# Patient Record
Sex: Female | Born: 1945 | Race: White | Hispanic: No | Marital: Married | Smoking: Light tobacco smoker
Health system: Southern US, Community
[De-identification: ages and names within clinical notes are randomized; demographics above are authoritative.]

## PROBLEM LIST (undated history)

## (undated) DIAGNOSIS — F32A Depression, unspecified: Secondary | ICD-10-CM

## (undated) DIAGNOSIS — F419 Anxiety disorder, unspecified: Secondary | ICD-10-CM

## (undated) DIAGNOSIS — F329 Major depressive disorder, single episode, unspecified: Secondary | ICD-10-CM

## (undated) DIAGNOSIS — I739 Peripheral vascular disease, unspecified: Secondary | ICD-10-CM

## (undated) DIAGNOSIS — I10 Essential (primary) hypertension: Secondary | ICD-10-CM

## (undated) DIAGNOSIS — R011 Cardiac murmur, unspecified: Secondary | ICD-10-CM

## (undated) DIAGNOSIS — I219 Acute myocardial infarction, unspecified: Secondary | ICD-10-CM

## (undated) DIAGNOSIS — E785 Hyperlipidemia, unspecified: Secondary | ICD-10-CM

## (undated) DIAGNOSIS — G5600 Carpal tunnel syndrome, unspecified upper limb: Secondary | ICD-10-CM

## (undated) DIAGNOSIS — M1990 Unspecified osteoarthritis, unspecified site: Secondary | ICD-10-CM

## (undated) DIAGNOSIS — I639 Cerebral infarction, unspecified: Secondary | ICD-10-CM

## (undated) DIAGNOSIS — K219 Gastro-esophageal reflux disease without esophagitis: Secondary | ICD-10-CM

## (undated) DIAGNOSIS — K2270 Barrett's esophagus without dysplasia: Secondary | ICD-10-CM

## (undated) HISTORY — DX: Hyperlipidemia, unspecified: E78.5

## (undated) HISTORY — PX: MULTIPLE TOOTH EXTRACTIONS: SHX2053

## (undated) HISTORY — PX: COLON SURGERY: SHX602

## (undated) HISTORY — DX: Cerebral infarction, unspecified: I63.9

## (undated) HISTORY — PX: TONSILLECTOMY: SUR1361

## (undated) HISTORY — DX: Barrett's esophagus without dysplasia: K22.70

## (undated) HISTORY — PX: CARDIAC CATHETERIZATION: SHX172

## (undated) HISTORY — DX: Cardiac murmur, unspecified: R01.1

## (undated) HISTORY — PX: CARDIAC VALVE REPLACEMENT: SHX585

## (undated) HISTORY — PX: TOTAL ABDOMINAL HYSTERECTOMY: SHX209

## (undated) HISTORY — PX: EYE SURGERY: SHX253

## (undated) HISTORY — PX: EXCISIONAL HEMORRHOIDECTOMY: SHX1541

## (undated) HISTORY — DX: Anxiety disorder, unspecified: F41.9

## (undated) HISTORY — PX: ABDOMINAL HYSTERECTOMY: SHX81

## (undated) HISTORY — DX: Depression, unspecified: F32.A

## (undated) HISTORY — PX: HERNIA REPAIR: SHX51

## (undated) HISTORY — PX: DENTAL SURGERY: SHX609

## (undated) HISTORY — DX: Essential (primary) hypertension: I10

## (undated) HISTORY — PX: CHOLECYSTECTOMY: SHX55

---
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
## 2005-07-22 HISTORY — PX: BREAST BIOPSY: SHX20

## 2006-07-22 HISTORY — PX: CORONARY ANGIOPLASTY WITH STENT PLACEMENT: SHX49

## 2016-05-17 ENCOUNTER — Encounter: Payer: Self-pay | Admitting: Cardiovascular Disease

## 2016-05-27 ENCOUNTER — Encounter: Payer: Self-pay | Admitting: Cardiovascular Disease

## 2019-02-05 ENCOUNTER — Ambulatory Visit: Payer: Federal, State, Local not specified - PPO | Admitting: Family Medicine

## 2019-02-05 ENCOUNTER — Encounter: Payer: Self-pay | Admitting: Family Medicine

## 2019-02-05 ENCOUNTER — Other Ambulatory Visit: Payer: Self-pay

## 2019-02-05 VITALS — BP 146/83 | HR 83 | Temp 98.1°F

## 2019-02-05 DIAGNOSIS — I10 Essential (primary) hypertension: Secondary | ICD-10-CM | POA: Diagnosis not present

## 2019-02-05 DIAGNOSIS — E782 Mixed hyperlipidemia: Secondary | ICD-10-CM | POA: Diagnosis not present

## 2019-02-05 DIAGNOSIS — F419 Anxiety disorder, unspecified: Secondary | ICD-10-CM | POA: Insufficient documentation

## 2019-02-05 DIAGNOSIS — M21619 Bunion of unspecified foot: Secondary | ICD-10-CM

## 2019-02-05 DIAGNOSIS — Z1159 Encounter for screening for other viral diseases: Secondary | ICD-10-CM

## 2019-02-05 DIAGNOSIS — E538 Deficiency of other specified B group vitamins: Secondary | ICD-10-CM

## 2019-02-05 DIAGNOSIS — Z952 Presence of prosthetic heart valve: Secondary | ICD-10-CM

## 2019-02-05 DIAGNOSIS — K589 Irritable bowel syndrome without diarrhea: Secondary | ICD-10-CM

## 2019-02-05 DIAGNOSIS — G5602 Carpal tunnel syndrome, left upper limb: Secondary | ICD-10-CM

## 2019-02-05 DIAGNOSIS — G5600 Carpal tunnel syndrome, unspecified upper limb: Secondary | ICD-10-CM | POA: Insufficient documentation

## 2019-02-05 DIAGNOSIS — E785 Hyperlipidemia, unspecified: Secondary | ICD-10-CM | POA: Insufficient documentation

## 2019-02-05 DIAGNOSIS — M170 Bilateral primary osteoarthritis of knee: Secondary | ICD-10-CM

## 2019-02-05 MED ORDER — OMEPRAZOLE 40 MG PO CPDR: DELAYED_RELEASE_CAPSULE | ORAL | 1 refills | Status: DC

## 2019-02-05 MED ORDER — SERTRALINE HCL 50 MG PO TABS: ORAL_TABLET | ORAL | 3 refills | Status: DC

## 2019-02-05 MED ORDER — ONDANSETRON HCL 4 MG PO TABS: ORAL_TABLET | ORAL | 2 refills | Status: DC

## 2019-02-05 MED ORDER — FENOFIBRATE MICRONIZED 200 MG PO CAPS: 200.0000 mg | ORAL_CAPSULE | Freq: Every day | ORAL | 1 refills | Status: DC

## 2019-02-05 MED ORDER — LISINOPRIL 40 MG PO TABS: 40.0000 mg | ORAL_TABLET | Freq: Every day | ORAL | 3 refills | Status: DC

## 2019-02-05 MED ORDER — ISOSORBIDE MONONITRATE ER 30 MG PO TB24: 30.0000 mg | ORAL_TABLET | Freq: Every day | ORAL | 3 refills | Status: DC

## 2019-02-05 MED ORDER — ROSUVASTATIN CALCIUM 10 MG PO TABS: 10.0000 mg | ORAL_TABLET | Freq: Every day | ORAL | 3 refills | Status: DC

## 2019-02-05 NOTE — Assessment & Plan Note (Addendum)
PMP reviewed. Has not had a Rx for xanax since 11/23/17 for 90 pills. Discussed that we do not prescribe controlled substances on the first visit and that I do not prescribe TID benzos without the clearance of a psychiatrist. She discussed that she didn't want to take xanax any more and had done well with zoloft and citalopram in the past. Will start zoloft and recheck 2 weeks for tolerance. Call with any concerns.

## 2019-02-05 NOTE — Progress Notes (Signed)
BP (!) 146/83   Pulse 83   Temp 98.1 F (36.7 C) (Oral)   SpO2 99%    Subjective:    Patient ID: Tara [name], female    DOB: 10/21/1945, 73 y.o.   MRN: [id number]  HPI: Tara [name] is a 73 y.o. female who presents today to establish care  Chief Complaint  Patient presents with  . Establish Care  . Referral    requesting multiple referrals   ANXIETY/DEPRESSION- has been under a lot of stress recently, her son passed away about 5 years ago. Moved here in 2018. Her husband has fallen 2x and has several fractures. Duration:exacerbated Anxious mood: yes  Excessive worrying: yes Irritability: yes  Sweating: no Nausea: yes Palpitations:no Hyperventilation: no Panic attacks: no Agoraphobia: no  Obscessions/compulsions: yes Depressed mood: yes Depression screen PHQ 2/9 02/05/2019  Decreased Interest 2  Down, Depressed, Hopeless 2  PHQ - 2 Score 4  Altered sleeping 3  Tired, decreased energy 2  Change in appetite 3  Feeling bad or failure about yourself  2  Trouble concentrating 1  Moving slowly or fidgety/restless 2  Suicidal thoughts 0  PHQ-9 Score 17  Difficult doing work/chores Very difficult   GAD 7 : Generalized Anxiety Score 02/05/2019  Nervous, Anxious, on Edge 3  Control/stop worrying 3  Worry too much - different things 3  Trouble relaxing 3  Restless 2  Easily annoyed or irritable 3  Afraid - awful might happen 1  Total GAD 7 Score 18  Anxiety Difficulty Very difficult   Anhedonia: no Weight changes: no Insomnia: yes hard to fall asleep  Hypersomnia: no Fatigue/loss of energy: yes Feelings of worthlessness: yes Feelings of guilt: yes Impaired concentration/indecisiveness: yes Suicidal ideations: no  Crying spells: yes Recent Stressors/Life Changes: yes   Relationship problems: yes   Family stress: yes     Financial stress: no    Job stress: no    Recent death/loss: yes  HYPERTENSION / HYPERLIPIDEMIA- has been off all her medicines for 18  months Satisfied with current treatment? yes Duration of hypertension: chronic 20+ years BP monitoring frequency: not checking BP medication side effects: no Past BP meds: amlodipine, imdur, lisinopril Duration of hyperlipidemia: chronic Cholesterol medication side effects: no Cholesterol supplements: none Past cholesterol medications: atorvastatin, fenofibrate Medication compliance: poor compliance Aspirin: yes Recent stressors: yes Recurrent headaches: yes Visual changes: yes Palpitations: no Dyspnea: no Chest pain: no Lower extremity edema: no Dizzy/lightheaded: no   Active Ambulatory Problems    Diagnosis Date Noted  . Anxiety 02/05/2019  . HTN (hypertension) 02/05/2019  . Hyperlipidemia 02/05/2019  . IBS (irritable bowel syndrome) 02/05/2019  . Arthritis of both knees 02/05/2019  . CTS (carpal tunnel syndrome) 02/05/2019  . History of aortic valve replacement 02/07/2019  . B12 deficiency 02/07/2019   Resolved Ambulatory Problems    Diagnosis Date Noted  . No Resolved Ambulatory Problems   Past Medical History:  Diagnosis Date  . Barrett's syndrome   . Depression   . Heart murmur   . Hypertension    Past Surgical History:  Procedure Laterality Date  . ABDOMINAL HYSTERECTOMY    . CARDIAC VALVE REPLACEMENT    . COLON SURGERY    . EYE SURGERY    . HERNIA REPAIR     Outpatient Encounter Medications as of 02/05/2019  Medication Sig  . ALPRAZolam (XANAX) 0.5 MG tablet Take 0.5 mg by mouth 3 (three) times daily.  Marland Kitchen. amLODipine (NORVASC) 10 MG tablet Take 1 tablet by  mouth daily.  Marland Kitchen aspirin EC 325 MG tablet Take 1 tablet by mouth daily.  Marland Kitchen atorvastatin (LIPITOR) 10 MG tablet Take 1 mg by mouth daily.  . fenofibrate micronized (LOFIBRA) 200 MG capsule Take 1 capsule (200 mg total) by mouth daily.  . isosorbide mononitrate (IMDUR) 30 MG 24 hr tablet Take 1 tablet (30 mg total) by mouth daily.  Marland Kitchen lisinopril (ZESTRIL) 40 MG tablet Take 1 tablet (40 mg total) by mouth  daily.  Marland Kitchen loperamide (IMODIUM) 2 MG capsule Take 2 mg by mouth as needed for diarrhea or loose stools.  Marland Kitchen omeprazole (PRILOSEC) 40 MG capsule TK ONE C PO BID  . ondansetron (ZOFRAN) 4 MG tablet TK 1 T PO  Q 6 H PRN  . [DISCONTINUED] amoxicillin-clavulanate (AUGMENTIN) 875-125 MG tablet TK 1 T PO Q 12 H  . [DISCONTINUED] fenofibrate micronized (LOFIBRA) 200 MG capsule Take 200 mg by mouth daily.  . [DISCONTINUED] isosorbide mononitrate (IMDUR) 30 MG 24 hr tablet TK 1 T PO BID IN THE MORNING.  . [DISCONTINUED] lisinopril (ZESTRIL) 40 MG tablet Take 1 tablet by mouth 2 (two) times daily.  . [DISCONTINUED] omeprazole (PRILOSEC) 40 MG capsule TK ONE C PO BID  . [DISCONTINUED] ondansetron (ZOFRAN) 4 MG tablet TK 1 T PO  Q 6 H PRN  . rosuvastatin (CRESTOR) 10 MG tablet Take 1 tablet (10 mg total) by mouth daily.  . sertraline (ZOLOFT) 50 MG tablet 1 tab daily for 1 week, then increase to 2 tabs daily   No facility-administered encounter medications on file as of 02/05/2019.    No Known Allergies  Social History   Socioeconomic History  . Marital status: Not on file    Spouse name: Not on file  . Number of children: Not on file  . Years of education: Not on file  . Highest education level: Not on file  Occupational History  . Not on file  Social Needs  . Financial resource strain: Not on file  . Food insecurity    Worry: Not on file    Inability: Not on file  . Transportation needs    Medical: Not on file    Non-medical: Not on file  Tobacco Use  . Smoking status: Light Tobacco Smoker  . Smokeless tobacco: Never Used  Substance and Sexual Activity  . Alcohol use: Never    Frequency: Never  . Drug use: Never  . Sexual activity: Not on file  Lifestyle  . Physical activity    Days per week: Not on file    Minutes per session: Not on file  . Stress: Not on file  Relationships  . Social Musician on phone: Not on file    Gets together: Not on file    Attends religious  service: Not on file    Active member of club or organization: Not on file    Attends meetings of clubs or organizations: Not on file    Relationship status: Not on file  Other Topics Concern  . Not on file  Social History Narrative  . Not on file   Family History  Adopted: Yes  Family history unknown: Yes   Review of Systems  Constitutional: Negative.   HENT: Negative.   Respiratory: Negative.   Cardiovascular: Negative.   Gastrointestinal: Positive for abdominal pain and diarrhea. Negative for abdominal distention, anal bleeding, blood in stool, constipation, nausea, rectal pain and vomiting.  Genitourinary: Negative.   Musculoskeletal: Positive for arthralgias and myalgias.  Negative for back pain, gait problem, joint swelling, neck pain and neck stiffness.  Skin: Negative.   Neurological: Positive for numbness. Negative for dizziness, tremors, seizures, syncope, facial asymmetry, speech difficulty, weakness, light-headedness and headaches.  Hematological: Negative.   Psychiatric/Behavioral: Negative for agitation, behavioral problems, confusion, decreased concentration, dysphoric mood, hallucinations, self-injury, sleep disturbance and suicidal ideas. The patient is nervous/anxious. The patient is not hyperactive.     Per HPI unless specifically indicated above     Objective:    BP (!) 146/83   Pulse 83   Temp 98.1 F (36.7 C) (Oral)   SpO2 99%   Wt Readings from Last 3 Encounters:  No data found for Wt    Physical Exam Vitals signs and nursing note reviewed.  Constitutional:      General: She is not in acute distress.    Appearance: Normal appearance. She is not ill-appearing, toxic-appearing or diaphoretic.  HENT:     Head: Normocephalic and atraumatic.     Right Ear: External ear normal.     Left Ear: External ear normal.     Nose: Nose normal.     Mouth/Throat:     Mouth: Mucous membranes are moist.     Pharynx: Oropharynx is clear.  Eyes:     General: No  scleral icterus.       Right eye: No discharge.        Left eye: No discharge.     Extraocular Movements: Extraocular movements intact.     Conjunctiva/sclera: Conjunctivae normal.     Pupils: Pupils are equal, round, and reactive to light.  Neck:     Musculoskeletal: Normal range of motion and neck supple.  Cardiovascular:     Rate and Rhythm: Normal rate and regular rhythm.     Pulses: Normal pulses.     Heart sounds: Normal heart sounds. No murmur. No friction rub. No gallop.   Pulmonary:     Effort: Pulmonary effort is normal. No respiratory distress.     Breath sounds: Normal breath sounds. No stridor. No wheezing, rhonchi or rales.  Chest:     Chest wall: No tenderness.  Musculoskeletal: Normal range of motion.        General: Swelling (R knee with effusion) present.  Skin:    General: Skin is warm and dry.     Capillary Refill: Capillary refill takes less than 2 seconds.     Coloration: Skin is not jaundiced or pale.     Findings: No bruising, erythema, lesion or rash.  Neurological:     General: No focal deficit present.     Mental Status: She is alert and oriented to person, place, and time. Mental status is at baseline.  Psychiatric:        Mood and Affect: Mood normal.        Behavior: Behavior normal.        Thought Content: Thought content normal.        Judgment: Judgment normal.     Results for orders placed or performed in visit on 02/05/19  Microscopic Examination   BLD  Result Value Ref Range   WBC, UA 0-5 0 - 5 /hpf   RBC None seen 0 - 2 /hpf   Epithelial Cells (non renal) >10 (H) 0 - 10 /hpf   Renal Epithel, UA 0-10 (A) None seen /hpf   Casts Present (A) None seen /lpf   Cast Type Hyaline casts N/A   Crystals Present (A) N/A   Crystal  Type Calcium Oxalate N/A   Mucus, UA Present Not Estab.   Bacteria, UA Many (A) None seen/Few  Bayer DCA Hb A1c Waived  Result Value Ref Range   HB A1C (BAYER DCA - WAIVED) 6.3 <7.0 %  CBC with  Differential/Platelet  Result Value Ref Range   WBC 6.2 3.4 - 10.8 x10E3/uL   RBC 5.85 (H) 3.77 - 5.28 x10E6/uL   Hemoglobin 16.4 (H) 11.1 - 15.9 g/dL   Hematocrit 84.648.2 (H) 96.234.0 - 46.6 %   MCV 82 79 - 97 fL   MCH 28.0 26.6 - 33.0 pg   MCHC 34.0 31.5 - 35.7 g/dL   RDW 95.214.2 84.111.7 - 32.415.4 %   Platelets 170 150 - 450 x10E3/uL   Neutrophils 61 Not Estab. %   Lymphs 29 Not Estab. %   Monocytes 9 Not Estab. %   Eos 1 Not Estab. %   Basos 0 Not Estab. %   Neutrophils Absolute 3.8 1.4 - 7.0 x10E3/uL   Lymphocytes Absolute 1.8 0.7 - 3.1 x10E3/uL   Monocytes Absolute 0.5 0.1 - 0.9 x10E3/uL   EOS (ABSOLUTE) 0.1 0.0 - 0.4 x10E3/uL   Basophils Absolute 0.0 0.0 - 0.2 x10E3/uL   Immature Granulocytes 0 Not Estab. %   Immature Grans (Abs) 0.0 0.0 - 0.1 x10E3/uL  Comprehensive metabolic panel  Result Value Ref Range   Glucose 115 (H) 65 - 99 mg/dL   BUN 27 8 - 27 mg/dL   Creatinine, Ser 4.010.89 0.57 - 1.00 mg/dL   GFR calc non Af Amer 66 >59 mL/min/1.73   GFR calc Af Amer 76 >59 mL/min/1.73   BUN/Creatinine Ratio 30 (H) 12 - 28   Sodium 143 134 - 144 mmol/L   Potassium 3.9 3.5 - 5.2 mmol/L   Chloride 106 96 - 106 mmol/L   CO2 23 20 - 29 mmol/L   Calcium 10.3 8.7 - 10.3 mg/dL   Total Protein 6.5 6.0 - 8.5 g/dL   Albumin 4.4 3.8 - 4.8 g/dL   Globulin, Total 2.1 1.5 - 4.5 g/dL   Albumin/Globulin Ratio 2.1 1.2 - 2.2   Bilirubin Total 0.6 0.0 - 1.2 mg/dL   Alkaline Phosphatase 119 (H) 39 - 117 IU/L   AST 20 0 - 40 IU/L   ALT 19 0 - 32 IU/L  Lipid Panel w/o Chol/HDL Ratio  Result Value Ref Range   Cholesterol, Total 217 (H) 100 - 199 mg/dL   Triglycerides 027199 (H) 0 - 149 mg/dL   HDL 40 >25>39 mg/dL   VLDL Cholesterol Cal 40 5 - 40 mg/dL   LDL Calculated 366137 (H) 0 - 99 mg/dL  Microalbumin, Urine Waived  Result Value Ref Range   Microalb, Ur Waived 150 (H) 0 - 19 mg/L   Creatinine, Urine Waived 300 10 - 300 mg/dL   Microalb/Creat Ratio 30-300 (H) <30 mg/g  TSH  Result Value Ref Range   TSH  2.380 0.450 - 4.500 uIU/mL  UA/M w/rflx Culture, Routine   Specimen: Blood   BLD  Result Value Ref Range   Specific Gravity, UA >1.030 (H) 1.005 - 1.030   pH, UA 5.5 5.0 - 7.5   Color, UA Yellow Yellow   Appearance Ur Cloudy (A) Clear   Leukocytes,UA Negative Negative   Protein,UA 3+ (A) Negative/Trace   Glucose, UA Negative Negative   Ketones, UA 1+ (A) Negative   RBC, UA Negative Negative   Bilirubin, UA Negative Negative   Urobilinogen, Ur 1.0 0.2 - 1.0 mg/dL  Nitrite, UA Negative Negative   Microscopic Examination See below:   Hepatitis C Antibody  Result Value Ref Range   Hep C Virus Ab <0.1 0.0 - 0.9 s/co ratio  B12  Result Value Ref Range   Vitamin B-12 167 (L) 232 - 1,245 pg/mL      Assessment & Plan:   Problem List Items Addressed This Visit      Cardiovascular and Mediastinum   HTN (hypertension)    Has been only on her lisinopril (80mg  daily). Will restart her imdur and cut her lisinopril in 1/2 given her borderline blood pressure today (30mg  imdur and 40mg  lisinopril daily) Will recheck BP in 2 weeks and adjust medicine as needed. Checking labs today.      Relevant Medications   atorvastatin (LIPITOR) 10 MG tablet   aspirin EC 325 MG tablet   amLODipine (NORVASC) 10 MG tablet   lisinopril (ZESTRIL) 40 MG tablet   isosorbide mononitrate (IMDUR) 30 MG 24 hr tablet   fenofibrate micronized (LOFIBRA) 200 MG capsule   rosuvastatin (CRESTOR) 10 MG tablet   Other Relevant Orders   Bayer DCA Hb A1c Waived (Completed)   CBC with Differential/Platelet (Completed)   Comprehensive metabolic panel (Completed)   Microalbumin, Urine Waived (Completed)   TSH (Completed)   UA/M w/rflx Culture, Routine (Completed)   Ambulatory referral to Cardiology     Digestive   IBS (irritable bowel syndrome)    Stable on current regimen. Would like to establish with GI down here. Referral generated today.      Relevant Medications   loperamide (IMODIUM) 2 MG capsule    omeprazole (PRILOSEC) 40 MG capsule   ondansetron (ZOFRAN) 4 MG tablet   Other Relevant Orders   Ambulatory referral to Gastroenterology     Nervous and Auditory   CTS (carpal tunnel syndrome)    Chronic. Was supposed to have surgery in [place] before she moved. Will get her into see ortho. Await their input. Referral generated today.      Relevant Medications   ALPRAZolam (XANAX) 0.5 MG tablet   sertraline (ZOLOFT) 50 MG tablet     Musculoskeletal and Integument   Arthritis of both knees    R knee has been swollen and painful. She would like to get it drained. Appointment for orthopedics arranged. Referral generated. Call with any concerns.       Relevant Medications   aspirin EC 325 MG tablet   Other Relevant Orders   Ambulatory referral to Orthopedic Surgery   Ambulatory referral to Rheumatology     Other   Anxiety - Primary    PMP reviewed. Has not had a Rx for xanax since 11/23/17 for 90 pills. Discussed that we do not prescribe controlled substances on the first visit and that I do not prescribe TID benzos without the clearance of a psychiatrist. She discussed that she didn't want to take xanax any more and had done well with zoloft and citalopram in the past. Will start zoloft and recheck 2 weeks for tolerance. Call with any concerns.       Relevant Medications   ALPRAZolam (XANAX) 0.5 MG tablet   sertraline (ZOLOFT) 50 MG tablet   Other Relevant Orders   Comprehensive metabolic panel (Completed)   TSH (Completed)   Hyperlipidemia    Rechecking labs and restarting crestor as atorvastatin was making her achey. Call with any concerns. Continue to monitor. Call with any concerns.       Relevant Medications   atorvastatin (LIPITOR) 10  MG tablet   aspirin EC 325 MG tablet   amLODipine (NORVASC) 10 MG tablet   lisinopril (ZESTRIL) 40 MG tablet   isosorbide mononitrate (IMDUR) 30 MG 24 hr tablet   fenofibrate micronized (LOFIBRA) 200 MG capsule   rosuvastatin (CRESTOR) 10  MG tablet   Other Relevant Orders   Bayer DCA Hb A1c Waived (Completed)   Comprehensive metabolic panel (Completed)   Lipid Panel w/o Chol/HDL Ratio (Completed)   Ambulatory referral to Cardiology   History of aortic valve replacement    Bioprosthetic valve. Would like to get back into see cardiology. Referral generated today.       Relevant Orders   Ambulatory referral to Cardiology   B12 deficiency    Will check labs. Await results. Treat as needed.       Relevant Orders   B12 (Completed)    Other Visit Diagnoses    Need for hepatitis C screening test       Relevant Orders   Hepatitis C Antibody (Completed)   Bunion       Would like to get back in with podiatry. Referral generated today.   Relevant Orders   Ambulatory referral to Podiatry       Follow up plan: Return in about 2 weeks (around 02/19/2019).

## 2019-02-06 LAB — COMPREHENSIVE METABOLIC PANEL
ALT: 19 IU/L (ref 0–32)
AST: 20 IU/L (ref 0–40)
Albumin/Globulin Ratio: 2.1 (ref 1.2–2.2)
Albumin: 4.4 g/dL (ref 3.8–4.8)
Alkaline Phosphatase: 119 IU/L — ABNORMAL HIGH (ref 39–117)
BUN/Creatinine Ratio: 30 — ABNORMAL HIGH (ref 12–28)
BUN: 27 mg/dL (ref 8–27)
Bilirubin Total: 0.6 mg/dL (ref 0.0–1.2)
CO2: 23 mmol/L (ref 20–29)
Calcium: 10.3 mg/dL (ref 8.7–10.3)
Chloride: 106 mmol/L (ref 96–106)
Creatinine, Ser: 0.89 mg/dL (ref 0.57–1.00)
GFR calc Af Amer: 76 mL/min/{1.73_m2} (ref >59)
GFR calc non Af Amer: 66 mL/min/{1.73_m2} (ref >59)
Globulin, Total: 2.1 g/dL (ref 1.5–4.5)
Glucose: 115 mg/dL — ABNORMAL HIGH (ref 65–99)
Potassium: 3.9 mmol/L (ref 3.5–5.2)
Sodium: 143 mmol/L (ref 134–144)
Total Protein: 6.5 g/dL (ref 6.0–8.5)

## 2019-02-06 LAB — UA/M W/RFLX CULTURE, ROUTINE
Bilirubin, UA: NEGATIVE
Glucose, UA: NEGATIVE
Leukocytes,UA: NEGATIVE
Nitrite, UA: NEGATIVE
RBC, UA: NEGATIVE
Specific Gravity, UA: >1.03 — ABNORMAL HIGH (ref 1.005–1.030)
Urobilinogen, Ur: 1 mg/dL (ref 0.2–1.0)
pH, UA: 5.5 (ref 5.0–7.5)

## 2019-02-06 LAB — MICROSCOPIC EXAMINATION
Epithelial Cells (non renal): >10 /hpf — ABNORMAL HIGH (ref 0–10)
RBC, Urine: NONE SEEN /hpf (ref 0–2)

## 2019-02-06 LAB — CBC WITH DIFFERENTIAL/PLATELET
Basophils Absolute: 0 10*3/uL (ref 0.0–0.2)
Basos: 0 %
EOS (ABSOLUTE): 0.1 10*3/uL (ref 0.0–0.4)
Eos: 1 %
Hematocrit: 48.2 % — ABNORMAL HIGH (ref 34.0–46.6)
Hemoglobin: 16.4 g/dL — ABNORMAL HIGH (ref 11.1–15.9)
Immature Grans (Abs): 0 10*3/uL (ref 0.0–0.1)
Immature Granulocytes: 0 %
Lymphocytes Absolute: 1.8 10*3/uL (ref 0.7–3.1)
Lymphs: 29 %
MCH: 28 pg (ref 26.6–33.0)
MCHC: 34 g/dL (ref 31.5–35.7)
MCV: 82 fL (ref 79–97)
Monocytes Absolute: 0.5 10*3/uL (ref 0.1–0.9)
Monocytes: 9 %
Neutrophils Absolute: 3.8 10*3/uL (ref 1.4–7.0)
Neutrophils: 61 %
Platelets: 170 10*3/uL (ref 150–450)
RBC: 5.85 x10E6/uL — ABNORMAL HIGH (ref 3.77–5.28)
RDW: 14.2 % (ref 11.7–15.4)
WBC: 6.2 10*3/uL (ref 3.4–10.8)

## 2019-02-06 LAB — LIPID PANEL W/O CHOL/HDL RATIO
Cholesterol, Total: 217 mg/dL — ABNORMAL HIGH (ref 100–199)
HDL: 40 mg/dL (ref >39)
LDL Calculated: 137 mg/dL — ABNORMAL HIGH (ref 0–99)
Triglycerides: 199 mg/dL — ABNORMAL HIGH (ref 0–149)
VLDL Cholesterol Cal: 40 mg/dL (ref 5–40)

## 2019-02-06 LAB — HEPATITIS C ANTIBODY: Hep C Virus Ab: <0.1 s/co ratio (ref 0.0–0.9)

## 2019-02-06 LAB — TSH: TSH: 2.38 u[IU]/mL (ref 0.450–4.500)

## 2019-02-06 LAB — BAYER DCA HB A1C WAIVED: HB A1C (BAYER DCA - WAIVED): 6.3 % (ref <7.0)

## 2019-02-06 LAB — VITAMIN B12: Vitamin B-12: 167 pg/mL — ABNORMAL LOW (ref 232–1245)

## 2019-02-06 LAB — MICROALBUMIN, URINE WAIVED
Creatinine, Urine Waived: 300 mg/dL (ref 10–300)
Microalb, Ur Waived: 150 mg/L — ABNORMAL HIGH (ref 0–19)

## 2019-02-07 ENCOUNTER — Encounter: Payer: Self-pay | Admitting: Family Medicine

## 2019-02-07 DIAGNOSIS — E538 Deficiency of other specified B group vitamins: Secondary | ICD-10-CM | POA: Insufficient documentation

## 2019-02-07 DIAGNOSIS — Z952 Presence of prosthetic heart valve: Secondary | ICD-10-CM | POA: Insufficient documentation

## 2019-02-07 NOTE — Assessment & Plan Note (Signed)
R knee has been swollen and painful. She would like to get it drained. Appointment for orthopedics arranged. Referral generated. Call with any concerns.

## 2019-02-07 NOTE — Assessment & Plan Note (Signed)
Will check labs. Await results. Treat as needed.  

## 2019-02-07 NOTE — Assessment & Plan Note (Signed)
Has been only on her lisinopril (80mg  daily). Will restart her imdur and cut her lisinopril in 1/2 given her borderline blood pressure today (30mg  imdur and 40mg  lisinopril daily) Will recheck BP in 2 weeks and adjust medicine as needed. Checking labs today.

## 2019-02-07 NOTE — Assessment & Plan Note (Signed)
Rechecking labs and restarting crestor as atorvastatin was making her achey. Call with any concerns. Continue to monitor. Call with any concerns.

## 2019-02-07 NOTE — Assessment & Plan Note (Signed)
Stable on current regimen. Would like to establish with GI down here. Referral generated today.

## 2019-02-07 NOTE — Assessment & Plan Note (Signed)
Bioprosthetic valve. Would like to get back into see cardiology. Referral generated today.

## 2019-02-07 NOTE — Assessment & Plan Note (Signed)
Chronic. Was supposed to have surgery in [place] before she moved. Will get her into see ortho. Await their input. Referral generated today.

## 2019-02-08 ENCOUNTER — Encounter: Payer: Self-pay | Admitting: Family Medicine

## 2019-02-08 ENCOUNTER — Other Ambulatory Visit: Payer: Self-pay | Admitting: Family Medicine

## 2019-02-08 MED ORDER — "INSULIN SYRINGE 27G X 1/2"" 1 ML MISC": 1.0000 | 3 refills | Status: DC

## 2019-02-08 MED ORDER — CYANOCOBALAMIN 1000 MCG/ML IJ SOLN: 1000.0000 ug | INTRAMUSCULAR | 12 refills | Status: DC

## 2019-02-09 ENCOUNTER — Telehealth: Payer: Self-pay | Admitting: Cardiovascular Disease

## 2019-02-09 ENCOUNTER — Telehealth: Payer: Self-pay | Admitting: Family Medicine

## 2019-02-09 DIAGNOSIS — Z01419 Encounter for gynecological examination (general) (routine) without abnormal findings: Secondary | ICD-10-CM

## 2019-02-09 DIAGNOSIS — Z1239 Encounter for other screening for malignant neoplasm of breast: Secondary | ICD-10-CM

## 2019-02-09 NOTE — Telephone Encounter (Signed)
Called to relay message to patient.  Patient states she has been taking medication BID and wants to keep doing so. Wants RX changed.   Pt verbalized that she did not have any issues for the GYN, however she wants a PAP done. Pt states birth mother had Ovarian cancer and wants this checked out before she develops a tumor and it's too late.   Did not relay message about mammogram as pt had become hostile and upset and had stopped listening to message.   Advised I would send message back to provider.

## 2019-02-09 NOTE — Telephone Encounter (Signed)
Will change Rx at follow up in 10 days if necessary.   Referral to GYN placed.

## 2019-02-09 NOTE — Telephone Encounter (Signed)
Virtual Visit Pre-Appointment Phone Call  "(Name), I am calling you today to discuss your upcoming appointment. We are currently trying to limit exposure to the virus that causes COVID-19 by seeing patients at home rather than in the office."  1. "What is the BEST phone number to call the day of the visit?" - include this in appointment notes  2. Do you have or have access to (through a family member/friend) a smartphone with video capability that we can use for your visit?" a. If yes - list this number in appt notes as cell (if different from BEST phone #) and list the appointment type as a VIDEO visit in appointment notes b. If no - list the appointment type as a PHONE visit in appointment notes  3. Confirm consent - "In the setting of the current Covid19 crisis, you are scheduled for a (phone or video) visit with your provider on (date) at (time).  Just as we do with many in-office visits, in order for you to participate in this visit, we must obtain consent.  If you'd like, I can send this to your mychart (if signed up) or email for you to review.  Otherwise, I can obtain your verbal consent now.  All virtual visits are billed to your insurance company just like a normal visit would be.  By agreeing to a virtual visit, we'd like you to understand that the technology does not allow for your provider to perform an examination, and thus may limit your provider's ability to fully assess your condition. If your provider identifies any concerns that need to be evaluated in person, we will make arrangements to do so.  Finally, though the technology is pretty good, we cannot assure that it will always work on either your or our end, and in the setting of a video visit, we may have to convert it to a phone-only visit.  In either situation, we cannot ensure that we have a secure connection.  Are you willing to proceed?" STAFF: Did the patient verbally acknowledge consent to telehealth visit? Document  YES/NO here: YES  4. Advise patient to be prepared - "Two hours prior to your appointment, go ahead and check your blood pressure, pulse, oxygen saturation, and your weight (if you have the equipment to check those) and write them all down. When your visit starts, your provider will ask you for this information. If you have an Apple Watch or Kardia device, please plan to have heart rate information ready on the day of your appointment. Please have a pen and paper handy nearby the day of the visit as well."  5. Give patient instructions for MyChart download to smartphone OR Doximity/[email or web address] as below if video visit (depending on what platform provider is using)  6. Inform patient they will receive a phone call 15 minutes prior to their appointment time (may be from unknown caller ID) so they should be prepared to answer    TELEPHONE CALL NOTE  Tara [name] has been deemed a candidate for a follow-up tele-health visit to limit community exposure during the Covid-19 pandemic. I spoke with the patient via phone to ensure availability of phone/video source, confirm preferred email & phone number, and discuss instructions and expectations.  I reminded Tara [name] to be prepared with any vital sign and/or heart rhythm information that could potentially be obtained via home monitoring, at the time of her visit. I reminded Tara [name] to expect a phone call prior to her visit.  Clarisse Gouge 02/09/2019 1:44 PM   INSTRUCTIONS FOR DOWNLOADING THE MYCHART APP TO SMARTPHONE  - The patient must first make sure to have activated MyChart and know their login information - If Apple, go to [employer] and type in MyChart in the search bar and download the app. If Android, ask patient to go to [employer] and type in [place] in the search bar and download the app. The app is free but as with any other app downloads, their phone may require them to verify saved payment information or Apple/Android  password.  - The patient will need to then log into the app with their MyChart username and password, and select [facility] as their healthcare provider to link the account. When it is time for your visit, go to the MyChart app, find appointments, and click Begin Video Visit. Be sure to Select Allow for your device to access the Microphone and Camera for your visit. You will then be connected, and your provider will be with you shortly.  **If they have any issues connecting, or need assistance please contact MyChart service desk [phone])**  **If using a computer, in order to ensure the best quality for their visit they will need to use either of the following Internet Browsers: [employer], or Google Chrome**  IF USING DOXIMITY or DOXY.ME - The patient will receive a link just prior to their visit by text.     FULL LENGTH CONSENT FOR TELE-HEALTH VISIT   I hereby voluntarily request, consent and authorize [facility] and its employed or contracted physicians, physician assistants, nurse practitioners or other licensed health care professionals (the Practitioner), to provide me with telemedicine health care services (the Services") as deemed necessary by the treating Practitioner. I acknowledge and consent to receive the Services by the Practitioner via telemedicine. I understand that the telemedicine visit will involve communicating with the Practitioner through live audiovisual communication technology and the disclosure of certain medical information by electronic transmission. I acknowledge that I have been given the opportunity to request an in-person assessment or other available alternative prior to the telemedicine visit and am voluntarily participating in the telemedicine visit.  I understand that I have the right to withhold or withdraw my consent to the use of telemedicine in the course of my care at any time, without affecting my right to future care or treatment,  and that the Practitioner or I may terminate the telemedicine visit at any time. I understand that I have the right to inspect all information obtained and/or recorded in the course of the telemedicine visit and may receive copies of available information for a reasonable fee.  I understand that some of the potential risks of receiving the Services via telemedicine include:   Delay or interruption in medical evaluation due to technological equipment failure or disruption;  Information transmitted may not be sufficient (e.g. poor resolution of images) to allow for appropriate medical decision making by the Practitioner; and/or   In rare instances, security protocols could fail, causing a breach of personal health information.  Furthermore, I acknowledge that it is my responsibility to provide information about my medical history, conditions and care that is complete and accurate to the best of my ability. I acknowledge that Practitioner's advice, recommendations, and/or decision may be based on factors not within their control, such as incomplete or inaccurate data provided by me or distortions of diagnostic images or specimens that may result from electronic transmissions. I understand that the  practice of medicine is not an Chief Strategy Officer and that Practitioner makes no warranties or guarantees regarding treatment outcomes. I acknowledge that I will receive a copy of this consent concurrently upon execution via email to the email address I last provided but may also request a printed copy by calling the office of [facility].    I understand that my insurance will be billed for this visit.   I have read or had this consent read to me.  I understand the contents of this consent, which adequately explains the benefits and risks of the Services being provided via telemedicine.   I have been provided ample opportunity to ask questions regarding this consent and the Services and have had my questions  answered to my satisfaction.  I give my informed consent for the services to be provided through the use of telemedicine in my medical care  By participating in this telemedicine visit I agree to the above.

## 2019-02-09 NOTE — Telephone Encounter (Signed)
Her blood pressure was not that high when we saw her. I want her to take 1x a day until I see her again. Then we can adjust it based on what her blood pressure is.   She does not need pelvic exams any more based on her age and her hysterectomy. If she is having any issues and would like to see a GYN, I'm happy to refer her, but she does not need to see one for routine care.   Her order for her mammogram is in. She can call [facility] at [facility]  Address: [address] [place], [place], [place] [address]  Phone: [phone]  to get it scheduled.   This is not a high priority message. Changed to routine priority

## 2019-02-09 NOTE — Telephone Encounter (Signed)
Patient says the isosorbide mononitrate (IMDUR) 30 MG 24 hr tablet is supposed to be written to take it 2 times daily and not one time daily.  She also needs a referral for GYN because she still has her ovaries after her hysterectomy.   She also would like an order for mammo.  Lastly she would like Dr. Wynetta Emery to be aware that she's going to pain management starting tomorrow 02/10/19.

## 2019-02-19 ENCOUNTER — Encounter: Payer: Self-pay | Admitting: Family Medicine

## 2019-02-19 ENCOUNTER — Ambulatory Visit: Payer: Federal, State, Local not specified - PPO | Admitting: Family Medicine

## 2019-02-19 ENCOUNTER — Other Ambulatory Visit: Payer: Self-pay

## 2019-02-19 DIAGNOSIS — I10 Essential (primary) hypertension: Secondary | ICD-10-CM

## 2019-02-19 DIAGNOSIS — F419 Anxiety disorder, unspecified: Secondary | ICD-10-CM

## 2019-02-19 MED ORDER — ISOSORBIDE MONONITRATE ER 30 MG PO TB24: 30.0000 mg | ORAL_TABLET | Freq: Two times a day (BID) | ORAL | 3 refills | Status: DC

## 2019-02-19 MED ORDER — ALPRAZOLAM 0.5 MG PO TABS: 0.5000 mg | ORAL_TABLET | Freq: Every day | ORAL | 0 refills | Status: DC | PRN

## 2019-02-19 MED ORDER — LISINOPRIL 40 MG PO TABS: 40.0000 mg | ORAL_TABLET | Freq: Two times a day (BID) | ORAL | 3 refills | Status: DC

## 2019-02-19 NOTE — Assessment & Plan Note (Signed)
Zoloft hasn't started working yet. Under a lot of stress- will give small amount of xanax to take until her zoloft becomes effective. Recheck 2 weeks. Call with any concerns.

## 2019-02-19 NOTE — Progress Notes (Signed)
BP (!) 175/80 (BP Location: Left Arm, Cuff Size: Normal)   Pulse 89   Temp 98.3 F (36.8 C)   Ht 5' 2.8" (1.595 m)   Wt 182 lb (82.6 kg)   SpO2 96%   BMI 32.45 kg/m    Subjective:    Patient ID: Tara [name], female    DOB: 01/27/1946, 73 y.o.   MRN: [id number]  HPI: Tara [name] is a 73 y.o. female  No chief complaint on file.  HYPERTENSION- Has been taking 2 of her lisinopril (80mg ) and 1 of her imdur (30mg ) had previously been on 80mg  lisinopril, 60mg  imdur and 10mg  amlodipine, had been off all her medicine for a couple of months until she established care 2 weeks ago. Hypertension status: exacerbated  Satisfied with current treatment? no Duration of hypertension: chronic BP monitoring frequency:  a few times a week BP range: 130s/80s BP medication side effects:  no Medication compliance: good compliance Previous BP meds: 80mg  lisinopril, 60mg  imdur and 10mg  amlodipine Aspirin: yes Recurrent headaches: no Visual changes: no Palpitations: no Dyspnea: no Chest pain: no Lower extremity edema: no Dizzy/lightheaded: no  ANXIETY/STRESS- husband needs another surgery. Has been very anxious Duration:stable Anxious mood: yes  Excessive worrying: yes Irritability: yes  Sweating: no Nausea: no Palpitations:no Hyperventilation: no Panic attacks: no Agoraphobia: no  Obscessions/compulsions: no Depressed mood: yes Depression screen PHQ 2/9 02/05/2019  Decreased Interest 2  Down, Depressed, Hopeless 2  PHQ - 2 Score 4  Altered sleeping 3  Tired, decreased energy 2  Change in appetite 3  Feeling bad or failure about yourself  2  Trouble concentrating 1  Moving slowly or fidgety/restless 2  Suicidal thoughts 0  PHQ-9 Score 17  Difficult doing work/chores Very difficult   Anhedonia: no Weight changes: no Insomnia: yes hard to fall asleep  Hypersomnia: no Fatigue/loss of energy: yes Feelings of worthlessness: yes Feelings of guilt: yes Impaired  concentration/indecisiveness: yes Suicidal ideations: no  Crying spells: yes Recent Stressors/Life Changes: yes   Relationship problems: yes   Family stress: yes     Financial stress: yes    Job stress: no    Recent death/loss: no  Relevant past medical, surgical, family and social history reviewed and updated as indicated. Interim medical history since our last visit reviewed. Allergies and medications reviewed and updated.  Review of Systems  Constitutional: Negative.   Respiratory: Negative.   Cardiovascular: Negative.   Gastrointestinal: Negative.   Musculoskeletal: Negative.   Skin: Negative.   Neurological: Negative.   Psychiatric/Behavioral: Positive for dysphoric mood. Negative for agitation, behavioral problems, confusion, decreased concentration, hallucinations, self-injury, sleep disturbance and suicidal ideas. The patient is nervous/anxious. The patient is not hyperactive.     Per HPI unless specifically indicated above     Objective:    BP (!) 175/80 (BP Location: Left Arm, Cuff Size: Normal)   Pulse 89   Temp 98.3 F (36.8 C)   Ht 5' 2.8" (1.595 m)   Wt 182 lb (82.6 kg)   SpO2 96%   BMI 32.45 kg/m   Wt Readings from Last 3 Encounters:  02/19/19 182 lb (82.6 kg)    Physical Exam Vitals signs and nursing note reviewed.  Constitutional:      General: She is not in acute distress.    Appearance: Normal appearance. She is not ill-appearing, toxic-appearing or diaphoretic.  HENT:     Head: Normocephalic and atraumatic.     Right Ear: External ear normal.     Left  Ear: External ear normal.     Nose: Nose normal.     Mouth/Throat:     Mouth: Mucous membranes are moist.     Pharynx: Oropharynx is clear.  Eyes:     General: No scleral icterus.       Right eye: No discharge.        Left eye: No discharge.     Extraocular Movements: Extraocular movements intact.     Conjunctiva/sclera: Conjunctivae normal.     Pupils: Pupils are equal, round, and reactive  to light.  Neck:     Musculoskeletal: Normal range of motion and neck supple.  Cardiovascular:     Rate and Rhythm: Normal rate and regular rhythm.     Pulses: Normal pulses.     Heart sounds: Normal heart sounds. No murmur. No friction rub. No gallop.   Pulmonary:     Effort: Pulmonary effort is normal. No respiratory distress.     Breath sounds: Normal breath sounds. No stridor. No wheezing, rhonchi or rales.  Chest:     Chest wall: No tenderness.  Musculoskeletal: Normal range of motion.  Skin:    General: Skin is warm and dry.     Capillary Refill: Capillary refill takes less than 2 seconds.     Coloration: Skin is not jaundiced or pale.     Findings: No bruising, erythema, lesion or rash.  Neurological:     General: No focal deficit present.     Mental Status: She is alert and oriented to person, place, and time. Mental status is at baseline.  Psychiatric:        Mood and Affect: Mood normal.        Behavior: Behavior normal.        Thought Content: Thought content normal.        Judgment: Judgment normal.     Results for orders placed or performed in visit on 02/05/19  Microscopic Examination   BLD  Result Value Ref Range   WBC, UA 0-5 0 - 5 /hpf   RBC None seen 0 - 2 /hpf   Epithelial Cells (non renal) >10 (H) 0 - 10 /hpf   Renal Epithel, UA 0-10 (A) None seen /hpf   Casts Present (A) None seen /lpf   Cast Type Hyaline casts N/A   Crystals Present (A) N/A   Crystal Type Calcium Oxalate N/A   Mucus, UA Present Not Estab.   Bacteria, UA Many (A) None seen/Few  Bayer DCA Hb A1c Waived  Result Value Ref Range   HB A1C (BAYER DCA - WAIVED) 6.3 <7.0 %  CBC with Differential/Platelet  Result Value Ref Range   WBC 6.2 3.4 - 10.8 x10E3/uL   RBC 5.85 (H) 3.77 - 5.28 x10E6/uL   Hemoglobin 16.4 (H) 11.1 - 15.9 g/dL   Hematocrit 16.148.2 (H) 09.634.0 - 46.6 %   MCV 82 79 - 97 fL   MCH 28.0 26.6 - 33.0 pg   MCHC 34.0 31.5 - 35.7 g/dL   RDW 04.514.2 40.911.7 - 81.115.4 %   Platelets 170  150 - 450 x10E3/uL   Neutrophils 61 Not Estab. %   Lymphs 29 Not Estab. %   Monocytes 9 Not Estab. %   Eos 1 Not Estab. %   Basos 0 Not Estab. %   Neutrophils Absolute 3.8 1.4 - 7.0 x10E3/uL   Lymphocytes Absolute 1.8 0.7 - 3.1 x10E3/uL   Monocytes Absolute 0.5 0.1 - 0.9 x10E3/uL   EOS (ABSOLUTE) 0.1 0.0 -  0.4 x10E3/uL   Basophils Absolute 0.0 0.0 - 0.2 x10E3/uL   Immature Granulocytes 0 Not Estab. %   Immature Grans (Abs) 0.0 0.0 - 0.1 x10E3/uL  Comprehensive metabolic panel  Result Value Ref Range   Glucose 115 (H) 65 - 99 mg/dL   BUN 27 8 - 27 mg/dL   Creatinine, Ser 0.89 0.57 - 1.00 mg/dL   GFR calc non Af Amer 66 >59 mL/min/1.73   GFR calc Af Amer 76 >59 mL/min/1.73   BUN/Creatinine Ratio 30 (H) 12 - 28   Sodium 143 134 - 144 mmol/L   Potassium 3.9 3.5 - 5.2 mmol/L   Chloride 106 96 - 106 mmol/L   CO2 23 20 - 29 mmol/L   Calcium 10.3 8.7 - 10.3 mg/dL   Total Protein 6.5 6.0 - 8.5 g/dL   Albumin 4.4 3.8 - 4.8 g/dL   Globulin, Total 2.1 1.5 - 4.5 g/dL   Albumin/Globulin Ratio 2.1 1.2 - 2.2   Bilirubin Total 0.6 0.0 - 1.2 mg/dL   Alkaline Phosphatase 119 (H) 39 - 117 IU/L   AST 20 0 - 40 IU/L   ALT 19 0 - 32 IU/L  Lipid Panel w/o Chol/HDL Ratio  Result Value Ref Range   Cholesterol, Total 217 (H) 100 - 199 mg/dL   Triglycerides 199 (H) 0 - 149 mg/dL   HDL 40 >39 mg/dL   VLDL Cholesterol Cal 40 5 - 40 mg/dL   LDL Calculated 137 (H) 0 - 99 mg/dL  Microalbumin, Urine Waived  Result Value Ref Range   Microalb, Ur Waived 150 (H) 0 - 19 mg/L   Creatinine, Urine Waived 300 10 - 300 mg/dL   Microalb/Creat Ratio 30-300 (H) <30 mg/g  TSH  Result Value Ref Range   TSH 2.380 0.450 - 4.500 uIU/mL  UA/M w/rflx Culture, Routine   Specimen: Blood   BLD  Result Value Ref Range   Specific Gravity, UA >1.030 (H) 1.005 - 1.030   pH, UA 5.5 5.0 - 7.5   Color, UA Yellow Yellow   Appearance Ur Cloudy (A) Clear   Leukocytes,UA Negative Negative   Protein,UA 3+ (A) Negative/Trace    Glucose, UA Negative Negative   Ketones, UA 1+ (A) Negative   RBC, UA Negative Negative   Bilirubin, UA Negative Negative   Urobilinogen, Ur 1.0 0.2 - 1.0 mg/dL   Nitrite, UA Negative Negative   Microscopic Examination See below:   Hepatitis C Antibody  Result Value Ref Range   Hep C Virus Ab <0.1 0.0 - 0.9 s/co ratio  B12  Result Value Ref Range   Vitamin B-12 167 (L) 232 - 1,245 pg/mL      Assessment & Plan:   Problem List Items Addressed This Visit      Cardiovascular and Mediastinum   HTN (hypertension)    Very elevated today. Will continue 80mg  lisinopril daily and restart imdur 60mg  daily. Recheck 2-4 weeks. Call with any concerns. May need to add her amlodipine back in.       Relevant Medications   lisinopril (ZESTRIL) 40 MG tablet   isosorbide mononitrate (IMDUR) 30 MG 24 hr tablet     Other   Anxiety    Zoloft hasn't started working yet. Under a lot of stress- will give small amount of xanax to take until her zoloft becomes effective. Recheck 2 weeks. Call with any concerns.       Relevant Medications   ALPRAZolam (XANAX) 0.5 MG tablet  Follow up plan: Return in about 2 weeks (around 03/05/2019) for Recheck mood and BP.

## 2019-02-19 NOTE — Assessment & Plan Note (Addendum)
Very elevated today. Will continue 80mg  lisinopril daily and restart imdur 60mg  daily. Recheck 2-4 weeks. Call with any concerns. May need to add her amlodipine back in.

## 2019-02-22 ENCOUNTER — Encounter: Payer: Self-pay | Admitting: Gastroenterology

## 2019-02-22 ENCOUNTER — Other Ambulatory Visit: Payer: Self-pay

## 2019-02-22 ENCOUNTER — Ambulatory Visit (INDEPENDENT_AMBULATORY_CARE_PROVIDER_SITE_OTHER): Payer: Federal, State, Local not specified - PPO | Admitting: Gastroenterology

## 2019-02-22 VITALS — BP 161/84 | HR 80 | Temp 98.2°F | Ht 62.8 in | Wt 182.2 lb

## 2019-02-22 DIAGNOSIS — K449 Diaphragmatic hernia without obstruction or gangrene: Secondary | ICD-10-CM

## 2019-02-22 DIAGNOSIS — K2270 Barrett's esophagus without dysplasia: Secondary | ICD-10-CM

## 2019-02-22 DIAGNOSIS — Z9889 Other specified postprocedural states: Secondary | ICD-10-CM

## 2019-02-22 NOTE — Progress Notes (Addendum)
Tara [name] [address]  [place], [place] [address]  Main: [phone]  Fax: [phone]   Gastroenterology Consultation  Referring Provider:     Valerie Roys, DO Primary Care Physician:  Valerie Roys, DO Reason for Consultation:     Referred for IBS        HPI:    Chief Complaint  Patient presents with  . New Patient (Initial Visit)    Referred for IBS    Tara [name] is a 73 y.o. y/o female referred for consultation & management  by Dr. Wynetta Emery, Megan P, DO.  Patient is referral states that she is referred for IBS, but she states she requested an appointment due to need for upper endoscopy due to history of Barrett's.  Patient has had to take care of her husband due to multiple falls and fractures recently and states due to this her own care has been delayed.  Spends a lot of time talking about her husband.  She states she had an upper endoscopy in [place] in 2017 and was told she has Barrett's.  Has been on omeprazole twice daily since then.  She states she is due for another upper endoscopy.  Also reports a hiatal hernia.  Feels after eating or drinking food or water seems to sit in the epigastric region.  Denies any dysphagia.  Also feels that she has some nausea after eating at times.  We do not have any of these previous records.  Also describes history of colon surgery in 2016 due to diverticulitis with abscess that required drainage and surgery.  Reports that since the surgery she has intermittently noted an umbilical discharge that is foul-smelling but this has not been evaluated.  She states this occurs only when she inserts her finger into the umbilicus.  No fever or chills.  Describes having a colonoscopy before her surgery but none since then.  No family history of colon cancer.  Addendum: Records obtained, see scanned records Upper endoscopy April 2018 for history of Barrett's and worsening recent reflux Short segment Barrett's  reported.  5 cm hiatal hernia.  Significant amount of retained food and fluid within the stomach, which made visualization of approximately 75% of the gastric mucosa very difficult.  Random biopsies done for H. pylori.  Duodenum within normal limits.  No H. pylori and gastric biopsies.  Esophagus biopsy showing squamous mucosa and chronically inflamed gastric type mucosa.  No intestinal metaplasia, negative for Barrett's.  Upper endoscopy December 2016 Grade 2 reflux esophagitis with erosions, erythema and edema.  Circumferential biopsies taken for Barrett's.  Significant erosions in the prepyloric area.  Duodenal bulb, mild erythema.  6 cm hiatal hernia.  Omeprazole 40 mg daily was prescribed.  Duodenal biopsies were normal.  Gastric biopsies showed focal intestinal metaplasia, negative for dysplasia. Collected pylori.  Esophagus biopsies reported specialized columnar mucosa displaying intestinal metaplasia consistent with Barrett's.  Small focus of intestinal metaplasia identified.  Seen in clinic in December 2016 by Dr. Sandi Mealy for weight loss and abdominal pain.  Notes reported history of recent partial colon and small bowel resection for perforated diverticulitis and abscess in December 2016.  Postop wound infection weight loss since surgery and sharp pains in the supraumbilical area associated with a bulge.  Notes reported that patient had a CT scan 4 weeks prior to the clinic visit and it was normal.  EGD was scheduled as above.  The pain was thought to be due to adhesions or  a ventral hernia, "though I cannot feel a discrete hernia presently."  As per their note she had a colonoscopy in 2016.  Procedure report not available  Past Medical History:  Diagnosis Date  . Anxiety   . Barrett's syndrome   . Depression   . Heart murmur   . Hyperlipidemia   . Hypertension     Past Surgical History:  Procedure Laterality Date  . ABDOMINAL HYSTERECTOMY    . CARDIAC VALVE REPLACEMENT    . COLON  SURGERY    . EYE SURGERY    . HERNIA REPAIR      Prior to Admission medications   Medication Sig Start Date End Date Taking? Authorizing Provider  ALPRAZolam Duanne Moron) 0.5 MG tablet Take 1 tablet (0.5 mg total) by mouth daily as needed for anxiety. 02/19/19  Yes Johnson, Megan P, DO  amLODipine (NORVASC) 10 MG tablet Take 1 tablet by mouth daily. 11/23/17  Yes [provider]  aspirin EC 325 MG tablet Take 1 tablet by mouth daily.   Yes [provider]  atorvastatin (LIPITOR) 10 MG tablet Take 1 mg by mouth daily.   Yes [provider]  cyanocobalamin (,VITAMIN B-12,) 1000 MCG/ML injection Inject 1 mL (1,000 mcg total) into the muscle every 30 (thirty) days. 02/08/19  Yes Johnson, Megan P, DO  fenofibrate micronized (LOFIBRA) 200 MG capsule Take 1 capsule (200 mg total) by mouth daily. 02/05/19  Yes Johnson, Megan P, DO  Insulin Syringe 27G X 1/2" 1 ML MISC 1 each by Does not apply route every 30 (thirty) days. 02/08/19  Yes Johnson, Megan P, DO  isosorbide mononitrate (IMDUR) 30 MG 24 hr tablet Take 1 tablet (30 mg total) by mouth 2 (two) times a day. 02/19/19  Yes Johnson, Megan P, DO  lisinopril (ZESTRIL) 40 MG tablet Take 1 tablet (40 mg total) by mouth 2 (two) times a day. 02/19/19  Yes Johnson, Megan P, DO  loperamide (IMODIUM) 2 MG capsule Take 2 mg by mouth as needed for diarrhea or loose stools.   Yes [provider]  omeprazole (PRILOSEC) 40 MG capsule TK ONE C PO BID 02/05/19  Yes Johnson, Megan P, DO  ondansetron (ZOFRAN) 4 MG tablet TK 1 T PO  Q 6 H PRN 02/05/19  Yes Johnson, Megan P, DO  oxyCODONE-acetaminophen (PERCOCET/ROXICET) 5-325 MG tablet TK 1 T PO Q 6 H PRF PAIN. MAY FILL 02/10/19 02/10/19  Yes [provider]  rosuvastatin (CRESTOR) 10 MG tablet Take 1 tablet (10 mg total) by mouth daily. 02/05/19  Yes Johnson, Megan P, DO  sertraline (ZOLOFT) 50 MG tablet 1 tab daily for 1 week, then increase to 2 tabs daily 02/05/19  Yes Johnson, Megan P, DO   HYDROcodone-acetaminophen (NORCO/VICODIN) 5-325 MG tablet TK 1 T PO Q 6 TO 8 H 02/08/19   [provider]    Family History  Adopted: Yes  Family history unknown: Yes     Social History   Tobacco Use  . Smoking status: Light Tobacco Smoker  . Smokeless tobacco: Never Used  Substance Use Topics  . Alcohol use: Never    Frequency: Never  . Drug use: Never    Allergies as of 02/22/2019  . (No Known Allergies)    Review of Systems:    All systems reviewed and negative except where noted in HPI.   Physical Exam:  BP (!) 161/84   Pulse 80   Temp 98.2 F (36.8 C) (Oral)   Ht 5' 2.8" (  1.595 m)   Wt 182 lb 3.2 oz (82.6 kg)   BMI 32.48 kg/m  No LMP recorded. Psych:  Alert and cooperative. Normal mood and affect. General:   Alert,  Well-developed, well-nourished, pleasant and cooperative in NAD Head:  Normocephalic and atraumatic. Eyes:  Sclera clear, no icterus.   Conjunctiva pink. Ears:  Normal auditory acuity. Nose:  No deformity, discharge, or lesions. Mouth:  No deformity or lesions,oropharynx pink & moist. Neck:  Supple; no masses or thyromegaly. Abdomen:  Normal bowel sounds.  No bruits.  Soft, non-tender and non-distended without masses, hepatosplenomegaly or hernias noted.  No guarding or rebound tenderness.    Msk:  Symmetrical without gross deformities. Good, equal movement & strength bilaterally. Pulses:  Normal pulses noted. Extremities:  No clubbing or edema.  No cyanosis. Neurologic:  Alert and oriented x3;  grossly normal neurologically. Skin:  Intact without significant lesions or rashes. No jaundice. Lymph Nodes:  No significant cervical adenopathy. Psych:  Alert and cooperative. Normal mood and affect.   Labs: CBC    Component Value Date/Time   WBC 6.2 02/05/2019 1619   RBC 5.85 (H) 02/05/2019 1619   HGB 16.4 (H) 02/05/2019 1619   HCT 48.2 (H) 02/05/2019 1619   PLT 170 02/05/2019 1619   MCV 82 02/05/2019 1619   MCH 28.0 02/05/2019 1619    MCHC 34.0 02/05/2019 1619   RDW 14.2 02/05/2019 1619   LYMPHSABS 1.8 02/05/2019 1619   EOSABS 0.1 02/05/2019 1619   BASOSABS 0.0 02/05/2019 1619   CMP     Component Value Date/Time   NA 143 02/05/2019 1619   K 3.9 02/05/2019 1619   CL 106 02/05/2019 1619   CO2 23 02/05/2019 1619   GLUCOSE 115 (H) 02/05/2019 1619   BUN 27 02/05/2019 1619   CREATININE 0.89 02/05/2019 1619   CALCIUM 10.3 02/05/2019 1619   PROT 6.5 02/05/2019 1619   ALBUMIN 4.4 02/05/2019 1619   AST 20 02/05/2019 1619   ALT 19 02/05/2019 1619   ALKPHOS 119 (H) 02/05/2019 1619   BILITOT 0.6 02/05/2019 1619   GFRNONAA 66 02/05/2019 1619   GFRAA 76 02/05/2019 1619    Imaging Studies: No results found.  Assessment and Plan:   Baley Shands is a 73 y.o. y/o female has been referred for history of IBS, with history of colon surgery in 2016 for diverticulitis with abscess, history of Barrett's esophagus  We do not have any of her previous records  Patient has signed record release which we will be sending to [place] to get her previous records  EGD appropriate for Barrett's surveillance Colonoscopy appropriate for CRC screening  We also discussed referral to surgery due to umbilical drainage intermittently.  Patient is agreeable.  She would also like to discuss hiatal hernia repair with the surgeon, and she can do so at the surgery referral as well.  She would like a referral to a surgeon outside of [facility], therefore will refer to [facility].  Alk phos was mildly elevated on last labs in July 2020.  Likely nonspecific.  We will repeat with next clinic visit    Dr Tara [name]  Speech recognition software was used to dictate the above note.

## 2019-02-23 ENCOUNTER — Other Ambulatory Visit: Payer: Self-pay

## 2019-02-23 ENCOUNTER — Ambulatory Visit: Payer: Federal, State, Local not specified - PPO | Admitting: Podiatry

## 2019-02-23 ENCOUNTER — Telehealth: Payer: Self-pay

## 2019-02-23 NOTE — Telephone Encounter (Signed)
LMTCO. (pt needs to schedule her colonoscopy/EGD and wanted this scheduled in [place] per Dr. Bonna Gains, MD opening in [facility] on 03/16/2019). Pt very anxious on visit yesterday and did not want to wait and schedule this). The EGD will be for Barrett's and colonoscopy for screening.  Also referral sent to [facility] for hiatla hernia and umbilical drainage-hx of colon surgery.

## 2019-03-08 ENCOUNTER — Encounter: Payer: Federal, State, Local not specified - PPO | Admitting: Obstetrics and Gynecology

## 2019-03-10 ENCOUNTER — Encounter: Payer: Federal, State, Local not specified - PPO | Admitting: Obstetrics and Gynecology

## 2019-03-12 ENCOUNTER — Ambulatory Visit: Payer: Federal, State, Local not specified - PPO | Admitting: Gastroenterology

## 2019-03-16 ENCOUNTER — Telehealth: Payer: Federal, State, Local not specified - PPO | Admitting: Cardiovascular Disease

## 2019-03-19 ENCOUNTER — Encounter: Payer: Self-pay | Admitting: Family Medicine

## 2019-03-19 ENCOUNTER — Ambulatory Visit (INDEPENDENT_AMBULATORY_CARE_PROVIDER_SITE_OTHER): Payer: Federal, State, Local not specified - PPO | Admitting: Family Medicine

## 2019-03-19 ENCOUNTER — Other Ambulatory Visit: Payer: Self-pay | Admitting: Family Medicine

## 2019-03-19 ENCOUNTER — Other Ambulatory Visit: Payer: Self-pay

## 2019-03-19 VITALS — BP 133/78 | HR 80 | Temp 99.4°F | Ht 62.8 in | Wt 181.0 lb

## 2019-03-19 DIAGNOSIS — M25552 Pain in left hip: Secondary | ICD-10-CM

## 2019-03-19 DIAGNOSIS — I10 Essential (primary) hypertension: Secondary | ICD-10-CM | POA: Diagnosis not present

## 2019-03-19 DIAGNOSIS — M25551 Pain in right hip: Secondary | ICD-10-CM | POA: Diagnosis not present

## 2019-03-19 DIAGNOSIS — F419 Anxiety disorder, unspecified: Secondary | ICD-10-CM

## 2019-03-19 MED ORDER — HYDROCHLOROTHIAZIDE 25 MG PO TABS: 25.0000 mg | ORAL_TABLET | Freq: Every day | ORAL | 1 refills | Status: DC

## 2019-03-19 MED ORDER — LOSARTAN POTASSIUM 100 MG PO TABS: 100.0000 mg | ORAL_TABLET | Freq: Every day | ORAL | 1 refills | Status: DC

## 2019-03-19 MED ORDER — LIDOCAINE-PRILOCAINE 2.5-2.5 % EX CREA: 1.0000 "application " | TOPICAL_CREAM | CUTANEOUS | 6 refills | Status: DC | PRN

## 2019-03-19 MED ORDER — SERTRALINE HCL 100 MG PO TABS: 100.0000 mg | ORAL_TABLET | Freq: Two times a day (BID) | ORAL | 3 refills | Status: DC

## 2019-03-19 NOTE — Progress Notes (Signed)
BP 133/78   Pulse 80   Temp 99.4 F (37.4 C) (Oral)   Ht 5' 2.8" (1.595 m)   Wt 181 lb (82.1 kg)   SpO2 96%   BMI 32.27 kg/m    Subjective:    Patient ID: Tara [name], female    DOB: 11/22/1945, 73 y.o.   MRN: [id number]  HPI: Tara [name] is a 73 y.o. female  Chief Complaint  Patient presents with  . Hypertension    f/u  . Anxiety    pt states that sertraline is not working well   ANXIETY/STRESS Duration:uncontrolled Anxious mood: yes  Excessive worrying: yes Irritability: yes  Sweating: no Nausea: yes Palpitations:no Hyperventilation: no Panic attacks: no Agoraphobia: no  Obscessions/compulsions: no Depressed mood: yes Depression screen PHQ 2/9 02/05/2019  Decreased Interest 2  Down, Depressed, Hopeless 2  PHQ - 2 Score 4  Altered sleeping 3  Tired, decreased energy 2  Change in appetite 3  Feeling bad or failure about yourself  2  Trouble concentrating 1  Moving slowly or fidgety/restless 2  Suicidal thoughts 0  PHQ-9 Score 17  Difficult doing work/chores Very difficult   Anhedonia: no Weight changes: no Insomnia: yes hard to fall asleep  Hypersomnia: no Fatigue/loss of energy: yes Feelings of worthlessness: no Feelings of guilt: no Impaired concentration/indecisiveness: no Suicidal ideations: no  Crying spells: no Recent Stressors/Life Changes: yes   Relationship problems: yes   Family stress: yes     Financial stress: no    Job stress: no    Recent death/loss: no  HYPERTENSION- has been taking her amlodipine for about 2 weeks about a month ago, but hasn't been taking it recently, she is concerned that she might be having insomnia from her lisinopril- she would like to come off of it and try something else. Hypertension status: controlled  Satisfied with current treatment? yes Duration of hypertension: chronic BP monitoring frequency:  not checking BP medication side effects:  no Medication compliance: excellent compliance Previous BP  meds:imdur, lisinopril, amlodipine Aspirin: yes Recurrent headaches: no Visual changes: no Palpitations: no Dyspnea: no Chest pain: no Lower extremity edema: no Dizzy/lightheaded: no  Relevant past medical, surgical, family and social history reviewed and updated as indicated. Interim medical history since our last visit reviewed. Allergies and medications reviewed and updated.  Review of Systems  Constitutional: Negative.   Respiratory: Negative.   Cardiovascular: Negative.   Musculoskeletal: Positive for arthralgias, back pain and myalgias. Negative for gait problem, joint swelling, neck pain and neck stiffness.  Skin: Negative.   Neurological: Negative.   Psychiatric/Behavioral: Positive for dysphoric mood and sleep disturbance. Negative for agitation, behavioral problems, confusion, decreased concentration, hallucinations, self-injury and suicidal ideas. The patient is nervous/anxious. The patient is not hyperactive.     Per HPI unless specifically indicated above     Objective:    BP 133/78   Pulse 80   Temp 99.4 F (37.4 C) (Oral)   Ht 5' 2.8" (1.595 m)   Wt 181 lb (82.1 kg)   SpO2 96%   BMI 32.27 kg/m   Wt Readings from Last 3 Encounters:  03/19/19 181 lb (82.1 kg)  02/22/19 182 lb 3.2 oz (82.6 kg)  02/19/19 182 lb (82.6 kg)    Physical Exam Vitals signs and nursing note reviewed.  Constitutional:      General: She is not in acute distress.    Appearance: Normal appearance. She is not ill-appearing, toxic-appearing or diaphoretic.  HENT:     Head:  Normocephalic and atraumatic.     Right Ear: External ear normal.     Left Ear: External ear normal.     Nose: Nose normal.     Mouth/Throat:     Mouth: Mucous membranes are moist.     Pharynx: Oropharynx is clear.  Eyes:     General: No scleral icterus.       Right eye: No discharge.        Left eye: No discharge.     Extraocular Movements: Extraocular movements intact.     Conjunctiva/sclera: Conjunctivae  normal.     Pupils: Pupils are equal, round, and reactive to light.  Neck:     Musculoskeletal: Normal range of motion and neck supple.  Cardiovascular:     Rate and Rhythm: Normal rate and regular rhythm.     Pulses: Normal pulses.     Heart sounds: Normal heart sounds. No murmur. No friction rub. No gallop.   Pulmonary:     Effort: Pulmonary effort is normal. No respiratory distress.     Breath sounds: Normal breath sounds. No stridor. No wheezing, rhonchi or rales.  Chest:     Chest wall: No tenderness.  Musculoskeletal: Normal range of motion.  Skin:    General: Skin is warm and dry.     Capillary Refill: Capillary refill takes less than 2 seconds.     Coloration: Skin is not jaundiced or pale.     Findings: No bruising, erythema, lesion or rash.  Neurological:     General: No focal deficit present.     Mental Status: She is alert and oriented to person, place, and time. Mental status is at baseline.  Psychiatric:        Mood and Affect: Mood normal.        Behavior: Behavior normal.        Thought Content: Thought content normal.        Judgment: Judgment normal.     Results for orders placed or performed in visit on 53/61/44  Basic metabolic panel  Result Value Ref Range   Glucose 157 (H) 65 - 99 mg/dL   BUN 27 8 - 27 mg/dL   Creatinine, Ser 0.87 0.57 - 1.00 mg/dL   GFR calc non Af Amer 67 >59 mL/min/1.73   GFR calc Af Amer 78 >59 mL/min/1.73   BUN/Creatinine Ratio 31 (H) 12 - 28   Sodium 141 134 - 144 mmol/L   Potassium 4.3 3.5 - 5.2 mmol/L   Chloride 103 96 - 106 mmol/L   CO2 27 20 - 29 mmol/L   Calcium 9.4 8.7 - 10.3 mg/dL      Assessment & Plan:   Problem List Items Addressed This Visit      Cardiovascular and Mediastinum   HTN (hypertension) - Primary    Under good control. She would like to come off her lisinopril. Currently on 80mg  lisinopril- will change to 100mg  losartan and add in HCTZ to help with some swelling. Recheck 2-4 weeks. Call with any  concerns. May need to add amlodipine back in to try to keep BP under good control. BMP drawn today.      Relevant Orders   Basic metabolic panel (Completed)     Other   Anxiety    Not under good control. Has been very irritable. Will increase her zoloft to 200mg  daily and recheck 1 month. Call with any concerns.       Relevant Medications   sertraline (ZOLOFT) 100 MG tablet  Other Visit Diagnoses    Pain of both hip joints       Interested in OMM evaluation. Will get her back in for dedicated OMM evaluation as able.        Follow up plan: Return in about 4 weeks (around 04/16/2019).

## 2019-03-20 ENCOUNTER — Encounter: Payer: Self-pay | Admitting: Family Medicine

## 2019-03-20 LAB — BASIC METABOLIC PANEL
BUN/Creatinine Ratio: 31 — ABNORMAL HIGH (ref 12–28)
BUN: 27 mg/dL (ref 8–27)
CO2: 27 mmol/L (ref 20–29)
Calcium: 9.4 mg/dL (ref 8.7–10.3)
Chloride: 103 mmol/L (ref 96–106)
Creatinine, Ser: 0.87 mg/dL (ref 0.57–1.00)
GFR calc Af Amer: 78 mL/min/{1.73_m2} (ref >59)
GFR calc non Af Amer: 67 mL/min/{1.73_m2} (ref >59)
Glucose: 157 mg/dL — ABNORMAL HIGH (ref 65–99)
Potassium: 4.3 mmol/L (ref 3.5–5.2)
Sodium: 141 mmol/L (ref 134–144)

## 2019-03-20 NOTE — Assessment & Plan Note (Signed)
Not under good control. Has been very irritable. Will increase her zoloft to 200mg  daily and recheck 1 month. Call with any concerns.

## 2019-03-20 NOTE — Assessment & Plan Note (Signed)
Under good control. She would like to come off her lisinopril. Currently on 80mg  lisinopril- will change to 100mg  losartan and add in HCTZ to help with some swelling. Recheck 2-4 weeks. Call with any concerns. May need to add amlodipine back in to try to keep BP under good control. BMP drawn today.

## 2019-03-22 ENCOUNTER — Encounter: Payer: Self-pay | Admitting: Family Medicine

## 2019-03-24 ENCOUNTER — Telehealth: Payer: Self-pay

## 2019-03-24 ENCOUNTER — Other Ambulatory Visit: Payer: Self-pay

## 2019-03-24 DIAGNOSIS — K2270 Barrett's esophagus without dysplasia: Secondary | ICD-10-CM

## 2019-03-24 DIAGNOSIS — Z1211 Encounter for screening for malignant neoplasm of colon: Secondary | ICD-10-CM

## 2019-03-24 MED ORDER — NA SULFATE-K SULFATE-MG SULF 17.5-3.13-1.6 GM/177ML PO SOLN: 354.0000 mL | Freq: Once | ORAL | 0 refills | Status: AC

## 2019-03-24 NOTE — Telephone Encounter (Signed)
Patient is ready to scheduled both procedures. Patient scheduled for 04/09/2019. Emailed the Instructions to patient

## 2019-03-24 NOTE — Telephone Encounter (Signed)
-----   Message from Virgel Manifold, MD sent at 03/24/2019 12:33 PM EDT ----- Can you call this patient to see if she is ready to schedule her EGD and colonoscopy like we  discussed in her clinic notes

## 2019-04-06 ENCOUNTER — Other Ambulatory Visit: Payer: Self-pay

## 2019-04-06 ENCOUNTER — Other Ambulatory Visit
Admission: RE | Admit: 2019-04-06 | Discharge: 2019-04-06 | Disposition: A | Discharge status: Home / Self Care | Payer: Federal, State, Local not specified - PPO | Source: Ambulatory Visit | Attending: Gastroenterology | Admitting: Gastroenterology

## 2019-04-06 ENCOUNTER — Telehealth: Payer: Self-pay | Admitting: Gastroenterology

## 2019-04-06 DIAGNOSIS — Z01812 Encounter for preprocedural laboratory examination: Secondary | ICD-10-CM | POA: Insufficient documentation

## 2019-04-06 DIAGNOSIS — Z20828 Contact with and (suspected) exposure to other viral communicable diseases: Secondary | ICD-10-CM | POA: Diagnosis not present

## 2019-04-06 NOTE — Telephone Encounter (Signed)
Pt left vm to r/s her procedure until next month

## 2019-04-06 NOTE — Telephone Encounter (Signed)
PT  Left another vm she does want to keep the apt now she found a ride

## 2019-04-06 NOTE — Telephone Encounter (Signed)
Okay I will leave the appointment alone

## 2019-04-07 LAB — SARS CORONAVIRUS 2 (TAT 6-24 HRS): SARS Coronavirus 2: NEGATIVE

## 2019-04-08 ENCOUNTER — Encounter: Payer: Self-pay | Admitting: *Deleted

## 2019-04-09 ENCOUNTER — Ambulatory Visit: Payer: Federal, State, Local not specified - PPO | Admitting: Registered Nurse

## 2019-04-09 ENCOUNTER — Encounter
Admission: RE | Disposition: A | Discharge status: Home / Self Care | Payer: Self-pay | Source: Home / Self Care | Attending: Gastroenterology

## 2019-04-09 ENCOUNTER — Ambulatory Visit
Admission: RE | Admit: 2019-04-09 | Discharge: 2019-04-09 | Disposition: A | Discharge status: Home / Self Care | Payer: Federal, State, Local not specified - PPO | Attending: Gastroenterology | Admitting: Gastroenterology

## 2019-04-09 ENCOUNTER — Encounter: Payer: Self-pay | Admitting: *Deleted

## 2019-04-09 DIAGNOSIS — R1310 Dysphagia, unspecified: Secondary | ICD-10-CM | POA: Diagnosis present

## 2019-04-09 DIAGNOSIS — Z1211 Encounter for screening for malignant neoplasm of colon: Secondary | ICD-10-CM | POA: Insufficient documentation

## 2019-04-09 DIAGNOSIS — K5730 Diverticulosis of large intestine without perforation or abscess without bleeding: Secondary | ICD-10-CM | POA: Diagnosis not present

## 2019-04-09 DIAGNOSIS — E785 Hyperlipidemia, unspecified: Secondary | ICD-10-CM | POA: Diagnosis not present

## 2019-04-09 DIAGNOSIS — K3189 Other diseases of stomach and duodenum: Secondary | ICD-10-CM | POA: Insufficient documentation

## 2019-04-09 DIAGNOSIS — K5520 Angiodysplasia of colon without hemorrhage: Secondary | ICD-10-CM

## 2019-04-09 DIAGNOSIS — Z794 Long term (current) use of insulin: Secondary | ICD-10-CM | POA: Diagnosis not present

## 2019-04-09 DIAGNOSIS — I252 Old myocardial infarction: Secondary | ICD-10-CM | POA: Insufficient documentation

## 2019-04-09 DIAGNOSIS — Z980 Intestinal bypass and anastomosis status: Secondary | ICD-10-CM | POA: Insufficient documentation

## 2019-04-09 DIAGNOSIS — Z7982 Long term (current) use of aspirin: Secondary | ICD-10-CM | POA: Diagnosis not present

## 2019-04-09 DIAGNOSIS — K259 Gastric ulcer, unspecified as acute or chronic, without hemorrhage or perforation: Secondary | ICD-10-CM | POA: Diagnosis not present

## 2019-04-09 DIAGNOSIS — K2270 Barrett's esophagus without dysplasia: Secondary | ICD-10-CM

## 2019-04-09 DIAGNOSIS — K210 Gastro-esophageal reflux disease with esophagitis: Secondary | ICD-10-CM | POA: Diagnosis not present

## 2019-04-09 DIAGNOSIS — Z8719 Personal history of other diseases of the digestive system: Secondary | ICD-10-CM | POA: Insufficient documentation

## 2019-04-09 DIAGNOSIS — I10 Essential (primary) hypertension: Secondary | ICD-10-CM | POA: Diagnosis not present

## 2019-04-09 DIAGNOSIS — R109 Unspecified abdominal pain: Secondary | ICD-10-CM | POA: Diagnosis not present

## 2019-04-09 DIAGNOSIS — Z79899 Other long term (current) drug therapy: Secondary | ICD-10-CM | POA: Insufficient documentation

## 2019-04-09 DIAGNOSIS — F419 Anxiety disorder, unspecified: Secondary | ICD-10-CM | POA: Diagnosis not present

## 2019-04-09 DIAGNOSIS — F329 Major depressive disorder, single episode, unspecified: Secondary | ICD-10-CM | POA: Diagnosis not present

## 2019-04-09 HISTORY — DX: Acute myocardial infarction, unspecified: I21.9

## 2019-04-09 HISTORY — PX: ESOPHAGOGASTRODUODENOSCOPY (EGD) WITH PROPOFOL: SHX5813

## 2019-04-09 HISTORY — PX: COLONOSCOPY WITH PROPOFOL: SHX5780

## 2019-04-09 SURGERY — COLONOSCOPY WITH PROPOFOL
Anesthesia: General

## 2019-04-09 MED ORDER — SODIUM CHLORIDE 0.9 % IV SOLN
INTRAVENOUS | Status: DC
  Administered 2019-04-09: 1000 mL via INTRAVENOUS

## 2019-04-09 MED ORDER — PROPOFOL 500 MG/50ML IV EMUL
INTRAVENOUS | Status: DC | PRN
  Administered 2019-04-09: 100 ug/kg/min via INTRAVENOUS

## 2019-04-09 MED ORDER — LIDOCAINE HCL (PF) 2 % IJ SOLN
INTRAMUSCULAR | Status: DC | PRN
  Administered 2019-04-09: 80 mg via INTRADERMAL

## 2019-04-09 MED ORDER — PROPOFOL 10 MG/ML IV BOLUS
INTRAVENOUS | Status: DC | PRN
  Administered 2019-04-09: 90 mg via INTRAVENOUS
  Administered 2019-04-09: 60 mg via INTRAVENOUS

## 2019-04-09 MED ORDER — PROPOFOL 500 MG/50ML IV EMUL
INTRAVENOUS | Status: AC
  Filled 2019-04-09: qty 50

## 2019-04-09 NOTE — Transfer of Care (Signed)
Immediate Anesthesia Transfer of Care Note  Patient: Tara [name]  Procedure(s) Performed: COLONOSCOPY WITH PROPOFOL (N/A ) ESOPHAGOGASTRODUODENOSCOPY (EGD) WITH PROPOFOL (N/A )  Patient Location: PACU  Anesthesia Type:General  Level of Consciousness: awake, alert  and oriented  Airway & Oxygen Therapy: Patient Spontanous Breathing and Patient connected to face mask  Post-op Assessment: Report given to RN and Post -op Vital signs reviewed and stable  Post vital signs: Reviewed and stable  Last Vitals:  Vitals Value Taken Time  BP 147/68 04/09/19 1117  Temp    Pulse 73 04/09/19 1117  Resp 24 04/09/19 1117  SpO2 98 % 04/09/19 1117  Vitals shown include unvalidated device data.  Last Pain:  Vitals:   04/09/19 1110  TempSrc: Tympanic  PainSc:          Complications: No apparent anesthesia complications

## 2019-04-09 NOTE — Op Note (Signed)
[facility] Gastroenterology Patient Name: Tara [name] Procedure Date: 04/09/2019 10:12 AM MRN: [id number] Account #: [id number] Date of Birth: 07/25/1945 Admit Type: Outpatient Age: 73 Room: [facility] ENDO ROOM 2 Gender: Female Note Status: Finalized Procedure:            Colonoscopy Indications:          Screening for colorectal malignant neoplasm Providers:            Varnita B. Bonna Gains MD, MD Medicines:            Monitored Anesthesia Care Complications:        No immediate complications. Procedure:            Pre-Anesthesia Assessment:                       - Prior to the procedure, a History and Physical was                        performed, and patient medications, allergies and                        sensitivities were reviewed. The patient's tolerance of                        previous anesthesia was reviewed.                       - The risks and benefits of the procedure and the                        sedation options and risks were discussed with the                        patient. All questions were answered and informed                        consent was obtained.                       - Patient identification and proposed procedure were                        verified prior to the procedure by the physician, the                        nurse, the anesthetist and the technician. The                        procedure was verified in the pre-procedure area in the                        procedure room in the endoscopy suite.                       - ASA Grade Assessment: II - A patient with mild                        systemic disease.                       - After reviewing the risks and benefits, the patient  was deemed in satisfactory condition to undergo the                        procedure.                       After obtaining informed consent, the colonoscope was                        passed under direct vision. Throughout the  procedure,                        the patient's blood pressure, pulse, and oxygen                        saturations were monitored continuously. The                        Colonoscope was introduced through the anus and                        advanced to the the cecum, identified by appendiceal                        orifice and ileocecal valve. The colonoscopy was                        performed with ease. The patient tolerated the                        procedure well. The quality of the bowel preparation                        was good. Findings:      The perianal and digital rectal examinations were normal.      A single small angioectasia without bleeding was found in the cecum.      There was evidence of a prior surgical anastomosis in the sigmoid colon.       This was patent and was characterized by healthy appearing mucosa. The       anastomosis was traversed.      Multiple diverticula were found in the sigmoid colon.      The exam was otherwise without abnormality.      The rectum, sigmoid colon, descending colon, transverse colon, ascending       colon and cecum appeared normal. Biopsies for histology were taken with       a cold forceps from the entire colon for evaluation of microscopic       colitis.      The retroflexed view of the distal rectum and anal verge was normal and       showed no anal or rectal abnormalities. Impression:           - A single non-bleeding colonic angioectasia.                       - Patent surgical anastomosis, characterized by healthy                        appearing mucosa.                       -  Diverticulosis in the sigmoid colon.                       - The examination was otherwise normal.                       - The rectum, sigmoid colon, descending colon,                        transverse colon, ascending colon and cecum are normal.                        Biopsied.                       - The distal rectum and anal verge are normal  on                        retroflexion view. Recommendation:       - Discharge patient to home.                       - Resume previous diet.                       - Continue present medications.                       - Return to primary care physician as previously                        scheduled.                       - The findings and recommendations were discussed with                        the patient.                       - The findings and recommendations were discussed with                        the patient's family.                       - High fiber diet.                       - In 10 years patient will be above 73 years of age and                        screening after that age, with no previous history of                        colon age, is not recommended.                       - In the future, if patient develops new symptoms such                        as blood per rectum, abdominal pain, weight loss,  altered bowel habits or any other reason for concern,                        patient should discuss this with thier PCP as they may                        need a GI referral at that time or evaluation for need                        for colonoscopy earlier than the recommended screening                        colonoscopy.                       In addition, if patient's family history of colon                        cancer changes (no family history at this time) in the                        future, earlier screening may be indicated and patient                        should discuss this with PCP as well. Procedure Code(s):    --- Professional ---                       [phone], Colonoscopy, flexible; with biopsy, single or                        multiple Diagnosis Code(s):    --- Professional ---                       Z12.11, Encounter for screening for malignant neoplasm                        of colon                       K55.20,  Angiodysplasia of colon without hemorrhage                       Z98.0, Intestinal bypass and anastomosis status                       K57.30, Diverticulosis of large intestine without                        perforation or abscess without bleeding CPT copyright 2019 American Medical Association. All rights reserved. The codes documented in this report are preliminary and upon coder review may  be revised to meet current compliance requirements.  Melodie Bouillon, MD Michel Bickers B. Maximino Greenland MD, MD 04/09/2019 11:21:17 AM This report has been signed electronically. Number of Addenda: 0 Note Initiated On: 04/09/2019 10:12 AM Scope Withdrawal Time: 0 hours 18 minutes 25 seconds  Total Procedure Duration: 0 hours 22 minutes 55 seconds  Estimated Blood Loss: Estimated blood loss: none.      [facility]

## 2019-04-09 NOTE — H&P (Signed)
Tara BouillonVarnita Adriena Manfre, MD [address], [address], [place], [place], [id number] [address], [address], [place], [place], [address] Phone: [phone]  Fax: [phone]  Primary Care Physician:  Dorcas CarrowJohnson, Megan P, DO   Pre-Procedure History & Physical: HPI:  Tara [name] is a 73 y.o. female is here for a colonoscopy and EGD.   Past Medical History:  Diagnosis Date  . Anxiety   . Barrett's syndrome   . Depression   . Heart murmur   . Hyperlipidemia   . Hypertension   . Myocardial infarction [facility](HCC)    08    Past Surgical History:  Procedure Laterality Date  . ABDOMINAL HYSTERECTOMY    . CARDIAC VALVE REPLACEMENT    . COLON SURGERY    . EXCISIONAL HEMORRHOIDECTOMY    . EYE SURGERY    . HERNIA REPAIR      Prior to Admission medications   Medication Sig Start Date End Date Taking? Authorizing Provider  atorvastatin (LIPITOR) 40 MG tablet Take 40 mg by mouth daily.   Yes [provider]  hydrochlorothiazide (HYDRODIURIL) 25 MG tablet TAKE 1 TABLET(25 MG) BY MOUTH DAILY 03/19/19  Yes Johnson, Megan P, DO  lisinopril (ZESTRIL) 40 MG tablet Take 40 mg by mouth daily.   Yes [provider]  ALPRAZolam Prudy Feeler(XANAX) 0.5 MG tablet Take 1 tablet (0.5 mg total) by mouth daily as needed for anxiety. 02/19/19   Johnson, Megan P, DO  aspirin EC 325 MG tablet Take 1 tablet by mouth daily.    [provider]  cyanocobalamin (,VITAMIN B-12,) 1000 MCG/ML injection Inject 1 mL (1,000 mcg total) into the muscle every 30 (thirty) days. 02/08/19   Johnson, Megan P, DO  fenofibrate micronized (LOFIBRA) 200 MG capsule Take 1 capsule (200 mg total) by mouth daily. 02/05/19   Johnson, Megan P, DO  HYDROcodone-acetaminophen (NORCO/VICODIN) 5-325 MG tablet TK 1 T PO Q 6 TO 8 H 02/08/19   [provider]  Insulin Syringe 27G X 1/2" 1 ML MISC 1 each by Does not apply route every 30 (thirty) days. 02/08/19   Johnson, Megan P, DO  isosorbide mononitrate (IMDUR) 30 MG 24 hr  tablet Take 1 tablet (30 mg total) by mouth 2 (two) times a day. 02/19/19   Johnson, Megan P, DO  lidocaine-prilocaine (EMLA) cream Apply 1 application topically as needed. 03/19/19   Olevia PerchesJohnson, Megan P, DO  loperamide (IMODIUM) 2 MG capsule Take 2 mg by mouth as needed for diarrhea or loose stools.    [provider]  losartan (COZAAR) 100 MG tablet TAKE 1 TABLET(100 MG) BY MOUTH DAILY 03/19/19   Laural BenesJohnson, Megan P, DO  omeprazole (PRILOSEC) 40 MG capsule TK ONE C PO BID 02/05/19   Johnson, Megan P, DO  ondansetron (ZOFRAN) 4 MG tablet TK 1 T PO  Q 6 H PRN 02/05/19   Johnson, Megan P, DO  oxyCODONE-acetaminophen (PERCOCET/ROXICET) 5-325 MG tablet TK 1 T PO Q 6 H PRF PAIN. MAY FILL 02/10/19 02/10/19   [provider]  sertraline (ZOLOFT) 100 MG tablet Take 1 tablet (100 mg total) by mouth 2 (two) times daily. 1 tab daily for 1 week, then increase to 2 tabs daily 03/19/19   Olevia PerchesJohnson, Megan P, DO    Allergies as of 03/24/2019  . (No Known Allergies)    Family History  Adopted: Yes  Family history unknown: Yes    Social History   Socioeconomic History  . Marital status: Married    Spouse name: Not  on file  . Number of children: Not on file  . Years of education: Not on file  . Highest education level: Not on file  Occupational History  . Not on file  Social Needs  . Financial resource strain: Not on file  . Food insecurity    Worry: Not on file    Inability: Not on file  . Transportation needs    Medical: Not on file    Non-medical: Not on file  Tobacco Use  . Smoking status: Light Tobacco Smoker  . Smokeless tobacco: Never Used  Substance and Sexual Activity  . Alcohol use: Never    Frequency: Never  . Drug use: Never  . Sexual activity: Not on file  Lifestyle  . Physical activity    Days per week: Not on file    Minutes per session: Not on file  . Stress: Not on file  Relationships  . Social Herbalist on phone: Not on file    Gets together: Not on  file    Attends religious service: Not on file    Active member of club or organization: Not on file    Attends meetings of clubs or organizations: Not on file    Relationship status: Not on file  . Intimate partner violence    Fear of current or ex partner: Not on file    Emotionally abused: Not on file    Physically abused: Not on file    Forced sexual activity: Not on file  Other Topics Concern  . Not on file  Social History Narrative  . Not on file    Review of Systems: See HPI, otherwise negative ROS  Physical Exam: BP (!) 150/89   Pulse 86   Temp (!) 97.5 F (36.4 C) (Tympanic)   Resp 20   Ht 5\' 3"  (1.6 m)   Wt 81.6 kg   SpO2 97%   BMI 31.89 kg/m  General:   Alert,  pleasant and cooperative in NAD Head:  Normocephalic and atraumatic. Neck:  Supple; no masses or thyromegaly. Lungs:  Clear throughout to auscultation, normal respiratory effort.    Heart:  +S1, +S2, Regular rate and rhythm, No edema. Abdomen:  Soft, nontender and nondistended. Normal bowel sounds, without guarding, and without rebound.   Neurologic:  Alert and  oriented x4;  grossly normal neurologically.  Impression/Plan: Tara [name] is here for a colonoscopy to be performed for average risk screening and EGD for history of barretts esophagus and reflux.  Risks, benefits, limitations, and alternatives regarding the procedures have been reviewed with the patient.  Questions have been answered.  All parties agreeable.   Virgel Manifold, MD  04/09/2019, 10:20 AM

## 2019-04-09 NOTE — Anesthesia Post-op Follow-up Note (Signed)
Anesthesia QCDR form completed.        

## 2019-04-09 NOTE — Anesthesia Postprocedure Evaluation (Signed)
Anesthesia Post Note  Patient: Tara [name]  Procedure(s) Performed: COLONOSCOPY WITH PROPOFOL (N/A ) ESOPHAGOGASTRODUODENOSCOPY (EGD) WITH PROPOFOL (N/A )  Patient location during evaluation: Endoscopy Anesthesia Type: General Level of consciousness: awake and alert Pain management: pain level controlled Vital Signs Assessment: post-procedure vital signs reviewed and stable Respiratory status: spontaneous breathing, nonlabored ventilation, respiratory function stable and patient connected to nasal cannula oxygen Cardiovascular status: blood pressure returned to baseline and stable Postop Assessment: no apparent nausea or vomiting Anesthetic complications: no     Last Vitals:  Vitals:   04/09/19 1137 04/09/19 1141  BP: (!) 168/87   Pulse: 73 73  Resp: 15 12  Temp:    SpO2: 95% 96%    Last Pain:  Vitals:   04/09/19 1110  TempSrc: Tympanic  PainSc:                  Brayden Betters S

## 2019-04-09 NOTE — Op Note (Addendum)
[facility] Gastroenterology Patient Name: Malkia Nippert Procedure Date: 04/09/2019 10:13 AM MRN: [id number] Account #: [id number] Date of Birth: 06-23-1946 Admit Type: Outpatient Age: 73 Room: [facility] ENDO ROOM 2 Gender: Female Note Status: Finalized Procedure:            Upper GI endoscopy Indications:          Abdominal pain, Dysphagia, Follow-up of Barrett's                        esophagus Providers:            Dorell Gatlin B. Bonna Gains MD, MD Referring MD:         Valerie Roys (Referring MD) Medicines:            Monitored Anesthesia Care Complications:        No immediate complications. Procedure:            Pre-Anesthesia Assessment:                       - Prior to the procedure, a History and Physical was                        performed, and patient medications, allergies and                        sensitivities were reviewed. The patient's tolerance of                        previous anesthesia was reviewed.                       - The risks and benefits of the procedure and the                        sedation options and risks were discussed with the                        patient. All questions were answered and informed                        consent was obtained.                       - Patient identification and proposed procedure were                        verified prior to the procedure by the physician, the                        nurse, the anesthesiologist, the anesthetist and the                        technician. The procedure was verified in the procedure                        room.                       - ASA Grade Assessment: II - A patient with mild  systemic disease.                       After obtaining informed consent, the endoscope was                        passed under direct vision. Throughout the procedure,                        the patient's blood pressure, pulse, and oxygen   saturations were monitored continuously. The Endoscope                        was introduced through the mouth, and advanced to the                        second part of duodenum. The upper GI endoscopy was                        accomplished with ease. The patient tolerated the                        procedure well. Findings:      LA Grade A (one or more mucosal breaks less than 5 mm, not extending       between tops of 2 mucosal folds) esophagitis with no bleeding was found       at the gastroesophageal junction.      Localized mild mucosal changes characterized by thickened folds were       found at the gastroesophageal junction. Biopsies were taken with a cold       forceps for histology.      There is no endoscopic evidence of stenosis or stricture in the entire       esophagus. Biopsies were obtained from the proximal and distal esophagus       with cold forceps for histology of suspected eosinophilic esophagitis.      Patchy mildly erythematous mucosa without bleeding was found in the       gastric antrum. Biopsies were taken with a cold forceps for histology.       Biopsies were obtained in the gastric body, at the incisura and in the       gastric antrum with cold forceps for histology.      A few, 2 to 4 mm non-bleeding erosions were found in the gastric antrum.       There were no stigmata of recent bleeding. Biopsies were taken with a       cold forceps for histology.      Patchy mildly erythematous mucosa without active bleeding and with no       stigmata of bleeding was found in the duodenal bulb.      The second portion of the duodenum was normal. Impression:           - LA Grade A reflux esophagitis.                       - Thickened folds mucosa in the esophagus. Biopsied.                       - Erythematous mucosa in the antrum. Biopsied.                       -  Non-bleeding erosive gastropathy. Biopsied.                       - Erythematous duodenopathy.                        - Normal second portion of the duodenum.                       - Biopsies were obtained in the gastric body, at the                        incisura and in the gastric antrum. Recommendation:       - Await pathology results.                       - Discharge patient to home (with escort).                       - Advance diet as tolerated.                       - Continue present medications.                       - Patient has a contact number available for                        emergencies. The signs and symptoms of potential                        delayed complications were discussed with the patient.                        Return to normal activities tomorrow. Written discharge                        instructions were provided to the patient.                       - Discharge patient to home (with escort).                       - The findings and recommendations were discussed with                        the patient.                       - The findings and recommendations were discussed with                        the patient's family.                       - Take prescribed proton pump inhibitor or H2 blocker                        (antacid) medications 30 - 60 minutes before meals. (Pt                        was not taking the omeprazole 30 mins before food.  Correct usage discussed in detail) Procedure Code(s):    --- Professional ---                       [phone], Esophagogastroduodenoscopy, flexible, transoral;                        with biopsy, single or multiple Diagnosis Code(s):    --- Professional ---                       K21.0, Gastro-esophageal reflux disease with esophagitis                       K22.8, Other specified diseases of esophagus                       K31.89, Other diseases of stomach and duodenum                       K22.70, Barrett's esophagus without dysplasia                       R10.9, Unspecified abdominal pain                        R13.10, Dysphagia, unspecified CPT copyright 2019 American Medical Association. All rights reserved. The codes documented in this report are preliminary and upon coder review may  be revised to meet current compliance requirements.  Melodie Bouillon, MD Michel Bickers B. Maximino Greenland MD, MD 04/09/2019 10:47:14 AM This report has been signed electronically. Number of Addenda: 0 Note Initiated On: 04/09/2019 10:13 AM Estimated Blood Loss: Estimated blood loss: none.      [facility]

## 2019-04-09 NOTE — Anesthesia Preprocedure Evaluation (Addendum)
Anesthesia Evaluation  Patient identified by MRN, date of birth, ID band Patient awake    Reviewed: Allergy & Precautions, NPO status , Patient's Chart, lab work & pertinent test results, reviewed documented beta blocker date and time   Airway Mallampati: III  TM Distance: >3 FB     Dental  (+) Chipped   Pulmonary Current Smoker,           Cardiovascular hypertension, Pt. on medications + Past MI and + Cardiac Stents  + Valvular Problems/Murmurs      Neuro/Psych PSYCHIATRIC DISORDERS Anxiety Depression  Neuromuscular disease    GI/Hepatic   Endo/Other    Renal/GU      Musculoskeletal  (+) Arthritis ,   Abdominal   Peds  Hematology   Anesthesia Other Findings Aortic valve replaced.   Reproductive/Obstetrics                            Anesthesia Physical Anesthesia Plan  ASA: III  Anesthesia Plan: General   Post-op Pain Management:    Induction: Intravenous  PONV Risk Score and Plan:   Airway Management Planned:   Additional Equipment:   Intra-op Plan:   Post-operative Plan:   Informed Consent: I have reviewed the patients History and Physical, chart, labs and discussed the procedure including the risks, benefits and alternatives for the proposed anesthesia with the patient or authorized representative who has indicated his/her understanding and acceptance.       Plan Discussed with: CRNA  Anesthesia Plan Comments:        Anesthesia Quick Evaluation

## 2019-04-12 ENCOUNTER — Encounter: Payer: Self-pay | Admitting: Gastroenterology

## 2019-04-12 LAB — SURGICAL PATHOLOGY

## 2019-04-13 ENCOUNTER — Encounter: Payer: Self-pay | Admitting: Gastroenterology

## 2019-04-13 ENCOUNTER — Telehealth: Payer: Self-pay

## 2019-04-13 MED ORDER — FAMOTIDINE 20 MG PO TABS: 20.0000 mg | ORAL_TABLET | Freq: Every day | ORAL | 0 refills | Status: DC

## 2019-04-13 NOTE — Telephone Encounter (Signed)
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  These changes were present in her previous EGD and biopsies as well.  There are no signs of infection in her stomach.  Intestinal metaplasia is a finding that is recommended to be monitored with further biopsies of the stomach in the next 6 months.  I would recommend a repeat EGD in 6 months.  Please set clinic and EGD recall for 6 months

## 2019-04-13 NOTE — Telephone Encounter (Signed)
Per Dr. Bonna Gains order to send Pepcid 20mg  30 days 0 refills. Informed patient this was called in for her

## 2019-04-13 NOTE — Telephone Encounter (Signed)
Patient verbalized understanding. Put patient on recall list for EGD and office visit in 6 months. Patient states at her procedure she mention that if she drinks water at bedtime she has to sit up for 45 minutes before laying down or will come right back up. She states you had mention about taking a medication at bedtime but nothing was called in to the pharmacy.

## 2019-04-20 ENCOUNTER — Ambulatory Visit: Payer: Federal, State, Local not specified - PPO | Admitting: Cardiovascular Disease

## 2019-04-20 ENCOUNTER — Telehealth: Payer: Self-pay

## 2019-04-20 NOTE — Telephone Encounter (Addendum)
Tried calling patient to see if she wanted to receive her pneumonia vaccine. Patient also due for a flu shot and follow-up.  No answer, LVM for patient to return phone call.

## 2019-04-22 ENCOUNTER — Encounter: Payer: Self-pay | Admitting: Family Medicine

## 2019-04-22 ENCOUNTER — Ambulatory Visit: Payer: Federal, State, Local not specified - PPO | Admitting: Family Medicine

## 2019-04-22 ENCOUNTER — Other Ambulatory Visit: Payer: Self-pay

## 2019-04-22 VITALS — BP 159/100 | HR 82 | Temp 98.6°F

## 2019-04-22 DIAGNOSIS — M546 Pain in thoracic spine: Secondary | ICD-10-CM

## 2019-04-22 DIAGNOSIS — Z23 Encounter for immunization: Secondary | ICD-10-CM | POA: Diagnosis not present

## 2019-04-22 DIAGNOSIS — I10 Essential (primary) hypertension: Secondary | ICD-10-CM | POA: Diagnosis not present

## 2019-04-22 DIAGNOSIS — R252 Cramp and spasm: Secondary | ICD-10-CM

## 2019-04-22 DIAGNOSIS — F419 Anxiety disorder, unspecified: Secondary | ICD-10-CM | POA: Diagnosis not present

## 2019-04-22 MED ORDER — LISINOPRIL 40 MG PO TABS: 40.0000 mg | ORAL_TABLET | Freq: Two times a day (BID) | ORAL | 1 refills | Status: DC

## 2019-04-22 MED ORDER — ALPRAZOLAM 0.5 MG PO TABS: 0.5000 mg | ORAL_TABLET | Freq: Every day | ORAL | 0 refills | Status: DC | PRN

## 2019-04-22 MED ORDER — VALACYCLOVIR HCL 1 G PO TABS: 1000.0000 mg | ORAL_TABLET | Freq: Two times a day (BID) | ORAL | 0 refills | Status: DC

## 2019-04-22 MED ORDER — CITALOPRAM HYDROBROMIDE 10 MG PO TABS: ORAL_TABLET | ORAL | 3 refills | Status: DC

## 2019-04-22 MED ORDER — DICLOFENAC SODIUM 1 % TD GEL: 4.0000 g | Freq: Four times a day (QID) | TRANSDERMAL | 3 refills | Status: DC

## 2019-04-22 NOTE — Assessment & Plan Note (Signed)
Did not tolerate her sertraline. Will refill her xanax- to last through the end of the year. Will start celexa and recheck 1 month. Call with any concerns.

## 2019-04-22 NOTE — Progress Notes (Signed)
BP (!) 159/100   Pulse 82   Temp 98.6 F (37 C)   SpO2 96%    Subjective:    Patient ID: Tara [name], female    DOB: November 01, 1945, 73 y.o.   MRN: [id number]  HPI: Tara [name] is a 73 y.o. female  Chief Complaint  Patient presents with  . Back Pain    burning and itching sensation on right side of spine  . mood    Patient states that vision is better since stopping the sertraline   . Hypertension   HYPERTENSION-  Hypertension status: uncontrolled  Satisfied with current treatment? no Duration of hypertension: chronic BP monitoring frequency:  not checking BP medication side effects:  no Medication compliance: good compliance Previous BP meds: imdur, lisinopril, hctz, amlodipine Aspirin: yes Recurrent headaches: no Visual changes: yes Palpitations: no Dyspnea: no Chest pain: no Lower extremity edema: no Dizzy/lightheaded: no  Back has been hurting her for about a week and a half, was severely constipated for 6 days, feels like she has been hit with bat. She is very sensitive, no rash, itchy and burning.   DEPRESSION Mood status: exacerbated Satisfied with current treatment?: no Symptom severity: severe  Duration of current treatment : chronic Side effects: yes Medication compliance: fair compliance Psychotherapy/counseling: no  Previous psychiatric medications: sertraline, celexa years ago, xanax Depressed mood: yes Anxious mood: yes Anhedonia: no Significant weight loss or gain: no Insomnia: yes hard to fall asleep Fatigue: yes Feelings of worthlessness or guilt: yes Impaired concentration/indecisiveness: yes Suicidal ideations: no Hopelessness: no Crying spells: yes Depression screen [facility] 2/9 04/22/2019 02/05/2019  Decreased Interest 2 2  Down, Depressed, Hopeless 2 2  PHQ - 2 Score 4 4  Altered sleeping 3 3  Tired, decreased energy 3 2  Change in appetite 2 3  Feeling bad or failure about yourself  2 2  Trouble concentrating 2 1   Moving slowly or fidgety/restless 0 2  Suicidal thoughts 0 0  PHQ-9 Score 16 17  Difficult doing work/chores Extremely dIfficult Very difficult   GAD 7 : Generalized Anxiety Score 04/22/2019 02/05/2019  Nervous, Anxious, on Edge 3 3  Control/stop worrying 3 3  Worry too much - different things 3 3  Trouble relaxing 3 3  Restless 2 2  Easily annoyed or irritable 3 3  Afraid - awful might happen 3 1  Total GAD 7 Score 20 18  Anxiety Difficulty Very difficult Very difficult    Relevant past medical, surgical, family and social history reviewed and updated as indicated. Interim medical history since our last visit reviewed. Allergies and medications reviewed and updated.  Review of Systems  Constitutional: Positive for fatigue. Negative for activity change, appetite change, chills, diaphoresis, fever and unexpected weight change.  Respiratory: Negative.   Cardiovascular: Negative.   Gastrointestinal: Negative.   Musculoskeletal: Positive for back pain and myalgias. Negative for arthralgias, joint swelling, neck pain and neck stiffness.  Skin: Negative.   Neurological: Negative.   Psychiatric/Behavioral: Positive for dysphoric mood and sleep disturbance. Negative for agitation, behavioral problems, confusion, decreased concentration, hallucinations, self-injury and suicidal ideas. The patient is nervous/anxious. The patient is not hyperactive.     Per HPI unless specifically indicated above     Objective:    BP (!) 159/100   Pulse 82   Temp 98.6 F (37 C)   SpO2 96%   Wt Readings from Last 3 Encounters:  04/09/19 180 lb (81.6 kg)  03/19/19 181 lb (82.1 kg)  02/22/19 182 lb 3.2 oz (82.6 kg)    Physical Exam Vitals signs and nursing note reviewed.  Constitutional:      General: She is not in acute distress.    Appearance: Normal appearance. She is not ill-appearing, toxic-appearing or diaphoretic.  HENT:     Head: Normocephalic and atraumatic.     Right Ear: External ear  normal.     Left Ear: External ear normal.     Nose: Nose normal.     Mouth/Throat:     Mouth: Mucous membranes are moist.     Pharynx: Oropharynx is clear.  Eyes:     General: No scleral icterus.       Right eye: No discharge.        Left eye: No discharge.     Extraocular Movements: Extraocular movements intact.     Conjunctiva/sclera: Conjunctivae normal.     Pupils: Pupils are equal, round, and reactive to light.  Neck:     Musculoskeletal: Normal range of motion and neck supple.  Cardiovascular:     Rate and Rhythm: Normal rate and regular rhythm.     Pulses: Normal pulses.     Heart sounds: Normal heart sounds. No murmur. No friction rub. No gallop.   Pulmonary:     Effort: Pulmonary effort is normal. No respiratory distress.     Breath sounds: Normal breath sounds. No stridor. No wheezing, rhonchi or rales.  Chest:     Chest wall: No tenderness.  Musculoskeletal: Normal range of motion.        General: Tenderness (tenderness on R upper back about T8) present.  Skin:    General: Skin is warm and dry.     Capillary Refill: Capillary refill takes less than 2 seconds.     Coloration: Skin is not jaundiced or pale.     Findings: No bruising, erythema, lesion or rash.  Neurological:     General: No focal deficit present.     Mental Status: She is alert and oriented to person, place, and time. Mental status is at baseline.  Psychiatric:        Mood and Affect: Mood normal.        Behavior: Behavior normal.        Thought Content: Thought content normal.        Judgment: Judgment normal.     Results for orders placed or performed during the hospital encounter of 04/09/19  Surgical pathology  Result Value Ref Range   SURGICAL PATHOLOGY      SURGICAL PATHOLOGY CASE: [id number] PATIENT: [facility] Surgical Pathology Report     Specimen Submitted: A. Stomach erythema, erosions, r/o h pylori; cbx B. GEJ, thickening; cbx C. Esophagus, r/o EOE; cbx D. Colon,  random, r/o microscopic colitis; cbx  Clinical History: Colonoscopy DX Z12.11 and EGD K22.70.  Gastric erythema and erosions    DIAGNOSIS: A. STOMACH; COLD BIOPSY: - GASTRIC ANTRAL MUCOSA WITH INTESTINAL METAPLASIA AND FEATURES OF REACTIVE GASTROPATHY. - GASTRIC OXYNTIC MUCOSA WITH NO SIGNIFICANT HISTOPATHOLOGIC CHANGE. - NEGATIVE FOR H. PYLORI, DYSPLASIA, AND MALIGNANCY.  B. GASTROESOPHAGEAL JUNCTION; COLD BIOPSY: - SQUAMOCOLUMNAR MUCOSA WITH FEATURES OF MILD REFLUX GASTROESOPHAGITIS. - NO INCREASE IN INTRAEPITHELIAL EOSINOPHILS (LESS THAN 2 PER HPF). - NEGATIVE FOR INTESTINAL METAPLASIA, DYSPLASIA, AND MALIGNANCY.  C. ESOPHAGUS; COLD BIOPSY: - BENIGN SQUAMOUS MUCOSA WITH FEATURES OF REFLUX ESOPHAGITIS. - NO INCREASE IN INTRAEPITHELIA L EOSINOPHILS (<2 PER HPF). - NEGATIVE FOR DYSPLASIA AND MALIGNANCY.  D. COLON, RANDOM; COLD BIOPSY: - BENIGN COLONIC  MUCOSA WITH NO SIGNIFICANT HISTOPATHOLOGIC CHANGE. - NEGATIVE FOR FEATURES OF MICROSCOPIC COLITIS. - NEGATIVE FOR DYSPLASIA AND MALIGNANCY.  GROSS DESCRIPTION: A. Labeled: C BX gastric erythema and erosion, rule out H. pylori Received: In formalin Tissue fragment(s): 5 Size: From 0.1-0.2 cm Description: Tan soft tissue fragments Entirely submitted in 1 cassette.  B. Labeled: C BX thickening at GE J Received: In formalin Tissue fragment(s): 1 Size: 0.2 cm Description: Tan soft tissue fragment Entirely submitted in 1 cassette.  C. Labeled: C BX esophagus, rule out EOE Received: In formalin Tissue fragment(s): Multiple Size: Aggregate, 0.8 x 0.6 x 0.1 cm Description: Tan soft tissue fragments Entirely submitted in 1 cassette.  D. Labeled: Random colon C BX, rule out microscopic colitis Received: In formalin Tissue fragment(s): Multiple Size : Aggregate, 0.7 x 0.6 x 0.1 cm Description: Tan soft tissue fragments Entirely submitted in 1 cassette.     Final Diagnosis performed by Katherine MantleHeath Jones, MD.   Electronically  signed 04/12/2019 2:47:22PM The electronic signature indicates that the named Attending Pathologist has evaluated the specimen Technical component performed at [facility], [address], [place], [place] [address] Lab: [phone] Dir: Jolene SchimkeSanjai Nagendra, MD, MMM  Professional component performed at [facility], [facility], [address], [place], [place] [address] Lab: [phone] Dir: Georgiann Cockerara C. Oneita Krasubinas, MD       Assessment & Plan:   Problem List Items Addressed This Visit      Cardiovascular and Mediastinum   HTN (hypertension) - Primary    Not under good control. Did not do any better with insomnia off the lisinopril. Would like to go back on the lisinopril. Stop losartan. Restart 80mg  lisinopril daily. Recheck 1 month.       Relevant Medications   lisinopril (ZESTRIL) 40 MG tablet   Other Relevant Orders   Basic metabolic panel     Other   Anxiety    Did not tolerate her sertraline. Will refill her xanax- to last through the end of the year. Will start celexa and recheck 1 month. Call with any concerns.       Relevant Medications   citalopram (CELEXA) 10 MG tablet   ALPRAZolam (XANAX) 0.5 MG tablet    Other Visit Diagnoses    Acute left-sided thoracic back pain       No rash, but concern for shingles. Will treat with valtrex and voltaren. Call with any concerns or if not getting better.    Immunization due       Flu shot and prevnar given today.   Relevant Orders   Flu Vaccine QUAD High Dose(Fluad) (Completed)   Leg cramps       Will check labs. Await results. Call wiht any concerns.        Follow up plan: Return in about 4 weeks (around 05/20/2019).

## 2019-04-22 NOTE — Assessment & Plan Note (Signed)
Not under good control. Did not do any better with insomnia off the lisinopril. Would like to go back on the lisinopril. Stop losartan. Restart 80mg  lisinopril daily. Recheck 1 month.

## 2019-04-29 ENCOUNTER — Encounter: Payer: Self-pay | Admitting: Cardiovascular Disease

## 2019-04-29 ENCOUNTER — Other Ambulatory Visit: Payer: Self-pay

## 2019-04-29 ENCOUNTER — Ambulatory Visit: Payer: Federal, State, Local not specified - PPO | Admitting: Cardiovascular Disease

## 2019-04-29 VITALS — BP 139/71 | HR 84 | Temp 97.4°F | Ht 63.0 in | Wt 177.5 lb

## 2019-04-29 DIAGNOSIS — Z720 Tobacco use: Secondary | ICD-10-CM

## 2019-04-29 DIAGNOSIS — I739 Peripheral vascular disease, unspecified: Secondary | ICD-10-CM | POA: Diagnosis not present

## 2019-04-29 DIAGNOSIS — E782 Mixed hyperlipidemia: Secondary | ICD-10-CM

## 2019-04-29 DIAGNOSIS — I10 Essential (primary) hypertension: Secondary | ICD-10-CM

## 2019-04-29 DIAGNOSIS — I359 Nonrheumatic aortic valve disorder, unspecified: Secondary | ICD-10-CM

## 2019-04-29 NOTE — Progress Notes (Signed)
Cardiology Office Note   Date:  04/29/2019   ID:  Tara [name], DOB 26-Jun-1946, MRN [id number]  PCP:  Valerie Roys, DO  Cardiologist:   Kathlyn Sacramento, MD   Chief Complaint  Patient presents with   other    Ref by Park Liter, DO to establish care for aortic valve replacement. Meds reviewed by the pt. verbally.       History of Present Illness: Tara [name] is a 73 y.o. female who presents to establish cardiovascular care.  She reports having history of coronary artery disease with previous stent placement in 2008 in [place].  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  Other medical problems include tobacco use, essential hypertension and hyperlipidemia.  She currently smokes 3 cigarettes a day and has not been able to quit since her son committed suicide 3 years ago. She had cardiac work-up at [facility] in [place] in March.  This included an echocardiogram which showed normal LV systolic function with normal functioning bioprosthetic aortic valve with a mean gradient of 15 mmHg.  Lexiscan Myoview showed no evidence of ischemia with an EF of 74%. She had lower extremity arterial Doppler also around the same time which showed significant left SFA disease. The patient has been doing reasonably well overall with no chest pain or shortness of breath.  She is mostly bothered by right calf discomfort after she walks a short distance.  This also wakes her up from sleep with significant pain.  Past Medical History:  Diagnosis Date   Anxiety    Barrett's syndrome    Depression    Heart murmur    Hyperlipidemia    Hypertension    Myocardial infarction [facility])    08    Past Surgical History:  Procedure Laterality Date   ABDOMINAL HYSTERECTOMY     CARDIAC CATHETERIZATION     CARDIAC VALVE REPLACEMENT     COLON SURGERY     COLONOSCOPY WITH PROPOFOL N/A 04/09/2019   Procedure: COLONOSCOPY WITH  PROPOFOL;  Surgeon: Virgel Manifold, MD;  Location: [facility] ENDOSCOPY;  Service: Endoscopy;  Laterality: N/A;   CORONARY ANGIOPLASTY WITH STENT PLACEMENT  2008   [place]; [facility] Stent placement x 1    ESOPHAGOGASTRODUODENOSCOPY (EGD) WITH PROPOFOL N/A 04/09/2019   Procedure: ESOPHAGOGASTRODUODENOSCOPY (EGD) WITH PROPOFOL;  Surgeon: Virgel Manifold, MD;  Location: [facility] ENDOSCOPY;  Service: Endoscopy;  Laterality: N/A;   EXCISIONAL HEMORRHOIDECTOMY     EYE SURGERY     HERNIA REPAIR       Current Outpatient Medications  Medication Sig Dispense Refill   ALPRAZolam (XANAX) 0.5 MG tablet Take 1 tablet (0.5 mg total) by mouth daily as needed for anxiety. 30 tablet 0   aspirin EC 325 MG tablet Take 1 tablet by mouth daily.     atorvastatin (LIPITOR) 40 MG tablet Take 40 mg by mouth daily.     citalopram (CELEXA) 10 MG tablet Take 1/2 tab for 1 week, then increase to 1 tab daily 30 tablet 3   cyanocobalamin (,VITAMIN B-12,) 1000 MCG/ML injection Inject 1 mL (1,000 mcg total) into the muscle every 30 (thirty) days. 1 mL 12   diclofenac sodium (VOLTAREN) 1 % GEL Apply 4 g topically 4 (four) times daily. 100 g 3   famotidine (PEPCID) 20 MG tablet Take 1 tablet (20 mg total) by mouth at bedtime. 30 tablet 0   fenofibrate micronized (LOFIBRA) 200  MG capsule Take 1 capsule (200 mg total) by mouth daily. 90 capsule 1   hydrochlorothiazide (HYDRODIURIL) 25 MG tablet TAKE 1 TABLET(25 MG) BY MOUTH DAILY 90 tablet 0   HYDROcodone-acetaminophen (NORCO/VICODIN) 5-325 MG tablet TK 1 T PO Q 6 TO 8 H     Insulin Syringe 27G X 1/2" 1 ML MISC 1 each by Does not apply route every 30 (thirty) days. 10 each 3   isosorbide mononitrate (IMDUR) 30 MG 24 hr tablet Take 1 tablet (30 mg total) by mouth 2 (two) times a day. 60 tablet 3   lidocaine-prilocaine (EMLA) cream Apply 1 application topically as needed. 30 g 6   lisinopril (ZESTRIL) 40 MG tablet Take 1 tablet (40 mg total)  by mouth 2 (two) times daily. 180 tablet 1   loperamide (IMODIUM) 2 MG capsule Take 2 mg by mouth as needed for diarrhea or loose stools.     nitroGLYCERIN (NITROSTAT) 0.4 MG SL tablet Place 0.4 mg under the tongue every 5 (five) minutes as needed.      omeprazole (PRILOSEC) 40 MG capsule TK ONE C PO BID 180 capsule 1   ondansetron (ZOFRAN) 4 MG tablet TK 1 T PO  Q 6 H PRN 60 tablet 2   oxyCODONE-acetaminophen (PERCOCET/ROXICET) 5-325 MG tablet TK 1 T PO Q 6 H PRF PAIN. MAY FILL 02/10/19     valACYclovir (VALTREX) 1000 MG tablet Take 1 tablet (1,000 mg total) by mouth 2 (two) times daily. 20 tablet 0   No current facility-administered medications for this visit.     Allergies:   Patient has no known allergies.    Social History:  The patient  reports that she has been smoking cigarettes. She has a 0.75 pack-year smoking history. She has never used smokeless tobacco. She reports that she does not drink alcohol or use drugs.   Family History:  The patient's She was adopted. Family history is unknown by patient.    ROS:  Please see the history of present illness.   Otherwise, review of systems are positive for none.   All other systems are reviewed and negative.    PHYSICAL EXAM: VS:  BP 139/71 (BP Location: Right Arm, Patient Position: Sitting, Cuff Size: Normal)    Pulse 84    Temp (!) 97.4 F (36.3 C)    Ht 5\' 3"  (1.6 m)    Wt 177 lb 8 oz (80.5 kg)    BMI 31.44 kg/m  , BMI Body mass index is 31.44 kg/m. GEN: Well nourished, well developed, in no acute distress  HEENT: normal  Neck: no JVD, carotid bruits, or masses Cardiac: RRR; no  rubs, or gallops, there is a 2 out of 6 systolic murmur in the aortic area.  There is mild bilateral leg edema. Respiratory:  clear to auscultation bilaterally, normal work of breathing GI: soft, nontender, nondistended, + BS MS: no deformity or atrophy  Skin: warm and dry, no rash Neuro:  Strength and sensation are intact Psych: euthymic mood,  full affect Vascular: Femoral pulses slightly diminished bilaterally.  Dorsalis pedis is +1 bilaterally.  Posterior tibial is not palpable.   EKG:  EKG is ordered today. The ekg ordered today demonstrates normal sinus rhythm with LVH with repolarization abnormalities and left atrial enlargement   Recent Labs: 02/05/2019: ALT 19; Hemoglobin 16.4; Platelets 170; TSH 2.380 03/19/2019: BUN 27; Creatinine, Ser 0.87; Potassium 4.3; Sodium 141    Lipid Panel    Component Value Date/Time   CHOL 217 (  H) 02/05/2019 1619   TRIG 199 (H) 02/05/2019 1619   HDL 40 02/05/2019 1619   LDLCALC 137 (H) 02/05/2019 1619      Wt Readings from Last 3 Encounters:  04/29/19 177 lb 8 oz (80.5 kg)  04/09/19 180 lb (81.6 kg)  03/19/19 181 lb (82.1 kg)       PAD Screen 04/29/2019  Previous PAD dx? No  Previous surgical procedure? No  Pain with walking? Yes  Subsides with rest? Yes  Feet/toe relief with dangling? No  Painful, non-healing ulcers? No  Extremities discolored? No      ASSESSMENT AND PLAN:  1.  Right calf claudication: Abnormal vascular exam.  I requested lower extremity arterial Doppler.  2.  Status post bioprosthetic aortic valve placement: He had an echocardiogram done in March which showed normal functioning valve.  3.  Coronary artery disease involving native coronary arteries without angina: Stress test this year showed no evidence of ischemia.  4.  Hyperlipidemia: Continue treatment with atorvastatin with a target LDL of less than 70.  Lipid profile in July showed an LDL of 137 but not entirely clear if she was taking atorvastatin.  5.  Essential hypertension: Blood pressure is reasonably controlled on current medication.  6.  Tobacco use: She reports inability to quit due to stress.    Disposition:   FU with me in 6 months  Signed,  Lorine Bears, MD  04/29/2019 2:25 PM    [facility]

## 2019-04-29 NOTE — Patient Instructions (Signed)
Medication Instructions:  Your physician recommends that you continue on your current medications as directed. Please refer to the Current Medication list given to you today.  If you need a refill on your cardiac medications before your next appointment, please call your pharmacy.   Lab work: None ordered  If you have labs (blood work) drawn today and your tests are completely normal, you will receive your results only by: Marland Kitchen MyChart Message (if you have MyChart) OR . A paper copy in the mail If you have any lab test that is abnormal or we need to change your treatment, we will call you to review the results.  Testing/Procedures: Your physician has requested that you have a lower extremity arterial exercise duplex. During this test, exercise and ultrasound are used to evaluate arterial blood flow in the legs. Allow one hour for this exam. There are no restrictions or special instructions.   Follow-Up: At [facility], you and your health needs are our priority.  As part of our continuing mission to provide you with exceptional heart care, we have created designated Provider Care Teams.  These Care Teams include your primary Cardiologist (physician) and Advanced Practice Providers (APPs -  Physician Assistants and Nurse Practitioners) who all work together to provide you with the care you need, when you need it. You will need a follow up appointment in 6 months.  Please call our office 2 months in advance to schedule this appointment.  You may see  Dr. Fletcher Anon or one of the following Advanced Practice Providers on your designated Care Team:   Murray Hodgkins, NP Christell Faith, PA-C . Marrianne Mood, PA-C  Any Other Special Instructions Will Be Listed Below (If Applicable). N/A

## 2019-05-06 ENCOUNTER — Telehealth: Payer: Self-pay | Admitting: Family Medicine

## 2019-05-06 NOTE — Telephone Encounter (Signed)
Pt saw dr Wynetta Emery  On 04-22-2019. Pt is still having burning pain and pain has spread to her right breast and under arm pit. There is no redness. Pt has finished valtrex. Please advice . Pt would like to know if zpak  or refill on valtrex may help [employer] [facility] in [place]

## 2019-05-06 NOTE — Telephone Encounter (Signed)
Called pt to schedule, left vm

## 2019-05-06 NOTE — Telephone Encounter (Signed)
Can we get her into see Malachy Mood today?

## 2019-05-07 NOTE — Telephone Encounter (Signed)
Appt scheduled 05/10/2019.

## 2019-05-10 ENCOUNTER — Other Ambulatory Visit: Payer: Self-pay

## 2019-05-10 ENCOUNTER — Ambulatory Visit (INDEPENDENT_AMBULATORY_CARE_PROVIDER_SITE_OTHER): Payer: Federal, State, Local not specified - PPO | Admitting: Family Medicine

## 2019-05-10 ENCOUNTER — Encounter: Payer: Self-pay | Admitting: Family Medicine

## 2019-05-10 VITALS — BP 178/99 | HR 73 | Temp 98.5°F | Ht 63.0 in | Wt 174.0 lb

## 2019-05-10 DIAGNOSIS — N644 Mastodynia: Secondary | ICD-10-CM

## 2019-05-10 DIAGNOSIS — F419 Anxiety disorder, unspecified: Secondary | ICD-10-CM | POA: Diagnosis not present

## 2019-05-10 DIAGNOSIS — I10 Essential (primary) hypertension: Secondary | ICD-10-CM

## 2019-05-10 MED ORDER — NORTRIPTYLINE HCL 25 MG PO CAPS: 25.0000 mg | ORAL_CAPSULE | Freq: Every day | ORAL | 3 refills | Status: DC

## 2019-05-10 MED ORDER — CITALOPRAM HYDROBROMIDE 10 MG PO TABS: 10.0000 mg | ORAL_TABLET | Freq: Two times a day (BID) | ORAL | 3 refills | Status: DC

## 2019-05-10 NOTE — Progress Notes (Signed)
BP (!) 178/99   Pulse 73   Temp 98.5 F (36.9 C) (Oral)   Ht 5\' 3"  (1.6 m)   Wt 174 lb (78.9 kg)   SpO2 97%   BMI 30.82 kg/m    Subjective:    Patient ID: Tara [name], female    DOB: 11-Sep-1945, 73 y.o.   MRN: [id number]  HPI: Tara [name] is a 73 y.o. female  Chief Complaint  Patient presents with  . Breast Pain    right side due to the rash she has had on her back tha tis spreading out towards her under arm and breast. x 2 weeks   BREAST PAIN Duration: 2 weeks Location: right whole breast Onset: sudden Severity: severe Quality: aching and burning Frequency: constant Redness: no Swelling: no Trauma: no trauma Breastfeeding: no Associated with menstral cycle: no Nipple discharge: no Breast lump: no Status: worse Treatments attempted: voltaren Previous mammogram: yes- 3 years ago in [place]- feels like it is helping, but wearing off as the day goes on  Mood status: uncontrolled Satisfied with current treatment?: yes Symptom severity: moderate  Duration of current treatment : chronic Side effects: no Medication compliance: good compliance Psychotherapy/counseling: no  Previous psychiatric medications: celexa Depressed mood: yes Anxious mood: yes Anhedonia: no Significant weight loss or gain: no Insomnia: no  Fatigue: yes Feelings of worthlessness or guilt: yes Impaired concentration/indecisiveness: no Suicidal ideations: no Hopelessness: no Crying spells: yes Depression screen [facility] 2/9 04/22/2019 02/05/2019  Decreased Interest 2 2  Down, Depressed, Hopeless 2 2  PHQ - 2 Score 4 4  Altered sleeping 3 3  Tired, decreased energy 3 2  Change in appetite 2 3  Feeling bad or failure about yourself  2 2  Trouble concentrating 2 1  Moving slowly or fidgety/restless 0 2  Suicidal thoughts 0 0  PHQ-9 Score 16 17  Difficult doing work/chores Extremely dIfficult Very difficult   Relevant past medical, surgical, family and  social history reviewed and updated as indicated. Interim medical history since our last visit reviewed. Allergies and medications reviewed and updated.  Review of Systems  Constitutional: Negative.   Respiratory: Negative.   Cardiovascular: Negative.   Musculoskeletal: Negative.   Skin: Negative.   Neurological: Negative.   Psychiatric/Behavioral: Positive for dysphoric mood. Negative for agitation, behavioral problems, confusion, decreased concentration, hallucinations, self-injury, sleep disturbance and suicidal ideas. The patient is nervous/anxious. The patient is not hyperactive.     Per HPI unless specifically indicated above     Objective:    BP (!) 178/99   Pulse 73   Temp 98.5 F (36.9 C) (Oral)   Ht 5\' 3"  (1.6 m)   Wt 174 lb (78.9 kg)   SpO2 97%   BMI 30.82 kg/m   Wt Readings from Last 3 Encounters:  05/10/19 174 lb (78.9 kg)  04/29/19 177 lb 8 oz (80.5 kg)  04/09/19 180 lb (81.6 kg)    Physical Exam Vitals signs and nursing note reviewed.  Constitutional:      General: She is not in acute distress.    Appearance: Normal appearance. She is not ill-appearing, toxic-appearing or diaphoretic.  HENT:     Head: Normocephalic and atraumatic.     Right Ear: External ear normal.     Left Ear: External ear normal.     Nose: Nose normal.     Mouth/Throat:     Mouth: Mucous membranes are moist.     Pharynx: Oropharynx is clear.  Eyes:     General: No scleral icterus.       Right eye: No discharge.        Left eye: No discharge.     Extraocular Movements: Extraocular movements intact.     Conjunctiva/sclera: Conjunctivae normal.     Pupils: Pupils are equal, round, and reactive to light.  Neck:     Musculoskeletal: Normal range of motion and neck supple.  Cardiovascular:     Rate and Rhythm: Normal rate and regular rhythm.     Pulses: Normal pulses.     Heart sounds: Normal heart sounds. No murmur. No friction rub. No gallop.   Pulmonary:     Effort: Pulmonary  effort is normal. No respiratory distress.     Breath sounds: Normal breath sounds. No stridor. No wheezing, rhonchi or rales.  Chest:     Chest wall: No tenderness.  Musculoskeletal: Normal range of motion.  Skin:    General: Skin is warm and dry.     Capillary Refill: Capillary refill takes less than 2 seconds.     Coloration: Skin is not jaundiced or pale.     Findings: No bruising, erythema, lesion or rash.  Neurological:     General: No focal deficit present.     Mental Status: She is alert and oriented to person, place, and time. Mental status is at baseline.  Psychiatric:        Mood and Affect: Mood normal.        Behavior: Behavior normal.        Thought Content: Thought content normal.        Judgment: Judgment normal.     Results for orders placed or performed during the hospital encounter of 04/09/19  Surgical pathology  Result Value Ref Range   SURGICAL PATHOLOGY      SURGICAL PATHOLOGY CASE: [id number] PATIENT: Tara [name] Surgical Pathology Report     Specimen Submitted: A. Stomach erythema, erosions, r/o h pylori; cbx B. GEJ, thickening; cbx C. Esophagus, r/o EOE; cbx D. Colon, random, r/o microscopic colitis; cbx  Clinical History: Colonoscopy DX Z12.11 and EGD K22.70.  Gastric erythema and erosions    DIAGNOSIS: A. STOMACH; COLD BIOPSY: - GASTRIC ANTRAL MUCOSA WITH INTESTINAL METAPLASIA AND FEATURES OF REACTIVE GASTROPATHY. - GASTRIC OXYNTIC MUCOSA WITH NO SIGNIFICANT HISTOPATHOLOGIC CHANGE. - NEGATIVE FOR H. PYLORI, DYSPLASIA, AND MALIGNANCY.  B. GASTROESOPHAGEAL JUNCTION; COLD BIOPSY: - SQUAMOCOLUMNAR MUCOSA WITH FEATURES OF MILD REFLUX GASTROESOPHAGITIS. - NO INCREASE IN INTRAEPITHELIAL EOSINOPHILS (LESS THAN 2 PER HPF). - NEGATIVE FOR INTESTINAL METAPLASIA, DYSPLASIA, AND MALIGNANCY.  C. ESOPHAGUS; COLD BIOPSY: - BENIGN SQUAMOUS MUCOSA WITH FEATURES OF REFLUX ESOPHAGITIS. - NO INCREASE IN INTRAEPITHELIA L EOSINOPHILS (<2 PER HPF).  - NEGATIVE FOR DYSPLASIA AND MALIGNANCY.  D. COLON, RANDOM; COLD BIOPSY: - BENIGN COLONIC MUCOSA WITH NO SIGNIFICANT HISTOPATHOLOGIC CHANGE. - NEGATIVE FOR FEATURES OF MICROSCOPIC COLITIS. - NEGATIVE FOR DYSPLASIA AND MALIGNANCY.  GROSS DESCRIPTION: A. Labeled: C BX gastric erythema and erosion, rule out H. pylori Received: In formalin Tissue fragment(s): 5 Size: From 0.1-0.2 cm Description: Tan soft tissue fragments Entirely submitted in 1 cassette.  B. Labeled: C BX thickening at GE J Received: In formalin Tissue fragment(s): 1 Size: 0.2 cm Description: Tan soft tissue fragment Entirely submitted in 1 cassette.  C. Labeled: C BX esophagus, rule out EOE Received: In formalin Tissue fragment(s): Multiple Size: Aggregate, 0.8 x 0.6 x 0.1 cm Description: Tan soft tissue fragments Entirely submitted in 1 cassette.  D. Labeled: Random colon  C BX, rule out microscopic colitis Received: In formalin Tissue fragment(s): Multiple Size : Aggregate, 0.7 x 0.6 x 0.1 cm Description: Tan soft tissue fragments Entirely submitted in 1 cassette.     Final Diagnosis performed by Katherine Mantle, MD.   Electronically signed 04/12/2019 2:47:22PM The electronic signature indicates that the named Attending Pathologist has evaluated the specimen Technical component performed at [facility], [address] [place], [place] [address] Lab: [phone] Dir: Jolene Schimke, MD, MMM  Professional component performed at [facility], [facility], [address] [place], [place], [place] [address] Lab: [phone] Dir: Georgiann Cocker. Oneita Kras, MD       Assessment & Plan:   Problem List Items Addressed This Visit      Cardiovascular and Mediastinum   HTN (hypertension)    Not under good control. Will try to get her pain and anxiety under good control and will recheck in a couple of weeks.         Other   Anxiety    Will increase her celexa to BID and see how it goes.       Relevant  Medications   citalopram (CELEXA) 10 MG tablet   nortriptyline (PAMELOR) 25 MG capsule    Other Visit Diagnoses    Breast pain, right    -  Primary   Will arrange mammogram and will get her x-ray of her back. Start amitriptyline. Continue to monitor.    Relevant Orders   DG Thoracic Spine W/Swimmers   MM Digital Diagnostic Bilat   US BREAST LTD UNI RIGHT INC AXILLA       Follow up plan: Return in about 4 weeks (around 06/07/2019).

## 2019-05-13 ENCOUNTER — Telehealth: Payer: Self-pay | Admitting: Family Medicine

## 2019-05-13 DIAGNOSIS — N644 Mastodynia: Secondary | ICD-10-CM

## 2019-05-13 NOTE — Telephone Encounter (Signed)
Whole breast pain

## 2019-05-13 NOTE — Telephone Encounter (Signed)
Order for screening mammogram already in computer- has been there since July. Please find out exactly what is needed as orders are already in the computer and I'm very confused as to what they are asking for.

## 2019-05-13 NOTE — Telephone Encounter (Signed)
For all over breast pain [place] states mammogram should be changed to screening.Only diagnostic if you can give clock position

## 2019-05-13 NOTE — Telephone Encounter (Signed)
Called over to [place]. Patient is scheduled for mammogram so they have everything that they need.

## 2019-05-13 NOTE — Telephone Encounter (Signed)
Copied from [place] [id number]. Topic: General - Other >> May 13, 2019  9:00 AM Parke Poisson wrote: Reason for CRM: [place] [facility] will need to know clock position of breast pain.If all over order would need to be changed to screening If not will need order changed to diagnostic TOMO

## 2019-05-15 ENCOUNTER — Encounter: Payer: Self-pay | Admitting: Family Medicine

## 2019-05-15 NOTE — Assessment & Plan Note (Signed)
Will increase her celexa to BID and see how it goes.

## 2019-05-15 NOTE — Assessment & Plan Note (Signed)
Not under good control. Will try to get her pain and anxiety under good control and will recheck in a couple of weeks.

## 2019-05-21 ENCOUNTER — Other Ambulatory Visit: Payer: Federal, State, Local not specified - PPO

## 2019-05-27 ENCOUNTER — Other Ambulatory Visit: Payer: Self-pay

## 2019-05-27 ENCOUNTER — Ambulatory Visit
Admission: RE | Admit: 2019-05-27 | Discharge: 2019-05-27 | Disposition: A | Discharge status: Home / Self Care | Payer: Federal, State, Local not specified - PPO | Source: Ambulatory Visit | Attending: Family Medicine | Admitting: Family Medicine

## 2019-05-27 ENCOUNTER — Ambulatory Visit: Payer: Federal, State, Local not specified - PPO | Admitting: Family Medicine

## 2019-05-27 ENCOUNTER — Encounter: Payer: Self-pay | Admitting: Family Medicine

## 2019-05-27 DIAGNOSIS — N644 Mastodynia: Secondary | ICD-10-CM | POA: Insufficient documentation

## 2019-05-27 IMAGING — MG DIGITAL DIAGNOSTIC BILAT W/ TOMO W/ CAD
8 of 16 series · 8 of 40 positions shown · non-contrast
Comparison: None.

CLINICAL DATA: 73-year-old female presenting for evaluation of
nonfocal right breast pain. She describes that she was previously
having pain centrally in her back, which radiated around into her
breast.

EXAM:
DIGITAL DIAGNOSTIC BILATERAL MAMMOGRAM WITH CAD AND TOMO

[R MLO synth-2D (1 of 3)]
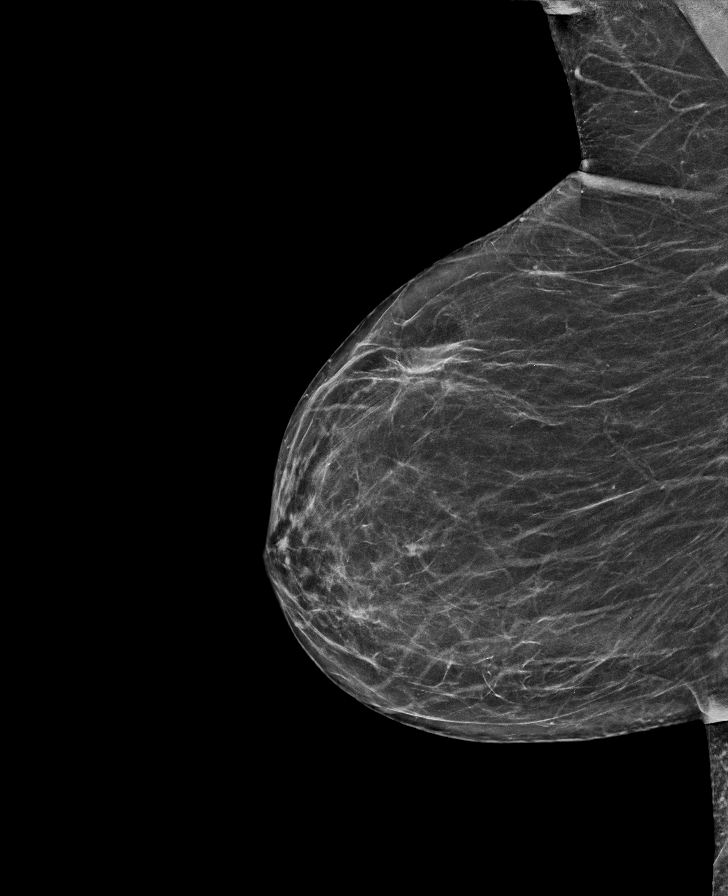

[R MLO synth-2D (2 of 3)]
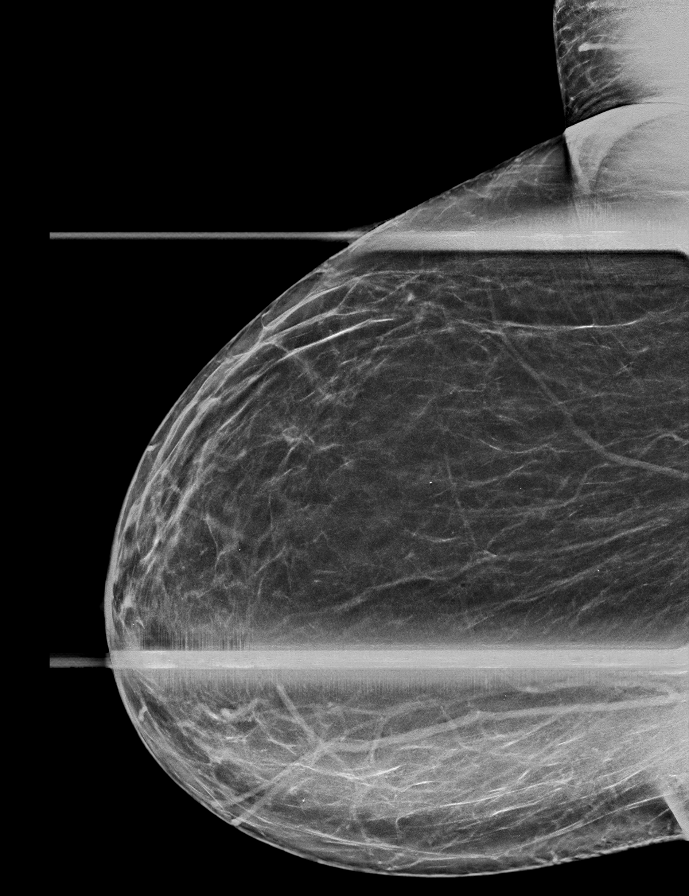

[R ML synth-2D]
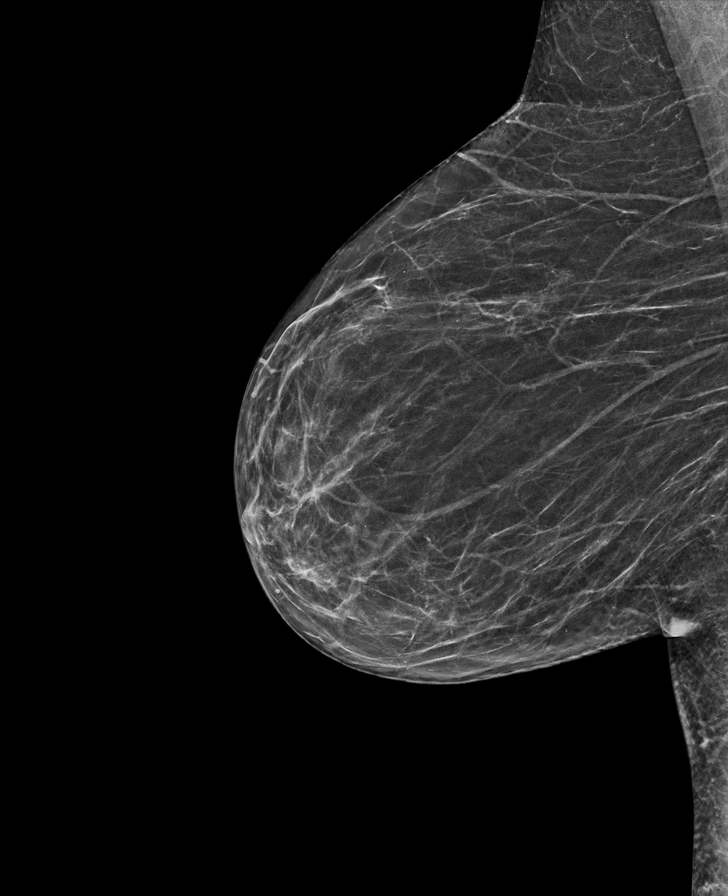

[R CC synth-2D]
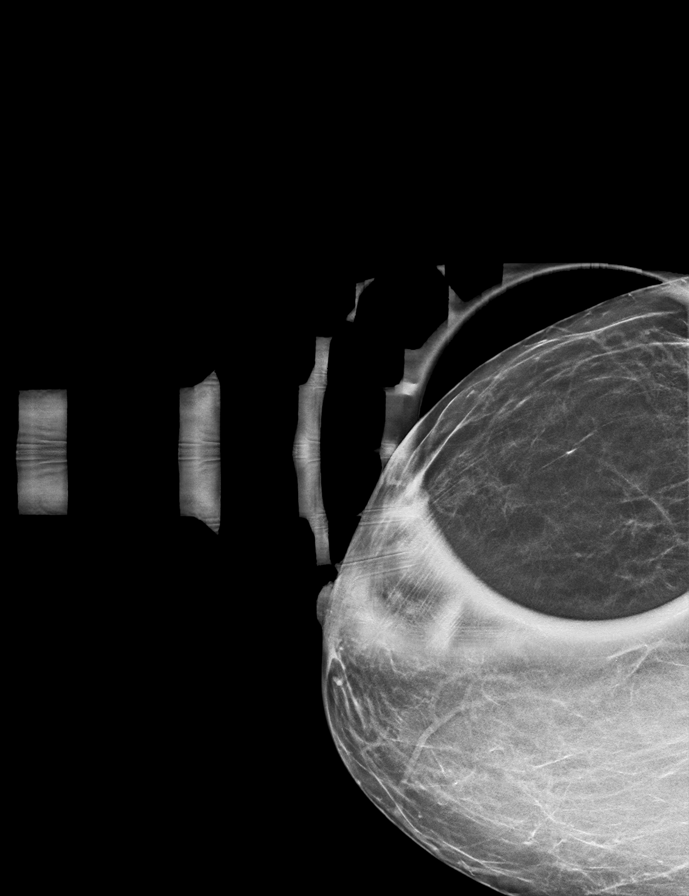

[L CC synth-2D]
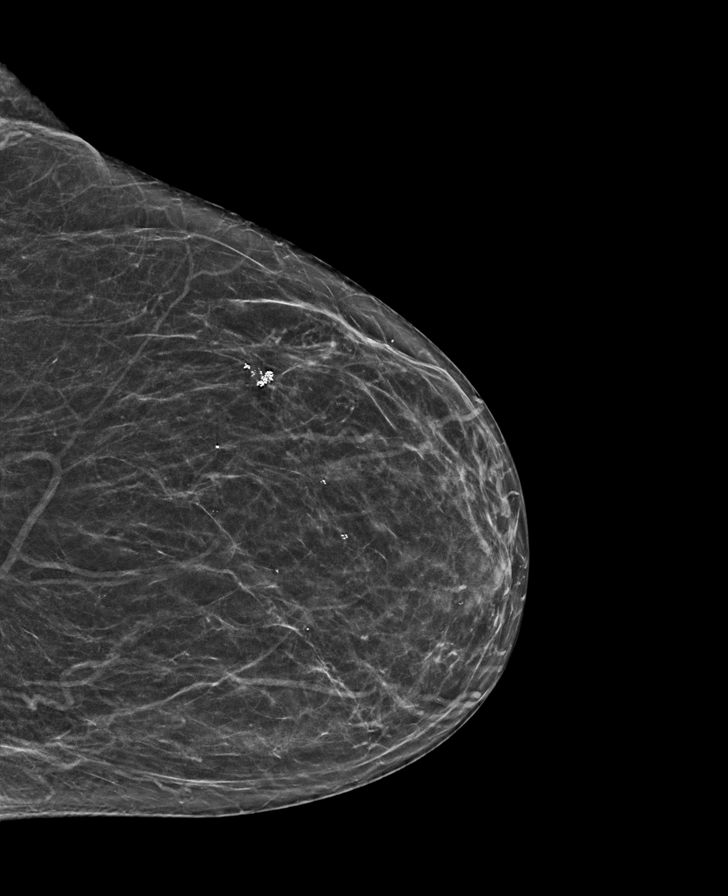

[R MLO synth-2D (3 of 3)]
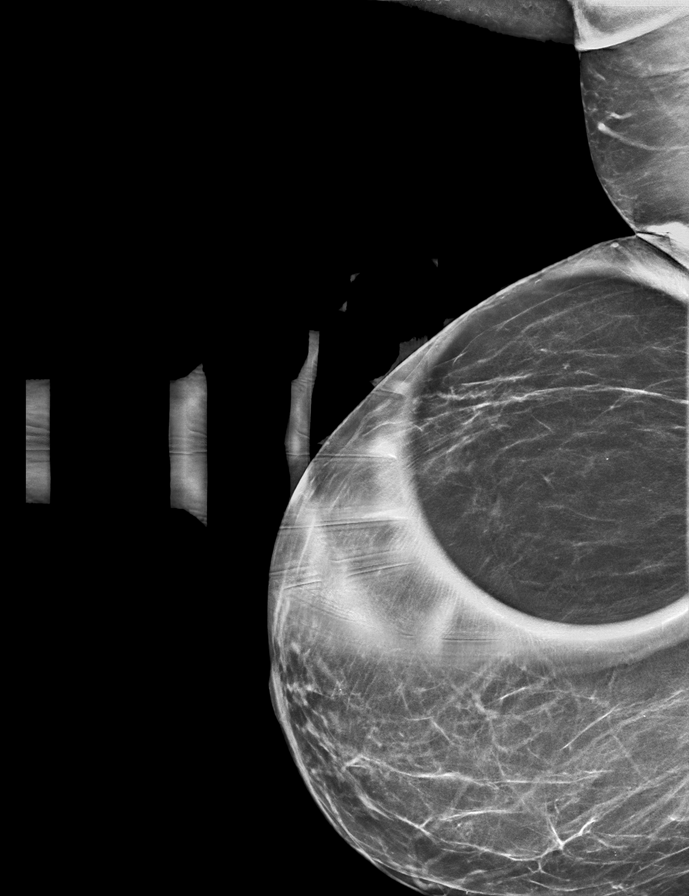

[L MLO synth-2D]
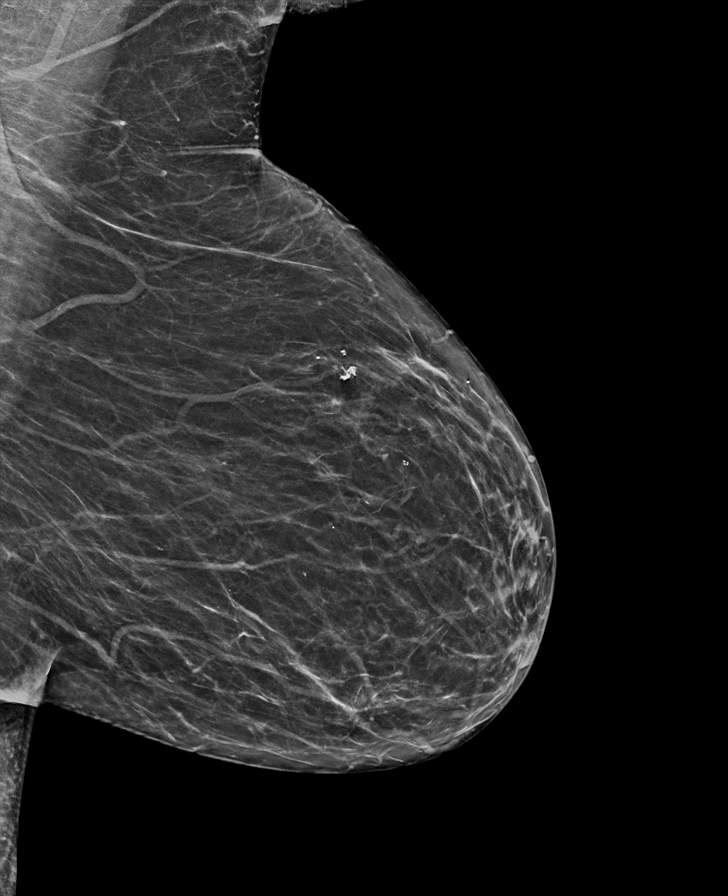

[R ML tomo · tomo slice 31/62.0]
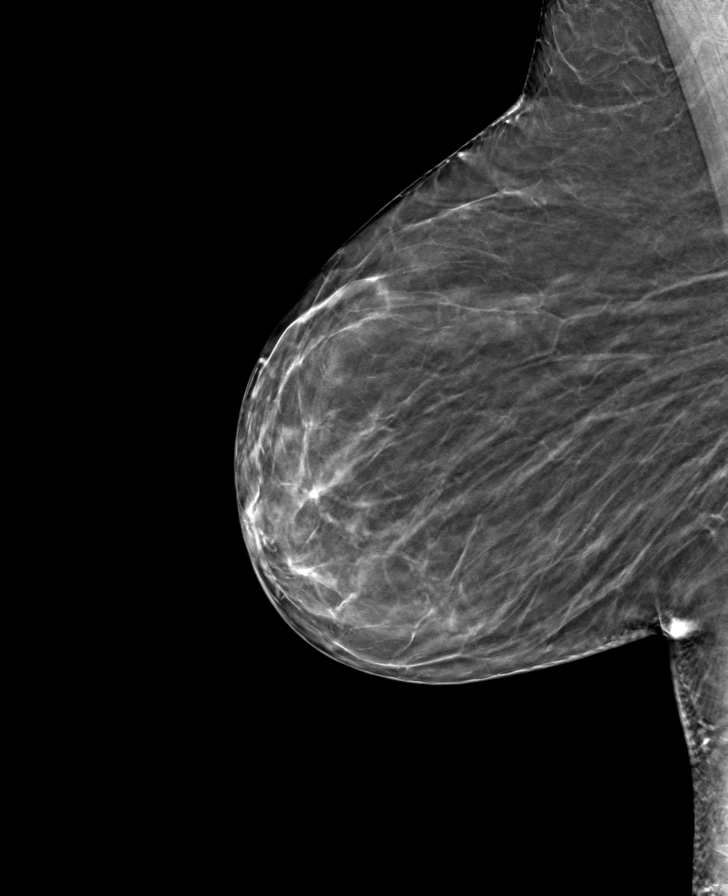

[8 of 40 positions shown; findings below may reference images not displayed]

ACR Breast Density Category b: There are scattered areas of
fibroglandular density.
FINDINGS: No suspicious calcifications, masses or areas of distortion are seen
in the bilateral breasts. An area in the upper-outer quadrant of the
right breast initially appeared to potentially demonstrates
distortion, however spot compression tomosynthesis images resolves
this appearance, which is consistent with overlapping fibroglandular
tissue.

Mammographic images were processed with CAD.
IMPRESSION: 1. No findings in the right breast to explain the patient's nonfocal
right breast pain.

2.  No mammographic evidence of malignancy in the bilateral breasts.

RECOMMENDATION:
Screening mammogram in one year.(Code:[RP])

I have discussed the findings and recommendations with the patient.
If applicable, a reminder letter will be sent to the patient
regarding the next appointment.

BI-RADS CATEGORY  1: Negative.

## 2019-05-27 IMAGING — CR DG THORACIC SPINE 3V
1 series · 3 of 3 positions shown · non-contrast
Comparison: None.

CLINICAL DATA: Thoracic pain.  Right rib pain

EXAM:
THORACIC SPINE - 3 VIEWS

[Series 1: dg thoracic spine w/swimmers · 0.14mm/px · 3 of 3 slices shown]
[im 1/3]
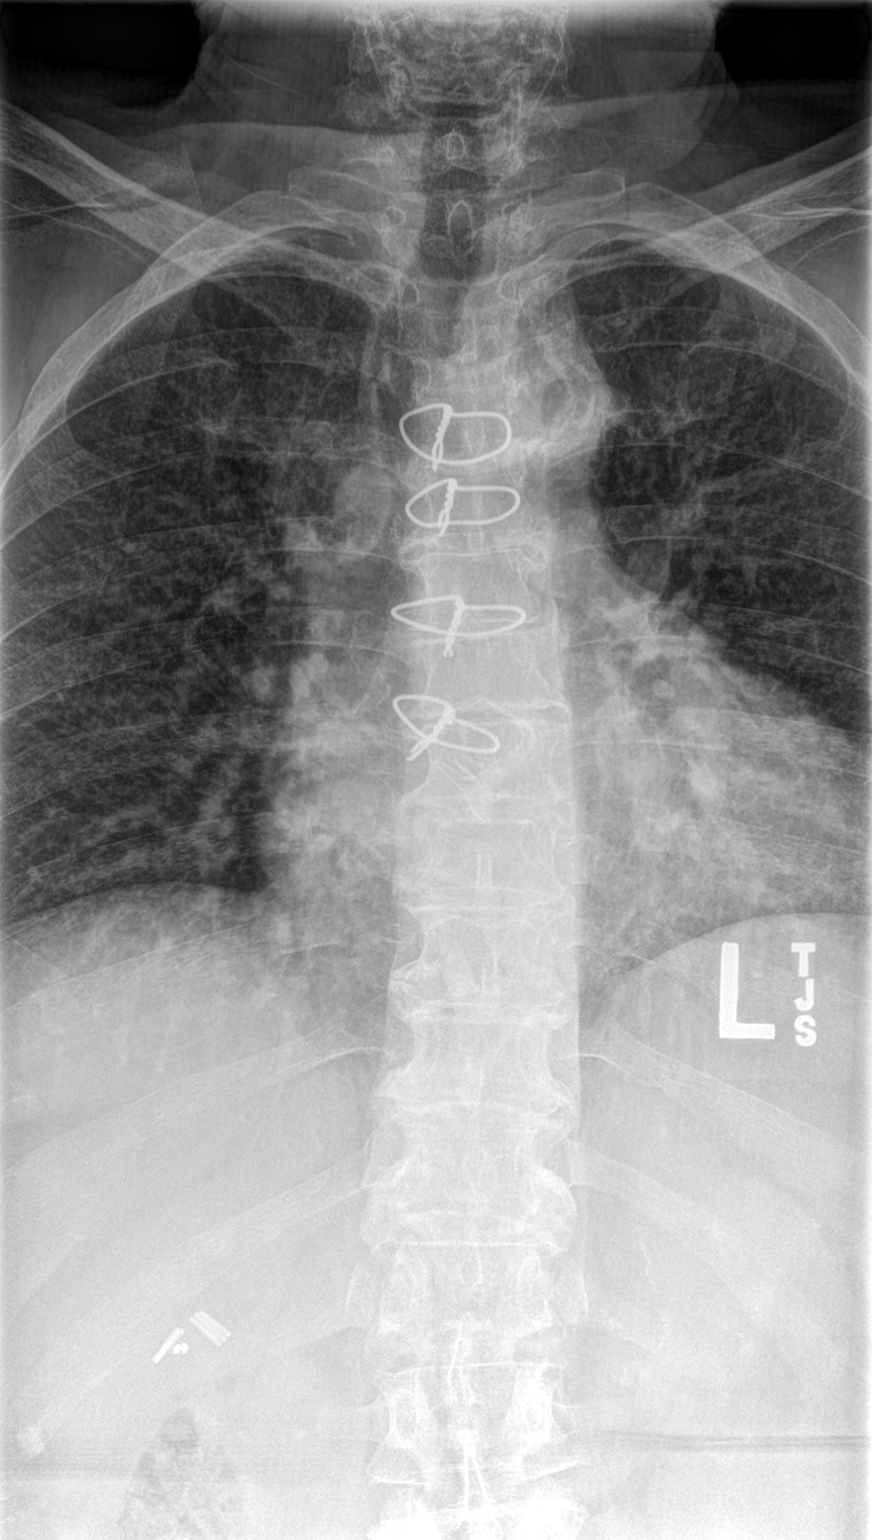
[im 2/3]
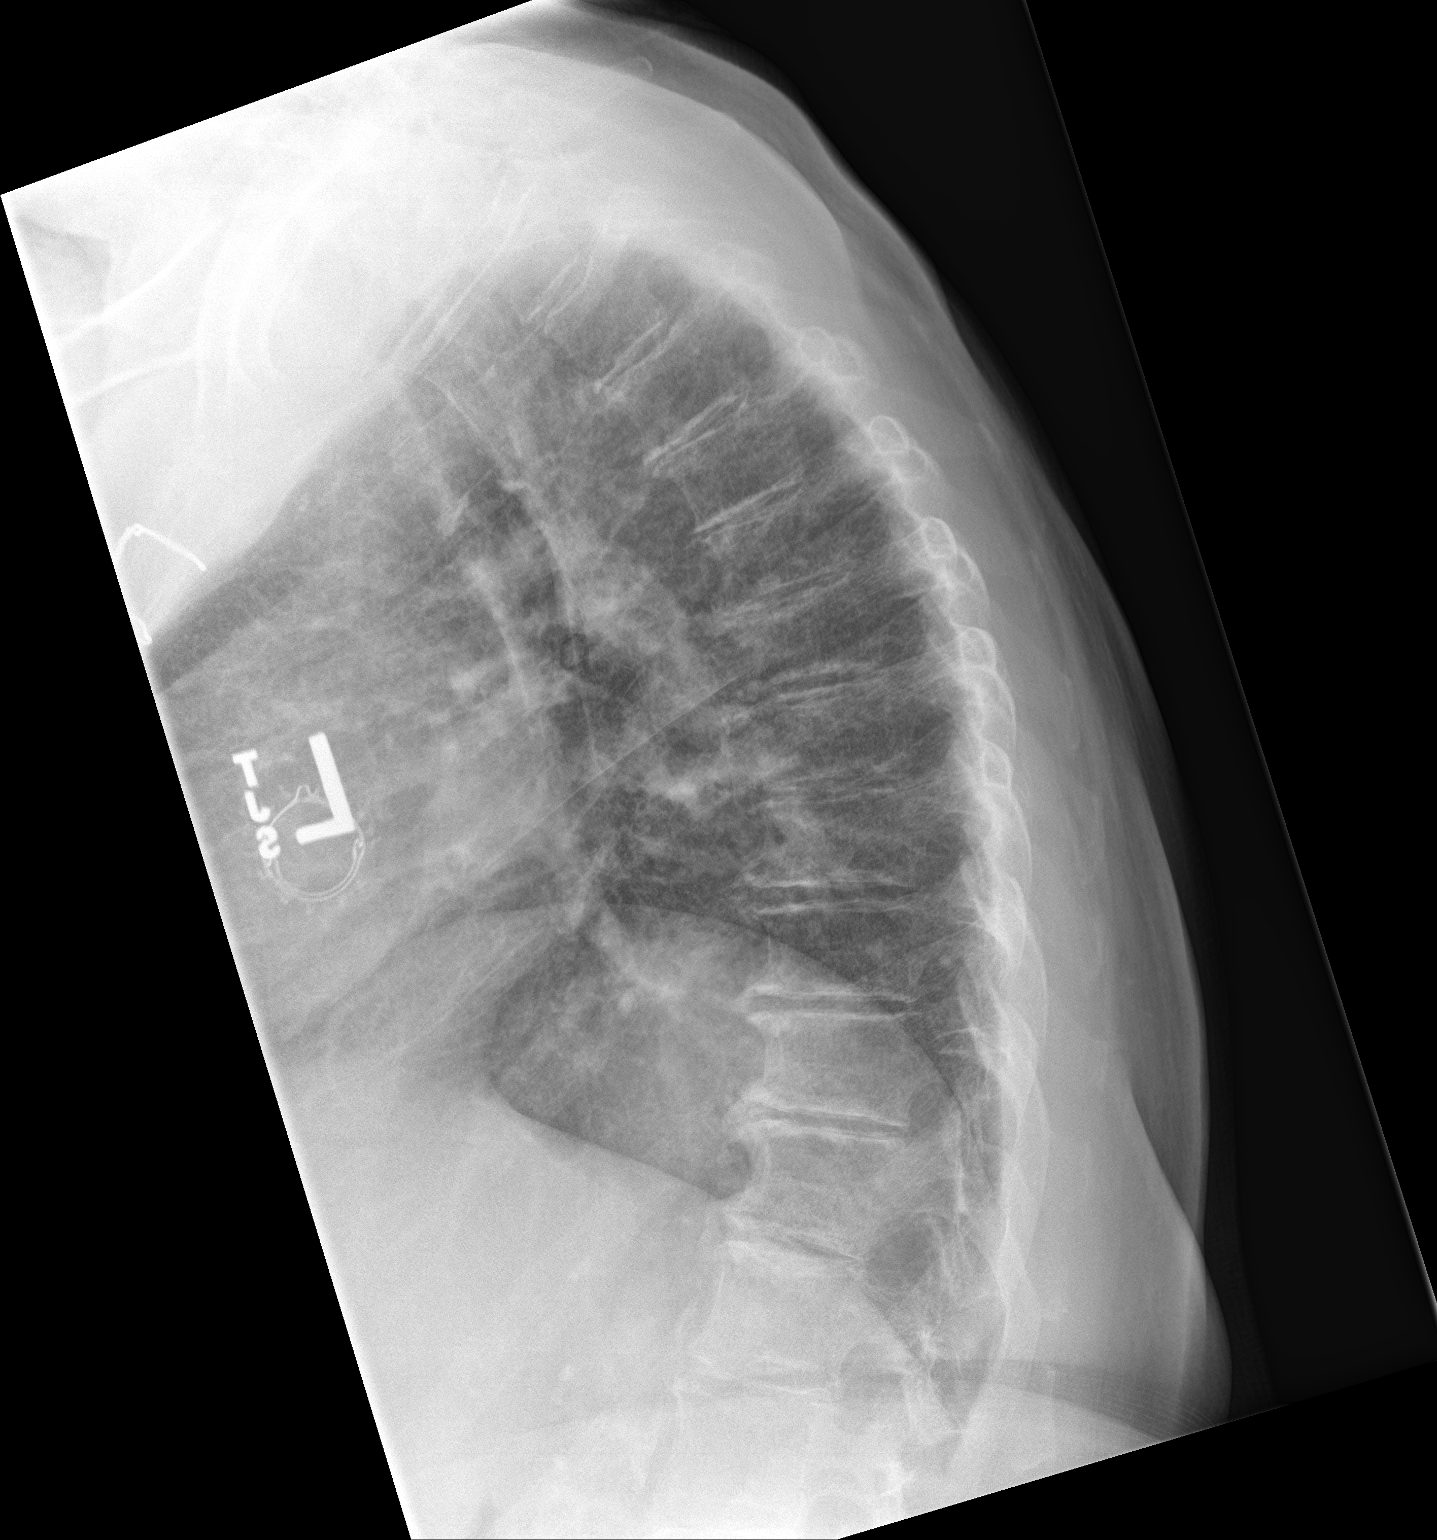
[im 3/3]
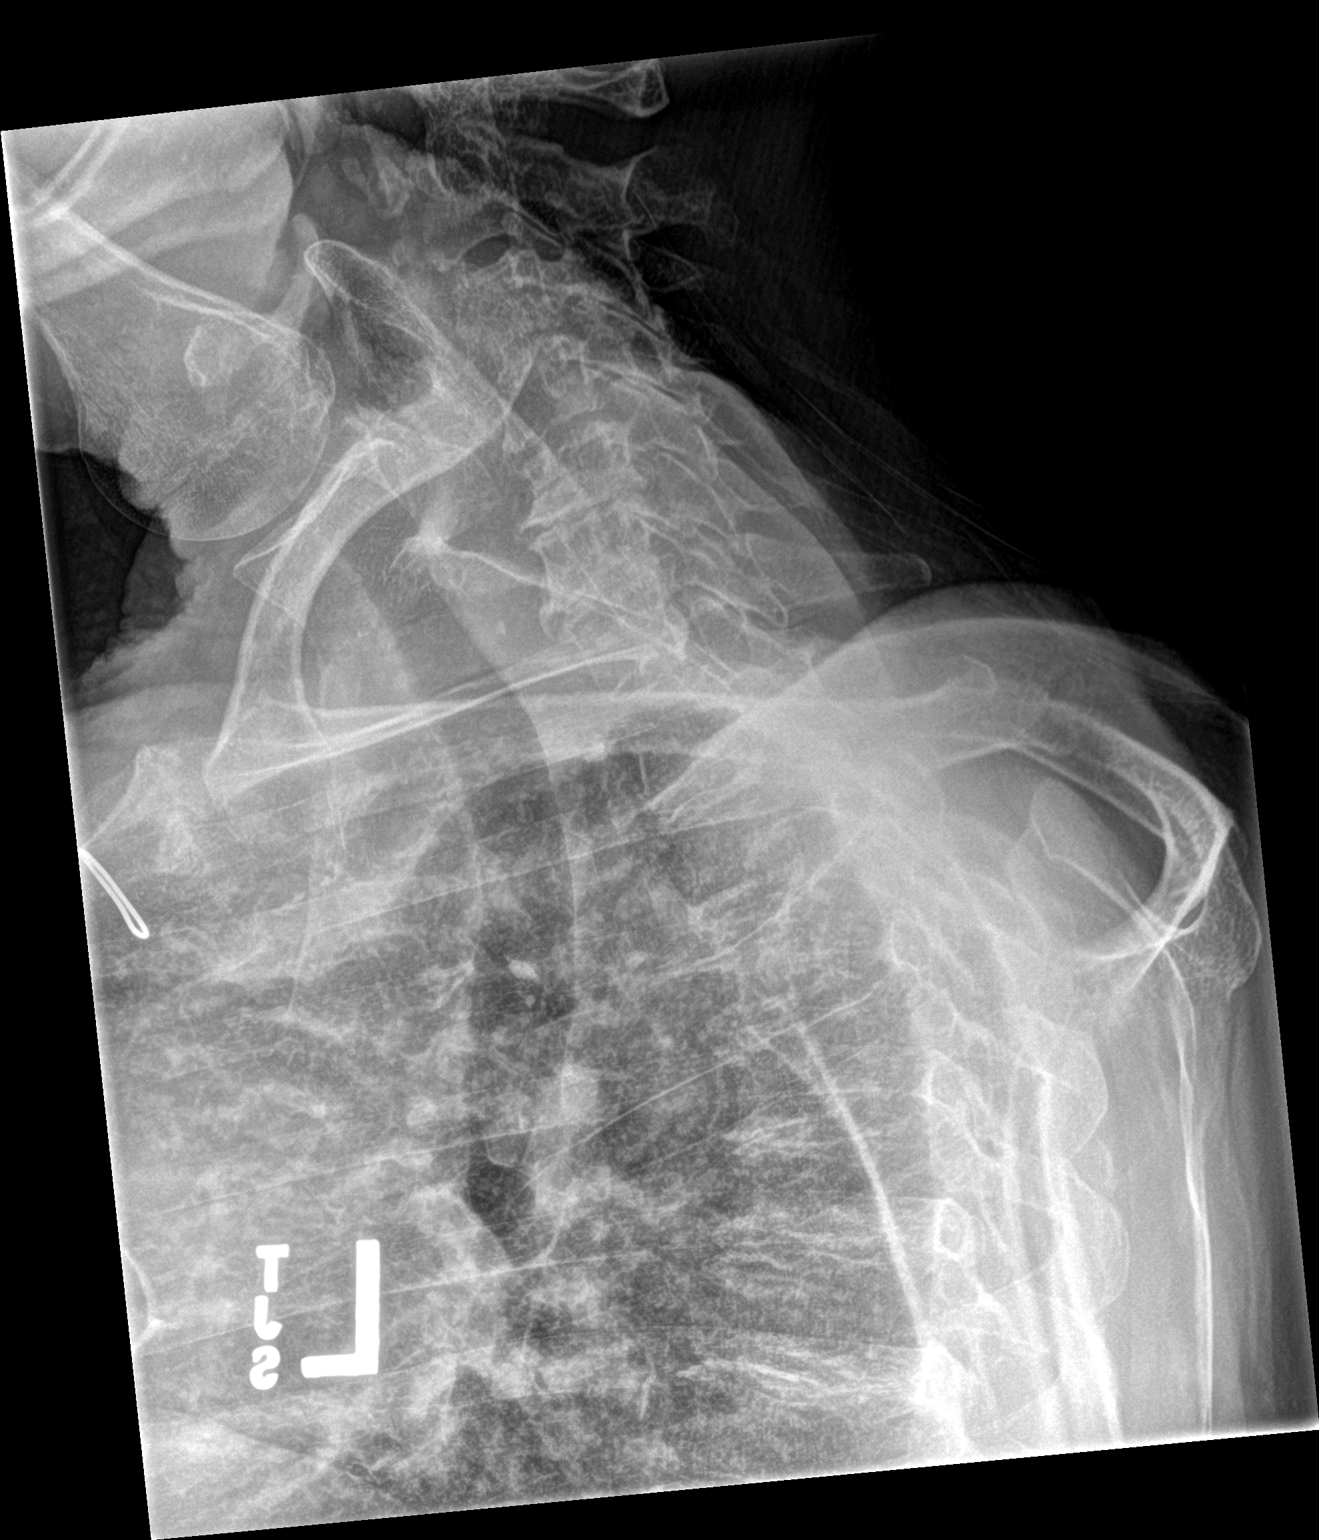

[3 of 3 positions shown; findings below may reference images not displayed]

FINDINGS: There is no evidence of thoracic spine fracture. Alignment is
normal. No other significant bone abnormalities are identified. Mild
degenerative disease with disc height loss of the thoracic spine.

Bilateral diffuse interstitial thickening.
IMPRESSION: No acute osseous injury of the thoracic spine.

## 2019-06-01 ENCOUNTER — Telehealth: Payer: Self-pay | Admitting: Family Medicine

## 2019-06-01 DIAGNOSIS — M5134 Other intervertebral disc degeneration, thoracic region: Secondary | ICD-10-CM

## 2019-06-01 DIAGNOSIS — G8929 Other chronic pain: Secondary | ICD-10-CM

## 2019-06-01 NOTE — Telephone Encounter (Signed)
Patient notified, she would like to have the MRI.

## 2019-06-01 NOTE — Telephone Encounter (Signed)
Please let her know that her x-ray shows some arthritis. I can get her set up for an MRI or we can do some PT. Her choice. Thanks!

## 2019-06-01 NOTE — Telephone Encounter (Signed)
MRI ordered

## 2019-06-03 ENCOUNTER — Ambulatory Visit: Payer: Federal, State, Local not specified - PPO | Admitting: Family Medicine

## 2019-06-03 ENCOUNTER — Telehealth: Payer: Federal, State, Local not specified - PPO | Admitting: Family Medicine

## 2019-06-04 ENCOUNTER — Other Ambulatory Visit: Payer: Self-pay | Admitting: Cardiovascular Disease

## 2019-06-04 DIAGNOSIS — I739 Peripheral vascular disease, unspecified: Secondary | ICD-10-CM

## 2019-06-14 ENCOUNTER — Ambulatory Visit: Payer: Federal, State, Local not specified - PPO | Admitting: Family Medicine

## 2019-06-16 ENCOUNTER — Ambulatory Visit (INDEPENDENT_AMBULATORY_CARE_PROVIDER_SITE_OTHER): Payer: Federal, State, Local not specified - PPO | Admitting: Family Medicine

## 2019-06-16 ENCOUNTER — Other Ambulatory Visit: Payer: Self-pay

## 2019-06-16 ENCOUNTER — Encounter: Payer: Self-pay | Admitting: Family Medicine

## 2019-06-16 VITALS — BP 159/86

## 2019-06-16 DIAGNOSIS — M5134 Other intervertebral disc degeneration, thoracic region: Secondary | ICD-10-CM

## 2019-06-16 DIAGNOSIS — K219 Gastro-esophageal reflux disease without esophagitis: Secondary | ICD-10-CM | POA: Diagnosis not present

## 2019-06-16 MED ORDER — FAMOTIDINE 20 MG PO TABS: 20.0000 mg | ORAL_TABLET | Freq: Every day | ORAL | 3 refills | Status: DC

## 2019-06-16 MED ORDER — LIDOCAINE 5 % EX PTCH: 2.0000 | MEDICATED_PATCH | CUTANEOUS | 12 refills | Status: DC

## 2019-06-16 MED ORDER — NORTRIPTYLINE HCL 50 MG PO CAPS: 50.0000 mg | ORAL_CAPSULE | Freq: Every day | ORAL | 3 refills | Status: DC

## 2019-06-16 NOTE — Progress Notes (Signed)
BP (!) 159/86    Subjective:    Patient ID: Tara [name], female    DOB: [birth year], 73 y.o.   MRN: [id number]  HPI: Tara [name] is a 73 y.o. female  Chief Complaint  Patient presents with  . [name] Pain    Would like a prescription for 5% lidocaine patches 2 a day  . Gastroesophageal Reflux    Refill on pepcid  . Follow-up    pain in right underarm and breast   Tara [name] presents today for follow up on the pain in her [name] and into her R underarm and breast. She notes that it is a little better with the nortriptyline, but not significantly. She notes that she has some numbness on the skin near her breast as well. She has not been having any SOB. No CP, no weakness. Some numbness and tinging. Nothing makes it better. Nothing makes it worse. Pain has been going on for a few months. It's aching and sore and a little numb. It radiates around her breast and into her armpit.  GERD GERD control status: controlled  Satisfied with current treatment? yes Heartburn frequency: rarely if taking medicine Medication side effects: no  Medication compliance: excellent Dysphagia: no Odynophagia:  no Hematemesis: no Blood in stool: no EGD: no   She is otherwise doing well with no other concerns or complaints at this time.   Relevant past medical, surgical, family and social history reviewed and updated as indicated. Interim medical history since our last visit reviewed. Allergies and medications reviewed and updated.  Review of Systems  Constitutional: Negative.   HENT: Negative.   Respiratory: Negative.   Cardiovascular: Negative.   Gastrointestinal: Negative.   Musculoskeletal: Positive for [name] pain and myalgias. Negative for arthralgias, gait problem, joint swelling, neck pain and neck stiffness.  Skin: Negative.   Neurological: Positive for numbness. Negative for dizziness, tremors, seizures, syncope, facial asymmetry, speech difficulty, weakness, light-headedness and  headaches.  Psychiatric/Behavioral: Negative.     Per HPI unless specifically indicated above     Objective:    BP (!) 159/86   Wt Readings from Last 3 Encounters:  05/10/19 174 lb (78.9 kg)  04/29/19 177 lb 8 oz (80.5 kg)  04/09/19 180 lb (81.6 kg)    Physical Exam Vitals signs and nursing note reviewed.  Pulmonary:     Effort: Pulmonary effort is normal. No respiratory distress.     Comments: Speaking in full sentences Neurological:     Mental Status: She is alert.  Psychiatric:        Mood and Affect: Mood normal.        Behavior: Behavior normal.        Thought Content: Thought content normal.        Judgment: Judgment normal.     Results for orders placed or performed during the hospital encounter of 04/09/19  Surgical pathology  Result Value Ref Range   SURGICAL PATHOLOGY      SURGICAL PATHOLOGY CASE: [id number] PATIENT: [facility] Surgical Pathology Report     Specimen Submitted: A. Stomach erythema, erosions, r/o h pylori; cbx B. GEJ, thickening; cbx C. Esophagus, r/o EOE; cbx D. Colon, random, r/o microscopic colitis; cbx  Clinical History: Colonoscopy DX Z12.11 and EGD K22.70.  Gastric erythema and erosions    DIAGNOSIS: A. STOMACH; COLD BIOPSY: - GASTRIC ANTRAL MUCOSA WITH INTESTINAL METAPLASIA AND FEATURES OF REACTIVE GASTROPATHY. - GASTRIC OXYNTIC MUCOSA WITH NO SIGNIFICANT HISTOPATHOLOGIC CHANGE. - NEGATIVE FOR H. PYLORI, DYSPLASIA,  AND MALIGNANCY.  B. GASTROESOPHAGEAL JUNCTION; COLD BIOPSY: - SQUAMOCOLUMNAR MUCOSA WITH FEATURES OF MILD REFLUX GASTROESOPHAGITIS. - NO INCREASE IN INTRAEPITHELIAL EOSINOPHILS (LESS THAN 2 PER HPF). - NEGATIVE FOR INTESTINAL METAPLASIA, DYSPLASIA, AND MALIGNANCY.  C. ESOPHAGUS; COLD BIOPSY: - BENIGN SQUAMOUS MUCOSA WITH FEATURES OF REFLUX ESOPHAGITIS. - NO INCREASE IN INTRAEPITHELIA L EOSINOPHILS (<2 PER HPF). - NEGATIVE FOR DYSPLASIA AND MALIGNANCY.  D. COLON, RANDOM; COLD BIOPSY: - BENIGN  COLONIC MUCOSA WITH NO SIGNIFICANT HISTOPATHOLOGIC CHANGE. - NEGATIVE FOR FEATURES OF MICROSCOPIC COLITIS. - NEGATIVE FOR DYSPLASIA AND MALIGNANCY.  GROSS DESCRIPTION: A. Labeled: C BX gastric erythema and erosion, rule out H. pylori Received: In formalin Tissue fragment(s): 5 Size: From 0.1-0.2 cm Description: Tan soft tissue fragments Entirely submitted in 1 cassette.  B. Labeled: C BX thickening at GE J Received: In formalin Tissue fragment(s): 1 Size: 0.2 cm Description: Tan soft tissue fragment Entirely submitted in 1 cassette.  C. Labeled: C BX esophagus, rule out EOE Received: In formalin Tissue fragment(s): Multiple Size: Aggregate, 0.8 x 0.6 x 0.1 cm Description: Tan soft tissue fragments Entirely submitted in 1 cassette.  D. Labeled: Random colon C BX, rule out microscopic colitis Received: In formalin Tissue fragment(s): Multiple Size : Aggregate, 0.7 x 0.6 x 0.1 cm Description: Tan soft tissue fragments Entirely submitted in 1 cassette.     Final Diagnosis performed by Allena Napoleon, MD.   Electronically signed 04/12/2019 2:47:22PM The electronic signature indicates that the named Attending Pathologist has evaluated the specimen Technical component performed at [facility], [address], [place], [place] [address] Lab: [phone] Dir: Rush Farmer, MD, MMM  Professional component performed at [facility], [facility], [place], [place], [place] [address] Lab: [phone] Dir: Dellia Nims. Reuel Derby, MD       Assessment & Plan:   Problem List Items Addressed This Visit      Digestive   GERD (gastroesophageal reflux disease)    Doing well on current medication. Refills given today. Call with any concerns.       Relevant Medications   famotidine (PEPCID) 20 MG tablet     Musculoskeletal and Integument   Other intervertebral disc degeneration, thoracic region - Primary    Not significantly better with the nortriptyline- but doing a bit  better. Will increase her nortriptyline to 50mg . Rx for lidocaine patches sent to her pharmacy today. Will get her into see physiatry for evaluation. Referral generated today. Call with any concerns.       Relevant Orders   Ambulatory referral to Physical Medicine Rehab       Follow up plan: Return in about 6 weeks (around 07/28/2019).    . This visit was completed via telephone due to the restrictions of the COVID-19 pandemic. All issues as above were discussed and addressed but no physical exam was performed. If it was felt that the patient should be evaluated in the office, they were directed there. The patient verbally consented to this visit. Patient was unable to complete an audio/visual visit due to Lack of equipment. Due to the catastrophic nature of the COVID-19 pandemic, this visit was done through audio contact only. . Location of the patient: home . Location of the provider: home . Those involved with this call:  . Provider: Park Liter, DO . CMA: Tiffany Reel, CMA . Front Desk/Registration: Jill Side  . Time spent on call: 25 minutes on the phone discussing health concerns. 40 minutes total spent in review of patient's record and preparation of their chart.

## 2019-06-21 ENCOUNTER — Encounter: Payer: Self-pay | Admitting: Family Medicine

## 2019-06-21 DIAGNOSIS — K219 Gastro-esophageal reflux disease without esophagitis: Secondary | ICD-10-CM | POA: Insufficient documentation

## 2019-06-21 DIAGNOSIS — M5134 Other intervertebral disc degeneration, thoracic region: Secondary | ICD-10-CM | POA: Insufficient documentation

## 2019-06-21 NOTE — Assessment & Plan Note (Signed)
Doing well on current medication. Refills given today. Call with any concerns.

## 2019-06-21 NOTE — Assessment & Plan Note (Signed)
Not significantly better with the nortriptyline- but doing a bit better. Will increase her nortriptyline to 50mg . Rx for lidocaine patches sent to her pharmacy today. Will get her into see physiatry for evaluation. Referral generated today. Call with any concerns.

## 2019-07-02 ENCOUNTER — Ambulatory Visit (INDEPENDENT_AMBULATORY_CARE_PROVIDER_SITE_OTHER): Payer: Federal, State, Local not specified - PPO | Admitting: Family Medicine

## 2019-07-02 ENCOUNTER — Other Ambulatory Visit: Payer: Self-pay

## 2019-07-02 ENCOUNTER — Encounter: Payer: Self-pay | Admitting: Family Medicine

## 2019-07-02 VITALS — BP 128/65 | Temp 98.8°F

## 2019-07-02 DIAGNOSIS — J0111 Acute recurrent frontal sinusitis: Secondary | ICD-10-CM | POA: Diagnosis not present

## 2019-07-02 DIAGNOSIS — M25512 Pain in left shoulder: Secondary | ICD-10-CM

## 2019-07-02 DIAGNOSIS — R519 Headache, unspecified: Secondary | ICD-10-CM | POA: Diagnosis not present

## 2019-07-02 MED ORDER — PREDNISONE 20 MG PO TABS: 20.0000 mg | ORAL_TABLET | Freq: Every day | ORAL | 0 refills | Status: DC

## 2019-07-02 MED ORDER — AMOXICILLIN-POT CLAVULANATE 875-125 MG PO TABS: 1.0000 | ORAL_TABLET | Freq: Two times a day (BID) | ORAL | 0 refills | Status: DC

## 2019-07-02 NOTE — Progress Notes (Signed)
BP 128/65   Temp 98.8 F (37.1 C)    Subjective:    Patient ID: Tara [name], female    DOB: 07/02/1946, 73 y.o.   MRN: [id number]  HPI: Tara [name] is a 73 y.o. female  Chief Complaint  Patient presents with  . Facial Pain    right side of face, runnu nose and now ear pain  . Numbness    Patient state she had some nubness and tingling in her face and some pain through her shoulder blade, states that she took a nitroglycerin and the pain went away.    UPPER RESPIRATORY TRACT INFECTION Duration: 3-4 days Worst symptom: congestion, headache Fever: no Cough: no Shortness of breath: no Wheezing: no Chest pain: no Chest tightness: no Chest congestion: no Nasal congestion: yes Runny nose: yes Post nasal drip: yes Sneezing: yes Sore throat: no Swollen glands: no Sinus pressure: yes Headache: yes Face pain: yes Toothache: no Ear pain: yes "right Ear pressure: no  Eyes red/itching:no Eye drainage/crusting: no  Vomiting: no Rash: no Fatigue: no Sick contacts: no Strep contacts: no  Context: worse Recurrent sinusitis: no Relief with OTC cold/cough medications: no  Treatments attempted: cold/sinus   Has been having a sharp shooting pain on the R side of face that has been causing her to have numbness in the L side of her face. Has been having some visual changes- blurring, unable to fully describe. She notes that she had some pain in her L shoulder blade that felt like when she has her heart attack. She took a nitro and it went away and did not go to the ER as it did not come back. Pain is better now. No other concerns or complaints at this time.   Relevant past medical, surgical, family and social history reviewed and updated as indicated. Interim medical history since our last visit reviewed. Allergies and medications reviewed and updated.  Review of Systems  Constitutional: Negative.   HENT: Positive for congestion, ear pain, postnasal drip, sinus  pressure and sinus pain. Negative for dental problem, drooling, ear discharge, facial swelling, hearing loss, mouth sores, nosebleeds, rhinorrhea, sneezing, sore throat, tinnitus, trouble swallowing and voice change.   Eyes: Positive for visual disturbance. Negative for photophobia, pain, discharge, redness and itching.  Respiratory: Negative.   Cardiovascular: Negative.   Musculoskeletal: Negative.   Neurological: Positive for light-headedness, numbness and headaches. Negative for dizziness, tremors, seizures, syncope, facial asymmetry, speech difficulty and weakness.  Psychiatric/Behavioral: Negative.     Per HPI unless specifically indicated above     Objective:    BP 128/65   Temp 98.8 F (37.1 C)   Wt Readings from Last 3 Encounters:  05/10/19 174 lb (78.9 kg)  04/29/19 177 lb 8 oz (80.5 kg)  04/09/19 180 lb (81.6 kg)    Physical Exam  Results for orders placed or performed during the hospital encounter of 04/09/19  Surgical pathology  Result Value Ref Range   SURGICAL PATHOLOGY      SURGICAL PATHOLOGY CASE: [id number] PATIENT: [facility]     Specimen Submitted: A. Stomach erythema, erosions, r/o h pylori; cbx B. GEJ, thickening; cbx C. Esophagus, r/o EOE; cbx D. Colon, random, r/o microscopic colitis; cbx  Clinical History: Colonoscopy DX Z12.11 and EGD K22.70.  Gastric erythema and erosions    DIAGNOSIS: A. STOMACH; COLD BIOPSY: - GASTRIC ANTRAL MUCOSA WITH INTESTINAL METAPLASIA AND FEATURES OF REACTIVE GASTROPATHY. - GASTRIC OXYNTIC MUCOSA WITH NO SIGNIFICANT HISTOPATHOLOGIC CHANGE. -  NEGATIVE FOR H. PYLORI, DYSPLASIA, AND MALIGNANCY.  B. GASTROESOPHAGEAL JUNCTION; COLD BIOPSY: - SQUAMOCOLUMNAR MUCOSA WITH FEATURES OF MILD REFLUX GASTROESOPHAGITIS. - NO INCREASE IN INTRAEPITHELIAL EOSINOPHILS (LESS THAN 2 PER HPF). - NEGATIVE FOR INTESTINAL METAPLASIA, DYSPLASIA, AND MALIGNANCY.  C. ESOPHAGUS; COLD BIOPSY: - BENIGN  SQUAMOUS MUCOSA WITH FEATURES OF REFLUX ESOPHAGITIS. - NO INCREASE IN INTRAEPITHELIA L EOSINOPHILS (<2 PER HPF). - NEGATIVE FOR DYSPLASIA AND MALIGNANCY.  D. COLON, RANDOM; COLD BIOPSY: - BENIGN COLONIC MUCOSA WITH NO SIGNIFICANT HISTOPATHOLOGIC CHANGE. - NEGATIVE FOR FEATURES OF MICROSCOPIC COLITIS. - NEGATIVE FOR DYSPLASIA AND MALIGNANCY.  GROSS DESCRIPTION: A. Labeled: C BX gastric erythema and erosion, rule out H. pylori Received: In formalin Tissue fragment(s): 5 Size: From 0.1-0.2 cm Description: Tan soft tissue fragments Entirely submitted in 1 cassette.  B. Labeled: C BX thickening at GE J Received: In formalin Tissue fragment(s): 1 Size: 0.2 cm Description: Tan soft tissue fragment Entirely submitted in 1 cassette.  C. Labeled: C BX esophagus, rule out EOE Received: In formalin Tissue fragment(s): Multiple Size: Aggregate, 0.8 x 0.6 x 0.1 cm Description: Tan soft tissue fragments Entirely submitted in 1 cassette.  D. Labeled: Random colon C BX, rule out microscopic colitis Received: In formalin Tissue fragment(s): Multiple Size : Aggregate, 0.7 x 0.6 x 0.1 cm Description: Tan soft tissue fragments Entirely submitted in 1 cassette.     Final Diagnosis performed by Allena Napoleon, MD.   Electronically signed 04/12/2019 2:47:22PM The electronic signature indicates that the named Attending Pathologist has evaluated the specimen Technical component performed at [facility], [address] [place], [place] [address] Lab: [phone] Dir: Rush Farmer, MD, MMM  Professional component performed at [facility], [facility], [place], [place], [place] [address] Lab: [phone] Dir: Dellia Nims. Reuel Derby, MD       Assessment & Plan:   Problem List Items Addressed This Visit    None    Visit Diagnoses    Temporal pain    -  Primary   Shooting pain, numbness and visual changes. 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 Will start prednisone, check  labs on Monday and refer to surgery for bx.   Relevant Orders   Sed Rate (ESR)   Comp Met (CMET)   CBC with Differential OUT   Ambulatory referral to General Surgery   Acute recurrent frontal sinusitis       Will treat with augmentin. Call with any concerns.    Relevant Medications   predniSONE (DELTASONE) 20 MG tablet   amoxicillin-clavulanate (AUGMENTIN) 875-125 MG tablet   Acute pain of left shoulder       If comes back GO TO ER for cardiac eval.       Follow up plan: Return in about 1 week (around 07/09/2019).    . This visit was completed via FaceTime due to the restrictions of the COVID-19 pandemic. All issues as above were discussed and addressed. Physical exam was done as above through visual confirmation on FaceTime. If it was felt that the patient should be evaluated in the office, they were directed there. The patient verbally consented to this visit. . Location of the patient: home . Location of the provider: work . Those involved with this call:  . Provider: Park Liter, DO . CMA: Tiffany Reel, CMA . Front Desk/Registration: Don Perking  . Time spent on call: 25 minutes with patient face to face via video conference. More than 50% of this time was spent in counseling and coordination of care. 40 minutes total  spent in review of patient's record and preparation of their chart.

## 2019-07-04 ENCOUNTER — Encounter: Payer: Self-pay | Admitting: Family Medicine

## 2019-07-05 ENCOUNTER — Other Ambulatory Visit: Payer: Self-pay

## 2019-07-05 ENCOUNTER — Other Ambulatory Visit: Payer: Federal, State, Local not specified - PPO

## 2019-07-05 ENCOUNTER — Other Ambulatory Visit: Payer: Self-pay | Admitting: Family Medicine

## 2019-07-05 DIAGNOSIS — R519 Headache, unspecified: Secondary | ICD-10-CM

## 2019-07-05 DIAGNOSIS — R739 Hyperglycemia, unspecified: Secondary | ICD-10-CM

## 2019-07-05 LAB — BAYER DCA HB A1C WAIVED: HB A1C (BAYER DCA - WAIVED): 5.8 % (ref <7.0)

## 2019-07-06 ENCOUNTER — Telehealth: Payer: Self-pay | Admitting: Family Medicine

## 2019-07-06 ENCOUNTER — Telehealth: Payer: Self-pay | Admitting: Cardiovascular Disease

## 2019-07-06 LAB — CBC WITH DIFFERENTIAL/PLATELET
Basophils Absolute: 0 10*3/uL (ref 0.0–0.2)
Basos: 0 %
EOS (ABSOLUTE): 0.1 10*3/uL (ref 0.0–0.4)
Eos: 1 %
Hematocrit: 47.2 % — ABNORMAL HIGH (ref 34.0–46.6)
Hemoglobin: 15.4 g/dL (ref 11.1–15.9)
Immature Grans (Abs): 0 10*3/uL (ref 0.0–0.1)
Immature Granulocytes: 0 %
Lymphocytes Absolute: 2.4 10*3/uL (ref 0.7–3.1)
Lymphs: 33 %
MCH: 28.8 pg (ref 26.6–33.0)
MCHC: 32.6 g/dL (ref 31.5–35.7)
MCV: 88 fL (ref 79–97)
Monocytes Absolute: 0.6 10*3/uL (ref 0.1–0.9)
Monocytes: 8 %
Neutrophils Absolute: 4.3 10*3/uL (ref 1.4–7.0)
Neutrophils: 58 %
Platelets: 177 10*3/uL (ref 150–450)
RBC: 5.35 x10E6/uL — ABNORMAL HIGH (ref 3.77–5.28)
RDW: 14.1 % (ref 11.7–15.4)
WBC: 7.4 10*3/uL (ref 3.4–10.8)

## 2019-07-06 LAB — SEDIMENTATION RATE: Sed Rate: 21 mm/hr (ref 0–40)

## 2019-07-06 LAB — COMPREHENSIVE METABOLIC PANEL
ALT: 22 IU/L (ref 0–32)
AST: 20 IU/L (ref 0–40)
Albumin/Globulin Ratio: 1.9 (ref 1.2–2.2)
Albumin: 4 g/dL (ref 3.7–4.7)
Alkaline Phosphatase: 111 IU/L (ref 39–117)
BUN/Creatinine Ratio: 30 — ABNORMAL HIGH (ref 12–28)
BUN: 25 mg/dL (ref 8–27)
Bilirubin Total: 0.3 mg/dL (ref 0.0–1.2)
CO2: 27 mmol/L (ref 20–29)
Calcium: 9.2 mg/dL (ref 8.7–10.3)
Chloride: 104 mmol/L (ref 96–106)
Creatinine, Ser: 0.84 mg/dL (ref 0.57–1.00)
GFR calc Af Amer: 81 mL/min/{1.73_m2} (ref >59)
GFR calc non Af Amer: 70 mL/min/{1.73_m2} (ref >59)
Globulin, Total: 2.1 g/dL (ref 1.5–4.5)
Glucose: 167 mg/dL — ABNORMAL HIGH (ref 65–99)
Potassium: 4.1 mmol/L (ref 3.5–5.2)
Sodium: 142 mmol/L (ref 134–144)
Total Protein: 6.1 g/dL (ref 6.0–8.5)

## 2019-07-06 NOTE — Telephone Encounter (Signed)
Pt returned call to office/ please advise

## 2019-07-06 NOTE — Telephone Encounter (Signed)
Spoke with the patient. Patient denies chest pain. Pain sts that she does have a hx of MI back in 2008. Patient sts that she had an episode of left shoulder pain and left arm pain on 06/26/19. She denies sob, n/v diaphoresis, cp. Patient sts that the episode was similar to what she had with her previous MI. Patient sts that she did take 2 Nitro and that discomfort resolved.  Patient is currently asymptomatic. She has not had any reoccurrence of symptoms and has not had to use Nitro. Advised the patient to keep her scheduled appt for 07/08/19. Advised the patient to use Nitro if needed and to contact EMS if her symptoms are not relieved by the second Nitro. Patient understands to seek emergent for cardiac event symptoms.  Patient verbalized understanding and voiced appreciation for the call back.

## 2019-07-06 NOTE — Telephone Encounter (Signed)
Please let Tara [name] know that her labs came back normal. I still want her to see the surgeon given her symptoms. How is she feeling on the prednisone?

## 2019-07-06 NOTE — Telephone Encounter (Signed)
Patient notified of Dr. Durenda Age message. Patient states she is feeling better on the prednisone.

## 2019-07-06 NOTE — Telephone Encounter (Signed)
Patient calling to schedule asap appt.    Denies CP  C/o pain in shoulder blade L arm and sweating x one episode with interim arm pain .  Episode a couple days ago.  Scheduled with caitlin Walker 12/17 at 130 added to waitlist.    Patient states she is no wanting to go to ED  ?

## 2019-07-06 NOTE — Telephone Encounter (Signed)
Called and left patient a VM asking for her to please return my call.  

## 2019-07-07 ENCOUNTER — Encounter: Payer: Self-pay | Admitting: Family Medicine

## 2019-07-07 NOTE — Progress Notes (Signed)
LVM to make f/u appointment.Sent letter.

## 2019-07-08 ENCOUNTER — Ambulatory Visit: Payer: Federal, State, Local not specified - PPO | Admitting: Family

## 2019-07-08 NOTE — Progress Notes (Deleted)
Office Visit    Patient Name: Tara [name] Date of Encounter: 07/08/2019  Primary Care Provider:  Dorcas Carrow, DO Primary Cardiologist:  No primary care provider on file. Electrophysiologist:  None   Chief Complaint    Tara [name] is a 73 y.o. female with a hx of CAD as/P stent placement in [place], aortic valve replacement with tissue valve 2016 in [place] for AI, tobacco use, HTN, HLD presents today for shoulder pain.  Past Medical History    Past Medical History:  Diagnosis Date  . Anxiety   . Barrett's syndrome   . Depression   . Heart murmur   . Hyperlipidemia   . Hypertension   . Myocardial infarction [facility])    08   Past Surgical History:  Procedure Laterality Date  . ABDOMINAL HYSTERECTOMY    . BREAST BIOPSY Left 2007   Neg- [place]  . CARDIAC CATHETERIZATION    . CARDIAC VALVE REPLACEMENT    . COLON SURGERY    . COLONOSCOPY WITH PROPOFOL N/A 04/09/2019   Procedure: COLONOSCOPY WITH PROPOFOL;  Surgeon: Pasty Spillers, MD;  Location: [facility] ENDOSCOPY;  Service: Endoscopy;  Laterality: N/A;  . CORONARY ANGIOPLASTY WITH STENT PLACEMENT  2008   [place]; [facility] Stent placement x 1   . ESOPHAGOGASTRODUODENOSCOPY (EGD) WITH PROPOFOL N/A 04/09/2019   Procedure: ESOPHAGOGASTRODUODENOSCOPY (EGD) WITH PROPOFOL;  Surgeon: Pasty Spillers, MD;  Location: [facility] ENDOSCOPY;  Service: Endoscopy;  Laterality: N/A;  . EXCISIONAL HEMORRHOIDECTOMY    . EYE SURGERY    . HERNIA REPAIR      Allergies  No Known Allergies  History of Present Illness    Tara [name] is a 73 y.o. 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 Kirke Corin.  She smokes 3 cigarettes/day and has been unable to quit since her son committed suicide a few years ago.  Noted history of PAD and patient reports stent  placement to her left femoral artery region, details unknown.  Recent cardiac [facility] 3/2020echo normal LV the EF with normal functioning bioprosthetic aortic valve with mean gradient of 15 mmHg.  Lexiscan Myoview showed no evidence of ischemia, EF 74%.  Lower extremity arterial Doppler showed significant left SFA disease.  At her last visit with Dr. Kirke Corin she was recommended for lower extremity arterial Doppler.  This has been scheduled for 07/13/2019.  She called the office 07/06/2019 requesting a appointment for pain in her shoulder blade, left arm and sweating x1 episode with intermittent arm pain.  She reported no shortness of breath, nausea vomiting, diaphoresis.  Patient states that the episode was similar to previous MI and her chest discomfort resolved with 2 nitroglycerin.   EKGs/Labs/Other Studies Reviewed:   The following studies were reviewed today:  Echo 10/16/2018 Summary:  1. There is normal left ventricular systolic function. The ejection fraction is estimated to be 65%. Global left ventricular wall motion and contractility are within normal limits. 2. Abnormal left ventricular diastolic filling is observed, consistent with impaired LV relaxation(Stage I). 3. The right ventricular global systolic function is normal. 4. A bovine bio-prosthetic aortic valve is present. The mean gradient across the aortic valve is .. Dimensionless index = ;  accleration time - 0.66 5. There is mild to moderate mitral annular calcification.  Lexiscan Myoview  10/19/2018 Summary:  1. Normal regadenoson myocardial perfusion with Tc-22m sestamibi imaging. 2. Normal LV perfusion. Global left ventricular systolic function was normal, with an EF of 74% with normal wall motion 3. Stress testing induced no chest pain symptoms and no ECG changes consistent with ischemia. There was a normal heart rate response, with a normal blood pressure response.  Nuclear  Cardiology Conclusion: Normal regadenoson myocardial perfusion with Tc-76m sestamibi imaging. Stress testing induced no chest pain symptoms and no ECG changes consistent with ischemia. Normal LV size. Global left ventricular systolic function was normal, with an EF of 74%. In addition, there was normal wall motion.   Stress Test Conclusion: Normal pharmacologicNormal pharmacologic stress test. with no ECG changes consistent with ischemia. There was a normal heart rate response, with a normal blood pressure response. The patient experienced no chest pain. The test was terminated due to completion of vasodilator infusion. Dyspnea and headache with injection.  Baseline ECG: Normal sinus rhythm. Nonspecific ST changes.  Pharmacologic Protocol: A 400 mcg dose of Regadenoson was administered by intravenous bolus injection.  Stress ECG : Normal sinus rhythm. No ST-segment depression under pharmacologic stress.  Recovery ECG: Normal sinus rhythm. ST segment response during recovery was normal.   EKG:  EKG is ordered today.  The ekg ordered today demonstrates ***  Recent Labs: 02/05/2019: TSH 2.380 07/05/2019: ALT 22; BUN 25; Creatinine, Ser 0.84; Hemoglobin 15.4; Platelets 177; Potassium 4.1; Sodium 142  Recent Lipid Panel    Component Value Date/Time   CHOL 217 (H) 02/05/2019 1619   TRIG 199 (H) 02/05/2019 1619   HDL 40 02/05/2019 1619   LDLCALC 137 (H) 02/05/2019 1619    Home Medications   No outpatient medications have been marked as taking for the 07/08/19 encounter (Appointment) with Loel Dubonnet, NP.    Review of Systems    ***   ROS All other systems reviewed and are otherwise negative except as noted above.  Physical Exam    VS:  There were no vitals taken for this visit. , BMI There is no height or weight on file to calculate BMI. GEN: Well nourished, well developed, in no acute distress. HEENT: normal. Neck: Supple, no JVD, carotid bruits, or  masses. Cardiac: ***RRR, no murmurs, rubs, or gallops. No clubbing, cyanosis, edema.  ***Radials/DP/PT 2+ and equal bilaterally.  Respiratory:  ***Respirations regular and unlabored, clear to auscultation bilaterally. GI: Soft, nontender, nondistended, BS + x 4. MS: No deformity or atrophy. Skin: Warm and dry, no rash. Neuro:  Strength and sensation are intact. Psych: Normal affect.  Accessory Clinical Findings    ECG personally reviewed by me today - *** - no acute changes.  Assessment & Plan    1. CAD -  2. Right calf claudication -abnormal vascular exam.  She has been scheduled for lower extremity arterial Doppler on 07/13/2019. 3. Prostatic aortic valve replacement 2016 -  4. HTN -  5. Tobacco abuse -   Disposition: Follow up {follow up:15908} with ***   Loel Dubonnet, NP 07/08/2019, 1:23 PM

## 2019-07-09 ENCOUNTER — Ambulatory Visit: Payer: Federal, State, Local not specified - PPO | Admitting: Surgery

## 2019-07-09 ENCOUNTER — Other Ambulatory Visit
Admission: RE | Admit: 2019-07-09 | Discharge: 2019-07-09 | Disposition: A | Discharge status: Home / Self Care | Payer: Federal, State, Local not specified - PPO | Source: Ambulatory Visit | Attending: Surgery | Admitting: Surgery

## 2019-07-09 ENCOUNTER — Other Ambulatory Visit: Payer: Self-pay

## 2019-07-09 ENCOUNTER — Encounter: Payer: Self-pay | Admitting: Surgery

## 2019-07-09 VITALS — BP 154/85 | HR 86 | Temp 97.2°F | Ht 64.0 in | Wt 180.8 lb

## 2019-07-09 DIAGNOSIS — Z20828 Contact with and (suspected) exposure to other viral communicable diseases: Secondary | ICD-10-CM | POA: Diagnosis not present

## 2019-07-09 DIAGNOSIS — M316 Other giant cell arteritis: Secondary | ICD-10-CM

## 2019-07-09 DIAGNOSIS — Z01812 Encounter for preprocedural laboratory examination: Secondary | ICD-10-CM | POA: Diagnosis not present

## 2019-07-09 NOTE — Patient Instructions (Addendum)
You will have surgery at [facility] on 07/12/19. You will arrive there and go in through the [facility] entrance at 10:00 am. You will need to have a Covid-19 test done today at the [facility] up testing. Nothing to eat after midnight the night prior to surgery. We will call you with your medication instructions.   Temporal Artery Biopsy  Arteries are blood vessels that carry blood from the heart to the rest of the body. Temporal arteries are found on the side of the head and between the ears and eyes. During a temporal artery biopsy, a sample of the temporal artery is removed and examined under a microscope. This procedure may be done to see if you have a condition that causes your arteries to become swollen or inflamed (temporal arteritis or giant cell arteritis). Tell a health care provider about:  Any allergies you have.  All medicines you are taking, including vitamins, herbs, eye drops, creams, and over-the-counter medicines.  Any problems you or family members have had with anesthetic medicines.  Any blood disorders you have.  Any surgeries you have had.  Any medical conditions you have or have had.  Whether you are pregnant or may be pregnant. What are the risks? Generally, this is a safe procedure. However, problems may occur, including:  Bleeding.  A collection of blood under the skin (hematoma).  Infection.  Nerve damage in the temples. This can cause numbness or make the muscles in your face weak.  Scarring. On the scalp, hair may not grow around the scar. What happens before the procedure?  You may have blood tests to make sure that your blood clots normally.  Ask your health care provider about: ? Changing or stopping your regular medicines. This is especially important if you are taking diabetes medicines or blood thinners. ? Taking medicines such as aspirin and ibuprofen. These medicines can thin your blood. Do not take these medicines unless your  health care provider tells you to take them. ? Taking over-the-counter medicines, vitamins, herbs, and supplements.  Follow instructions from your health care provider about eating or drinking restrictions.  Plan to have someone take you home from the hospital or clinic.  If you will be going home right after the procedure, plan to have someone with you for 24 hours.  Ask your health care provider: ? How your surgery site will be marked. ? What steps will be taken to help prevent infection. These may include:  Removing hair at the surgery site.  Washing skin with a germ-killing soap.  Taking antibiotic medicine. What happens during the procedure?  Small monitors will be put on your body. They will be used to check your heart, blood pressure, and oxygen level.  An IV will be inserted into one of your veins.  You will be given one or more of the following: ? A medicine to help you relax (sedative). ? A medicine to numb the area (local anesthetic).  A tool called a Doppler may be used to find the artery.  An incision will be made over the temporal artery.  Two clamps will be placed on the artery. Then, a small piece of the artery between the clamps will be cut and removed.  The remaining ends of the artery will be closed with stitches (sutures) to avoid bleeding. The clamps will then be removed.  The incision will be closed with sutures.  A bandage (dressing) may be placed on the incision.  The artery piece  that was taken out will be checked under a microscope. The procedure may vary among health care providers and hospitals. What happens after the procedure?  Your blood pressure, heart rate, breathing rate, and blood oxygen level may be monitored until you leave the hospital or clinic.  You may be given medicine for pain.  Do not drive for 24 hours if you were given a sedative during your procedure. Summary  During a temporal artery biopsy, a sample of the temporal  artery is removed and examined under a microscope. This procedure may be done to see if you have a condition that causes your arteries to become swollen or inflamed (temporal arteritis or giant cell arteritis).  Before the procedure, follow instructions from your health care provider about changing or stopping your medicines and about any eating or drinking restrictions.  Plan to have someone take you home after the procedure and have someone with you for 24 hours.  During the procedure, the incision will be closed with sutures, and a bandage (dressing) may be placed on the incision. This information is not intended to replace advice given to you by your health care provider. Make sure you discuss any questions you have with your health care provider. Document Released: 06/26/2009 Document Revised: 04/01/2018 Document Reviewed: 04/01/2018 Elsevier Patient Education  2020 [employer].

## 2019-07-09 NOTE — Progress Notes (Signed)
Left message for patient to return call about surgery time for Monday.

## 2019-07-09 NOTE — H&P (View-Only) (Signed)
07/09/2019  Reason for Visit:  Temporal arteritis  Referring Provider:  Megan Johnson, DO  History of Present Illness: Tara [name] is a 73 y.o. female presenting for evaluation for temporal artery biopsy.  Patient reports that she has had issues with bilateral jaw pain but the right being worse than the left for about 2 months.  She also has been having issues with intermittent posterior headaches on both sides are of short duration as well as issues with visual disturbances and double vision on the right eye particularly.  She also reports that intermittently she will have numbness going from the middle portion of her face towards the left cheek that is intermittent in nature.  She denies any nausea, vomiting, vertigo.  She was seen by her PCP on 07/02/2019 and was started on prednisone with high suspicion for temporal arteritis.  She was referred to us for biopsy for pathologic confirmation.   Past Medical History: Past Medical History:  Diagnosis Date  . Anxiety   . Barrett's syndrome   . Depression   . Heart murmur   . Hyperlipidemia   . Hypertension   . Myocardial infarction (HCC)    08     Past Surgical History: Past Surgical History:  Procedure Laterality Date  . ABDOMINAL HYSTERECTOMY    . BREAST BIOPSY Left 2007   Neg- [place]  . CARDIAC CATHETERIZATION    . CARDIAC VALVE REPLACEMENT    . COLON SURGERY    . COLONOSCOPY WITH PROPOFOL N/A 04/09/2019   Procedure: COLONOSCOPY WITH PROPOFOL;  Surgeon: Tahiliani, Varnita B, MD;  Location: [facility] ENDOSCOPY;  Service: Endoscopy;  Laterality: N/A;  . CORONARY ANGIOPLASTY WITH STENT PLACEMENT  2008   [place]; [facility] Stent placement x 1   . ESOPHAGOGASTRODUODENOSCOPY (EGD) WITH PROPOFOL N/A 04/09/2019   Procedure: ESOPHAGOGASTRODUODENOSCOPY (EGD) WITH PROPOFOL;  Surgeon: Tahiliani, Varnita B, MD;  Location: [facility] ENDOSCOPY;  Service: Endoscopy;  Laterality: N/A;  . EXCISIONAL HEMORRHOIDECTOMY    . EYE SURGERY     . HERNIA REPAIR      Home Medications: Prior to Admission medications   Medication Sig Start Date End Date Taking? Authorizing Provider  ALPRAZolam (XANAX) 0.5 MG tablet Take 1 tablet (0.5 mg total) by mouth daily as needed for anxiety. 04/22/19  Yes Johnson, Megan P, DO  amoxicillin-clavulanate (AUGMENTIN) 875-125 MG tablet Take 1 tablet by mouth 2 (two) times daily. 07/02/19  Yes Johnson, Megan P, DO  aspirin EC 325 MG tablet Take 1 tablet by mouth daily.   Yes [provider]  atorvastatin (LIPITOR) 40 MG tablet Take 40 mg by mouth daily.   Yes [provider]  citalopram (CELEXA) 10 MG tablet Take 1 tablet (10 mg total) by mouth 2 (two) times daily. 05/10/19  Yes Johnson, Megan P, DO  cyanocobalamin (,VITAMIN B-12,) 1000 MCG/ML injection Inject 1 mL (1,000 mcg total) into the muscle every 30 (thirty) days. 02/08/19  Yes Johnson, Megan P, DO  diclofenac sodium (VOLTAREN) 1 % GEL Apply 4 g topically 4 (four) times daily. 04/22/19  Yes Johnson, Megan P, DO  famotidine (PEPCID) 20 MG tablet Take 1 tablet (20 mg total) by mouth at bedtime. 06/16/19  Yes Johnson, Megan P, DO  fenofibrate micronized (LOFIBRA) 200 MG capsule Take 1 capsule (200 mg total) by mouth daily. 02/05/19  Yes Johnson, Megan P, DO  hydrochlorothiazide (HYDRODIURIL) 25 MG tablet TAKE 1 TABLET(25 MG) BY MOUTH DAILY 03/19/19  Yes Johnson, Megan P, DO  HYDROcodone-acetaminophen (NORCO/VICODIN) 5-325   MG tablet TK 1 T PO Q 6 TO 8 H 02/08/19  Yes [provider]  Insulin Syringe 27G X 1/2" 1 ML MISC 1 each by Does not apply route every 30 (thirty) days. 02/08/19  Yes Johnson, Megan P, DO  isosorbide mononitrate (IMDUR) 30 MG 24 hr tablet Take 1 tablet (30 mg total) by mouth 2 (two) times a day. 02/19/19  Yes Johnson, Megan P, DO  lidocaine (LIDODERM) 5 % Place 2 patches onto the skin daily. Remove & Discard patch within 12 hours or as directed by MD 06/16/19  Yes Johnson, Megan P, DO  lisinopril (ZESTRIL) 40 MG  tablet Take 1 tablet (40 mg total) by mouth 2 (two) times daily. 04/22/19  Yes Johnson, Megan P, DO  loperamide (IMODIUM) 2 MG capsule Take 2 mg by mouth as needed for diarrhea or loose stools.   Yes [provider]  nitroGLYCERIN (NITROSTAT) 0.4 MG SL tablet Place 0.4 mg under the tongue every 5 (five) minutes as needed.  10/20/18  Yes [provider]  nortriptyline (PAMELOR) 50 MG capsule Take 1 capsule (50 mg total) by mouth at bedtime. 06/16/19  Yes Johnson, Megan P, DO  omeprazole (PRILOSEC) 40 MG capsule TK ONE C PO BID 02/05/19  Yes Johnson, Megan P, DO  ondansetron (ZOFRAN) 4 MG tablet TK 1 T PO  Q 6 H PRN 02/05/19  Yes Johnson, Megan P, DO  oxyCODONE-acetaminophen (PERCOCET/ROXICET) 5-325 MG tablet TK 1 T PO Q 6 H PRF PAIN. MAY FILL 02/10/19 02/10/19  Yes [provider]  predniSONE (DELTASONE) 20 MG tablet Take 1 tablet (20 mg total) by mouth daily with breakfast. 07/02/19  Yes Johnson, Megan P, DO    Allergies: No Known Allergies  Social History:  reports that she has been smoking cigarettes. She has a 0.75 pack-year smoking history. She has never used smokeless tobacco. She reports that she does not drink alcohol or use drugs.   Family History: Family History  Adopted: Yes  Problem Relation Age of Onset  . Breast cancer Mother 34    Review of Systems: Review of Systems  Constitutional: Negative for chills and fever.  HENT: Negative for hearing loss.   Eyes: Positive for double vision.  Respiratory: Negative for shortness of breath.   Cardiovascular: Negative for chest pain.  Gastrointestinal: Negative for abdominal pain, nausea and vomiting.  Genitourinary: Negative for dysuria.  Musculoskeletal: Negative for myalgias.  Skin: Negative for rash.  Neurological: Positive for sensory change and headaches.  Psychiatric/Behavioral: Negative for depression.    Physical Exam BP (!) 154/85   Pulse 86   Temp (!) 97.2 F (36.2 C) (Temporal)   Ht 5\' 4"   (1.626 m)   Wt 82 kg   SpO2 94%   BMI 31.03 kg/m  CONSTITUTIONAL: No acute distress HEENT:  Normocephalic, atraumatic, extraocular motion intact. NECK: Trachea is midline, and there is no jugular venous distension.  RESPIRATORY:  Lungs are clear, and breath sounds are equal bilaterally. Normal respiratory effort without pathologic use of accessory muscles. CARDIOVASCULAR: Heart is regular without murmurs, gallops, or rubs. GI: The abdomen is soft, nondistended, nontender.  MUSCULOSKELETAL:  Normal muscle strength and tone in all four extremities.  No peripheral edema or cyanosis. SKIN: There does not appear to be any inflammation at the skin of either temporal site.  The temporal artery is palpable at the hairline on the right side. NEUROLOGIC:  Motor and sensation is grossly normal.  Cranial nerves are grossly intact. PSYCH:  Alert and oriented to person, place and time. Affect is normal.  Laboratory Analysis: No results found for this or any previous visit (from the past 24 hour(s)).  Imaging: No results found.  Assessment and Plan: This is a 73 y.o. female with high suspicion for temporal arteritis.  -Discussed with the patient that we could proceed with a temporal artery biopsy as early as this Monday 12/21.  She is willing to go ahead with this.  Discussed with her that we would rather do this sooner rather than later because otherwise the longer we wait sometimes the results can be more inconclusive because of the steroids already being taking effect.  She understands that she will get tested for Covid today in order to be able to schedule surgery for Monday 12/21.  Discussed with her the procedure at length which would be a temporal artery biopsy on the right side.  Discussed with her that we would make an incision right over the pulse and excise a segment of about 2 to 3 cm in length.  This will be sent out to pathology for testing.  Discussed with her the risks of bleeding,  infection, injury to surrounding structures, and she is willing to proceed.  Face-to-face time spent with the patient and care providers was 60 minutes, with more than 50% of the time spent counseling, educating, and coordinating care of the patient.     Howie Ill, MD [facility]

## 2019-07-09 NOTE — Progress Notes (Signed)
07/09/2019  Reason for Visit:  Temporal arteritis  Referring Provider:  Olevia PerchesMegan Johnson, DO  History of Present Illness: Tara [name] is a 73 y.o. female presenting for evaluation for temporal artery biopsy.  Patient reports that she has had issues with bilateral jaw pain but the right being worse than the left for about 2 months.  She also has been having issues with intermittent posterior headaches on both sides are of short duration as well as issues with visual disturbances and double vision on the right eye particularly.  She also reports that intermittently she will have numbness going from the middle portion of her face towards the left cheek that is intermittent in nature.  She denies any nausea, vomiting, vertigo.  She was seen by her PCP on 07/02/2019 and was started on prednisone with high suspicion for temporal arteritis.  She was referred to us for biopsy for pathologic confirmation.   Past Medical History: Past Medical History:  Diagnosis Date  . Anxiety   . Barrett's syndrome   . Depression   . Heart murmur   . Hyperlipidemia   . Hypertension   . Myocardial infarction [facility](HCC)    08     Past Surgical History: Past Surgical History:  Procedure Laterality Date  . ABDOMINAL HYSTERECTOMY    . BREAST BIOPSY Left 2007   Neg- [place]  . CARDIAC CATHETERIZATION    . CARDIAC VALVE REPLACEMENT    . COLON SURGERY    . COLONOSCOPY WITH PROPOFOL N/A 04/09/2019   Procedure: COLONOSCOPY WITH PROPOFOL;  Surgeon: Pasty Spillersahiliani, Varnita B, MD;  Location: [facility] ENDOSCOPY;  Service: Endoscopy;  Laterality: N/A;  . CORONARY ANGIOPLASTY WITH STENT PLACEMENT  2008   [place] 1   . ESOPHAGOGASTRODUODENOSCOPY (EGD) WITH PROPOFOL N/A 04/09/2019   Procedure: ESOPHAGOGASTRODUODENOSCOPY (EGD) WITH PROPOFOL;  Surgeon: Pasty Spillersahiliani, Varnita B, MD;  Location: [facility] ENDOSCOPY;  Service: Endoscopy;  Laterality: N/A;  . EXCISIONAL HEMORRHOIDECTOMY    . EYE SURGERY     . HERNIA REPAIR      Home Medications: Prior to Admission medications   Medication Sig Start Date End Date Taking? Authorizing Provider  ALPRAZolam Prudy Feeler(XANAX) 0.5 MG tablet Take 1 tablet (0.5 mg total) by mouth daily as needed for anxiety. 04/22/19  Yes Johnson, Megan P, DO  amoxicillin-clavulanate (AUGMENTIN) 875-125 MG tablet Take 1 tablet by mouth 2 (two) times daily. 07/02/19  Yes Johnson, Megan P, DO  aspirin EC 325 MG tablet Take 1 tablet by mouth daily.   Yes [provider]  atorvastatin (LIPITOR) 40 MG tablet Take 40 mg by mouth daily.   Yes [provider]  citalopram (CELEXA) 10 MG tablet Take 1 tablet (10 mg total) by mouth 2 (two) times daily. 05/10/19  Yes Johnson, Megan P, DO  cyanocobalamin (,VITAMIN B-12,) 1000 MCG/ML injection Inject 1 mL (1,000 mcg total) into the muscle every 30 (thirty) days. 02/08/19  Yes Johnson, Megan P, DO  diclofenac sodium (VOLTAREN) 1 % GEL Apply 4 g topically 4 (four) times daily. 04/22/19  Yes Johnson, Megan P, DO  famotidine (PEPCID) 20 MG tablet Take 1 tablet (20 mg total) by mouth at bedtime. 06/16/19  Yes Johnson, Megan P, DO  fenofibrate micronized (LOFIBRA) 200 MG capsule Take 1 capsule (200 mg total) by mouth daily. 02/05/19  Yes Johnson, Megan P, DO  hydrochlorothiazide (HYDRODIURIL) 25 MG tablet TAKE 1 TABLET(25 MG) BY MOUTH DAILY 03/19/19  Yes Johnson, Megan P, DO  HYDROcodone-acetaminophen (NORCO/VICODIN) [phone]  MG tablet TK 1 T PO Q 6 TO 8 H 02/08/19  Yes [provider]  Insulin Syringe 27G X 1/2" 1 ML MISC 1 each by Does not apply route every 30 (thirty) days. 02/08/19  Yes Johnson, Megan P, DO  isosorbide mononitrate (IMDUR) 30 MG 24 hr tablet Take 1 tablet (30 mg total) by mouth 2 (two) times a day. 02/19/19  Yes Johnson, Megan P, DO  lidocaine (LIDODERM) 5 % Place 2 patches onto the skin daily. Remove & Discard patch within 12 hours or as directed by MD 06/16/19  Yes Johnson, Megan P, DO  lisinopril (ZESTRIL) 40 MG  tablet Take 1 tablet (40 mg total) by mouth 2 (two) times daily. 04/22/19  Yes Johnson, Megan P, DO  loperamide (IMODIUM) 2 MG capsule Take 2 mg by mouth as needed for diarrhea or loose stools.   Yes [provider]  nitroGLYCERIN (NITROSTAT) 0.4 MG SL tablet Place 0.4 mg under the tongue every 5 (five) minutes as needed.  10/20/18  Yes [provider]  nortriptyline (PAMELOR) 50 MG capsule Take 1 capsule (50 mg total) by mouth at bedtime. 06/16/19  Yes Johnson, Megan P, DO  omeprazole (PRILOSEC) 40 MG capsule TK ONE C PO BID 02/05/19  Yes Johnson, Megan P, DO  ondansetron (ZOFRAN) 4 MG tablet TK 1 T PO  Q 6 H PRN 02/05/19  Yes Johnson, Megan P, DO  oxyCODONE-acetaminophen (PERCOCET/ROXICET) 5-325 MG tablet TK 1 T PO Q 6 H PRF PAIN. MAY FILL 02/10/19 02/10/19  Yes [provider]  predniSONE (DELTASONE) 20 MG tablet Take 1 tablet (20 mg total) by mouth daily with breakfast. 07/02/19  Yes Johnson, Megan P, DO    Allergies: No Known Allergies  Social History:  reports that she has been smoking cigarettes. She has a 0.75 pack-year smoking history. She has never used smokeless tobacco. She reports that she does not drink alcohol or use drugs.   Family History: Family History  Adopted: Yes  Problem Relation Age of Onset  . Breast cancer Mother 34    Review of Systems: Review of Systems  Constitutional: Negative for chills and fever.  HENT: Negative for hearing loss.   Eyes: Positive for double vision.  Respiratory: Negative for shortness of breath.   Cardiovascular: Negative for chest pain.  Gastrointestinal: Negative for abdominal pain, nausea and vomiting.  Genitourinary: Negative for dysuria.  Musculoskeletal: Negative for myalgias.  Skin: Negative for rash.  Neurological: Positive for sensory change and headaches.  Psychiatric/Behavioral: Negative for depression.    Physical Exam BP (!) 154/85   Pulse 86   Temp (!) 97.2 F (36.2 C) (Temporal)   Ht 5\' 4"   (1.626 m)   Wt 82 kg   SpO2 94%   BMI 31.03 kg/m  CONSTITUTIONAL: No acute distress HEENT:  Normocephalic, atraumatic, extraocular motion intact. NECK: Trachea is midline, and there is no jugular venous distension.  RESPIRATORY:  Lungs are clear, and breath sounds are equal bilaterally. Normal respiratory effort without pathologic use of accessory muscles. CARDIOVASCULAR: Heart is regular without murmurs, gallops, or rubs. GI: The abdomen is soft, nondistended, nontender.  MUSCULOSKELETAL:  Normal muscle strength and tone in all four extremities.  No peripheral edema or cyanosis. SKIN: There does not appear to be any inflammation at the skin of either temporal site.  The temporal artery is palpable at the hairline on the right side. NEUROLOGIC:  Motor and sensation is grossly normal.  Cranial nerves are grossly intact. PSYCH:  Alert and oriented to person, place and time. Affect is normal.  Laboratory Analysis: No results found for this or any previous visit (from the past 24 hour(s)).  Imaging: No results found.  Assessment and Plan: This is a 73 y.o. female with high suspicion for temporal arteritis.  -Discussed with the patient that we could proceed with a temporal artery biopsy as early as this Monday 12/21.  She is willing to go ahead with this.  Discussed with her that we would rather do this sooner rather than later because otherwise the longer we wait sometimes the results can be more inconclusive because of the steroids already being taking effect.  She understands that she will get tested for Covid today in order to be able to schedule surgery for Monday 12/21.  Discussed with her the procedure at length which would be a temporal artery biopsy on the right side.  Discussed with her that we would make an incision right over the pulse and excise a segment of about 2 to 3 cm in length.  This will be sent out to pathology for testing.  Discussed with her the risks of bleeding,  infection, injury to surrounding structures, and she is willing to proceed.  Face-to-face time spent with the patient and care providers was 60 minutes, with more than 50% of the time spent counseling, educating, and coordinating care of the patient.     Howie Ill, MD [facility]

## 2019-07-10 LAB — SARS CORONAVIRUS 2 (TAT 6-24 HRS): SARS Coronavirus 2: NEGATIVE

## 2019-07-11 MED ORDER — CEFAZOLIN SODIUM-DEXTROSE 2-4 GM/100ML-% IV SOLN
2.0000 g | INTRAVENOUS | Status: AC
  Administered 2019-07-12: 2 g via INTRAVENOUS

## 2019-07-12 ENCOUNTER — Ambulatory Visit: Payer: Federal, State, Local not specified - PPO | Admitting: Anesthesiology

## 2019-07-12 ENCOUNTER — Telehealth: Payer: Self-pay

## 2019-07-12 ENCOUNTER — Other Ambulatory Visit: Payer: Self-pay

## 2019-07-12 ENCOUNTER — Ambulatory Visit
Admission: RE | Admit: 2019-07-12 | Discharge: 2019-07-12 | Disposition: A | Discharge status: Home / Self Care | Payer: Federal, State, Local not specified - PPO | Attending: Surgery | Admitting: Surgery

## 2019-07-12 ENCOUNTER — Ambulatory Visit: Payer: Federal, State, Local not specified - PPO | Admitting: Family

## 2019-07-12 ENCOUNTER — Telehealth: Payer: Self-pay | Admitting: Emergency Medicine

## 2019-07-12 ENCOUNTER — Encounter: Payer: Self-pay | Admitting: Surgery

## 2019-07-12 ENCOUNTER — Encounter
Admission: RE | Disposition: A | Discharge status: Home / Self Care | Payer: Self-pay | Source: Home / Self Care | Attending: Surgery

## 2019-07-12 DIAGNOSIS — Z791 Long term (current) use of non-steroidal anti-inflammatories (NSAID): Secondary | ICD-10-CM | POA: Insufficient documentation

## 2019-07-12 DIAGNOSIS — K219 Gastro-esophageal reflux disease without esophagitis: Secondary | ICD-10-CM | POA: Insufficient documentation

## 2019-07-12 DIAGNOSIS — Z955 Presence of coronary angioplasty implant and graft: Secondary | ICD-10-CM | POA: Insufficient documentation

## 2019-07-12 DIAGNOSIS — R200 Anesthesia of skin: Secondary | ICD-10-CM | POA: Insufficient documentation

## 2019-07-12 DIAGNOSIS — M316 Other giant cell arteritis: Secondary | ICD-10-CM | POA: Diagnosis not present

## 2019-07-12 DIAGNOSIS — Z7952 Long term (current) use of systemic steroids: Secondary | ICD-10-CM | POA: Insufficient documentation

## 2019-07-12 DIAGNOSIS — H532 Diplopia: Secondary | ICD-10-CM | POA: Insufficient documentation

## 2019-07-12 DIAGNOSIS — R6884 Jaw pain: Secondary | ICD-10-CM | POA: Diagnosis not present

## 2019-07-12 DIAGNOSIS — I10 Essential (primary) hypertension: Secondary | ICD-10-CM | POA: Diagnosis not present

## 2019-07-12 DIAGNOSIS — Z952 Presence of prosthetic heart valve: Secondary | ICD-10-CM | POA: Insufficient documentation

## 2019-07-12 DIAGNOSIS — F329 Major depressive disorder, single episode, unspecified: Secondary | ICD-10-CM | POA: Insufficient documentation

## 2019-07-12 DIAGNOSIS — E785 Hyperlipidemia, unspecified: Secondary | ICD-10-CM | POA: Insufficient documentation

## 2019-07-12 DIAGNOSIS — Z7982 Long term (current) use of aspirin: Secondary | ICD-10-CM | POA: Insufficient documentation

## 2019-07-12 DIAGNOSIS — R519 Headache, unspecified: Secondary | ICD-10-CM | POA: Insufficient documentation

## 2019-07-12 DIAGNOSIS — F17210 Nicotine dependence, cigarettes, uncomplicated: Secondary | ICD-10-CM | POA: Insufficient documentation

## 2019-07-12 DIAGNOSIS — Z79899 Other long term (current) drug therapy: Secondary | ICD-10-CM | POA: Insufficient documentation

## 2019-07-12 DIAGNOSIS — F419 Anxiety disorder, unspecified: Secondary | ICD-10-CM | POA: Insufficient documentation

## 2019-07-12 DIAGNOSIS — I252 Old myocardial infarction: Secondary | ICD-10-CM | POA: Insufficient documentation

## 2019-07-12 HISTORY — PX: ARTERY BIOPSY: SHX891

## 2019-07-12 SURGERY — BIOPSY TEMPORAL ARTERY
Anesthesia: General | Laterality: Right

## 2019-07-12 MED ORDER — GABAPENTIN 300 MG PO CAPS: 300.0000 mg | ORAL_CAPSULE | ORAL | Status: AC

## 2019-07-12 MED ORDER — GLYCOPYRROLATE 0.2 MG/ML IJ SOLN
INTRAMUSCULAR | Status: DC | PRN
  Administered 2019-07-12: .2 mg via INTRAVENOUS

## 2019-07-12 MED ORDER — GABAPENTIN 300 MG PO CAPS
ORAL_CAPSULE | ORAL | Status: AC
  Administered 2019-07-12: 300 mg via ORAL
  Filled 2019-07-12: qty 1

## 2019-07-12 MED ORDER — OXYCODONE-ACETAMINOPHEN 5-325 MG PO TABS
ORAL_TABLET | ORAL | Status: AC
  Filled 2019-07-12: qty 1

## 2019-07-12 MED ORDER — ONDANSETRON HCL 4 MG/2ML IJ SOLN
INTRAMUSCULAR | Status: AC
  Filled 2019-07-12: qty 2

## 2019-07-12 MED ORDER — ACETAMINOPHEN 500 MG PO TABS: 1000.0000 mg | ORAL_TABLET | ORAL | Status: AC

## 2019-07-12 MED ORDER — LIDOCAINE HCL 1 % IJ SOLN
INTRAMUSCULAR | Status: DC | PRN
  Administered 2019-07-12: 7 mL

## 2019-07-12 MED ORDER — LIDOCAINE HCL (PF) 2 % IJ SOLN
INTRAMUSCULAR | Status: AC
  Filled 2019-07-12: qty 10

## 2019-07-12 MED ORDER — FENTANYL CITRATE (PF) 100 MCG/2ML IJ SOLN
25.0000 ug | INTRAMUSCULAR | Status: DC | PRN
  Administered 2019-07-12: 50 ug via INTRAVENOUS
  Administered 2019-07-12: 25 ug via INTRAVENOUS

## 2019-07-12 MED ORDER — FAMOTIDINE 20 MG PO TABS: 20.0000 mg | ORAL_TABLET | Freq: Once | ORAL | Status: AC

## 2019-07-12 MED ORDER — FENTANYL CITRATE (PF) 100 MCG/2ML IJ SOLN
INTRAMUSCULAR | Status: AC
  Filled 2019-07-12: qty 2

## 2019-07-12 MED ORDER — CHLORHEXIDINE GLUCONATE CLOTH 2 % EX PADS: 6.0000 | MEDICATED_PAD | Freq: Once | CUTANEOUS | Status: DC

## 2019-07-12 MED ORDER — CEFAZOLIN SODIUM-DEXTROSE 2-4 GM/100ML-% IV SOLN
INTRAVENOUS | Status: AC
  Filled 2019-07-12: qty 100

## 2019-07-12 MED ORDER — PROPOFOL 10 MG/ML IV BOLUS
INTRAVENOUS | Status: AC
  Filled 2019-07-12: qty 20

## 2019-07-12 MED ORDER — LIDOCAINE HCL (PF) 1 % IJ SOLN
INTRAMUSCULAR | Status: AC
  Filled 2019-07-12: qty 30

## 2019-07-12 MED ORDER — MIDAZOLAM HCL 2 MG/2ML IJ SOLN
INTRAMUSCULAR | Status: AC
  Filled 2019-07-12: qty 2

## 2019-07-12 MED ORDER — PHENYLEPHRINE HCL (PRESSORS) 10 MG/ML IV SOLN
INTRAVENOUS | Status: DC | PRN
  Administered 2019-07-12: 200 ug via INTRAVENOUS
  Administered 2019-07-12: 100 ug via INTRAVENOUS
  Administered 2019-07-12 (×3): 200 ug via INTRAVENOUS
  Administered 2019-07-12: 100 ug via INTRAVENOUS
  Administered 2019-07-12: 200 ug via INTRAVENOUS

## 2019-07-12 MED ORDER — FENTANYL CITRATE (PF) 100 MCG/2ML IJ SOLN
INTRAMUSCULAR | Status: AC
  Administered 2019-07-12: 25 ug via INTRAVENOUS
  Filled 2019-07-12: qty 2

## 2019-07-12 MED ORDER — LIDOCAINE HCL (PF) 2 % IJ SOLN
INTRAMUSCULAR | Status: DC | PRN
  Administered 2019-07-12: 40 mg

## 2019-07-12 MED ORDER — GLYCOPYRROLATE 0.2 MG/ML IJ SOLN
INTRAMUSCULAR | Status: AC
  Filled 2019-07-12: qty 1

## 2019-07-12 MED ORDER — HYDROCODONE-ACETAMINOPHEN 5-325 MG PO TABS: 1.0000 | ORAL_TABLET | ORAL | 0 refills | Status: DC | PRN

## 2019-07-12 MED ORDER — ACETAMINOPHEN 500 MG PO TABS
ORAL_TABLET | ORAL | Status: AC
  Administered 2019-07-12: 1000 mg via ORAL
  Filled 2019-07-12: qty 2

## 2019-07-12 MED ORDER — MIDAZOLAM HCL 2 MG/2ML IJ SOLN
INTRAMUSCULAR | Status: DC | PRN
  Administered 2019-07-12 (×2): 1 mg via INTRAVENOUS

## 2019-07-12 MED ORDER — ONDANSETRON HCL 4 MG/2ML IJ SOLN
INTRAMUSCULAR | Status: DC | PRN
  Administered 2019-07-12: 4 mg via INTRAVENOUS

## 2019-07-12 MED ORDER — FENTANYL CITRATE (PF) 100 MCG/2ML IJ SOLN
INTRAMUSCULAR | Status: DC | PRN
  Administered 2019-07-12 (×2): 25 ug via INTRAVENOUS

## 2019-07-12 MED ORDER — OXYCODONE-ACETAMINOPHEN 5-325 MG PO TABS
1.0000 | ORAL_TABLET | Freq: Once | ORAL | Status: AC
  Administered 2019-07-12: 1 via ORAL

## 2019-07-12 MED ORDER — ONDANSETRON HCL 4 MG/2ML IJ SOLN
4.0000 mg | Freq: Once | INTRAMUSCULAR | Status: AC | PRN
  Administered 2019-07-12: 14:00:00 4 mg via INTRAVENOUS

## 2019-07-12 MED ORDER — DEXAMETHASONE SODIUM PHOSPHATE 10 MG/ML IJ SOLN
INTRAMUSCULAR | Status: DC | PRN
  Administered 2019-07-12: 5 mg via INTRAVENOUS

## 2019-07-12 MED ORDER — FAMOTIDINE 20 MG PO TABS
ORAL_TABLET | ORAL | Status: AC
  Administered 2019-07-12: 11:00:00 20 mg via ORAL
  Filled 2019-07-12: qty 1

## 2019-07-12 MED ORDER — EPHEDRINE SULFATE 50 MG/ML IJ SOLN
INTRAMUSCULAR | Status: DC | PRN
  Administered 2019-07-12: 5 mg via INTRAVENOUS
  Administered 2019-07-12: 20 mg via INTRAVENOUS
  Administered 2019-07-12 (×2): 10 mg via INTRAVENOUS

## 2019-07-12 MED ORDER — LACTATED RINGERS IV SOLN: INTRAVENOUS | Status: DC

## 2019-07-12 MED ORDER — PROPOFOL 10 MG/ML IV BOLUS
INTRAVENOUS | Status: DC | PRN
  Administered 2019-07-12: 100 mg via INTRAVENOUS

## 2019-07-12 SURGICAL SUPPLY — 41 items
BLADE SURG 15 STRL LF DISP TIS (BLADE) ×1 IMPLANT
BLADE SURG 15 STRL SS (BLADE) ×2
BLADE SURG SZ11 CARB STEEL (BLADE) ×3 IMPLANT
CNTNR SPEC 2.5X3XGRAD LEK (MISCELLANEOUS)
CONT SPEC 4OZ STER OR WHT (MISCELLANEOUS)
CONTAINER SPEC 2.5X3XGRAD LEK (MISCELLANEOUS) IMPLANT
COTTON BALL STRL MEDIUM (GAUZE/BANDAGES/DRESSINGS) ×3 IMPLANT
COVER WAND RF STERILE (DRAPES) IMPLANT
DERMABOND ADVANCED (GAUZE/BANDAGES/DRESSINGS) ×2
DERMABOND ADVANCED .7 DNX12 (GAUZE/BANDAGES/DRESSINGS) ×1 IMPLANT
DRAPE LAPAROTOMY 77X122 PED (DRAPES) ×3 IMPLANT
DRSG TELFA 4X3 1S NADH ST (GAUZE/BANDAGES/DRESSINGS) ×3 IMPLANT
ELECT CAUTERY BLADE TIP 2.5 (TIP) ×3
ELECT REM PT RETURN 9FT ADLT (ELECTROSURGICAL) ×3
ELECTRODE CAUTERY BLDE TIP 2.5 (TIP) ×1 IMPLANT
ELECTRODE REM PT RTRN 9FT ADLT (ELECTROSURGICAL) ×1 IMPLANT
GLOVE BIOGEL PI IND STRL 7.0 (GLOVE) ×1 IMPLANT
GLOVE BIOGEL PI INDICATOR 7.0 (GLOVE) ×2
GLOVE SURG SYN 6.5 ES PF (GLOVE) ×3 IMPLANT
GOWN STRL REUS W/ TWL LRG LVL3 (GOWN DISPOSABLE) ×2 IMPLANT
GOWN STRL REUS W/TWL LRG LVL3 (GOWN DISPOSABLE) ×4
KIT TURNOVER KIT A (KITS) ×3 IMPLANT
LABEL OR SOLS (LABEL) ×3 IMPLANT
NEEDLE HYPO 25X1 1.5 SAFETY (NEEDLE) IMPLANT
NS IRRIG 500ML POUR BTL (IV SOLUTION) ×3 IMPLANT
PACK BASIN MINOR ARMC (MISCELLANEOUS) ×3 IMPLANT
SOL PREP PVP 2OZ (MISCELLANEOUS) ×3
SOLUTION PREP PVP 2OZ (MISCELLANEOUS) ×1 IMPLANT
SUCTION FRAZIER HANDLE 10FR (MISCELLANEOUS) ×2
SUCTION TUBE FRAZIER 10FR DISP (MISCELLANEOUS) ×1 IMPLANT
SUT MNCRL AB 4-0 PS2 18 (SUTURE) ×3 IMPLANT
SUT SILK 2 0 (SUTURE)
SUT SILK 2-0 18XBRD TIE 12 (SUTURE) IMPLANT
SUT SILK 3 0 (SUTURE) ×2
SUT SILK 3-0 18XBRD TIE 12 (SUTURE) ×1 IMPLANT
SUT SILK 4 0 (SUTURE)
SUT SILK 4-0 18XBRD TIE 12 (SUTURE) IMPLANT
SUT VIC AB 3-0 SH 27 (SUTURE)
SUT VIC AB 3-0 SH 27X BRD (SUTURE) IMPLANT
SYR 10ML LL (SYRINGE) IMPLANT
SYR BULB IRRIG 60ML STRL (SYRINGE) ×3 IMPLANT

## 2019-07-12 NOTE — Transfer of Care (Signed)
Immediate Anesthesia Transfer of Care Note  Patient: Tara [name]  Procedure(s) Performed: BIOPSY TEMPORAL ARTERY (Right )  Patient Location: PACU  Anesthesia Type:General  Level of Consciousness: sedated  Airway & Oxygen Therapy: Patient Spontanous Breathing  Post-op Assessment: Report given to RN and Post -op Vital signs reviewed and stable  Post vital signs: Reviewed and stable  Last Vitals:  Vitals Value Taken Time  BP 120/53 07/12/19 1254  Temp    Pulse 91 07/12/19 1255  Resp 17 07/12/19 1255  SpO2 94 % 07/12/19 1255  Vitals shown include unvalidated device data.  Last Pain:  Vitals:   07/12/19 1049  TempSrc: Temporal  PainSc: 8          Complications: No apparent anesthesia complications

## 2019-07-12 NOTE — Telephone Encounter (Signed)
Caitlyn from [facility] called stating patient is a Chronic Pain patient and is on Percocet.  She called wanting to verify if she should refill the [facility] prescription Dr. Hampton Abbot sent in today 07/12/2019. Per Dr. Hampton Abbot, Caitlyn made aware to go ahead and fill Norco prescription.

## 2019-07-12 NOTE — Op Note (Signed)
  Procedure Date:  07/12/2019  Pre-operative Diagnosis:  Temporal arteritis  Post-operative Diagnosis:  Temporal arteritis  Procedure:  Right temporal artery biopsy  Surgeon:  Melvyn Neth, MD  Anesthesia:  General endotracheal  Estimated Blood Loss:  5 ml  Specimens:  Right temporal artery  Complications:  None  Indications for Procedure:  This is a 73 y.o. female with diagnosis of temporal arteritis, with symptoms worse on the right side.  Biopsy has been recommended for diagnosis.  The risks of bleeding, abscess or infection, injury to surrounding structures, and need for further procedures were all discussed with the patient and was willing to proceed.  Description of Procedure: The patient was correctly identified in the preoperative area and brought into the operating room.  The patient was placed supine with VTE prophylaxis in place.  Appropriate time-outs were performed.  Anesthesia was induced and the patient was intubated.  Appropriate antibiotics were infused.  The patient's right temporal artery was palpated and using ultrasound, its course was marked on the patient.  The patient's right temporal area was prepped and draped in usual sterile fashion.  Local anesthetic was infused intradermally.  A 3 cm incision was made over the artery's course.  Cautery was used to dissect down the subcutaneous tissue, exposing the vessel.  Careful sharp dissection was then used to free up a 2 cm segment.  One lateral branch was ligated using 3-0 Silk.  The artery was then clamped proximally and distally and cut.  The specimen measured 2 cm in length.  The ends were then ligated using 3-0 Silk.  The wound was irrigated and then closed in two layers using 3-0 Vicryl and 4-0 Monocryl.  The wound was cleaned and sealed with DermaBond.  The patient was then emerged from anesthesia, extubated, and brought to the recovery room for further management.  The patient tolerated the procedure well and  all counts were correct at the end of the case.   Melvyn Neth, MD

## 2019-07-12 NOTE — Anesthesia Post-op Follow-up Note (Signed)
Anesthesia QCDR form completed.        

## 2019-07-12 NOTE — Anesthesia Procedure Notes (Signed)
Procedure Name: LMA Insertion Date/Time: 07/12/2019 11:48 AM Performed by: Lia Foyer, CRNA Patient Re-evaluated:Patient Re-evaluated prior to induction Oxygen Delivery Method: Circle system utilized Preoxygenation: Pre-oxygenation with 100% oxygen Induction Type: IV induction Ventilation: Mask ventilation without difficulty LMA: LMA inserted LMA Size: 4.0 Number of attempts: 1

## 2019-07-12 NOTE — Discharge Instructions (Signed)

## 2019-07-12 NOTE — Interval H&P Note (Signed)
History and Physical Interval Note:  07/12/2019 10:33 AM  Tara [name]  has presented today for surgery, with the diagnosis of Temporal arteritis.  The various methods of treatment have been discussed with the patient and family. After consideration of risks, benefits and other options for treatment, the patient has consented to  Procedure(s): BIOPSY TEMPORAL ARTERY (Right) as a surgical intervention.  The patient's history has been reviewed, patient examined, no change in status, stable for surgery.  I have reviewed the patient's chart and labs.  Questions were answered to the patient's satisfaction.     Michael Ventresca

## 2019-07-12 NOTE — Telephone Encounter (Signed)
Pre surgery instructions reviewed with patient. Aware of what meds she may take this morning. Aware of arrival time and location.

## 2019-07-12 NOTE — OR Nursing (Signed)
Called and spoke to pt. Since she was 15 minutes late. She advised "Arbie Cookey" called her this morning and told her to not be here until 10:15 instead of 9:15. She also advised she wouldn't be here before 10:15.

## 2019-07-12 NOTE — Anesthesia Preprocedure Evaluation (Signed)
Anesthesia Evaluation  Patient identified by MRN, date of birth, ID band Patient awake    Reviewed: Allergy & Precautions, NPO status , Patient's Chart, lab work & pertinent test results, reviewed documented beta blocker date and time   History of Anesthesia Complications Negative for: history of anesthetic complications  Airway Mallampati: II  TM Distance: >3 FB     Dental  (+) Dental Advidsory Given, Edentulous Upper, Edentulous Lower, Implants   Pulmonary neg shortness of breath, neg COPD, neg recent URI, Current Smoker,           Cardiovascular hypertension, Pt. on medications (-) angina+ CAD, + Past MI and + Cardiac Stents  (-) CABG (-) dysrhythmias + Valvular Problems/Murmurs (s/p AVR)      Neuro/Psych neg Seizures PSYCHIATRIC DISORDERS Anxiety Depression  Neuromuscular disease    GI/Hepatic Neg liver ROS, GERD  ,  Endo/Other  negative endocrine ROS  Renal/GU negative Renal ROS     Musculoskeletal  (+) Arthritis ,   Abdominal   Peds  Hematology   Anesthesia Other Findings Past Medical History: No date: Anxiety No date: Barrett's syndrome No date: Depression No date: Heart murmur No date: Hyperlipidemia No date: Hypertension No date: Myocardial infarction [facility])     Comment:  08   Reproductive/Obstetrics                             Anesthesia Physical  Anesthesia Plan  ASA: III  Anesthesia Plan: General   Post-op Pain Management:    Induction: Intravenous  PONV Risk Score and Plan: 2 and Ondansetron, Dexamethasone and Treatment may vary due to age or medical condition  Airway Management Planned: LMA  Additional Equipment:   Intra-op Plan:   Post-operative Plan: Extubation in OR  Informed Consent: I have reviewed the patients History and Physical, chart, labs and discussed the procedure including the risks, benefits and alternatives for the proposed anesthesia with  the patient or authorized representative who has indicated his/her understanding and acceptance.       Plan Discussed with: CRNA  Anesthesia Plan Comments:         Anesthesia Quick Evaluation

## 2019-07-13 ENCOUNTER — Ambulatory Visit: Payer: Federal, State, Local not specified - PPO | Admitting: Family

## 2019-07-13 ENCOUNTER — Encounter: Payer: Self-pay | Admitting: Family

## 2019-07-13 ENCOUNTER — Encounter: Payer: Self-pay | Admitting: Family Medicine

## 2019-07-13 ENCOUNTER — Ambulatory Visit (INDEPENDENT_AMBULATORY_CARE_PROVIDER_SITE_OTHER): Payer: Federal, State, Local not specified - PPO

## 2019-07-13 ENCOUNTER — Other Ambulatory Visit: Payer: Self-pay | Admitting: Family Medicine

## 2019-07-13 ENCOUNTER — Ambulatory Visit (INDEPENDENT_AMBULATORY_CARE_PROVIDER_SITE_OTHER): Payer: Federal, State, Local not specified - PPO | Admitting: Family Medicine

## 2019-07-13 ENCOUNTER — Telehealth: Payer: Self-pay | Admitting: Family

## 2019-07-13 ENCOUNTER — Other Ambulatory Visit
Admission: RE | Admit: 2019-07-13 | Discharge: 2019-07-13 | Disposition: A | Discharge status: Home / Self Care | Payer: Federal, State, Local not specified - PPO | Source: Ambulatory Visit | Attending: Family | Admitting: Family

## 2019-07-13 VITALS — BP 130/65

## 2019-07-13 VITALS — BP 164/80 | HR 74 | Ht 64.0 in | Wt 172.0 lb

## 2019-07-13 DIAGNOSIS — I739 Peripheral vascular disease, unspecified: Secondary | ICD-10-CM

## 2019-07-13 DIAGNOSIS — E782 Mixed hyperlipidemia: Secondary | ICD-10-CM | POA: Insufficient documentation

## 2019-07-13 DIAGNOSIS — M5134 Other intervertebral disc degeneration, thoracic region: Secondary | ICD-10-CM | POA: Diagnosis not present

## 2019-07-13 DIAGNOSIS — I359 Nonrheumatic aortic valve disorder, unspecified: Secondary | ICD-10-CM

## 2019-07-13 DIAGNOSIS — Z720 Tobacco use: Secondary | ICD-10-CM

## 2019-07-13 DIAGNOSIS — R519 Headache, unspecified: Secondary | ICD-10-CM | POA: Diagnosis not present

## 2019-07-13 DIAGNOSIS — I25118 Atherosclerotic heart disease of native coronary artery with other forms of angina pectoris: Secondary | ICD-10-CM

## 2019-07-13 DIAGNOSIS — I10 Essential (primary) hypertension: Secondary | ICD-10-CM

## 2019-07-13 LAB — LIPID PANEL
Cholesterol: 238 mg/dL — ABNORMAL HIGH (ref 0–200)
HDL: 43 mg/dL (ref >40)
LDL Cholesterol: 147 mg/dL — ABNORMAL HIGH (ref 0–99)
Total CHOL/HDL Ratio: 5.5 RATIO
Triglycerides: 239 mg/dL — ABNORMAL HIGH (ref <150)
VLDL: 48 mg/dL — ABNORMAL HIGH (ref 0–40)

## 2019-07-13 MED ORDER — ISOSORBIDE MONONITRATE ER 30 MG PO TB24: ORAL_TABLET | ORAL | 1 refills | Status: DC

## 2019-07-13 MED ORDER — PREDNISONE 20 MG PO TABS: 20.0000 mg | ORAL_TABLET | Freq: Every day | ORAL | 0 refills | Status: DC

## 2019-07-13 MED ORDER — PREGABALIN 50 MG PO CAPS: ORAL_CAPSULE | ORAL | 2 refills | Status: DC

## 2019-07-13 NOTE — Telephone Encounter (Signed)
Pt seen in office visit 07/13/19. She had ABI just prior to her office visit. Dr. Fletcher Anon has reviewed ABI and recommended abdominal aortogram with lower extremity angiography and possible angioplasty. Per Dr. Fletcher Anon this can be scheduled for 07/28/2019.   Called Tara [name] 07/13/19 after her office visit - she is aware of this recommendation and agreeable to proceed. Will route to [facility] Triage for assistance in scheduling.   Loel Dubonnet, NP

## 2019-07-13 NOTE — Patient Instructions (Signed)
Medication Instructions:   Your physician has recommended you make the following change in your medication:   1) Increase your Isosorbide to 60MG , 2 tablets by mouth in the morning, and 30MG , 1 tablet by mouth at night  *If you need a refill on your cardiac medications before your next appointment, please call your pharmacy*  Lab Work:  You will have labs drawn today: Lipid and Direct LDL  If you have labs (blood work) drawn today and your tests are completely normal, you will receive your results only by: Marland Kitchen MyChart Message (if you have MyChart) OR . A paper copy in the mail If you have any lab test that is abnormal or we need to change your treatment, we will call you to review the results.  Testing/Procedures:  You had an EKG today  Follow-Up:  Follow up with Dr. Fletcher Anon in 4 months.   Other Instructions  Dr. Fletcher Anon will follow up with you about your leg ultrasound that you had today.

## 2019-07-13 NOTE — Assessment & Plan Note (Signed)
Cannot afford MRI at this time. Will start lyrica to see how she does and try to treat symptoms until she is able to get her MRI. Call with any concerns. Continue to monitor.

## 2019-07-13 NOTE — H&P (View-Only) (Signed)
Office Visit    Patient Name: Tara [name] Date of Encounter: 07/13/2019  Primary Care Provider:  Dorcas CarrowJohnson, Megan P, DO Primary Cardiologist:  Lorine BearsMuhammad Arida, MD Electrophysiologist:  None   Chief Complaint    Tara Balihyllis Louise Kobler is a 73 y.o. female with a hx of CAD s/p stent with unknown details in [place] 2008, AVR with tissue valve 2016 in [place] for aortic insufficiency, tobacco use, HTN, HLD, PAD presents today for left shoulder pain.  Past Medical History    Past Medical History:  Diagnosis Date  . Anxiety   . Barrett's syndrome   . Depression   . Heart murmur   . Hyperlipidemia   . Hypertension   . Myocardial infarction [facility])    08   Past Surgical History:  Procedure Laterality Date  . ABDOMINAL HYSTERECTOMY    . ARTERY BIOPSY Right 07/12/2019   Procedure: BIOPSY TEMPORAL ARTERY;  Surgeon: Henrene DodgePiscoya, Jose, MD;  Location: [facility] ORS;  Service: General;  Laterality: Right;  . BREAST BIOPSY Left 2007   Neg- [place]  . CARDIAC CATHETERIZATION    . CARDIAC VALVE REPLACEMENT    . COLON SURGERY    . COLONOSCOPY WITH PROPOFOL N/A 04/09/2019   Procedure: COLONOSCOPY WITH PROPOFOL;  Surgeon: Pasty Spillersahiliani, Varnita B, MD;  Location: [facility] ENDOSCOPY;  Service: Endoscopy;  Laterality: N/A;  . CORONARY ANGIOPLASTY WITH STENT PLACEMENT  2008   [place] Stent [facility] x 1   . ESOPHAGOGASTRODUODENOSCOPY (EGD) WITH PROPOFOL N/A 04/09/2019   Procedure: ESOPHAGOGASTRODUODENOSCOPY (EGD) WITH PROPOFOL;  Surgeon: Pasty Spillersahiliani, Varnita B, MD;  Location: [facility] ENDOSCOPY;  Service: Endoscopy;  Laterality: N/A;  . EXCISIONAL HEMORRHOIDECTOMY    . EYE SURGERY    . HERNIA REPAIR      Allergies  No Known Allergies  History of Present Illness    Tara Balihyllis Louise Admire is a 73 y.o. female with a hx of CAD s/p stent with unknown details in [place] 2008, AVR with tissue valve 2016 in [place] for aortic insufficiency, tobacco use, HTN, HLD, PAD last seen 04/29/2019 by Dr.  Kirke CorinArida.  Hx CAD previous stent [place] in [place] AVR with tissue valve 2016 in [place] for AI. She currently smokes 3 cigarettes a day and has not been able to quit since her son committed suicide 3 years ago.  She is not interested in quitting.    She [facility] [place] in [place].  Echocardiogram 09/2018 with normal LVEF she had a Lexiscan with no evidence of ischemia.  She had a lower extremity arterial Doppler which showed significant left SFA disease.  She had a temporal artery biopsy done yesterday 07/12/19.   Since October she reports she gets "charley horses" in her bilateral calves with ambulation. When she is laying at night she will wake up with pain in her R calf. L leg will cramp with walking takes about 4 minutes longer.   Checks her blood pressure regularly at home with readings 130/65.   2 weeks ago she had the episode of shooting pain in her left upper arm that radiated around to her left scapula.  This is consistent with her anginal equivalent.  She does not recall what she was doing at the time that this occurred.  She took 2 nitroglycerin and the discomfort resolved.  She does exercise regularly and tells me she enjoys being very active despite the fact that her legs bother her.  EKGs/Labs/Other Studies Reviewed:   The following  studies were reviewed today: 07/13/19 VAS [place] Lower Ext Art Seg Summary: Right: Resting right ankle-brachial index indicates severe right lower extremity arterial disease. The right toe-brachial index is abnormal.   Left: Resting left ankle-brachial index indicates moderate left lower extremity arterial disease. The left toe-brachial index is abnormal.  VAS [place] lower extremity arterial bilateral Summary: Right: Total occlusion noted in the superficial femoral artery.   Left: 75-99% stenosis noted in the superficial femoral artery. 75-99% stenosis noted in the superficial femoral artery and/or popliteal artery. Total occlusion  noted in the peroneal artery.    EKG:  EKG is ordered today.  The ekg ordered today demonstrates SR 74 bpm with LVH. No acute ST/T wave changes.   Recent Labs: 02/05/2019: TSH 2.380 07/05/2019: ALT 22; BUN 25; Creatinine, Ser 0.84; Hemoglobin 15.4; Platelets 177; Potassium 4.1; Sodium 142  Recent Lipid Panel    Component Value Date/Time   CHOL 217 (H) 02/05/2019 1619   TRIG 199 (H) 02/05/2019 1619   HDL 40 02/05/2019 1619   LDLCALC 137 (H) 02/05/2019 1619    Home Medications   Current Meds  Medication Sig  . ALPRAZolam (XANAX) 0.5 MG tablet Take 1 tablet (0.5 mg total) by mouth daily as needed for anxiety.  Marland Kitchen amoxicillin-clavulanate (AUGMENTIN) 875-125 MG tablet Take 1 tablet by mouth 2 (two) times daily.  Marland Kitchen aspirin EC 325 MG tablet Take 1 tablet by mouth daily.  Marland Kitchen atorvastatin (LIPITOR) 40 MG tablet Take 40 mg by mouth daily.  . citalopram (CELEXA) 10 MG tablet Take 1 tablet (10 mg total) by mouth 2 (two) times daily.  . cyanocobalamin (,VITAMIN B-12,) 1000 MCG/ML injection Inject 1 mL (1,000 mcg total) into the muscle every 30 (thirty) days.  . diclofenac sodium (VOLTAREN) 1 % GEL Apply 4 g topically 4 (four) times daily.  . famotidine (PEPCID) 20 MG tablet Take 1 tablet (20 mg total) by mouth at bedtime.  . fenofibrate micronized (LOFIBRA) 200 MG capsule Take 1 capsule (200 mg total) by mouth daily.  . hydrochlorothiazide (HYDRODIURIL) 25 MG tablet TAKE 1 TABLET(25 MG) BY MOUTH DAILY  . HYDROcodone-acetaminophen (NORCO/VICODIN) 5-325 MG tablet Take 1 tablet by mouth every 4 (four) hours as needed for moderate pain or severe pain.  . Insulin Syringe 27G X 1/2" 1 ML MISC 1 each by Does not apply route every 30 (thirty) days.  . isosorbide mononitrate (IMDUR) 30 MG 24 hr tablet Take 1 tablet (30 mg total) by mouth 2 (two) times a day.  . lidocaine (LIDODERM) 5 % Place 2 patches onto the skin daily. Remove & Discard patch within 12 hours or as directed by MD  . lisinopril (ZESTRIL)  40 MG tablet Take 1 tablet (40 mg total) by mouth 2 (two) times daily.  Marland Kitchen loperamide (IMODIUM) 2 MG capsule Take 2 mg by mouth as needed for diarrhea or loose stools.  . nitroGLYCERIN (NITROSTAT) 0.4 MG SL tablet Place 0.4 mg under the tongue every 5 (five) minutes as needed.   . nortriptyline (PAMELOR) 50 MG capsule Take 1 capsule (50 mg total) by mouth at bedtime.  Marland Kitchen omeprazole (PRILOSEC) 40 MG capsule TK ONE C PO BID  . ondansetron (ZOFRAN) 4 MG tablet TK 1 T PO  Q 6 H PRN  . oxyCODONE-acetaminophen (PERCOCET/ROXICET) 5-325 MG tablet TK 1 T PO Q 6 H PRF PAIN. MAY FILL 02/10/19  . predniSONE (DELTASONE) 20 MG tablet Take 1 tablet (20 mg total) by mouth daily with breakfast.  . pregabalin (LYRICA) 50  MG capsule 1 tab daily for 1 week, then increase to 1 tab BID for week, then 2 tabs in the AM and 1 in the PM for 1 week, then 2 BID after that  . rosuvastatin (CRESTOR) 10 MG tablet TAKE 1 TABLET(10 MG) BY MOUTH DAILY      Review of Systems    Review of Systems  Constitution: Negative for chills, fever and malaise/fatigue.  Cardiovascular: Positive for claudication. Negative for chest pain, dyspnea on exertion, leg swelling, near-syncope, orthopnea, palpitations and syncope.  Respiratory: Negative for cough, shortness of breath and wheezing.   Gastrointestinal: Negative for nausea and vomiting.  Neurological: Negative for dizziness, light-headedness and weakness.   All other systems reviewed and are otherwise negative except as noted above.  Physical Exam    VS:  BP (!) 164/80 (BP Location: Left Arm, Patient Position: Sitting, Cuff Size: Normal)   Pulse 74   Ht 5\' 4"  (1.626 m)   Wt 172 lb (78 kg)   BMI 29.52 kg/m  , BMI Body mass index is 29.52 kg/m. GEN: Well nourished, well developed, in no acute distress. HEENT: normal. Neck: Supple, no JVD, carotid bruits, or masses. Cardiac: RRR, no rubs, or gallops. Gr2/6 systolic murmur in aortic area. No clubbing, cyanosis, edema.  Radials 2+  and equal bilaterally. DP 1+ bilaterally.  Respiratory:  Respirations regular and unlabored, clear to auscultation bilaterally. GI: Soft, nontender, nondistended, BS + x 4. MS: No deformity or atrophy. Skin: Warm and dry, no rash. Neuro:  Strength and sensation are intact. Psych: Normal affect.  Assessment & Plan    1. PAD/R calf claudication - Symptomatic with bilateral calf pain with ambulation R>L.  Vascular duplex and ABI today shows R total occlusion in SFA. L 75-99% stenosis in SFA. 75-99% stenosis in SFA and/or popliteal. Total occlusion in peroneal artery. She has been recommended for aortogram and is agreeable to proceed. Continue Aspirin 325 mg.   2. S/p bioprosthetic AVR - Grade 2 out of 6 systolic murmur stable on exam.  Echo 09/2018 normal functioning valve.  Continue to monitor.  3. CAD -Lexiscan 09/2018 with no evidence of ischemia.  She did have an episode of left shoulder pain 2 weeks ago that is consistent with her anginal equivalent.  It was relieved by 2 nitroglycerin.  She does have plenty of nitroglycerin at home.  Increase a.m. dose of Imdur to 60 mg, and continue 30 mg p.m. dose.  Continue GDMT aspirin, statin, Imdur, as needed nitroglycerin. She is not on beta blocker and declines adjustment to her medication regimen.   4. HTN -BP elevated today.  She reports it is well controlled at home.  She will continue to monitor at home and report SBP consistently greater than 130.  Continue present antihypertensive regimen.  5. Tobacco abuse - Not interested in quitting. Smoking cessation encouraged. Recommend utilization of [id number].  6. HLD, LDL goal less than 70 -reports she is compliant with her atorvastatin 40 mg daily and fenofibrate.  01/2019 lipids rechecked with LDL of 137.  We will recheck her lipids today.  Her cholesterol needs to be very well controlled in setting of PAD and CAD.  Likely will need to transition from atorvastatin 40 to high intensity Crestor 20mg   daily.  Disposition: Plan for abd aortogram and angiography 07/27/2018. Follow up in 4 month(s) with Dr. as previously scheduled unless indicated further based on upcoming procedure.   09/25/2018, NP 07/13/2019, 3:10 PM

## 2019-07-13 NOTE — Anesthesia Postprocedure Evaluation (Deleted)
Anesthesia Post Note  Patient: Tara [name]  Procedure(s) Performed: BIOPSY TEMPORAL ARTERY (Right )  Patient location during evaluation: PACU Anesthesia Type: General Level of consciousness: awake and alert Pain management: pain level controlled Vital Signs Assessment: post-procedure vital signs reviewed and stable Respiratory status: spontaneous breathing, nonlabored ventilation, respiratory function stable and patient connected to nasal cannula oxygen Cardiovascular status: blood pressure returned to baseline and stable Postop Assessment: no apparent nausea or vomiting Anesthetic complications: no     Last Vitals:  Vitals:   07/12/19 1340 07/12/19 1401  BP: 120/71 (!) 147/54  Pulse: 87 86  Resp:  16  Temp: (!) 36.2 C (!) 36.2 C  SpO2: 96% 94%    Last Pain:  Vitals:   07/13/19 0836  TempSrc:   PainSc: 0-No pain                 Vontae Court     

## 2019-07-13 NOTE — Telephone Encounter (Signed)
Requested medication (s) are due for refill today:no  Requested medication (s) are on the active medication list: no  Last refill: 02/05/2019  Future visit scheduled: no  Notes to clinic:  medication was discontinued  Requesting 90 day supply Please advise    Requested Prescriptions  Pending Prescriptions Disp Refills   rosuvastatin (CRESTOR) 10 MG tablet [Pharmacy Med Name: ROSUVASTATIN 10MG  TABLETS] 90 tablet     Sig: TAKE 1 TABLET(10 MG) BY MOUTH DAILY      Cardiovascular:  Antilipid - Statins Failed - 07/13/2019 10:00 AM      Failed - Total Cholesterol in normal range and within 360 days    Cholesterol, Total  Date Value Ref Range Status  02/05/2019 217 (H) 100 - 199 mg/dL Final          Failed - LDL in normal range and within 360 days    LDL Calculated  Date Value Ref Range Status  02/05/2019 137 (H) 0 - 99 mg/dL Final          Failed - Triglycerides in normal range and within 360 days    Triglycerides  Date Value Ref Range Status  02/05/2019 199 (H) 0 - 149 mg/dL Final          Passed - HDL in normal range and within 360 days    HDL  Date Value Ref Range Status  02/05/2019 40 >39 mg/dL Final          Passed - Patient is not pregnant      Passed - Valid encounter within last 12 months    Recent Outpatient Visits           Today Temporal pain   [facility], Megan P, DO   1 week ago Temporal pain   [facility] [place], Megan P, DO   3 weeks ago Other intervertebral disc degeneration, thoracic region   [facility], [place], DO   2 months ago Breast pain, right   [facility], [place], DO   2 months ago Essential hypertension   [facility], [place], DO       Future Appointments             Today Gilford Rile, Martie Lee, NP [place], [place]

## 2019-07-13 NOTE — Anesthesia Postprocedure Evaluation (Signed)
Anesthesia Post Note  Patient: Miryam Mcelhinney Filippini  Procedure(s) Performed: BIOPSY TEMPORAL ARTERY (Right )  Patient location during evaluation: PACU Anesthesia Type: General Level of consciousness: awake and alert Pain management: pain level controlled Vital Signs Assessment: post-procedure vital signs reviewed and stable Respiratory status: spontaneous breathing, nonlabored ventilation, respiratory function stable and patient connected to nasal cannula oxygen Cardiovascular status: blood pressure returned to baseline and stable Postop Assessment: no apparent nausea or vomiting Anesthetic complications: no     Last Vitals:  Vitals:   07/12/19 1340 07/12/19 1401  BP: 120/71 (!) 147/54  Pulse: 87 86  Resp:  16  Temp: (!) 36.2 C (!) 36.2 C  SpO2: 96% 94%    Last Pain:  Vitals:   07/13/19 0836  TempSrc:   PainSc: 0-No pain                 Martha Clan

## 2019-07-13 NOTE — Progress Notes (Signed)
BP 130/65    Subjective:    Patient ID: Tara [name], female    DOB: 05-02-1946, 73 y.o.   MRN: [id number]  HPI: Tara [name] is a 73 y.o. female  Chief Complaint  Patient presents with  . temporal pain   She went to see general surgery on Friday and had her temporal artery biopsy yesterday. She notes that since starting the prednisone she is feeling nearly entirely better. She still has a little pain in her temple and has had some tiredness in her jaw. She is tolerating the prednisone well and feeling well on it. She notes that her sinus issues have gotten better- still having some drainage, especially in the AM. She is seeing cardiology this afternoon.   She also notes that she has not been able to afford the MRI of her thorax. She continues with pain radiating around the side of her R side with an numb area in her R breast. She would like to do something about this. Pain is aching and numb. Nothing makes it better or worse. She is otherwise doing well with no other concerns or complaints at this time.    Relevant past medical, surgical, family and social history reviewed and updated as indicated. Interim medical history since our last visit reviewed. Allergies and medications reviewed and updated.  Review of Systems  Constitutional: Negative.   HENT: Positive for congestion, postnasal drip and rhinorrhea. Negative for dental problem, drooling, ear discharge, ear pain, facial swelling, hearing loss, mouth sores, nosebleeds, sinus pressure, sinus pain, sneezing, sore throat, tinnitus, trouble swallowing and voice change.   Respiratory: Negative.   Cardiovascular: Negative.   Musculoskeletal: Negative.   Neurological: Positive for headaches. Negative for dizziness, tremors, seizures, syncope, facial asymmetry, speech difficulty, weakness, light-headedness and numbness.  Hematological: Negative.   Psychiatric/Behavioral: Negative.     Per HPI unless specifically  indicated above     Objective:    BP 130/65   Wt Readings from Last 3 Encounters:  07/13/19 172 lb (78 kg)  07/12/19 172 lb (78 kg)  07/09/19 180 lb 12.8 oz (82 kg)    Physical Exam Vitals and nursing note reviewed.  Constitutional:      General: She is not in acute distress.    Appearance: Normal appearance. She is not ill-appearing, toxic-appearing or diaphoretic.  HENT:     Head: Normocephalic and atraumatic.     Right Ear: External ear normal.     Left Ear: External ear normal.     Nose: Nose normal.     Mouth/Throat:     Mouth: Mucous membranes are moist.     Pharynx: Oropharynx is clear.  Eyes:     General: No scleral icterus.       Right eye: No discharge.        Left eye: No discharge.     Conjunctiva/sclera: Conjunctivae normal.     Pupils: Pupils are equal, round, and reactive to light.  Pulmonary:     Effort: Pulmonary effort is normal. No respiratory distress.     Comments: Speaking in full sentences Musculoskeletal:        General: Normal range of motion.     Cervical back: Normal range of motion.  Skin:    Coloration: Skin is not jaundiced or pale.     Findings: No bruising, erythema, lesion or rash.  Neurological:     Mental Status: She is alert and oriented to person, place, and time. Mental status is at  baseline.  Psychiatric:        Mood and Affect: Mood normal.        Behavior: Behavior normal.        Thought Content: Thought content normal.        Judgment: Judgment normal.     Results for orders placed or performed during the hospital encounter of 07/09/19  SARS CORONAVIRUS 2 (TAT 6-24 HRS) Nasopharyngeal Nasopharyngeal Swab   Specimen: Nasopharyngeal Swab  Result Value Ref Range   SARS Coronavirus 2 NEGATIVE NEGATIVE      Assessment & Plan:   Problem List Items Addressed This Visit      Musculoskeletal and Integument   Other intervertebral disc degeneration, thoracic region    Cannot afford MRI at this time. Will start lyrica to see  how she does and try to treat symptoms until she is able to get her MRI. Call with any concerns. Continue to monitor.       Relevant Medications   predniSONE (DELTASONE) 20 MG tablet    Other Visit Diagnoses    Temporal pain    -  Primary   Awaiting temporal artery biopsy results. Continue prednisone. Will refer her to neurology. Call with any concerns. Continue to monitor.    Relevant Medications   pregabalin (LYRICA) 50 MG capsule   Other Relevant Orders   Ambulatory referral to Neurology       Follow up plan: Return in about 4 weeks (around 08/10/2019).   . This visit was completed via [employer] due to the restrictions of the COVID-19 pandemic. All issues as above were discussed and addressed. Physical exam was done as above through visual confirmation on doximity. If it was felt that the patient should be evaluated in the office, they were directed there. The patient verbally consented to this visit. . Location of the patient: home . Location of the provider: work . Those involved with this call:  . Provider: Park Liter, DO . CMA: Tiffany Reel, CMA . Front Desk/Registration: Don Perking  . Time spent on call: 25 minutes with patient face to face via video conference. More than 50% of this time was spent in counseling and coordination of care. 40 minutes total spent in review of patient's record and preparation of their chart.

## 2019-07-13 NOTE — Progress Notes (Signed)
  Office Visit    Patient Name: Tara [name] Date of Encounter: 07/13/2019  Primary Care Provider:  Johnson, Megan P, DO Primary Cardiologist:  Muhammad Arida, MD Electrophysiologist:  None   Chief Complaint    Tara [name] is a 73 y.o. female with a hx of CAD s/p stent with unknown details in [place] 2008, AVR with tissue valve 2016 in [place] for aortic insufficiency, tobacco use, HTN, HLD, PAD presents today for left shoulder pain.  Past Medical History    Past Medical History:  Diagnosis Date  . Anxiety   . Barrett's syndrome   . Depression   . Heart murmur   . Hyperlipidemia   . Hypertension   . Myocardial infarction (HCC)    08   Past Surgical History:  Procedure Laterality Date  . ABDOMINAL HYSTERECTOMY    . ARTERY BIOPSY Right 07/12/2019   Procedure: BIOPSY TEMPORAL ARTERY;  Surgeon: Piscoya, Jose, MD;  Location: [facility] ORS;  Service: General;  Laterality: Right;  . BREAST BIOPSY Left 2007   Neg- [place]  . CARDIAC CATHETERIZATION    . CARDIAC VALVE REPLACEMENT    . COLON SURGERY    . COLONOSCOPY WITH PROPOFOL N/A 04/09/2019   Procedure: COLONOSCOPY WITH PROPOFOL;  Surgeon: Tahiliani, Varnita B, MD;  Location: [facility] ENDOSCOPY;  Service: Endoscopy;  Laterality: N/A;  . CORONARY ANGIOPLASTY WITH STENT PLACEMENT  2008   [place]; [facility] Stent placement x 1   . ESOPHAGOGASTRODUODENOSCOPY (EGD) WITH PROPOFOL N/A 04/09/2019   Procedure: ESOPHAGOGASTRODUODENOSCOPY (EGD) WITH PROPOFOL;  Surgeon: Tahiliani, Varnita B, MD;  Location: [facility] ENDOSCOPY;  Service: Endoscopy;  Laterality: N/A;  . EXCISIONAL HEMORRHOIDECTOMY    . EYE SURGERY    . HERNIA REPAIR      Allergies  No Known Allergies  History of Present Illness    Tara [name] is a 73 y.o. female with a hx of CAD s/p stent with unknown details in [place] 2008, AVR with tissue valve 2016 in [place] for aortic insufficiency, tobacco use, HTN, HLD, PAD last seen 04/29/2019 by Dr.  Arida.  Hx CAD previous stent in [place] in [place]. AVR with tissue valve 2016 in [place] for AI. She currently smokes 3 cigarettes a day and has not been able to quit since her son committed suicide 3 years ago.  She is not interested in quitting.    She had cardiac work-up at [facility] [place] in March.  Echocardiogram 09/2018 with normal LVEF she had a Lexiscan with no evidence of ischemia.  She had a lower extremity arterial Doppler which showed significant left SFA disease.  She had a temporal artery biopsy done yesterday 07/12/19.   Since October she reports she gets "charley horses" in her bilateral calves with ambulation. When she is laying at night she will wake up with pain in her R calf. L leg will cramp with walking takes about 4 minutes longer.   Checks her blood pressure regularly at home with readings 130/65.   2 weeks ago she had the episode of shooting pain in her left upper arm that radiated around to her left scapula.  This is consistent with her anginal equivalent.  She does not recall what she was doing at the time that this occurred.  She took 2 nitroglycerin and the discomfort resolved.  She does exercise regularly and tells me she enjoys being very active despite the fact that her legs bother her.  EKGs/Labs/Other Studies Reviewed:   The following   studies were reviewed today: 07/13/19 VAS [place] Lower Ext Art Seg Summary: Right: Resting right ankle-brachial index indicates severe right lower extremity arterial disease. The right toe-brachial index is abnormal.   Left: Resting left ankle-brachial index indicates moderate left lower extremity arterial disease. The left toe-brachial index is abnormal.  VAS [place] lower extremity arterial bilateral Summary: Right: Total occlusion noted in the superficial femoral artery.   Left: 75-99% stenosis noted in the superficial femoral artery. 75-99% stenosis noted in the superficial femoral artery and/or popliteal artery. Total occlusion  noted in the peroneal artery.    EKG:  EKG is ordered today.  The ekg ordered today demonstrates SR 74 bpm with LVH. No acute ST/T wave changes.   Recent Labs: 02/05/2019: TSH 2.380 07/05/2019: ALT 22; BUN 25; Creatinine, Ser 0.84; Hemoglobin 15.4; Platelets 177; Potassium 4.1; Sodium 142  Recent Lipid Panel    Component Value Date/Time   CHOL 217 (H) 02/05/2019 1619   TRIG 199 (H) 02/05/2019 1619   HDL 40 02/05/2019 1619   LDLCALC 137 (H) 02/05/2019 1619    Home Medications   Current Meds  Medication Sig  . ALPRAZolam (XANAX) 0.5 MG tablet Take 1 tablet (0.5 mg total) by mouth daily as needed for anxiety.  Marland Kitchen amoxicillin-clavulanate (AUGMENTIN) 875-125 MG tablet Take 1 tablet by mouth 2 (two) times daily.  Marland Kitchen aspirin EC 325 MG tablet Take 1 tablet by mouth daily.  Marland Kitchen atorvastatin (LIPITOR) 40 MG tablet Take 40 mg by mouth daily.  . citalopram (CELEXA) 10 MG tablet Take 1 tablet (10 mg total) by mouth 2 (two) times daily.  . cyanocobalamin (,VITAMIN B-12,) 1000 MCG/ML injection Inject 1 mL (1,000 mcg total) into the muscle every 30 (thirty) days.  . diclofenac sodium (VOLTAREN) 1 % GEL Apply 4 g topically 4 (four) times daily.  . famotidine (PEPCID) 20 MG tablet Take 1 tablet (20 mg total) by mouth at bedtime.  . fenofibrate micronized (LOFIBRA) 200 MG capsule Take 1 capsule (200 mg total) by mouth daily.  . hydrochlorothiazide (HYDRODIURIL) 25 MG tablet TAKE 1 TABLET(25 MG) BY MOUTH DAILY  . HYDROcodone-acetaminophen (NORCO/VICODIN) 5-325 MG tablet Take 1 tablet by mouth every 4 (four) hours as needed for moderate pain or severe pain.  . Insulin Syringe 27G X 1/2" 1 ML MISC 1 each by Does not apply route every 30 (thirty) days.  . isosorbide mononitrate (IMDUR) 30 MG 24 hr tablet Take 1 tablet (30 mg total) by mouth 2 (two) times a day.  . lidocaine (LIDODERM) 5 % Place 2 patches onto the skin daily. Remove & Discard patch within 12 hours or as directed by MD  . lisinopril (ZESTRIL)  40 MG tablet Take 1 tablet (40 mg total) by mouth 2 (two) times daily.  Marland Kitchen loperamide (IMODIUM) 2 MG capsule Take 2 mg by mouth as needed for diarrhea or loose stools.  . nitroGLYCERIN (NITROSTAT) 0.4 MG SL tablet Place 0.4 mg under the tongue every 5 (five) minutes as needed.   . nortriptyline (PAMELOR) 50 MG capsule Take 1 capsule (50 mg total) by mouth at bedtime.  Marland Kitchen omeprazole (PRILOSEC) 40 MG capsule TK ONE C PO BID  . ondansetron (ZOFRAN) 4 MG tablet TK 1 T PO  Q 6 H PRN  . oxyCODONE-acetaminophen (PERCOCET/ROXICET) 5-325 MG tablet TK 1 T PO Q 6 H PRF PAIN. MAY FILL 02/10/19  . predniSONE (DELTASONE) 20 MG tablet Take 1 tablet (20 mg total) by mouth daily with breakfast.  . pregabalin (LYRICA) 50  MG capsule 1 tab daily for 1 week, then increase to 1 tab BID for week, then 2 tabs in the AM and 1 in the PM for 1 week, then 2 BID after that  . rosuvastatin (CRESTOR) 10 MG tablet TAKE 1 TABLET(10 MG) BY MOUTH DAILY      Review of Systems    Review of Systems  Constitution: Negative for chills, fever and malaise/fatigue.  Cardiovascular: Positive for claudication. Negative for chest pain, dyspnea on exertion, leg swelling, near-syncope, orthopnea, palpitations and syncope.  Respiratory: Negative for cough, shortness of breath and wheezing.   Gastrointestinal: Negative for nausea and vomiting.  Neurological: Negative for dizziness, light-headedness and weakness.   All other systems reviewed and are otherwise negative except as noted above.  Physical Exam    VS:  BP (!) 164/80 (BP Location: Left Arm, Patient Position: Sitting, Cuff Size: Normal)   Pulse 74   Ht 5\' 4"  (1.626 m)   Wt 172 lb (78 kg)   BMI 29.52 kg/m  , BMI Body mass index is 29.52 kg/m. GEN: Well nourished, well developed, in no acute distress. HEENT: normal. Neck: Supple, no JVD, carotid bruits, or masses. Cardiac: RRR, no rubs, or gallops. Gr2/6 systolic murmur in aortic area. No clubbing, cyanosis, edema.  Radials 2+  and equal bilaterally. DP 1+ bilaterally.  Respiratory:  Respirations regular and unlabored, clear to auscultation bilaterally. GI: Soft, nontender, nondistended, BS + x 4. MS: No deformity or atrophy. Skin: Warm and dry, no rash. Neuro:  Strength and sensation are intact. Psych: Normal affect.  Assessment & Plan    1. PAD/R calf claudication - Symptomatic with bilateral calf pain with ambulation R>L.  Vascular duplex and ABI today shows R total occlusion in SFA. L 75-99% stenosis in SFA. 75-99% stenosis in SFA and/or popliteal. Total occlusion in peroneal artery. She has been recommended for aortogram and is agreeable to proceed. Continue Aspirin 325 mg.   2. S/p bioprosthetic AVR - Grade 2 out of 6 systolic murmur stable on exam.  Echo 09/2018 normal functioning valve.  Continue to monitor.  3. CAD -Lexiscan 09/2018 with no evidence of ischemia.  She did have an episode of left shoulder pain 2 weeks ago that is consistent with her anginal equivalent.  It was relieved by 2 nitroglycerin.  She does have plenty of nitroglycerin at home.  Increase a.m. dose of Imdur to 60 mg, and continue 30 mg p.m. dose.  Continue GDMT aspirin, statin, Imdur, as needed nitroglycerin. She is not on beta blocker and declines adjustment to her medication regimen.   4. HTN -BP elevated today.  She reports it is well controlled at home.  She will continue to monitor at home and report SBP consistently greater than 130.  Continue present antihypertensive regimen.  5. Tobacco abuse - Not interested in quitting. Smoking cessation encouraged. Recommend utilization of [id number].  6. HLD, LDL goal less than 70 -reports she is compliant with her atorvastatin 40 mg daily and fenofibrate.  01/2019 lipids rechecked with LDL of 137.  We will recheck her lipids today.  Her cholesterol needs to be very well controlled in setting of PAD and CAD.  Likely will need to transition from atorvastatin 40 to high intensity Crestor 20mg   daily.  Disposition: Plan for abd aortogram and angiography 07/27/2018. Follow up in 4 month(s) with Dr. as previously scheduled unless indicated further based on upcoming procedure.   09/25/2018, NP 07/13/2019, 3:10 PM

## 2019-07-14 ENCOUNTER — Encounter: Payer: Self-pay | Admitting: Family Medicine

## 2019-07-14 ENCOUNTER — Telehealth: Payer: Self-pay | Admitting: Family Medicine

## 2019-07-14 LAB — SURGICAL PATHOLOGY

## 2019-07-14 NOTE — Telephone Encounter (Signed)
I called the cath lab at [facility] and placed the patient on the schedule for Dr. Fletcher Anon:  Wednesday 07/28/2019 Arrive at 8:30 am for a 10:30 am procedure  Staff message sent to precert.  Will forward to Dr. Fletcher Anon for orders and to Lattie Haw, RN to please contact the patient with her pre-procedure instructions.   I also called PAT and left a message to add the patient on the schedule for Monday 07/26/2019 for her pre-procedure COVID swab.   I called the patient to make her aware that her procedure will be on 07/28/2019 and that Lattie Haw will call her next week with more details of the procedure. Per the patient, her daughter will have to bring her and is coming from out of town. The earliest the patient can be at the hospital is 10:30 am.  I advised her I will try to get her procedure pushed back. She states she is "running around" today getting ready for last minute things for Christmas and would prefer a call back on Monday with the details of her procedure. She is unaware of her COVID swab/ any other instructions at this time.  I did call the cath lab back after speaking with the patient and her case has been pushed back to 12:30 pm (arrive at 10:30 am).

## 2019-07-14 NOTE — Progress Notes (Signed)
LVM to make 4 week fu, sent letter.

## 2019-07-14 NOTE — Telephone Encounter (Signed)
Patient notified and verbalized understanding. 

## 2019-07-14 NOTE — Telephone Encounter (Signed)
Please let her know that her biopsy came back negative so we can wean her off the prednisone. I'd like her to take 1/2 tab for a 4 days, then 1/2 tab every other day for 4 days and then she can stop it. Thanks!

## 2019-07-15 LAB — LDL CHOLESTEROL, DIRECT: Direct LDL: 145.7 mg/dL — ABNORMAL HIGH (ref 0–99)

## 2019-07-15 NOTE — Telephone Encounter (Signed)
I placed orders.  Thanks.

## 2019-07-19 ENCOUNTER — Telehealth: Payer: Self-pay

## 2019-07-19 NOTE — Telephone Encounter (Signed)
-----   Message from Loel Dubonnet, NP sent at 07/18/2019  9:04 PM EST ----- Cholesterol panel above goal. In setting of CAD would like LDL less than 70. Result showed direct LDL 145.7. Please stop Atorvastatin. Please start Crestor 40mg  daily. Recheck lipid/hepatic panel in 6-8 weeks.

## 2019-07-19 NOTE — Telephone Encounter (Signed)
-----   Message from Wellington Hampshire, MD sent at 07/15/2019 10:06 AM EST ----- Severely reduced ABI on the right side with severe symptoms.  The patient is scheduled for an angiogram on January 6.  Please double check and make sure she will get preprocedure labs done and Covid testing.

## 2019-07-19 NOTE — Telephone Encounter (Signed)
Called to give the patient LE study results and the patients pre-procedure instructions. lmtcb. 

## 2019-07-19 NOTE — Telephone Encounter (Signed)
Called to give the patient LE study results and the patients pre-procedure instructions. lmtcb.

## 2019-07-19 NOTE — Addendum Note (Signed)
Addended by: Lamar Laundry on: 07/19/2019 12:28 PM   Modules accepted: Orders

## 2019-07-19 NOTE — Telephone Encounter (Signed)
Attempted to call patient. LMTCB 07/19/2019   

## 2019-07-19 NOTE — Telephone Encounter (Signed)
Spoke with the patient. Patient made aware of pre-procedure instructions. -Patient will have her COVID test on 07/25/18 at the [facility] drive up test site. -Patient will have her pre-procedures labs (bmet, cbc) drawn on 07/25/18 at the [place] -Patient is NPO after midnight for a 07/27/18 procedure -Patient advised that she is to HOLD HCTZ the morning of the procedure. She can take her other morning meds with a sip of water. - Patient advised to report to [facility] tower on 07/27/18@ 10:30am for a 12:30pm procedure (verified the scheduled time with Aaron Edelman at the [facility] cath lab) -Patient advised to plan for a possible overnight stay -Patient advised that she may have 1 support person and that someone will need to be available to drive her home.  Patient had additional question regarding the procedure. Patient questions answered to the best of my ability. Patient has no other questions or concerns at this time.  Patient verbalized understanding to the instruction given and voiced appreciation for the assistance.

## 2019-07-26 ENCOUNTER — Other Ambulatory Visit: Payer: Self-pay

## 2019-07-26 ENCOUNTER — Telehealth (INDEPENDENT_AMBULATORY_CARE_PROVIDER_SITE_OTHER): Payer: Federal, State, Local not specified - PPO | Admitting: Surgery

## 2019-07-26 ENCOUNTER — Inpatient Hospital Stay: Admission: RE | Admit: 2019-07-26 | Payer: Federal, State, Local not specified - PPO | Source: Ambulatory Visit

## 2019-07-26 DIAGNOSIS — M316 Other giant cell arteritis: Secondary | ICD-10-CM

## 2019-07-26 NOTE — Progress Notes (Signed)
Virtual Visit via Telephone Note  I connected with Tara [name] on 07/26/19 at  3:00 PM EST by telephone and verified that I am speaking with the correct person using two identifiers.  Location: Patient:  Home Provider: Office   I discussed the limitations, risks, security and privacy concerns of performing an evaluation and management service by telephone and the availability of in person appointments. I also discussed with the patient that there may be a patient responsible charge related to this service. The patient expressed understanding and agreed to proceed.   History of Present Illness: 74 yo female s/p right temporal artery biopsy on 12/21 for suspected temporal arteritis.  Patient reports that she's doing better and the right jaw and headache pain has improved.  She still has issues with numbness of the left face/cheek area.  From the incision standpoint, she reports that the wound had some bruising and swelling initially but has since resolved and the hair is growing back.     Observations/Objective: Does not appear to be in an acute distress and is able to converse in full sentences without any shortness of breath.  Assessment and Plan: 74 yo female s/p right temporal artery biopsy.  --Discussed pathology results with her -- negative for temporal arteritis.  Unclear if this was as a result of the ongoing steroid management at that point, or if she may have had a different diagnosis.  However, I agree that her symptoms were suspicious for temporal arteritis. --Patient has an appointment with Neurology next week to discuss her symptoms further.  I encouraged her to keep the appointment. --Follow up prn.  Follow Up Instructions:    I discussed the assessment and treatment plan with the patient. The patient was provided an opportunity to ask questions and all were answered. The patient agreed with the plan and demonstrated an understanding of the instructions.   The  patient was advised to call back or seek an in-person evaluation if the symptoms worsen or if the condition fails to improve as anticipated.  I provided 15 minutes of non-face-to-face time during this encounter.   Henrene Dodge, MD

## 2019-07-27 ENCOUNTER — Other Ambulatory Visit: Payer: Self-pay

## 2019-07-27 ENCOUNTER — Other Ambulatory Visit: Payer: Self-pay | Admitting: Family Medicine

## 2019-07-27 ENCOUNTER — Telehealth: Payer: Self-pay | Admitting: *Deleted

## 2019-07-27 ENCOUNTER — Other Ambulatory Visit
Admission: RE | Admit: 2019-07-27 | Discharge: 2019-07-27 | Disposition: A | Discharge status: Home / Self Care | Payer: Federal, State, Local not specified - PPO | Source: Ambulatory Visit | Attending: Cardiovascular Disease | Admitting: Cardiovascular Disease

## 2019-07-27 DIAGNOSIS — Z20822 Contact with and (suspected) exposure to covid-19: Secondary | ICD-10-CM | POA: Insufficient documentation

## 2019-07-27 DIAGNOSIS — I739 Peripheral vascular disease, unspecified: Secondary | ICD-10-CM

## 2019-07-27 DIAGNOSIS — Z01812 Encounter for preprocedural laboratory examination: Secondary | ICD-10-CM | POA: Diagnosis present

## 2019-07-27 LAB — CBC WITH DIFFERENTIAL/PLATELET
Abs Immature Granulocytes: 0.02 10*3/uL (ref 0.00–0.07)
Basophils Absolute: 0 10*3/uL (ref 0.0–0.1)
Basophils Relative: 0 %
Eosinophils Absolute: 0.1 10*3/uL (ref 0.0–0.5)
Eosinophils Relative: 1 %
HCT: 45.9 % (ref 36.0–46.0)
Hemoglobin: 14.9 g/dL (ref 12.0–15.0)
Immature Granulocytes: 0 %
Lymphocytes Relative: 26 %
Lymphs Abs: 2 10*3/uL (ref 0.7–4.0)
MCH: 28.7 pg (ref 26.0–34.0)
MCHC: 32.5 g/dL (ref 30.0–36.0)
MCV: 88.4 fL (ref 80.0–100.0)
Monocytes Absolute: 0.5 10*3/uL (ref 0.1–1.0)
Monocytes Relative: 7 %
Neutro Abs: 5.2 10*3/uL (ref 1.7–7.7)
Neutrophils Relative %: 66 %
Platelets: 161 10*3/uL (ref 150–400)
RBC: 5.19 MIL/uL — ABNORMAL HIGH (ref 3.87–5.11)
RDW: 13.9 % (ref 11.5–15.5)
WBC: 7.9 10*3/uL (ref 4.0–10.5)
nRBC: 0 % (ref 0.0–0.2)

## 2019-07-27 LAB — BASIC METABOLIC PANEL
Anion gap: 9 (ref 5–15)
BUN: 25 mg/dL — ABNORMAL HIGH (ref 8–23)
CO2: 27 mmol/L (ref 22–32)
Calcium: 9.6 mg/dL (ref 8.9–10.3)
Chloride: 105 mmol/L (ref 98–111)
Creatinine, Ser: 0.74 mg/dL (ref 0.44–1.00)
GFR calc Af Amer: >60 mL/min (ref >60)
GFR calc non Af Amer: >60 mL/min (ref >60)
Glucose, Bld: 113 mg/dL — ABNORMAL HIGH (ref 70–99)
Potassium: 4.1 mmol/L (ref 3.5–5.1)
Sodium: 141 mmol/L (ref 135–145)

## 2019-07-27 NOTE — Progress Notes (Signed)
Patient did not report for her pre-proecduer labs on the date instructed. Patient procedure is scheduled for 07/27/18. Patient will have labs drawn today. STAT Bmet and Cbc placed to insure results prior to the procedure.

## 2019-07-27 NOTE — Telephone Encounter (Addendum)
Pt contacted pre-catheterization scheduled at [facility] for: Wednesday July 28, 2019 12:30 PM Verified arrival time and place: [facility] Main Entrance [facility]) at: 10:30 AM   No solid food after midnight prior to cath, clear liquids until 5 AM day of procedure. Contrast allergy:no  Hold HCTZ-AM of procedure   Except hold medications AM meds can be  taken pre-cath with sip of water including: ASA 81 mg   Confirmed patient has responsible adult to drive home post procedure and observe 24 hours after arriving home: yes  Currently, due to Covid-19 pandemic, only one support person will be allowed with patient. Must be the same support person for that patient's entire stay, will be screened and required to wear a mask. They will be asked to wait in the waiting room for the duration of the patient's stay.  Patients are required to wear a mask when they enter the hospital.      COVID-19 Pre-Screening Questions:  . In the past 7 to 10 days have you had a cough,  shortness of breath, headache, congestion, fever (100 or greater) body aches, chills, sore throat, or sudden loss of taste or sense of smell? no . Have you been around anyone with known Covid 19? no . Have you been around anyone who is awaiting Covid 19 test results in the past 7 to 10 days? no . Have you been around anyone who has been exposed to Covid 19, or has mentioned symptoms of Covid 19 within the past 7 to 10 days? no   I reviewed procedure/mask/visitor, Covid-19 screening questions with patient, she verbalized understanding, thanked me for call.       Pt did not get pre procedure  Covid-19 test /BMP/CBC yesterday, pt will go today for Covid-19 test  before 10:30 AM (per test site-if pt arrives before 10:30 AM results should be available for procedure tomorrow), then BMP/CBC, ordered stat to have results for procedure tomorrow.

## 2019-07-28 ENCOUNTER — Other Ambulatory Visit: Payer: Self-pay

## 2019-07-28 ENCOUNTER — Ambulatory Visit (HOSPITAL_COMMUNITY)
Admission: RE | Admit: 2019-07-28 | Discharge: 2019-07-28 | Disposition: A | Discharge status: Home / Self Care | Payer: Federal, State, Local not specified - PPO | Attending: Cardiovascular Disease | Admitting: Cardiovascular Disease

## 2019-07-28 ENCOUNTER — Encounter (HOSPITAL_COMMUNITY)
Admission: RE | Disposition: A | Discharge status: Home / Self Care | Payer: Self-pay | Source: Home / Self Care | Attending: Cardiovascular Disease

## 2019-07-28 ENCOUNTER — Other Ambulatory Visit: Payer: Self-pay | Admitting: Family Medicine

## 2019-07-28 DIAGNOSIS — I2510 Atherosclerotic heart disease of native coronary artery without angina pectoris: Secondary | ICD-10-CM | POA: Insufficient documentation

## 2019-07-28 DIAGNOSIS — Z7982 Long term (current) use of aspirin: Secondary | ICD-10-CM | POA: Diagnosis not present

## 2019-07-28 DIAGNOSIS — I252 Old myocardial infarction: Secondary | ICD-10-CM | POA: Diagnosis not present

## 2019-07-28 DIAGNOSIS — Z79899 Other long term (current) drug therapy: Secondary | ICD-10-CM | POA: Diagnosis not present

## 2019-07-28 DIAGNOSIS — F17210 Nicotine dependence, cigarettes, uncomplicated: Secondary | ICD-10-CM | POA: Insufficient documentation

## 2019-07-28 DIAGNOSIS — Z953 Presence of xenogenic heart valve: Secondary | ICD-10-CM | POA: Diagnosis not present

## 2019-07-28 DIAGNOSIS — I10 Essential (primary) hypertension: Secondary | ICD-10-CM | POA: Insufficient documentation

## 2019-07-28 DIAGNOSIS — I70221 Atherosclerosis of native arteries of extremities with rest pain, right leg: Secondary | ICD-10-CM | POA: Insufficient documentation

## 2019-07-28 DIAGNOSIS — E785 Hyperlipidemia, unspecified: Secondary | ICD-10-CM | POA: Insufficient documentation

## 2019-07-28 DIAGNOSIS — Z7952 Long term (current) use of systemic steroids: Secondary | ICD-10-CM | POA: Diagnosis not present

## 2019-07-28 DIAGNOSIS — Z955 Presence of coronary angioplasty implant and graft: Secondary | ICD-10-CM | POA: Insufficient documentation

## 2019-07-28 DIAGNOSIS — Z95828 Presence of other vascular implants and grafts: Secondary | ICD-10-CM | POA: Insufficient documentation

## 2019-07-28 DIAGNOSIS — I739 Peripheral vascular disease, unspecified: Secondary | ICD-10-CM

## 2019-07-28 HISTORY — PX: ABDOMINAL AORTOGRAM W/LOWER EXTREMITY: CATH118223

## 2019-07-28 LAB — SARS CORONAVIRUS 2 (TAT 6-24 HRS): SARS Coronavirus 2: NEGATIVE

## 2019-07-28 SURGERY — ABDOMINAL AORTOGRAM W/LOWER EXTREMITY
Anesthesia: LOCAL

## 2019-07-28 MED ORDER — ONDANSETRON HCL 4 MG/2ML IJ SOLN: 4.0000 mg | Freq: Four times a day (QID) | INTRAMUSCULAR | Status: DC | PRN

## 2019-07-28 MED ORDER — SODIUM CHLORIDE 0.9% FLUSH: 3.0000 mL | INTRAVENOUS | Status: DC | PRN

## 2019-07-28 MED ORDER — MIDAZOLAM HCL 2 MG/2ML IJ SOLN
INTRAMUSCULAR | Status: DC | PRN
  Administered 2019-07-28: 2 mg via INTRAVENOUS
  Administered 2019-07-28: 1 mg via INTRAVENOUS

## 2019-07-28 MED ORDER — SODIUM CHLORIDE 0.9% FLUSH: 3.0000 mL | Freq: Two times a day (BID) | INTRAVENOUS | Status: DC

## 2019-07-28 MED ORDER — LIDOCAINE HCL (PF) 1 % IJ SOLN
INTRAMUSCULAR | Status: DC | PRN
  Administered 2019-07-28: 20 mL via INTRADERMAL

## 2019-07-28 MED ORDER — HEPARIN (PORCINE) IN NACL 1000-0.9 UT/500ML-% IV SOLN
INTRAVENOUS | Status: AC
  Filled 2019-07-28: qty 500

## 2019-07-28 MED ORDER — SODIUM CHLORIDE 0.9 % IV SOLN: 250.0000 mL | INTRAVENOUS | Status: DC | PRN

## 2019-07-28 MED ORDER — OXYCODONE HCL 5 MG PO TABS
ORAL_TABLET | ORAL | Status: AC
  Administered 2019-07-28: 10 mg via ORAL
  Filled 2019-07-28: qty 2

## 2019-07-28 MED ORDER — FENTANYL CITRATE (PF) 100 MCG/2ML IJ SOLN
INTRAMUSCULAR | Status: DC | PRN
  Administered 2019-07-28 (×2): 50 ug via INTRAVENOUS

## 2019-07-28 MED ORDER — MIDAZOLAM HCL 2 MG/2ML IJ SOLN
INTRAMUSCULAR | Status: AC
  Filled 2019-07-28: qty 2

## 2019-07-28 MED ORDER — DIAZEPAM 5 MG PO TABS
5.0000 mg | ORAL_TABLET | Freq: Once | ORAL | Status: AC
  Administered 2019-07-28: 5 mg via ORAL
  Filled 2019-07-28: qty 1

## 2019-07-28 MED ORDER — SODIUM CHLORIDE 0.9 % WEIGHT BASED INFUSION
3.0000 mL/kg/h | INTRAVENOUS | Status: AC
  Administered 2019-07-28: 3 mL/kg/h via INTRAVENOUS

## 2019-07-28 MED ORDER — IODIXANOL 320 MG/ML IV SOLN
INTRAVENOUS | Status: DC | PRN
  Administered 2019-07-28: 95 mL via INTRA_ARTERIAL

## 2019-07-28 MED ORDER — LABETALOL HCL 5 MG/ML IV SOLN: 10.0000 mg | INTRAVENOUS | Status: DC | PRN

## 2019-07-28 MED ORDER — DIAZEPAM 5 MG PO TABS
ORAL_TABLET | ORAL | Status: AC
  Filled 2019-07-28: qty 1

## 2019-07-28 MED ORDER — HEPARIN (PORCINE) IN NACL 1000-0.9 UT/500ML-% IV SOLN
INTRAVENOUS | Status: DC | PRN
  Administered 2019-07-28 (×2): 500 mL

## 2019-07-28 MED ORDER — ACETAMINOPHEN 325 MG PO TABS: 650.0000 mg | ORAL_TABLET | ORAL | Status: DC | PRN

## 2019-07-28 MED ORDER — SODIUM CHLORIDE 0.9 % WEIGHT BASED INFUSION
1.0000 mL/kg/h | INTRAVENOUS | Status: DC
  Administered 2019-07-28: 1 mL/kg/h via INTRAVENOUS

## 2019-07-28 MED ORDER — FENTANYL CITRATE (PF) 100 MCG/2ML IJ SOLN
INTRAMUSCULAR | Status: AC
  Filled 2019-07-28: qty 2

## 2019-07-28 MED ORDER — SODIUM CHLORIDE 0.9 % IV SOLN: INTRAVENOUS | Status: DC

## 2019-07-28 MED ORDER — OXYCODONE HCL 5 MG PO TABS: 5.0000 mg | ORAL_TABLET | ORAL | Status: DC | PRN

## 2019-07-28 MED ORDER — ASPIRIN 81 MG PO CHEW: 81.0000 mg | CHEWABLE_TABLET | ORAL | Status: DC

## 2019-07-28 MED ORDER — LIDOCAINE HCL (PF) 1 % IJ SOLN
INTRAMUSCULAR | Status: AC
  Filled 2019-07-28: qty 30

## 2019-07-28 SURGICAL SUPPLY — 14 items
CATH ANGIO 5F PIGTAIL 65CM (CATHETERS) ×1 IMPLANT
CATH CROSS OVER TEMPO 5F (CATHETERS) ×1 IMPLANT
CATH STRAIGHT 5FR 65CM (CATHETERS) ×1 IMPLANT
CLOSURE MYNX CONTROL 5F (Vascular Products) ×1 IMPLANT
KIT MICROPUNCTURE NIT STIFF (SHEATH) ×1 IMPLANT
KIT PV (KITS) ×2 IMPLANT
SHEATH PINNACLE 5F 10CM (SHEATH) ×1 IMPLANT
SHEATH PROBE COVER 6X72 (BAG) ×1 IMPLANT
STOPCOCK MORSE 400PSI 3WAY (MISCELLANEOUS) ×1 IMPLANT
SYR MEDRAD MARK 7 150ML (SYRINGE) ×2 IMPLANT
TRANSDUCER W/STOPCOCK (MISCELLANEOUS) ×2 IMPLANT
TRAY PV CATH (CUSTOM PROCEDURE TRAY) ×2 IMPLANT
TUBING CIL FLEX 10 FLL-RA (TUBING) ×1 IMPLANT
WIRE BENTSON .035X145CM (WIRE) ×1 IMPLANT

## 2019-07-28 NOTE — Interval H&P Note (Signed)
History and Physical Interval Note: The patient has severe right calf claudication and rest pain at night.  In addition, she complains of worsening left calf claudication.  She also reports bluish discoloration of the right toes which became noticeable on Monday.    07/28/2019 1:16 PM  Tara [name]  has presented today for surgery, with the diagnosis of pad.  The various methods of treatment have been discussed with the patient and family. After consideration of risks, benefits and other options for treatment, the patient has consented to  Procedure(s): ABDOMINAL AORTOGRAM W/LOWER EXTREMITY (N/A) as a surgical intervention.  The patient's history has been reviewed, patient examined, no change in status, stable for surgery.  I have reviewed the patient's chart and labs.  Questions were answered to the patient's satisfaction.     Lorine Bears

## 2019-07-28 NOTE — Telephone Encounter (Signed)
Requested medication (s) are due for refill today: no  Requested medication (s) are on the active medication list: yes  Last refill:  07/02/19  Future visit scheduled:no  Notes to clinic:   Medication not assigned to a protocol, review manually  Requested Prescriptions  Pending Prescriptions Disp Refills   amoxicillin-clavulanate (AUGMENTIN) 875-125 MG tablet [Pharmacy Med Name: AMOX-CLAV 875MG  TABLETS] 20 tablet 0    Sig: TAKE 1 TABLET BY MOUTH TWICE DAILY      Off-Protocol Failed - 07/28/2019  3:26 AM      Failed - Medication not assigned to a protocol, review manually.      Passed - Valid encounter within last 12 months    Recent Outpatient Visits           2 weeks ago Temporal pain   [facility] [place], Megan P, DO   3 weeks ago Temporal pain   [facility] [place], Megan P, DO   1 month ago Other intervertebral disc degeneration, thoracic region   [facility], [place], DO   2 months ago Breast pain, right   [facility], [place], DO   3 months ago Essential hypertension   [facility] [place], Megan P, DO                valACYclovir (VALTREX) 1000 MG tablet [Pharmacy Med Name: VALACYCLOVIR 1GM TABLETS] 20 tablet 0    Sig: TAKE 1 TABLET(1000 MG) BY MOUTH TWICE DAILY      Antimicrobials:  Antiviral Agents - Anti-Herpetic Passed - 07/28/2019  3:26 AM      Passed - Valid encounter within last 12 months    Recent Outpatient Visits           2 weeks ago Temporal pain   [facility] [place], Megan P, DO   3 weeks ago Temporal pain   [facility] [place], Megan P, DO   1 month ago Other intervertebral disc degeneration, thoracic region   [facility], Megan P, DO   2 months ago Breast pain, right   [facility], [place], DO   3 months ago Essential hypertension   [facility] [place], [place], DO

## 2019-07-28 NOTE — Discharge Instructions (Signed)
Angiogram, Care After This sheet gives you information about how to care for yourself after your procedure. Your health care provider may also give you more specific instructions. If you have problems or questions, contact your health care provider. What can I expect after the procedure? After the procedure, it is common to have bruising and tenderness at the catheter insertion area. Follow these instructions at home: Insertion site care  Follow instructions from your health care provider about how to take care of your insertion site. Make sure you: ? Wash your hands with soap and water before you change your bandage (dressing). If soap and water are not available, use hand sanitizer. ? Change your dressing as told by your health care provider. ? Leave stitches (sutures), skin glue, or adhesive strips in place. These skin closures may need to stay in place for 2 weeks or longer. If adhesive strip edges start to loosen and curl up, you may trim the loose edges. Do not remove adhesive strips completely unless your health care provider tells you to do that.  Do not take baths, swim, or use a hot tub until your health care provider approves.  You may shower 24-48 hours after the procedure or as told by your health care provider. ? Gently wash the site with plain soap and water. ? Pat the area dry with a clean towel. ? Do not rub the site. This may cause bleeding.  Do not apply powder or lotion to the site. Keep the site clean and dry.  Check your insertion site every day for signs of infection. Check for: ? Redness, swelling, or pain. ? Fluid or blood. ? Warmth. ? Pus or a bad smell. Activity  Rest as told by your health care provider, usually for 1-2 days.  Do not lift anything that is heavier than 10 lbs. (4.5 kg) or as told by your health care provider.  Do not drive for 24 hours if you were given a medicine to help you relax (sedative).  Do not drive or use heavy machinery while  taking prescription pain medicine. General instructions   Return to your normal activities as told by your health care provider, usually in about a week. Ask your health care provider what activities are safe for you.  If the catheter site starts bleeding, lie flat and put pressure on the site. If the bleeding does not stop, get help right away. This is a medical emergency.  Drink enough fluid to keep your urine clear or pale yellow. This helps flush the contrast dye from your body.  Take over-the-counter and prescription medicines only as told by your health care provider.  Keep all follow-up visits as told by your health care provider. This is important. Contact a health care provider if:  You have a fever or chills.  You have redness, swelling, or pain around your insertion site.  You have fluid or blood coming from your insertion site.  The insertion site feels warm to the touch.  You have pus or a bad smell coming from your insertion site.  You have bruising around the insertion site.  You notice blood collecting in the tissue around the catheter site (hematoma). The hematoma may be painful to the touch. Get help right away if:  You have severe pain at the catheter insertion area.  The catheter insertion area swells very fast.  The catheter insertion area is bleeding, and the bleeding does not stop when you hold steady pressure on the area.    The area near or just beyond the catheter insertion site becomes pale, cool, tingly, or numb. These symptoms may represent a serious problem that is an emergency. Do not wait to see if the symptoms will go away. Get medical help right away. Call your local emergency services (911 in the [place] Do not drive yourself to the hospital. Summary  After the procedure, it is common to have bruising and tenderness at the catheter insertion area.  After the procedure, it is important to rest and drink plenty of fluids.  Do not take baths,  swim, or use a hot tub until your health care provider says it is okay to do so. You may shower 24-48 hours after the procedure or as told by your health care provider.  If the catheter site starts bleeding, lie flat and put pressure on the site. If the bleeding does not stop, get help right away. This is a medical emergency. This information is not intended to replace advice given to you by your health care provider. Make sure you discuss any questions you have with your health care provider. Document Revised: 06/20/2017 Document Reviewed: 06/12/2016 Elsevier Patient Education  2020 [employer]  

## 2019-07-28 NOTE — H&P (View-Only) (Signed)
Hospital Consult    Reason for Consult:  RLE rest pain and ischemic tissue changes R 5th toe Requesting Physician:  Dr. Kirke Corin MRN #:  [id number]  History of Present Illness: This is a 74 y.o. female with past medical history significant for hyperlipidemia, hypertension, CAD with history of coronary stent as well as history of AVR with tissue valve, tobacco use, and PAD.  She underwent aortogram with bilateral lower extremity runoff by her cardiologist Dr. Kirke Corin this afternoon.  Angiography demonstrates long segment occlusion of right SFA starting just after origin and reconstituting in distal SFA with a normal appearing popliteal artery.  She complains of rest pain in her right shin and right foot over the past several months with severe and worsening claudication symptoms right worse than left calf.  Patient also complains of bluish discoloration of right fifth toe starting this past Monday, 07/26/2019.  Surgical history also significant for left common iliac artery stents.  She is a tobacco user down to about 3 cigarettes a day.  She is on aspirin and statin daily.  It should also be noted that left lower extremity runoff showed a diseased SFA but patent with single-vessel runoff via anterior tibial artery.  Past Medical History:  Diagnosis Date  . Anxiety   . Barrett's syndrome   . Depression   . Heart murmur   . Hyperlipidemia   . Hypertension   . Myocardial infarction [facility])    08    Past Surgical History:  Procedure Laterality Date  . ABDOMINAL HYSTERECTOMY    . ARTERY BIOPSY Right 07/12/2019   Procedure: BIOPSY TEMPORAL ARTERY;  Surgeon: Henrene Dodge, MD;  Location: [facility] ORS;  Service: General;  Laterality: Right;  . BREAST BIOPSY Left 2007   Neg- [place]  . CARDIAC CATHETERIZATION    . CARDIAC VALVE REPLACEMENT    . COLON SURGERY    . COLONOSCOPY WITH PROPOFOL N/A 04/09/2019   Procedure: COLONOSCOPY WITH PROPOFOL;  Surgeon: Pasty Spillers, MD;  Location: [facility] ENDOSCOPY;   Service: Endoscopy;  Laterality: N/A;  . CORONARY ANGIOPLASTY WITH STENT PLACEMENT  2008   [place]; [facility] Stent placement x 1   . ESOPHAGOGASTRODUODENOSCOPY (EGD) WITH PROPOFOL N/A 04/09/2019   Procedure: ESOPHAGOGASTRODUODENOSCOPY (EGD) WITH PROPOFOL;  Surgeon: Pasty Spillers, MD;  Location: [facility] ENDOSCOPY;  Service: Endoscopy;  Laterality: N/A;  . EXCISIONAL HEMORRHOIDECTOMY    . EYE SURGERY    . HERNIA REPAIR      No Known Allergies  Prior to Admission medications   Medication Sig Start Date End Date Taking? Authorizing Provider  ALPRAZolam Prudy Feeler) 0.5 MG tablet Take 1 tablet (0.5 mg total) by mouth daily as needed for anxiety. 04/22/19  Yes Johnson, Megan P, DO  aspirin EC 325 MG tablet Take 325 mg by mouth daily.    Yes [provider]  atorvastatin (LIPITOR) 40 MG tablet Take 40 mg by mouth daily.   Yes [provider]  citalopram (CELEXA) 10 MG tablet Take 1 tablet (10 mg total) by mouth 2 (two) times daily. 05/10/19  Yes Johnson, Megan P, DO  cyanocobalamin (,VITAMIN B-12,) 1000 MCG/ML injection Inject 1 mL (1,000 mcg total) into the muscle every 30 (thirty) days. 02/08/19  Yes Johnson, Megan P, DO  diclofenac sodium (VOLTAREN) 1 % GEL Apply 4 g topically 4 (four) times daily. Patient taking differently: Apply 4 g topically 3 (three) times daily as needed (pain).  04/22/19  Yes Johnson, Megan P, DO  famotidine (PEPCID) 20 MG  tablet Take 1 tablet (20 mg total) by mouth at bedtime. Patient taking differently: Take 20 mg by mouth at bedtime as needed for heartburn or indigestion.  06/16/19  Yes Johnson, Megan P, DO  fenofibrate micronized (LOFIBRA) 200 MG capsule Take 1 capsule (200 mg total) by mouth daily. 02/05/19  Yes Johnson, Megan P, DO  hydrochlorothiazide (HYDRODIURIL) 25 MG tablet TAKE 1 TABLET(25 MG) BY MOUTH DAILY Patient taking differently: Take 25 mg by mouth daily.  03/19/19  Yes Johnson, Megan P, DO  HYDROcodone-acetaminophen  (NORCO/VICODIN) 5-325 MG tablet Take 1 tablet by mouth every 4 (four) hours as needed for moderate pain or severe pain. 07/12/19  Yes Piscoya, Jacqulyn Bath, MD  isosorbide mononitrate (IMDUR) 30 MG 24 hr tablet Take 60 mg (2 tablets) in the morning and 30mg  (1 tablet) in the evening. 07/13/19  Yes Loel Dubonnet, NP  lisinopril (ZESTRIL) 40 MG tablet Take 1 tablet (40 mg total) by mouth 2 (two) times daily. 04/22/19  Yes Johnson, Megan P, DO  loperamide (IMODIUM) 2 MG capsule Take 2 mg by mouth as needed for diarrhea or loose stools.   Yes [provider]  nitroGLYCERIN (NITROSTAT) 0.4 MG SL tablet Place 0.4 mg under the tongue every 5 (five) minutes as needed for chest pain.  10/20/18  Yes [provider]  nortriptyline (PAMELOR) 50 MG capsule Take 1 capsule (50 mg total) by mouth at bedtime. 06/16/19  Yes Johnson, Megan P, DO  omeprazole (PRILOSEC) 40 MG capsule TK ONE C PO BID Patient taking differently: Take 40 mg by mouth 2 (two) times daily.  02/05/19  Yes Johnson, Megan P, DO  ondansetron (ZOFRAN) 4 MG tablet TK 1 T PO  Q 6 H PRN Patient taking differently: Take 4 mg by mouth every 6 (six) hours as needed for nausea or vomiting.  02/05/19  Yes Johnson, Megan P, DO  amoxicillin-clavulanate (AUGMENTIN) 875-125 MG tablet Take 1 tablet by mouth 2 (two) times daily. Patient not taking: Reported on 07/22/2019 07/02/19   Park Liter P, DO  Insulin Syringe 27G X 1/2" 1 ML MISC 1 each by Does not apply route every 30 (thirty) days. 02/08/19   Johnson, Megan P, DO  lidocaine (LIDODERM) 5 % Place 2 patches onto the skin daily. Remove & Discard patch within 12 hours or as directed by MD Patient not taking: Reported on 07/22/2019 06/16/19   Park Liter P, DO  predniSONE (DELTASONE) 20 MG tablet Take 1 tablet (20 mg total) by mouth daily with breakfast. Patient not taking: Reported on 07/22/2019 07/13/19   Park Liter P, DO  pregabalin (LYRICA) 50 MG capsule 1 tab daily for 1 week, then  increase to 1 tab BID for week, then 2 tabs in the AM and 1 in the PM for 1 week, then 2 BID after that 07/13/19   Park Liter P, DO  rosuvastatin (CRESTOR) 10 MG tablet TAKE 1 TABLET(10 MG) BY MOUTH DAILY Patient not taking: Reported on 07/13/2019 07/13/19   Valerie Roys, DO    Social History   Socioeconomic History  . Marital status: Married    Spouse name: Not on file  . Number of children: Not on file  . Years of education: Not on file  . Highest education level: Not on file  Occupational History  . Not on file  Tobacco Use  . Smoking status: Light Tobacco Smoker    Packs/day: 0.25    Years: 3.00    Pack years: 0.75  Types: Cigarettes  . Smokeless tobacco: Never Used  Substance and Sexual Activity  . Alcohol use: Never  . Drug use: Never  . Sexual activity: Not on file  Other Topics Concern  . Not on file  Social History Narrative  . Not on file   Social Determinants of Health   Financial Resource Strain:   . Difficulty of Paying Living Expenses: Not on file  Food Insecurity:   . Worried About Programme researcher, broadcasting/film/video in the Last Year: Not on file  . Ran Out of Food in the Last Year: Not on file  Transportation Needs:   . Lack of Transportation (Medical): Not on file  . Lack of Transportation (Non-Medical): Not on file  Physical Activity:   . Days of Exercise per Week: Not on file  . Minutes of Exercise per Session: Not on file  Stress:   . Feeling of Stress : Not on file  Social Connections:   . Frequency of Communication with Friends and Family: Not on file  . Frequency of Social Gatherings with Friends and Family: Not on file  . Attends Religious Services: Not on file  . Active Member of Clubs or Organizations: Not on file  . Attends Banker Meetings: Not on file  . Marital Status: Not on file  Intimate Partner Violence:   . Fear of Current or Ex-Partner: Not on file  . Emotionally Abused: Not on file  . Physically Abused: Not on file    . Sexually Abused: Not on file     Family History  Adopted: Yes  Problem Relation Age of Onset  . Breast cancer Mother 71    ROS: Otherwise negative unless mentioned in HPI  Physical Examination  Vitals:   07/28/19 1419 07/28/19 1419  BP:  (!) 181/89  Pulse: 91 73  Resp: 13 (!) 22  Temp:    SpO2: (!) 0% (!) 0%   Body mass index is 29.87 kg/m.  General:  WDWN in NAD Gait: Not observed HENT: WNL, normocephalic Pulmonary: normal non-labored breathing Cardiac: regular Abdomen:  soft, NT/ND, no masses Skin: without rashes Vascular Exam/Pulses: Symmetrical femoral pulses; no palpable right pedal pulses; palpable 2+ left anterior tibial artery pulse Extremities: Bluish discolored right fifth toe and toe base; no tissue loss left foot Musculoskeletal: no muscle wasting or atrophy  Neurologic: A&O X 3;  No focal weakness or paresthesias are detected; speech is fluent/normal Psychiatric:  The pt has Normal affect. Lymph:  Unremarkable  CBC    Component Value Date/Time   WBC 7.9 07/27/2019 1117   RBC 5.19 (H) 07/27/2019 1117   HGB 14.9 07/27/2019 1117   HGB 15.4 07/05/2019 0817   HCT 45.9 07/27/2019 1117   HCT 47.2 (H) 07/05/2019 0817   PLT 161 07/27/2019 1117   PLT 177 07/05/2019 0817   MCV 88.4 07/27/2019 1117   MCV 88 07/05/2019 0817   MCH 28.7 07/27/2019 1117   MCHC 32.5 07/27/2019 1117   RDW 13.9 07/27/2019 1117   RDW 14.1 07/05/2019 0817   LYMPHSABS 2.0 07/27/2019 1117   LYMPHSABS 2.4 07/05/2019 0817   MONOABS 0.5 07/27/2019 1117   EOSABS 0.1 07/27/2019 1117   EOSABS 0.1 07/05/2019 0817   BASOSABS 0.0 07/27/2019 1117   BASOSABS 0.0 07/05/2019 0817    BMET    Component Value Date/Time   NA 141 07/27/2019 1117   NA 142 07/05/2019 0817   K 4.1 07/27/2019 1117   CL 105 07/27/2019 1117   CO2  27 07/27/2019 1117   GLUCOSE 113 (H) 07/27/2019 1117   BUN 25 (H) 07/27/2019 1117   BUN 25 07/05/2019 0817   CREATININE 0.74 07/27/2019 1117   CALCIUM 9.6  07/27/2019 1117   GFRNONAA >60 07/27/2019 1117   GFRAA >60 07/27/2019 1117    COAGS: No results found for: INR, PROTIME    ASSESSMENT/PLAN: This is a 74 y.o. female with critical limb ischemia right lower extremity involving ischemic tissue changes of right fifth toe and rest pain  -She is status post angiography by Dr. Kirke Corin demonstrating right SFA occlusion at the origin with reconstitution at the above the knee popliteal artery -Given rest pain and now tissue changes of right foot with the above findings, patient will be considered for right lower extremity bypass surgery -Per Dr.Arida she is cleared from a cardiac standpoint -We will obtain saphenous vein mapping -On-call vascular surgeon Dr. Arbie Cookey will evaluate the patient later today and provide further treatment plans   Emilie Rutter PA-C [facility] [phone]  I have examined the patient, reviewed and agree with above.  Several year history of progressive right calf claudication.  Now presents with 1 week history of cyanotic changes to her right fifth toe.  Rest pain associated with this.  Arteriogram reviewed.  This shows occlusion at the origin of her superficial femoral artery with reconstitution of above-knee popliteal artery which looks to be minimally diseased.  Two-vessel runoff via the anterior tibial and peroneal with reconstitution of the posterior tibial at the ankle.  Discussed options with the patient.  Recommend right femoral to above-knee popliteal bypass.  Will use saphenous vein if available and prosthetic if her vein is not adequate.  We will plan for her surgery on 08/02/2019  Gretta Began, MD 07/28/2019 3:41 PM

## 2019-07-28 NOTE — Consult Note (Addendum)
Hospital Consult    Reason for Consult:  RLE rest pain and ischemic tissue changes R 5th toe Requesting Physician:  Dr. Kirke Corin MRN #:  [id number]  History of Present Illness: This is a 74 y.o. female with past medical history significant for hyperlipidemia, hypertension, CAD with history of coronary stent as well as history of AVR with tissue valve, tobacco use, and PAD.  She underwent aortogram with bilateral lower extremity runoff by her cardiologist Dr. Kirke Corin this afternoon.  Angiography demonstrates long segment occlusion of right SFA starting just after origin and reconstituting in distal SFA with a normal appearing popliteal artery.  She complains of rest pain in her right shin and right foot over the past several months with severe and worsening claudication symptoms right worse than left calf.  Patient also complains of bluish discoloration of right fifth toe starting this past Monday, 07/26/2019.  Surgical history also significant for left common iliac artery stents.  She is a tobacco user down to about 3 cigarettes a day.  She is on aspirin and statin daily.  It should also be noted that left lower extremity runoff showed a diseased SFA but patent with single-vessel runoff via anterior tibial artery.  Past Medical History:  Diagnosis Date  . Anxiety   . Barrett's syndrome   . Depression   . Heart murmur   . Hyperlipidemia   . Hypertension   . Myocardial infarction [facility])    08    Past Surgical History:  Procedure Laterality Date  . ABDOMINAL HYSTERECTOMY    . ARTERY BIOPSY Right 07/12/2019   Procedure: BIOPSY TEMPORAL ARTERY;  Surgeon: Henrene Dodge, MD;  Location: [facility] ORS;  Service: General;  Laterality: Right;  . BREAST BIOPSY Left 2007   Neg- [place]  . CARDIAC CATHETERIZATION    . CARDIAC VALVE REPLACEMENT    . COLON SURGERY    . COLONOSCOPY WITH PROPOFOL N/A 04/09/2019   Procedure: COLONOSCOPY WITH PROPOFOL;  Surgeon: Pasty Spillers, MD;  Location: [facility] ENDOSCOPY;   Service: Endoscopy;  Laterality: N/A;  . CORONARY ANGIOPLASTY WITH STENT PLACEMENT  2008   [place]; [facility] Stent placement x 1   . ESOPHAGOGASTRODUODENOSCOPY (EGD) WITH PROPOFOL N/A 04/09/2019   Procedure: ESOPHAGOGASTRODUODENOSCOPY (EGD) WITH PROPOFOL;  Surgeon: Pasty Spillers, MD;  Location: [facility] ENDOSCOPY;  Service: Endoscopy;  Laterality: N/A;  . EXCISIONAL HEMORRHOIDECTOMY    . EYE SURGERY    . HERNIA REPAIR      No Known Allergies  Prior to Admission medications   Medication Sig Start Date End Date Taking? Authorizing Provider  ALPRAZolam Prudy Feeler) 0.5 MG tablet Take 1 tablet (0.5 mg total) by mouth daily as needed for anxiety. 04/22/19  Yes Johnson, Megan P, DO  aspirin EC 325 MG tablet Take 325 mg by mouth daily.    Yes [provider]  atorvastatin (LIPITOR) 40 MG tablet Take 40 mg by mouth daily.   Yes [provider]  citalopram (CELEXA) 10 MG tablet Take 1 tablet (10 mg total) by mouth 2 (two) times daily. 05/10/19  Yes Johnson, Megan P, DO  cyanocobalamin (,VITAMIN B-12,) 1000 MCG/ML injection Inject 1 mL (1,000 mcg total) into the muscle every 30 (thirty) days. 02/08/19  Yes Johnson, Megan P, DO  diclofenac sodium (VOLTAREN) 1 % GEL Apply 4 g topically 4 (four) times daily. Patient taking differently: Apply 4 g topically 3 (three) times daily as needed (pain).  04/22/19  Yes Johnson, Megan P, DO  famotidine (PEPCID) 20 MG  tablet Take 1 tablet (20 mg total) by mouth at bedtime. Patient taking differently: Take 20 mg by mouth at bedtime as needed for heartburn or indigestion.  06/16/19  Yes Johnson, Megan P, DO  fenofibrate micronized (LOFIBRA) 200 MG capsule Take 1 capsule (200 mg total) by mouth daily. 02/05/19  Yes Johnson, Megan P, DO  hydrochlorothiazide (HYDRODIURIL) 25 MG tablet TAKE 1 TABLET(25 MG) BY MOUTH DAILY Patient taking differently: Take 25 mg by mouth daily.  03/19/19  Yes Johnson, Megan P, DO  HYDROcodone-acetaminophen  (NORCO/VICODIN) 5-325 MG tablet Take 1 tablet by mouth every 4 (four) hours as needed for moderate pain or severe pain. 07/12/19  Yes Piscoya, Jacqulyn Bath, MD  isosorbide mononitrate (IMDUR) 30 MG 24 hr tablet Take 60 mg (2 tablets) in the morning and 30mg  (1 tablet) in the evening. 07/13/19  Yes Loel Dubonnet, NP  lisinopril (ZESTRIL) 40 MG tablet Take 1 tablet (40 mg total) by mouth 2 (two) times daily. 04/22/19  Yes Johnson, Megan P, DO  loperamide (IMODIUM) 2 MG capsule Take 2 mg by mouth as needed for diarrhea or loose stools.   Yes [provider]  nitroGLYCERIN (NITROSTAT) 0.4 MG SL tablet Place 0.4 mg under the tongue every 5 (five) minutes as needed for chest pain.  10/20/18  Yes [provider]  nortriptyline (PAMELOR) 50 MG capsule Take 1 capsule (50 mg total) by mouth at bedtime. 06/16/19  Yes Johnson, Megan P, DO  omeprazole (PRILOSEC) 40 MG capsule TK ONE C PO BID Patient taking differently: Take 40 mg by mouth 2 (two) times daily.  02/05/19  Yes Johnson, Megan P, DO  ondansetron (ZOFRAN) 4 MG tablet TK 1 T PO  Q 6 H PRN Patient taking differently: Take 4 mg by mouth every 6 (six) hours as needed for nausea or vomiting.  02/05/19  Yes Johnson, Megan P, DO  amoxicillin-clavulanate (AUGMENTIN) 875-125 MG tablet Take 1 tablet by mouth 2 (two) times daily. Patient not taking: Reported on 07/22/2019 07/02/19   Park Liter P, DO  Insulin Syringe 27G X 1/2" 1 ML MISC 1 each by Does not apply route every 30 (thirty) days. 02/08/19   Johnson, Megan P, DO  lidocaine (LIDODERM) 5 % Place 2 patches onto the skin daily. Remove & Discard patch within 12 hours or as directed by MD Patient not taking: Reported on 07/22/2019 06/16/19   Park Liter P, DO  predniSONE (DELTASONE) 20 MG tablet Take 1 tablet (20 mg total) by mouth daily with breakfast. Patient not taking: Reported on 07/22/2019 07/13/19   Park Liter P, DO  pregabalin (LYRICA) 50 MG capsule 1 tab daily for 1 week, then  increase to 1 tab BID for week, then 2 tabs in the AM and 1 in the PM for 1 week, then 2 BID after that 07/13/19   Park Liter P, DO  rosuvastatin (CRESTOR) 10 MG tablet TAKE 1 TABLET(10 MG) BY MOUTH DAILY Patient not taking: Reported on 07/13/2019 07/13/19   Valerie Roys, DO    Social History   Socioeconomic History  . Marital status: Married    Spouse name: Not on file  . Number of children: Not on file  . Years of education: Not on file  . Highest education level: Not on file  Occupational History  . Not on file  Tobacco Use  . Smoking status: Light Tobacco Smoker    Packs/day: 0.25    Years: 3.00    Pack years: 0.75  Types: Cigarettes  . Smokeless tobacco: Never Used  Substance and Sexual Activity  . Alcohol use: Never  . Drug use: Never  . Sexual activity: Not on file  Other Topics Concern  . Not on file  Social History Narrative  . Not on file   Social Determinants of Health   Financial Resource Strain:   . Difficulty of Paying Living Expenses: Not on file  Food Insecurity:   . Worried About Programme researcher, broadcasting/film/video in the Last Year: Not on file  . Ran Out of Food in the Last Year: Not on file  Transportation Needs:   . Lack of Transportation (Medical): Not on file  . Lack of Transportation (Non-Medical): Not on file  Physical Activity:   . Days of Exercise per Week: Not on file  . Minutes of Exercise per Session: Not on file  Stress:   . Feeling of Stress : Not on file  Social Connections:   . Frequency of Communication with Friends and Family: Not on file  . Frequency of Social Gatherings with Friends and Family: Not on file  . Attends Religious Services: Not on file  . Active Member of Clubs or Organizations: Not on file  . Attends Banker Meetings: Not on file  . Marital Status: Not on file  Intimate Partner Violence:   . Fear of Current or Ex-Partner: Not on file  . Emotionally Abused: Not on file  . Physically Abused: Not on file    . Sexually Abused: Not on file     Family History  Adopted: Yes  Problem Relation Age of Onset  . Breast cancer Mother 71    ROS: Otherwise negative unless mentioned in HPI  Physical Examination  Vitals:   07/28/19 1419 07/28/19 1419  BP:  (!) 181/89  Pulse: 91 73  Resp: 13 (!) 22  Temp:    SpO2: (!) 0% (!) 0%   Body mass index is 29.87 kg/m.  General:  WDWN in NAD Gait: Not observed HENT: WNL, normocephalic Pulmonary: normal non-labored breathing Cardiac: regular Abdomen:  soft, NT/ND, no masses Skin: without rashes Vascular Exam/Pulses: Symmetrical femoral pulses; no palpable right pedal pulses; palpable 2+ left anterior tibial artery pulse Extremities: Bluish discolored right fifth toe and toe base; no tissue loss left foot Musculoskeletal: no muscle wasting or atrophy  Neurologic: A&O X 3;  No focal weakness or paresthesias are detected; speech is fluent/normal Psychiatric:  The pt has Normal affect. Lymph:  Unremarkable  CBC    Component Value Date/Time   WBC 7.9 07/27/2019 1117   RBC 5.19 (H) 07/27/2019 1117   HGB 14.9 07/27/2019 1117   HGB 15.4 07/05/2019 0817   HCT 45.9 07/27/2019 1117   HCT 47.2 (H) 07/05/2019 0817   PLT 161 07/27/2019 1117   PLT 177 07/05/2019 0817   MCV 88.4 07/27/2019 1117   MCV 88 07/05/2019 0817   MCH 28.7 07/27/2019 1117   MCHC 32.5 07/27/2019 1117   RDW 13.9 07/27/2019 1117   RDW 14.1 07/05/2019 0817   LYMPHSABS 2.0 07/27/2019 1117   LYMPHSABS 2.4 07/05/2019 0817   MONOABS 0.5 07/27/2019 1117   EOSABS 0.1 07/27/2019 1117   EOSABS 0.1 07/05/2019 0817   BASOSABS 0.0 07/27/2019 1117   BASOSABS 0.0 07/05/2019 0817    BMET    Component Value Date/Time   NA 141 07/27/2019 1117   NA 142 07/05/2019 0817   K 4.1 07/27/2019 1117   CL 105 07/27/2019 1117   CO2  27 07/27/2019 1117   GLUCOSE 113 (H) 07/27/2019 1117   BUN 25 (H) 07/27/2019 1117   BUN 25 07/05/2019 0817   CREATININE 0.74 07/27/2019 1117   CALCIUM 9.6  07/27/2019 1117   GFRNONAA >60 07/27/2019 1117   GFRAA >60 07/27/2019 1117    COAGS: No results found for: INR, PROTIME    ASSESSMENT/PLAN: This is a 73 y.o. female with critical limb ischemia right lower extremity involving ischemic tissue changes of right fifth toe and rest pain  -She is status post angiography by Dr. Arida demonstrating right SFA occlusion at the origin with reconstitution at the above the knee popliteal artery -Given rest pain and now tissue changes of right foot with the above findings, patient will be considered for right lower extremity bypass surgery -Per Dr.Arida she is cleared from a cardiac standpoint -We will obtain saphenous vein mapping -On-call vascular surgeon Dr. Jonhatan Hearty will evaluate the patient later today and provide further treatment plans   Matthew Eveland PA-C [facility] [phone]  I have examined the patient, reviewed and agree with above.  Several year history of progressive right calf claudication.  Now presents with 1 week history of cyanotic changes to her right fifth toe.  Rest pain associated with this.  Arteriogram reviewed.  This shows occlusion at the origin of her superficial femoral artery with reconstitution of above-knee popliteal artery which looks to be minimally diseased.  Two-vessel runoff via the anterior tibial and peroneal with reconstitution of the posterior tibial at the ankle.  Discussed options with the patient.  Recommend right femoral to above-knee popliteal bypass.  Will use saphenous vein if available and prosthetic if her vein is not adequate.  We will plan for her surgery on 08/02/2019  Suann Klier, MD 07/28/2019 3:41 PM   

## 2019-07-28 NOTE — Telephone Encounter (Signed)
Call received from [place] at the [facility]. The patient showed up at [facility] instead of [facility] for her 12:30 pm PV procedure with Dr. Kirke Corin. Patient sts that she was not told the procedure was to be at [facility]. Both I and Ernestine Mcmurray, RN instructed the patient on the location of the procedure. Provide the physical address to [facility] and were she needs to report. Patient ask to first reschedule the appt at [facility]. Katina Dung that the procedure can only be done at [facility]. Patient sts that her toe is blue and she is wanting to have the procedure asap. Advised the patient to make her way to [facility] now. Patient is calling her ride back to transport her to [facility] asap.  Called the [facility] cath lab and spoke with [facility]. Katina Dung of the situation and that the patient will be delayed. Britta Mccreedy sts that the pt should get there asap.

## 2019-07-29 ENCOUNTER — Other Ambulatory Visit: Payer: Self-pay

## 2019-07-29 ENCOUNTER — Other Ambulatory Visit
Admission: RE | Admit: 2019-07-29 | Discharge: 2019-07-29 | Disposition: A | Discharge status: Home / Self Care | Payer: Federal, State, Local not specified - PPO | Source: Ambulatory Visit | Attending: Vascular Surgery | Admitting: Vascular Surgery

## 2019-07-29 ENCOUNTER — Telehealth: Payer: Self-pay

## 2019-07-29 DIAGNOSIS — Z20822 Contact with and (suspected) exposure to covid-19: Secondary | ICD-10-CM | POA: Diagnosis not present

## 2019-07-29 DIAGNOSIS — Z01812 Encounter for preprocedural laboratory examination: Secondary | ICD-10-CM | POA: Insufficient documentation

## 2019-07-29 NOTE — Telephone Encounter (Signed)
Returned pt's call-provided arrival time of 0830 for her scheduled surgery on Monday. Pt is aware she is NPO after midnight. No further questions/concerns at this time.

## 2019-07-30 ENCOUNTER — Other Ambulatory Visit: Payer: Federal, State, Local not specified - PPO

## 2019-07-30 ENCOUNTER — Telehealth: Payer: Self-pay

## 2019-07-30 LAB — SARS CORONAVIRUS 2 (TAT 6-24 HRS): SARS Coronavirus 2: NEGATIVE

## 2019-07-30 NOTE — Progress Notes (Signed)
Anesthesia Chart Review: Same day workup  Follows with cardiologist Dr. 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  She had cardiac work-up at [facility] in [place] in March.  Echocardiogram 09/2018 with normal LVEF she had a Lexiscan with no evidence of ischemia. She also recently had a temporal artery biopsy 07/12/19 that was negative.   Pt has severe PAD with rest pain and ischemic tissue changes. Angiography by Dr. Kirke Corin 07/27/18 demonstrated right SFA occlusion at the origin with reconstitution at the above the knee popliteal artery, vascular surgery was consulted based on result  Per vascular surgery consult note by Dr. Arbie Cookey: "-Given rest pain and now tissue changes of right foot with the above findings, patient will be considered for right lower extremity bypass surgery -Per Dr.Arida she is cleared from a cardiac standpoint"  BMP and CBC from 07/27/19 reviewed, WNL.  EKG 07/13/19: NSR. Rate 74. Possible LAE. LVH with repol abn. Possible inferior infarct, age undetermined.  Nuclear stress 10/19/18 (care everywhere): Summary:  1. Normal regadenoson myocardial perfusion with Tc-35m sestamibi imaging. 2. Normal LV perfusion. Global left ventricular systolic function was normal, with an EF of 74% with normal wall motion 3. Stress testing induced no chest pain symptoms and no ECG changes consistent with ischemia. There was a normal heart rate response, with a normal blood pressure response.  TTE 10/16/18 (care everywhere): Summary:  1. There is normal left ventricular systolic function. The ejection fraction is estimated to be 65%. Global left ventricular wall motion and contractility are within normal limits. 2. Abnormal left ventricular diastolic filling is observed, consistent with impaired LV relaxation(Stage I). 3. The right ventricular global systolic function is normal. 4. A bovine  bio-prosthetic aortic valve is present. The mean gradient across the aortic valve is .. Dimensionless index = ;  accleration time - 0.66 5. There is mild to moderate mitral annular calcification.  Zannie Cove [facility] Short Stay Center/Anesthesiology Phone [phone] 07/30/2019 11:35 AM

## 2019-07-30 NOTE — Anesthesia Preprocedure Evaluation (Addendum)
Anesthesia Evaluation  Patient identified by MRN, date of birth, ID band Patient awake    Reviewed: Allergy & Precautions, NPO status , Patient's Chart, lab work & pertinent test results  History of Anesthesia Complications Negative for: history of anesthetic complications  Airway Mallampati: I  TM Distance: >3 FB Neck ROM: Full    Dental  (+) Dental Advisory Given, Upper Dentures, Lower Dentures   Pulmonary Current Smoker and Patient abstained from smoking.,    Pulmonary exam normal        Cardiovascular hypertension, Pt. on medications + Past MI, + Cardiac Stents and + Peripheral Vascular Disease  Normal cardiovascular exam+ Valvular Problems/Murmurs (s/p AVR)    '20 Nuclear stress (care everywhere):  Normal regadenoson myocardial perfusion with Tc-37m sestamibi imaging. Normal LV perfusion. Global left ventricular systolic function was normal, with an EF of 74% with normal wall motion. Stress testing induced no chest pain symptoms and no ECG changes consistent with ischemia. There was a normal heart rate response, with a normal blood pressure response.  '20 TTE (care everywhere): EF 65%. Abnormal left ventricular diastolic filling is observed, consistent with impaired LV relaxation(Stage I). A bovine bio-prosthetic aortic valve is present. The mean gradient across the aortic valve is .    Neuro/Psych PSYCHIATRIC DISORDERS Anxiety Depression negative neurological ROS     GI/Hepatic Neg liver ROS, GERD  Medicated, IBS    Endo/Other  negative endocrine ROS  Renal/GU negative Renal ROS     Musculoskeletal  (+) Arthritis ,   Abdominal   Peds  Hematology negative hematology ROS (+)   Anesthesia Other Findings Covid neg 1/7   Reproductive/Obstetrics                           Anesthesia Physical Anesthesia Plan  ASA: III  Anesthesia Plan: General   Post-op Pain Management:     Induction: Intravenous  PONV Risk Score and Plan: 4 or greater and Treatment may vary due to age or medical condition, Ondansetron and Dexamethasone  Airway Management Planned: Oral ETT  Additional Equipment: Arterial line  Intra-op Plan:   Post-operative Plan: Extubation in OR  Informed Consent: I have reviewed the patients History and Physical, chart, labs and discussed the procedure including the risks, benefits and alternatives for the proposed anesthesia with the patient or authorized representative who has indicated his/her understanding and acceptance.     Dental advisory given  Plan Discussed with: CRNA and Anesthesiologist  Anesthesia Plan Comments: (2 peripheral IVs in addition to a-line )      Anesthesia Quick Evaluation

## 2019-07-30 NOTE — Telephone Encounter (Signed)
Returned pts call and left her a VM to call us back if she still needs [place].

## 2019-07-30 NOTE — Progress Notes (Signed)
Pt denies SOB and chest pain. Pt stated that she was under the care of Dr. Kirke Corin, Cardiology and Olevia Perches, PCP. Pt denies having a chest x ray in the last year. Pt made aware to stop taking  vitamins, fish oil and herbal medications. Do not take any NSAIDs ie: Ibuprofen, Advil, Naproxen (Aleve), Motrin, BC and Goody Powder. Pt verbalized understanding of all pre-op instructions. PA, Anesthesiology, asked to review pt cardiac history.

## 2019-07-31 ENCOUNTER — Other Ambulatory Visit: Payer: Self-pay | Admitting: Family Medicine

## 2019-07-31 NOTE — Telephone Encounter (Signed)
Requested medication (s) are due for refill today: no  Requested medication (s) are on the active medication list: no  Last refill:  07/13/2019  Future visit scheduled: no   Notes to clinic: This refill cannot be delegated  Looks like medication was discontinued    Requested Prescriptions  Pending Prescriptions Disp Refills   predniSONE (DELTASONE) 20 MG tablet [Pharmacy Med Name: PREDNISONE 20MG  TABLETS] 20 tablet 0    Sig: TAKE 1 TABLET(20 MG) BY MOUTH DAILY WITH BREAKFAST      Not Delegated - Endocrinology:  Oral Corticosteroids Failed - 07/31/2019  9:08 AM      Failed - This refill cannot be delegated      Failed - Last BP in normal range    BP Readings from Last 1 Encounters:  07/28/19 (!) 143/62          Passed - Valid encounter within last 6 months    Recent Outpatient Visits           2 weeks ago Temporal pain   [facility] [place], Megan P, DO   4 weeks ago Temporal pain   [facility] Milner, Megan P, DO   1 month ago Other intervertebral disc degeneration, thoracic region   [facility] [place], Megan P, DO   2 months ago Breast pain, right   [facility], Laurium, DO   3 months ago Essential hypertension   [facility] Vin[place], Hedrick, DO

## 2019-08-02 ENCOUNTER — Inpatient Hospital Stay (HOSPITAL_COMMUNITY): Payer: Medicare Other | Admitting: Physician Assistant

## 2019-08-02 ENCOUNTER — Encounter (HOSPITAL_COMMUNITY)
Admission: RE | Disposition: A | Discharge status: Home Health | Payer: Self-pay | Source: Home / Self Care | Attending: Vascular Surgery

## 2019-08-02 ENCOUNTER — Encounter (HOSPITAL_COMMUNITY): Payer: Self-pay | Admitting: Vascular Surgery

## 2019-08-02 ENCOUNTER — Other Ambulatory Visit: Payer: Self-pay

## 2019-08-02 ENCOUNTER — Inpatient Hospital Stay (HOSPITAL_COMMUNITY)
Admission: RE | Admit: 2019-08-02 | Discharge: 2019-08-04 | DRG: 254 | Disposition: A | Discharge status: Home Health | Payer: Medicare Other | Attending: Vascular Surgery | Admitting: Vascular Surgery

## 2019-08-02 DIAGNOSIS — Z955 Presence of coronary angioplasty implant and graft: Secondary | ICD-10-CM | POA: Diagnosis not present

## 2019-08-02 DIAGNOSIS — Z952 Presence of prosthetic heart valve: Secondary | ICD-10-CM

## 2019-08-02 DIAGNOSIS — Z791 Long term (current) use of non-steroidal anti-inflammatories (NSAID): Secondary | ICD-10-CM | POA: Diagnosis not present

## 2019-08-02 DIAGNOSIS — I998 Other disorder of circulatory system: Secondary | ICD-10-CM | POA: Diagnosis not present

## 2019-08-02 DIAGNOSIS — Z7982 Long term (current) use of aspirin: Secondary | ICD-10-CM | POA: Diagnosis not present

## 2019-08-02 DIAGNOSIS — I10 Essential (primary) hypertension: Secondary | ICD-10-CM | POA: Diagnosis present

## 2019-08-02 DIAGNOSIS — M25561 Pain in right knee: Secondary | ICD-10-CM | POA: Diagnosis present

## 2019-08-02 DIAGNOSIS — Z803 Family history of malignant neoplasm of breast: Secondary | ICD-10-CM

## 2019-08-02 DIAGNOSIS — F419 Anxiety disorder, unspecified: Secondary | ICD-10-CM | POA: Diagnosis present

## 2019-08-02 DIAGNOSIS — I252 Old myocardial infarction: Secondary | ICD-10-CM | POA: Diagnosis not present

## 2019-08-02 DIAGNOSIS — F17210 Nicotine dependence, cigarettes, uncomplicated: Secondary | ICD-10-CM | POA: Diagnosis present

## 2019-08-02 DIAGNOSIS — K219 Gastro-esophageal reflux disease without esophagitis: Secondary | ICD-10-CM | POA: Diagnosis present

## 2019-08-02 DIAGNOSIS — Z90710 Acquired absence of both cervix and uterus: Secondary | ICD-10-CM

## 2019-08-02 DIAGNOSIS — I739 Peripheral vascular disease, unspecified: Secondary | ICD-10-CM | POA: Diagnosis present

## 2019-08-02 DIAGNOSIS — F329 Major depressive disorder, single episode, unspecified: Secondary | ICD-10-CM | POA: Diagnosis present

## 2019-08-02 DIAGNOSIS — I70221 Atherosclerosis of native arteries of extremities with rest pain, right leg: Principal | ICD-10-CM | POA: Diagnosis present

## 2019-08-02 DIAGNOSIS — Z79899 Other long term (current) drug therapy: Secondary | ICD-10-CM | POA: Diagnosis not present

## 2019-08-02 DIAGNOSIS — I2510 Atherosclerotic heart disease of native coronary artery without angina pectoris: Secondary | ICD-10-CM | POA: Diagnosis present

## 2019-08-02 DIAGNOSIS — E785 Hyperlipidemia, unspecified: Secondary | ICD-10-CM | POA: Diagnosis present

## 2019-08-02 HISTORY — PX: FEMORAL-POPLITEAL BYPASS GRAFT: SHX937

## 2019-08-02 HISTORY — DX: Gastro-esophageal reflux disease without esophagitis: K21.9

## 2019-08-02 HISTORY — DX: Peripheral vascular disease, unspecified: I73.9

## 2019-08-02 HISTORY — DX: Unspecified osteoarthritis, unspecified site: M19.90

## 2019-08-02 LAB — URINALYSIS, ROUTINE W REFLEX MICROSCOPIC
Glucose, UA: NEGATIVE mg/dL
Hgb urine dipstick: NEGATIVE
Ketones, ur: NEGATIVE mg/dL
Leukocytes,Ua: NEGATIVE
Nitrite: NEGATIVE
Protein, ur: 100 mg/dL — AB
Specific Gravity, Urine: 1.026 (ref 1.005–1.030)
pH: 5 (ref 5.0–8.0)

## 2019-08-02 LAB — APTT: aPTT: 27 seconds (ref 24–36)

## 2019-08-02 LAB — PROTIME-INR
INR: 1 (ref 0.8–1.2)
Prothrombin Time: 12.7 seconds (ref 11.4–15.2)

## 2019-08-02 LAB — SURGICAL PCR SCREEN
MRSA, PCR: NEGATIVE
Staphylococcus aureus: NEGATIVE

## 2019-08-02 LAB — TYPE AND SCREEN
ABO/RH(D): A POS
Antibody Screen: NEGATIVE

## 2019-08-02 LAB — ABO/RH: ABO/RH(D): A POS

## 2019-08-02 LAB — GLUCOSE, CAPILLARY: Glucose-Capillary: 153 mg/dL — ABNORMAL HIGH (ref 70–99)

## 2019-08-02 SURGERY — BYPASS GRAFT FEMORAL-POPLITEAL ARTERY
Anesthesia: General | Site: Groin | Laterality: Right

## 2019-08-02 MED ORDER — ONDANSETRON HCL 4 MG/2ML IJ SOLN: 4.0000 mg | Freq: Four times a day (QID) | INTRAMUSCULAR | Status: DC | PRN

## 2019-08-02 MED ORDER — NITROGLYCERIN 0.4 MG SL SUBL: 0.4000 mg | SUBLINGUAL_TABLET | SUBLINGUAL | Status: DC | PRN

## 2019-08-02 MED ORDER — FENTANYL CITRATE (PF) 100 MCG/2ML IJ SOLN
25.0000 ug | INTRAMUSCULAR | Status: DC | PRN
  Administered 2019-08-02 (×2): 25 ug via INTRAVENOUS

## 2019-08-02 MED ORDER — FENTANYL CITRATE (PF) 100 MCG/2ML IJ SOLN
INTRAMUSCULAR | Status: AC
  Filled 2019-08-02: qty 2

## 2019-08-02 MED ORDER — SODIUM CHLORIDE 0.9 % IV SOLN
INTRAVENOUS | Status: DC | PRN
  Administered 2019-08-02: 500 mL

## 2019-08-02 MED ORDER — DICLOFENAC SODIUM 1 % TD GEL: 4.0000 g | Freq: Three times a day (TID) | TRANSDERMAL | Status: DC | PRN

## 2019-08-02 MED ORDER — METOPROLOL TARTRATE 5 MG/5ML IV SOLN: 2.0000 mg | INTRAVENOUS | Status: DC | PRN

## 2019-08-02 MED ORDER — CYANOCOBALAMIN 1000 MCG/ML IJ SOLN: 1000.0000 ug | INTRAMUSCULAR | Status: DC

## 2019-08-02 MED ORDER — HEPARIN SODIUM (PORCINE) 5000 UNIT/ML IJ SOLN
5000.0000 [IU] | Freq: Three times a day (TID) | INTRAMUSCULAR | Status: DC
  Administered 2019-08-03 – 2019-08-04 (×3): 5000 [IU] via SUBCUTANEOUS
  Filled 2019-08-02 (×3): qty 1

## 2019-08-02 MED ORDER — ACETAMINOPHEN 325 MG PO TABS: 325.0000 mg | ORAL_TABLET | ORAL | Status: DC | PRN

## 2019-08-02 MED ORDER — DEXAMETHASONE SODIUM PHOSPHATE 10 MG/ML IJ SOLN
INTRAMUSCULAR | Status: AC
  Filled 2019-08-02: qty 1

## 2019-08-02 MED ORDER — NORTRIPTYLINE HCL 25 MG PO CAPS: 50.0000 mg | ORAL_CAPSULE | Freq: Every day | ORAL | Status: DC

## 2019-08-02 MED ORDER — CHLORHEXIDINE GLUCONATE CLOTH 2 % EX PADS: 6.0000 | MEDICATED_PAD | Freq: Once | CUTANEOUS | Status: DC

## 2019-08-02 MED ORDER — SODIUM CHLORIDE 0.9 % IV SOLN: INTRAVENOUS | Status: DC

## 2019-08-02 MED ORDER — FENTANYL CITRATE (PF) 100 MCG/2ML IJ SOLN: 50.0000 ug | Freq: Once | INTRAMUSCULAR | Status: AC

## 2019-08-02 MED ORDER — PROTAMINE SULFATE 10 MG/ML IV SOLN
INTRAVENOUS | Status: AC
  Filled 2019-08-02: qty 5

## 2019-08-02 MED ORDER — MUPIROCIN 2 % EX OINT: 1.0000 "application " | TOPICAL_OINTMENT | Freq: Once | CUTANEOUS | Status: AC

## 2019-08-02 MED ORDER — ROCURONIUM BROMIDE 10 MG/ML (PF) SYRINGE
PREFILLED_SYRINGE | INTRAVENOUS | Status: AC
  Filled 2019-08-02: qty 10

## 2019-08-02 MED ORDER — LACTATED RINGERS IV SOLN: INTRAVENOUS | Status: DC | PRN

## 2019-08-02 MED ORDER — CEFAZOLIN SODIUM-DEXTROSE 2-4 GM/100ML-% IV SOLN
2.0000 g | Freq: Three times a day (TID) | INTRAVENOUS | Status: AC
  Administered 2019-08-02 – 2019-08-03 (×2): 2 g via INTRAVENOUS
  Filled 2019-08-02 (×2): qty 100

## 2019-08-02 MED ORDER — ONDANSETRON HCL 4 MG/2ML IJ SOLN
INTRAMUSCULAR | Status: AC
  Filled 2019-08-02: qty 2

## 2019-08-02 MED ORDER — POLYETHYLENE GLYCOL 3350 17 G PO PACK: 17.0000 g | PACK | Freq: Every day | ORAL | Status: DC | PRN

## 2019-08-02 MED ORDER — MIDAZOLAM HCL 2 MG/2ML IJ SOLN
INTRAMUSCULAR | Status: AC
  Filled 2019-08-02: qty 2

## 2019-08-02 MED ORDER — DEXAMETHASONE SODIUM PHOSPHATE 10 MG/ML IJ SOLN
INTRAMUSCULAR | Status: DC | PRN
  Administered 2019-08-02: 10 mg via INTRAVENOUS

## 2019-08-02 MED ORDER — ONDANSETRON HCL 4 MG/2ML IJ SOLN: 4.0000 mg | Freq: Once | INTRAMUSCULAR | Status: DC | PRN

## 2019-08-02 MED ORDER — PROPOFOL 10 MG/ML IV BOLUS
INTRAVENOUS | Status: DC | PRN
  Administered 2019-08-02: 90 mg via INTRAVENOUS
  Administered 2019-08-02: 40 mg via INTRAVENOUS

## 2019-08-02 MED ORDER — SUGAMMADEX SODIUM 200 MG/2ML IV SOLN
INTRAVENOUS | Status: DC | PRN
  Administered 2019-08-02: 150 mg via INTRAVENOUS

## 2019-08-02 MED ORDER — LACTATED RINGERS IV SOLN: INTRAVENOUS | Status: DC

## 2019-08-02 MED ORDER — ATORVASTATIN CALCIUM 40 MG PO TABS
40.0000 mg | ORAL_TABLET | Freq: Every day | ORAL | Status: DC
  Administered 2019-08-03: 40 mg via ORAL
  Filled 2019-08-02 (×2): qty 1

## 2019-08-02 MED ORDER — FENTANYL CITRATE (PF) 250 MCG/5ML IJ SOLN
INTRAMUSCULAR | Status: AC
  Filled 2019-08-02: qty 5

## 2019-08-02 MED ORDER — DOCUSATE SODIUM 100 MG PO CAPS
100.0000 mg | ORAL_CAPSULE | Freq: Every day | ORAL | Status: DC
  Administered 2019-08-03 – 2019-08-04 (×2): 100 mg via ORAL
  Filled 2019-08-02 (×2): qty 1

## 2019-08-02 MED ORDER — FENTANYL CITRATE (PF) 100 MCG/2ML IJ SOLN
INTRAMUSCULAR | Status: DC | PRN
  Administered 2019-08-02 (×5): 50 ug via INTRAVENOUS

## 2019-08-02 MED ORDER — MAGNESIUM SULFATE 2 GM/50ML IV SOLN: 2.0000 g | Freq: Every day | INTRAVENOUS | Status: DC | PRN

## 2019-08-02 MED ORDER — MIDAZOLAM HCL 5 MG/5ML IJ SOLN
INTRAMUSCULAR | Status: DC | PRN
  Administered 2019-08-02 (×2): 2 mg via INTRAVENOUS

## 2019-08-02 MED ORDER — FENTANYL CITRATE (PF) 100 MCG/2ML IJ SOLN
INTRAMUSCULAR | Status: AC
  Administered 2019-08-02: 50 ug via INTRAVENOUS
  Filled 2019-08-02: qty 2

## 2019-08-02 MED ORDER — SODIUM CHLORIDE 0.9 % IV SOLN
500.0000 mL | Freq: Once | INTRAVENOUS | Status: AC | PRN
  Administered 2019-08-02: 500 mL via INTRAVENOUS

## 2019-08-02 MED ORDER — LIDOCAINE 2% (20 MG/ML) 5 ML SYRINGE
INTRAMUSCULAR | Status: DC | PRN
  Administered 2019-08-02: 60 mg via INTRAVENOUS

## 2019-08-02 MED ORDER — LABETALOL HCL 5 MG/ML IV SOLN
10.0000 mg | INTRAVENOUS | Status: DC | PRN
  Filled 2019-08-02: qty 4

## 2019-08-02 MED ORDER — LOPERAMIDE HCL 2 MG PO CAPS: 2.0000 mg | ORAL_CAPSULE | ORAL | Status: DC | PRN

## 2019-08-02 MED ORDER — CITALOPRAM HYDROBROMIDE 20 MG PO TABS
10.0000 mg | ORAL_TABLET | Freq: Two times a day (BID) | ORAL | Status: DC
  Administered 2019-08-02 – 2019-08-04 (×4): 10 mg via ORAL
  Filled 2019-08-02 (×4): qty 1

## 2019-08-02 MED ORDER — 0.9 % SODIUM CHLORIDE (POUR BTL) OPTIME
TOPICAL | Status: DC | PRN
  Administered 2019-08-02: 2000 mL

## 2019-08-02 MED ORDER — ACETAMINOPHEN 325 MG RE SUPP: 325.0000 mg | RECTAL | Status: DC | PRN

## 2019-08-02 MED ORDER — CEFAZOLIN SODIUM-DEXTROSE 2-4 GM/100ML-% IV SOLN
INTRAVENOUS | Status: AC
  Filled 2019-08-02: qty 100

## 2019-08-02 MED ORDER — PHENOL 1.4 % MT LIQD: 1.0000 | OROMUCOSAL | Status: DC | PRN

## 2019-08-02 MED ORDER — ONDANSETRON HCL 4 MG PO TABS: 4.0000 mg | ORAL_TABLET | Freq: Four times a day (QID) | ORAL | Status: DC | PRN

## 2019-08-02 MED ORDER — LIDOCAINE 2% (20 MG/ML) 5 ML SYRINGE
INTRAMUSCULAR | Status: AC
  Filled 2019-08-02: qty 5

## 2019-08-02 MED ORDER — PHENYLEPHRINE HCL-NACL 10-0.9 MG/250ML-% IV SOLN
INTRAVENOUS | Status: DC | PRN
  Administered 2019-08-02: 20 ug/min via INTRAVENOUS

## 2019-08-02 MED ORDER — PROPOFOL 10 MG/ML IV BOLUS
INTRAVENOUS | Status: AC
  Filled 2019-08-02: qty 20

## 2019-08-02 MED ORDER — HYDRALAZINE HCL 20 MG/ML IJ SOLN: 5.0000 mg | INTRAMUSCULAR | Status: DC | PRN

## 2019-08-02 MED ORDER — BISACODYL 10 MG RE SUPP: 10.0000 mg | Freq: Every day | RECTAL | Status: DC | PRN

## 2019-08-02 MED ORDER — HYDROCHLOROTHIAZIDE 25 MG PO TABS
25.0000 mg | ORAL_TABLET | Freq: Every day | ORAL | Status: DC
  Administered 2019-08-03 – 2019-08-04 (×2): 25 mg via ORAL
  Filled 2019-08-02 (×2): qty 1

## 2019-08-02 MED ORDER — HEPARIN SODIUM (PORCINE) 1000 UNIT/ML IJ SOLN
INTRAMUSCULAR | Status: AC
  Filled 2019-08-02: qty 1

## 2019-08-02 MED ORDER — ONDANSETRON HCL 4 MG/2ML IJ SOLN
INTRAMUSCULAR | Status: DC | PRN
  Administered 2019-08-02: 4 mg via INTRAVENOUS

## 2019-08-02 MED ORDER — CEFAZOLIN SODIUM-DEXTROSE 2-4 GM/100ML-% IV SOLN
2.0000 g | INTRAVENOUS | Status: AC
  Administered 2019-08-02: 2 g via INTRAVENOUS

## 2019-08-02 MED ORDER — ISOSORBIDE MONONITRATE ER 60 MG PO TB24
60.0000 mg | ORAL_TABLET | Freq: Every day | ORAL | Status: DC
  Administered 2019-08-03 – 2019-08-04 (×2): 60 mg via ORAL
  Filled 2019-08-02 (×2): qty 1

## 2019-08-02 MED ORDER — POTASSIUM CHLORIDE CRYS ER 20 MEQ PO TBCR: 20.0000 meq | EXTENDED_RELEASE_TABLET | Freq: Every day | ORAL | Status: DC | PRN

## 2019-08-02 MED ORDER — ALPRAZOLAM 0.5 MG PO TABS: 0.5000 mg | ORAL_TABLET | Freq: Every day | ORAL | Status: DC | PRN

## 2019-08-02 MED ORDER — INSULIN ASPART 100 UNIT/ML ~~LOC~~ SOLN
0.0000 [IU] | Freq: Three times a day (TID) | SUBCUTANEOUS | Status: DC
  Administered 2019-08-03: 3 [IU] via SUBCUTANEOUS
  Administered 2019-08-03 (×2): 2 [IU] via SUBCUTANEOUS

## 2019-08-02 MED ORDER — ROCURONIUM BROMIDE 50 MG/5ML IV SOSY
PREFILLED_SYRINGE | INTRAVENOUS | Status: DC | PRN
  Administered 2019-08-02: 60 mg via INTRAVENOUS

## 2019-08-02 MED ORDER — PROTAMINE SULFATE 10 MG/ML IV SOLN
INTRAVENOUS | Status: DC | PRN
  Administered 2019-08-02: 50 mg via INTRAVENOUS

## 2019-08-02 MED ORDER — ISOSORBIDE MONONITRATE ER 30 MG PO TB24
30.0000 mg | ORAL_TABLET | Freq: Every evening | ORAL | Status: DC
  Administered 2019-08-03: 30 mg via ORAL
  Filled 2019-08-02: qty 1

## 2019-08-02 MED ORDER — SODIUM CHLORIDE 0.9 % IV SOLN
INTRAVENOUS | Status: AC
  Filled 2019-08-02: qty 1.2

## 2019-08-02 MED ORDER — HYDROCODONE-ACETAMINOPHEN 5-325 MG PO TABS
1.0000 | ORAL_TABLET | ORAL | Status: DC | PRN
  Administered 2019-08-03: 1 via ORAL
  Filled 2019-08-02 (×2): qty 1

## 2019-08-02 MED ORDER — PANTOPRAZOLE SODIUM 40 MG PO TBEC
40.0000 mg | DELAYED_RELEASE_TABLET | Freq: Two times a day (BID) | ORAL | Status: DC
  Administered 2019-08-02 – 2019-08-04 (×4): 40 mg via ORAL
  Filled 2019-08-02 (×4): qty 1

## 2019-08-02 MED ORDER — GUAIFENESIN-DM 100-10 MG/5ML PO SYRP: 15.0000 mL | ORAL_SOLUTION | ORAL | Status: DC | PRN

## 2019-08-02 MED ORDER — MUPIROCIN 2 % EX OINT
TOPICAL_OINTMENT | CUTANEOUS | Status: AC
  Administered 2019-08-02: 1 via TOPICAL
  Filled 2019-08-02: qty 22

## 2019-08-02 MED ORDER — OXYCODONE HCL 5 MG/5ML PO SOLN: 5.0000 mg | Freq: Once | ORAL | Status: DC | PRN

## 2019-08-02 MED ORDER — LISINOPRIL 40 MG PO TABS
40.0000 mg | ORAL_TABLET | Freq: Two times a day (BID) | ORAL | Status: DC
  Administered 2019-08-03 – 2019-08-04 (×3): 40 mg via ORAL
  Filled 2019-08-02 (×3): qty 1

## 2019-08-02 MED ORDER — HEPARIN SODIUM (PORCINE) 1000 UNIT/ML IJ SOLN
INTRAMUSCULAR | Status: DC | PRN
  Administered 2019-08-02: 8000 [IU] via INTRAVENOUS

## 2019-08-02 MED ORDER — AMLODIPINE BESYLATE 10 MG PO TABS
10.0000 mg | ORAL_TABLET | Freq: Every day | ORAL | Status: DC
  Administered 2019-08-03 – 2019-08-04 (×2): 10 mg via ORAL
  Filled 2019-08-02 (×2): qty 1

## 2019-08-02 MED ORDER — FAMOTIDINE 20 MG PO TABS: 20.0000 mg | ORAL_TABLET | Freq: Every evening | ORAL | Status: DC | PRN

## 2019-08-02 MED ORDER — ASPIRIN EC 325 MG PO TBEC
325.0000 mg | DELAYED_RELEASE_TABLET | Freq: Every day | ORAL | Status: DC
  Administered 2019-08-03 – 2019-08-04 (×2): 325 mg via ORAL
  Filled 2019-08-02 (×2): qty 1

## 2019-08-02 MED ORDER — PHENYLEPHRINE 40 MCG/ML (10ML) SYRINGE FOR IV PUSH (FOR BLOOD PRESSURE SUPPORT)
PREFILLED_SYRINGE | INTRAVENOUS | Status: DC | PRN
  Administered 2019-08-02: 40 ug via INTRAVENOUS
  Administered 2019-08-02: 120 ug via INTRAVENOUS
  Administered 2019-08-02: 40 ug via INTRAVENOUS

## 2019-08-02 MED ORDER — ALUM & MAG HYDROXIDE-SIMETH 200-200-20 MG/5ML PO SUSP: 15.0000 mL | ORAL | Status: DC | PRN

## 2019-08-02 MED ORDER — OXYCODONE HCL 5 MG PO TABS: 5.0000 mg | ORAL_TABLET | Freq: Once | ORAL | Status: DC | PRN

## 2019-08-02 MED ORDER — MORPHINE SULFATE (PF) 2 MG/ML IV SOLN
2.0000 mg | INTRAVENOUS | Status: DC | PRN
  Administered 2019-08-02 – 2019-08-03 (×7): 2 mg via INTRAVENOUS
  Filled 2019-08-02 (×7): qty 1

## 2019-08-02 SURGICAL SUPPLY — 52 items
BANDAGE ESMARK 6X9 LF (GAUZE/BANDAGES/DRESSINGS) IMPLANT
BNDG ESMARK 6X9 LF (GAUZE/BANDAGES/DRESSINGS)
CANISTER SUCT 3000ML PPV (MISCELLANEOUS) ×2 IMPLANT
CANNULA VESSEL 3MM 2 BLNT TIP (CANNULA) ×4 IMPLANT
CLIP LIGATING EXTRA MED SLVR (CLIP) ×2 IMPLANT
CLIP LIGATING EXTRA SM BLUE (MISCELLANEOUS) ×2 IMPLANT
COVER PROBE W GEL 5X96 (DRAPES) ×2 IMPLANT
COVER WAND RF STERILE (DRAPES) IMPLANT
CUFF TOURN SGL QUICK 34 (TOURNIQUET CUFF)
CUFF TOURN SGL QUICK 42 (TOURNIQUET CUFF) IMPLANT
CUFF TRNQT CYL 34X4.125X (TOURNIQUET CUFF) IMPLANT
DERMABOND ADVANCED (GAUZE/BANDAGES/DRESSINGS) ×2
DERMABOND ADVANCED .7 DNX12 (GAUZE/BANDAGES/DRESSINGS) ×2 IMPLANT
DRAIN SNY 10X20 3/4 PERF (WOUND CARE) IMPLANT
DRAPE HALF SHEET 40X57 (DRAPES) IMPLANT
DRAPE X-RAY CASS 24X20 (DRAPES) IMPLANT
ELECT REM PT RETURN 9FT ADLT (ELECTROSURGICAL) ×2
ELECTRODE REM PT RTRN 9FT ADLT (ELECTROSURGICAL) ×1 IMPLANT
EVACUATOR SILICONE 100CC (DRAIN) IMPLANT
GLOVE BIO SURGEON STRL SZ 6 (GLOVE) ×2 IMPLANT
GLOVE BIO SURGEON STRL SZ 6.5 (GLOVE) ×4 IMPLANT
GLOVE BIOGEL PI IND STRL 6.5 (GLOVE) ×1 IMPLANT
GLOVE BIOGEL PI IND STRL 7.0 (GLOVE) ×2 IMPLANT
GLOVE BIOGEL PI INDICATOR 6.5 (GLOVE) ×1
GLOVE BIOGEL PI INDICATOR 7.0 (GLOVE) ×2
GLOVE SS BIOGEL STRL SZ 7.5 (GLOVE) ×1 IMPLANT
GLOVE SUPERSENSE BIOGEL SZ 7.5 (GLOVE) ×1
GOWN STRL REUS W/ TWL LRG LVL3 (GOWN DISPOSABLE) ×3 IMPLANT
GOWN STRL REUS W/TWL LRG LVL3 (GOWN DISPOSABLE) ×3
GRAFT PROPATEN THIN WALL 6X80 (Vascular Products) ×2 IMPLANT
INSERT FOGARTY SM (MISCELLANEOUS) IMPLANT
KIT BASIN OR (CUSTOM PROCEDURE TRAY) ×2 IMPLANT
KIT TURNOVER KIT B (KITS) ×2 IMPLANT
NS IRRIG 1000ML POUR BTL (IV SOLUTION) ×4 IMPLANT
PACK PERIPHERAL VASCULAR (CUSTOM PROCEDURE TRAY) ×2 IMPLANT
PAD ARMBOARD 7.5X6 YLW CONV (MISCELLANEOUS) ×4 IMPLANT
PADDING CAST COTTON 6X4 STRL (CAST SUPPLIES) IMPLANT
SET COLLECT BLD 21X3/4 12 (NEEDLE) IMPLANT
STOPCOCK 4 WAY LG BORE MALE ST (IV SETS) IMPLANT
SUT ETHILON 3 0 PS 1 (SUTURE) IMPLANT
SUT PROLENE 5 0 C 1 24 (SUTURE) ×2 IMPLANT
SUT PROLENE 6 0 CC (SUTURE) ×6 IMPLANT
SUT SILK 2 0 SH (SUTURE) ×2 IMPLANT
SUT VIC AB 2-0 CTX 36 (SUTURE) ×4 IMPLANT
SUT VIC AB 3-0 SH 27 (SUTURE) ×2
SUT VIC AB 3-0 SH 27X BRD (SUTURE) ×2 IMPLANT
SUT VIC AB 4-0 PS2 27 (SUTURE) ×2 IMPLANT
TOWEL GREEN STERILE (TOWEL DISPOSABLE) ×2 IMPLANT
TRAY FOLEY MTR SLVR 16FR STAT (SET/KITS/TRAYS/PACK) ×2 IMPLANT
TUBING EXTENTION W/L.L. (IV SETS) IMPLANT
UNDERPAD 30X30 (UNDERPADS AND DIAPERS) ×2 IMPLANT
WATER STERILE IRR 1000ML POUR (IV SOLUTION) ×2 IMPLANT

## 2019-08-02 NOTE — Telephone Encounter (Signed)
Looks like patient is currently admitted.

## 2019-08-02 NOTE — Progress Notes (Signed)
Patient complaining of "right knee being locked up".  Repositioned patient and made Dr. Arbie Cookey aware.

## 2019-08-02 NOTE — Interval H&P Note (Signed)
History and Physical Interval Note:  08/02/2019 12:50 PM  Tara [name]  has presented today for surgery, with the diagnosis of RIGHT LOWER EXTREMITY REST PAIN.  The various methods of treatment have been discussed with the patient and family. After consideration of risks, benefits and other options for treatment, the patient has consented to  Procedure(s): BYPASS GRAFT FEMORAL-POPLITEAL ARTERY (Right) as a surgical intervention.  The patient's history has been reviewed, patient examined, no change in status, stable for surgery.  I have reviewed the patient's chart and labs.  Questions were answered to the patient's satisfaction.     Gretta Began

## 2019-08-02 NOTE — Discharge Instructions (Signed)
 [facility]  Discharge instructions  Lower Extremity Bypass Surgery  Please refer to the following instruction for your post-procedure care. Your surgeon or physician assistant will discuss any changes with you.  Activity  You are encouraged to walk as much as you can. You can slowly return to normal activities during the month after your surgery. Avoid strenuous activity and heavy lifting until your doctor tells you it's OK. Avoid activities such as vacuuming or swinging a golf club. Do not drive until your doctor give the OK and you are no longer taking prescription pain medications. It is also normal to have difficulty with sleep habits, eating and bowel movement after surgery. These will go away with time.  Bathing/Showering  Shower daily after you go home. Do not soak in a bathtub, hot tub, or swim until the incision heals completely.  Incision Care  Clean your incision with mild soap and water. Shower every day. Pat the area dry with a clean towel. You do not need a bandage unless otherwise instructed. Do not apply any ointments or creams to your incision. If you have open wounds you will be instructed how to care for them or a visiting nurse may be arranged for you. If you have staples or sutures along your incision they will be removed at your post-op appointment. You may have skin glue on your incision. Do not peel it off. It will come off on its own in about one week.  Wash the groin wound with soap and water daily and pat dry. (No tub bath-only shower)  Then put a dry gauze or washcloth in the groin to keep this area dry to help prevent wound infection.  Do this daily and as needed.  Do not use Vaseline or neosporin on your incisions.  Only use soap and water on your incisions and then protect and keep dry.  Diet  Resume your normal diet. There are no special food restrictions following this procedure. A low fat/ low cholesterol diet is  recommended for all patients with vascular disease. In order to heal from your surgery, it is CRITICAL to get adequate nutrition. Your body requires vitamins, minerals, and protein. Vegetables are the best source of vitamins and minerals. Vegetables also provide the perfect balance of protein. Processed food has little nutritional value, so try to avoid this.  Medications  Resume taking all your medications unless your doctor or physician assistant tells you not to. If your incision is causing pain, you may take over-the-counter pain relievers such as acetaminophen (Tylenol). If you were prescribed a stronger pain medication, please aware these medication can cause nausea and constipation. Prevent nausea by taking the medication with a snack or meal. Avoid constipation by drinking plenty of fluids and eating foods with high amount of fiber, such as fruits, vegetables, and grains. Take Colace 100 mg (an over-the-counter stool softener) twice a day as needed for constipation.  Do not take Tylenol if you are taking prescription pain medications.  Follow Up  Our office will schedule a follow up appointment 2-3 weeks following discharge.  Please call us immediately for any of the following conditions  Severe or worsening pain in your legs or feet while at rest or while walking Increase pain, redness, warmth, or drainage (pus) from your incision site(s) Fever of 101 degree or higher The swelling in your leg with the bypass suddenly worsens and becomes more painful than when you were in the hospital If you have   been instructed to feel your graft pulse then you should do so every day. If you can no longer feel this pulse, call the office immediately. Not all patients are given this instruction.  Leg swelling is common after leg bypass surgery.  The swelling should improve over a few months following surgery. To improve the swelling, you may elevate your legs above the level of your heart while you are  sitting or resting. Your surgeon or physician assistant may ask you to apply an ACE wrap or wear compression (TED) stockings to help to reduce swelling.  Reduce your risk of vascular disease  Stop smoking. If you would like help call [facility] at 1-800-QUIT-NOW (1-800-784-8669) or [place] at 336-586-4000.  Manage your cholesterol Maintain a desired weight Control your diabetes weight Control your diabetes Keep your blood pressure down  If you have any questions, please call the office at [phone]  

## 2019-08-02 NOTE — Op Note (Signed)
    OPERATIVE REPORT  DATE OF SURGERY: 08/02/2019  PATIENT: Tara [name], 74 y.o. female MRN: [id number]  DOB: 1946/01/16  PRE-OPERATIVE DIAGNOSIS: Critical limb ischemia of right foot  POST-OPERATIVE DIAGNOSIS:  Same  PROCEDURE: Right femoral to above-knee popliteal bypass with propatent Gore-Tex graft  SURGEON:  Gretta Began, M.D.  PHYSICIAN ASSISTANT: Darlin Coco, PA-C  ANESTHESIA: General  EBL: per anesthesia record  Total I/O In: 1500 [I.V.:1500] Out: -   BLOOD ADMINISTERED: none  DRAINS: none  SPECIMEN: none  COUNTS CORRECT:  YES  PATIENT DISPOSITION:  PACU - hemodynamically stable  PROCEDURE DETAILS: Patient was taken operating placed supine position where the area of the right groin right leg prepped draped in sterile fashion.  SonoSite ultrasound was used to visualize the saphenous vein.  The patient completely thrombosed her saphenous vein from proximal thigh to mid calf.  The vein above the thrombosed segment was sclerotic.  Decision was made to proceed with a femoral to above-knee popliteal bypass with propatent Gore-Tex graft.  Incision was made over the femoral pulse and carried down to isolate the common femoral artery.  The patient had a relatively high bifurcation of the profunda and the inguinal ligament was mobilized to allow encircling of the common femoral artery.  The superficial femoral artery was encircled with a blue vessel loop and was occluded at its origin.  The profundus femoris artery was of good size and was encircled with a vessel loop.  Next a separate incision was made in the medial approach to the above-knee popliteal artery.  The artery was of good caliber.  Tributary branches of vein were ligated to allow exposure.  A tunnel was created from the level of the above-knee popliteal artery to the groin.  The patient was given 8000 intravenous heparin.  After adequate circulation time the popliteal artery was occluded proximally distally was  opened 11 blade simultaneous Potts scissors.  The graft was brought into the field and spatulated and sewn end-to-side to the artery with a running 6-0 Prolene suture.  This anastomosis was tested and found to be adequate.  The graft was then tunneled to the level of the groin.  The common superficial femoral and profundus femoris arteries were occluded.  The common femoral artery was opened 11 blade sent longitudinally with Potts scissors.  The graft was placed into the appropriate tension and was spatulated and sewn end-to-side to the artery with a running 6-0 Prolene suture.  Clamps removed and excellent flow was noted to the foot.  The patient was given 50 mg of protamine reverse heparin.  The wounds irrigated with saline.  Hemostasis electrocautery.  The wounds were closed with 2-0 Vicryl in the fascia and the skin was closed with 301 4 subcuticular Vicryl suture.  Sterile dressing was applied and the patient was transferred to the recovery room in stable condition   Larina Earthly, M.D., [facility] 08/02/2019 4:19 PM

## 2019-08-02 NOTE — Transfer of Care (Signed)
Immediate Anesthesia Transfer of Care Note  Patient: Coleen Cardiff Swift  Procedure(s) Performed: BYPASS GRAFT FEMORAL-POPLITEAL ARTERY (Right Groin)  Patient Location: PACU  Anesthesia Type:General  Level of Consciousness: awake, alert , oriented and patient cooperative  Airway & Oxygen Therapy: Patient remains intubated per anesthesia plan and Patient placed on Ventilator (see vital sign flow sheet for setting)  Post-op Assessment: Report given to RN and Post -op Vital signs reviewed and stable  Post vital signs: Reviewed and stable  Last Vitals:  Vitals Value Taken Time  BP 131/52 08/02/19 1622  Temp 36.9 C 08/02/19 1622  Pulse 86 08/02/19 1627  Resp 29 08/02/19 1627  SpO2 93 % 08/02/19 1627  Vitals shown include unvalidated device data.  Last Pain:  Vitals:   08/02/19 1622  TempSrc:   PainSc: (P) 0-No pain      Patients Stated Pain Goal: 3 (08/02/19 1122)  Complications: No apparent anesthesia complications

## 2019-08-02 NOTE — Anesthesia Postprocedure Evaluation (Signed)
Anesthesia Post Note  Patient: Tara [name]  Procedure(s) Performed: BYPASS GRAFT FEMORAL-POPLITEAL ARTERY (Right Groin)     Patient location during evaluation: PACU Anesthesia Type: General Level of consciousness: awake and alert Pain management: pain level controlled Vital Signs Assessment: post-procedure vital signs reviewed and stable Respiratory status: spontaneous breathing, nonlabored ventilation, respiratory function stable and patient connected to nasal cannula oxygen Cardiovascular status: blood pressure returned to baseline and stable Postop Assessment: no apparent nausea or vomiting Anesthetic complications: no    Last Vitals:  Vitals:   08/02/19 1707 08/02/19 1723  BP: (!) 101/53 (!) 93/51  Pulse: 78 74  Resp: 12 11  Temp:    SpO2: 93% 94%    Last Pain:  Vitals:   08/02/19 1723  TempSrc:   PainSc: 3         RLE Motor Response: Purposeful movement;Responds to commands (08/02/19 1723) RLE Sensation: Full sensation (08/02/19 1723)      Beryle Lathe

## 2019-08-02 NOTE — Progress Notes (Signed)
Patient complaining of 9/10 pain in right leg.  Attempted to relieve pain by repositioning and massaging leg with no success  Dr. Mal Amabile notified of pain and gave verbal order to give 50 mcg Fentanyl.  Will continue to monitor patient.

## 2019-08-02 NOTE — Progress Notes (Signed)
Patient ID: Tara [name], female   DOB: [birth year], 74 y.o.   MRN: [id number] Awake and alert in PACU.  Minimal discomfort.\ 2-3+ right dorsalis pedis pulse.  Patient did report difficulty with her right knee preoperatively and reported that it was similar to sciatic difficulty she has had in the past.  I did explain to her that this would not be related to ischemia.  Currently does not appear to be experiencing this.  Will follow

## 2019-08-02 NOTE — Anesthesia Procedure Notes (Signed)
Arterial Line Insertion Start/End1/05/2020 10:24 AM Performed by: CRNA  Preanesthetic checklist: patient identified, IV checked, site marked, risks and benefits discussed, monitors and equipment checked and pre-op evaluation radial was placed Catheter size: 20 G Hand hygiene performed  and maximum sterile barriers used   Attempts: 1 Procedure performed without using ultrasound guided technique. Following insertion, Biopatch and dressing applied. Post procedure assessment: normal  Patient tolerated the procedure well with no immediate complications.

## 2019-08-03 ENCOUNTER — Inpatient Hospital Stay (HOSPITAL_COMMUNITY): Payer: Medicare Other

## 2019-08-03 ENCOUNTER — Encounter (HOSPITAL_COMMUNITY): Payer: Self-pay | Admitting: Vascular Surgery

## 2019-08-03 DIAGNOSIS — I739 Peripheral vascular disease, unspecified: Secondary | ICD-10-CM

## 2019-08-03 LAB — CBC
HCT: 40.6 % (ref 36.0–46.0)
Hemoglobin: 13.2 g/dL (ref 12.0–15.0)
MCH: 29.3 pg (ref 26.0–34.0)
MCHC: 32.5 g/dL (ref 30.0–36.0)
MCV: 90 fL (ref 80.0–100.0)
Platelets: 195 10*3/uL (ref 150–400)
RBC: 4.51 MIL/uL (ref 3.87–5.11)
RDW: 13.9 % (ref 11.5–15.5)
WBC: 9.4 10*3/uL (ref 4.0–10.5)
nRBC: 0 % (ref 0.0–0.2)

## 2019-08-03 LAB — BASIC METABOLIC PANEL WITH GFR
Anion gap: 8 (ref 5–15)
BUN: 19 mg/dL (ref 8–23)
CO2: 23 mmol/L (ref 22–32)
Calcium: 8.3 mg/dL — ABNORMAL LOW (ref 8.9–10.3)
Chloride: 109 mmol/L (ref 98–111)
Creatinine, Ser: 0.8 mg/dL (ref 0.44–1.00)
GFR calc Af Amer: >60 mL/min
GFR calc non Af Amer: >60 mL/min
Glucose, Bld: 163 mg/dL — ABNORMAL HIGH (ref 70–99)
Potassium: 3.9 mmol/L (ref 3.5–5.1)
Sodium: 140 mmol/L (ref 135–145)

## 2019-08-03 LAB — HEMOGLOBIN A1C
Hgb A1c MFr Bld: 6.3 % — ABNORMAL HIGH (ref 4.8–5.6)
Mean Plasma Glucose: 134.11 mg/dL

## 2019-08-03 LAB — GLUCOSE, CAPILLARY
Glucose-Capillary: 150 mg/dL — ABNORMAL HIGH (ref 70–99)
Glucose-Capillary: 124 mg/dL — ABNORMAL HIGH (ref 70–99)
Glucose-Capillary: 161 mg/dL — ABNORMAL HIGH (ref 70–99)

## 2019-08-03 MED ORDER — HYDROCODONE-ACETAMINOPHEN 5-325 MG PO TABS
1.0000 | ORAL_TABLET | ORAL | Status: DC | PRN
  Administered 2019-08-03 – 2019-08-04 (×5): 2 via ORAL
  Filled 2019-08-03 (×5): qty 2

## 2019-08-03 NOTE — Progress Notes (Signed)
ABI study completed.  Preliminary results can be found under CV proc under chart review.  08/03/2019 1:07 PM  Tara [name], K., RDMS, RVT

## 2019-08-03 NOTE — TOC Transition Note (Signed)
Transition of Care [facility]) - CM/SW Discharge Note Donn Pierini RN, BSN Transitions of Care Unit 4E- RN Case Manager [phone]   Patient Details  Name: Tara [name] MRN: [id number] Date of Birth: 08-23-1945  Transition of Care [facility]) CM/SW Contact:  Darrold Span, RN Phone Number: 08/03/2019, 5:36 PM   Clinical Narrative:    Orders placed for HHPT/OT, [facility] had received referral for [facility] needs however they are OON with pt's BCBS policy- CM spoke with pt at bedside confirmed BCBS policy is primary. List provided for [facility] choice Per CMS guidelines from medicare.gov website with star ratings (copy placed in shadow chart)- per pt she does not have a preference as long as agency takes her insurance- no DME needs identified pt states she has what she needs- call made to [facility] with [facility] for [facility] referral- referral has been accepted.    Final next level of care: Home w Home Health Services Barriers to Discharge: No Barriers Identified   Patient Goals and CMS Choice Patient states their goals for this hospitalization and ongoing recovery are:: return home with [facility] CMS Medicare.gov Compare Post Acute Care list provided to:: Patient Choice offered to / list presented to : Patient  Discharge Placement           Home with [facility]            Discharge Plan and Services   Discharge Planning Services: CM Consult Post Acute Care Choice: Home Health          DME Arranged: N/A DME Agency: NA       HH Arranged: PT, OT HH Agency: [facility] Date [facility] Agency Contacted: 08/03/19 Time HH Agency Contacted: [phone] Representative spoke with at [facility] Agency: Kandee Keen  Social Determinants of Health (SDOH) Interventions     Readmission Risk Interventions Readmission Risk Prevention Plan 08/03/2019  Post Dischage Appt Complete  Medication Screening Complete  Transportation Screening Complete

## 2019-08-03 NOTE — Progress Notes (Signed)
Pt states that she wants Dr. Arbie Cookey paged because she needs ortho to see her and get an MRI of her leg. Pt states that she can not go home until she has her leg fixed. Her husband is disabled and her daughter says it needs to be taken care of here before she goes home. I paged [facility] and made aware that patient has stated she needs to go, then stated needs her leg looked at, then stated does not want to wait to have looked at and now states that she can not go home until her leg is seen by someone. I explained that patient did not have discharge order and MD was waiting for PT evaluation and recommendation. Pt resting with call bell within reach.  Will continue to monitor.

## 2019-08-03 NOTE — Evaluation (Addendum)
Occupational Therapy Evaluation Patient Details Name: Tara [name] MRN: [id number] DOB: [birth year] Today's Date: 08/03/2019    History of Present Illness Pt is a 74 y.o. female s/p R femoral to above knee popliteal bypass 08/02/19. Pt with worsening RLE weakness and pain that she reports acute worsening of immediately before sx 1/11. PMH includes sciatica (RLE?LLE), PAD, HTN, depression, arthritis.   Clinical Impression   Pt admitted with above. She demonstrates the below listed deficits and will benefit from continued OT to maximize safety and independence with BADLs.  Pt requires min guard to max A for ADLs and min A for functional mobility. She is significantly limited by c/o pain and anxiety.  I am unsure of her home set up and level of assist as she refused to provide this info to me, but does indicate she needs to be able to perform LB ADL as independently as possible.  Anticipate she will require 24 hour assist initially to ensure safe progression into home environment, then tapering to intermittent assist.   She c/o significant pain Rt LE and inability to move Rt LE, however presentation is inconsistent.  Will follow acutely.       Follow Up Recommendations  Home health OT;Supervision/Assistance - 24 hour    Equipment Recommendations  Tub/shower bench    Recommendations for Other Services       Precautions / Restrictions Precautions Precautions: Fall Restrictions Weight Bearing Restrictions: No      Mobility Bed Mobility Overal bed mobility: Needs Assistance Bed Mobility: Sit to Supine       Sit to supine: Min assist   General bed mobility comments: Pt unable to lift Rt LE onto bed, but does hook her Rt LE.  Attempts to assist pt with Rt LE were initially rebuffed, then pt became angry that pt not assisting her adequately   Transfers Overall transfer level: Needs assistance Equipment used: Rolling walker (2 wheeled) Transfers: Sit to/from [employer] Sit to Stand: Min guard Stand pivot transfers: Min assist       General transfer comment: see comments under toilet transfer section     Balance Overall balance assessment: Mild deficits observed, not formally tested                                         ADL either performed or assessed with clinical judgement   ADL Overall ADL's : Needs assistance/impaired Eating/Feeding: Independent   Grooming: Wash/dry hands;Wash/dry face;Oral care;Brushing hair;Set up;Sitting   Upper Body Bathing: Set up;Sitting   Lower Body Bathing: Moderate assistance;Sit to/from stand   Upper Body Dressing : Set up;Sitting   Lower Body Dressing: Maximal assistance;Sit to/from stand Lower Body Dressing Details (indicate cue type and reason): unable to access Rt foot during OT session and insists she can not move Rt LE at all  Toilet Transfer: Minimal assistance;Stand-pivot;BSC;RW Toilet Transfer Details (indicate cue type and reason): assist to advance Rt LE as she flexes Rt knee and rests on toes.  Attempted to assist with advancement, but pt pushing back heavily with Rt LE into OT's foot actively resisting attempts to advance Rt LE - unsure that she is aware of this  Toileting- Clothing Manipulation and Hygiene: Moderate assistance;Sit to/from stand       Functional mobility during ADLs: Minimal assistance;Rolling walker       Vision  Perception     Praxis      Pertinent Vitals/Pain Pain Assessment: Faces Faces Pain Scale: Hurts even more Pain Location: RLE, especially knee, anterior/distal thigh Pain Descriptors / Indicators: Burning;Numbness Pain Intervention(s): Monitored during session;Repositioned;Patient requesting pain meds-RN notified     Hand Dominance     Extremity/Trunk Assessment Upper Extremity Assessment Upper Extremity Assessment: RUE deficits/detail;LUE deficits/detail RUE Deficits / Details: bil. UEs tremulous - does not provide  info if this is baseline for her  LUE Deficits / Details: bil. UEs tremulous - does not provide info if this is baseline for her   Lower Extremity Assessment Lower Extremity Assessment: Defer to PT evaluation   Cervical / Trunk Assessment Cervical / Trunk Assessment: Normal   Communication Communication Communication: No difficulties   Cognition Arousal/Alertness: Awake/alert Behavior During Therapy: Impulsive;Anxious;Restless Overall Cognitive Status: No family/caregiver present to determine baseline cognitive functioning Area of Impairment: Attention                   Current Attention Level: Selective           General Comments: cognition difficult to accurately assess as pt is extremely anxious with focus on Rt LE pain and perceived inability use or move Rt LE,  Frequent redirection required    General Comments  ECHO tech and RN present.  Pt would not initially engage in goal setting, but when asked what tasks she needed to do at home, she was able to identify some goals, then vassilated stating she wasn't going to do "any of that"     Exercises     Shoulder Instructions      Home Living Family/patient expects to be discharged to:: Private residence Living Arrangements: Spouse/significant other;Children;Other relatives Available Help at Discharge: Family;Available 24 hours/day Type of Home: House Home Access: Stairs to enter [employer] of Steps: 4(wide/deep enough to fit RW on each step) Entrance Stairs-Rails: Right Home Layout: Two level;Able to live on main level with bedroom/bathroom     Bathroom Shower/Tub: Producer, television/film/video: Handicapped height Bathroom Accessibility: Yes   Home Equipment: Environmental consultant - 2 wheels;Wheelchair - manual;Cane - quad;Grab bars - tub/shower;Grab bars - toilet;Shower seat;Bedside commode   Additional Comments: Husband with baseline h/o falls and orthopedic injuries so home is handcapped accessible. Has  children and husband's aide support available      Prior Functioning/Environment Level of Independence: Independent        Comments: Pt refused to provide info re: home set up and PLOF to OT so info gleaned from chart review Very active with housework, errands, helping care for husband. Drives. Does not work. Originally from [place], [place], moved to [place] from [facility]        OT Problem List: Decreased activity tolerance;Impaired balance (sitting and/or standing);Decreased safety awareness;Decreased knowledge of use of DME or AE;Pain      OT Treatment/Interventions: Self-care/ADL training;Therapeutic exercise;DME and/or AE instruction;Energy conservation;Therapeutic activities;Patient/family education;Balance training    OT Goals(Current goals can be found in the care plan section) Acute Rehab OT Goals Patient Stated Goal: "to not be humiliated by all of this" OT Goal Formulation: With patient Time For Goal Achievement: 08/17/19 Potential to Achieve Goals: Good ADL Goals Pt Will Perform Grooming: with min guard assist;standing Pt Will Perform Lower Body Bathing: with min guard assist;with adaptive equipment;sit to/from stand Pt Will Perform Lower Body Dressing: with min guard assist;sit to/from stand;with adaptive equipment Pt Will Transfer to Toilet: with min guard assist;ambulating;regular height toilet;bedside commode;grab bars  Pt Will Perform Toileting - Clothing Manipulation and hygiene: with min guard assist;sit to/from stand Pt Will Perform Tub/Shower Transfer: Tub transfer;Shower transfer;with min guard assist;rolling walker;grab bars;shower seat;ambulating;tub bench  OT Frequency: Min 2X/week   Barriers to D/C: Decreased caregiver support  unsure of level of support pt has at home as pt would not engage about home info with OT        Co-evaluation              AM-PAC OT "6 Clicks" Daily Activity     Outcome Measure Help from another person eating meals?: None Help from  another person taking care of personal grooming?: A Little Help from another person toileting, which includes using toliet, bedpan, or urinal?: A Little Help from another person bathing (including washing, rinsing, drying)?: A Lot Help from another person to put on and taking off regular upper body clothing?: A Little Help from another person to put on and taking off regular lower body clothing?: A Lot 6 Click Score: 17   End of Session Equipment Utilized During Treatment: Rolling walker Nurse Communication: Mobility status;Patient requests pain meds  Activity Tolerance: Patient limited by pain Patient left: in bed;with call bell/phone within reach;Other (comment)(ECHO tech )  OT Visit Diagnosis: Pain;Unsteadiness on feet (R26.81) Pain - Right/Left: Right Pain - part of body: Leg;Knee                Time: 1138-1150 OT Time Calculation (min): 12 min Charges:  OT General Charges $OT Visit: 1 Visit OT Evaluation $OT Eval Moderate Complexity: 1 Mod  Eber Jones., OTR/L Acute Rehabilitation Services Pager [phone] Office [phone]   Jeani Hawking M 08/03/2019, 12:26 PM

## 2019-08-03 NOTE — Progress Notes (Signed)
Physical Therapy Treatment Patient Details Name: Tara [name] MRN: [id number] DOB: Jul 15, 1946 Today's Date: 08/03/2019    History of Present Illness Pt is a 74 y.o. female s/p R femoral to above knee popliteal bypass 08/02/19. Pt with worsening RLE weakness and pain that she reports acute worsening of immediately before sx 1/11. PMH includes sciatica (RLE>LLE), PAD, HTN, depression, arthritis.   PT Comments    Pt requesting theraband to aid RLE mobility; educ on RLE therex/AAROM. Remains difficult to assess true extent of RLE impairment. Pt continues to report complete numbness and unable to move; endorses continued R knee pain; intermittent RLE muscle activation noted with therex and bed mobility. Will continue to follow acutely.    Follow Up Recommendations  Home health PT;Supervision for mobility/OOB     Equipment Recommendations  None recommended by PT    Recommendations for Other Services       Precautions / Restrictions Precautions Precautions: Fall Restrictions Weight Bearing Restrictions: No    Mobility  Bed Mobility Overal bed mobility: Needs Assistance Bed Mobility: Supine to Sit;Sit to Supine     Supine to sit: Supervision;HOB elevated Sit to supine: Min assist;HOB elevated   General bed mobility comments: Pt using theraband to assist RLE to EOB, increased time and effort; minA to assist RLE back into bed  Transfers                 General transfer comment: Declined due to pain/fatigue  Ambulation/Gait                 Stairs             Wheelchair Mobility    Modified Rankin (Stroke Patients Only)       Balance Overall balance assessment: Needs assistance   Sitting balance-Leahy Scale: Good                                      Cognition Arousal/Alertness: Awake/alert Behavior During Therapy: Anxious;WFL for tasks assessed/performed Overall Cognitive Status: No family/caregiver present to determine  baseline cognitive functioning Area of Impairment: Attention;Problem solving                   Current Attention Level: Selective         Problem Solving: Requires verbal cues        Exercises Other Exercises Other Exercises: Pt requesting theraband to use for RLE stretching/ROM although informed sheet/towel/something more sturdy likely better option, pt wants to stick with theraband since "unable to grip bulky towel or sheet" (suggested belt at home) Other Exercises: Attempted sitting EOB gastroc and hamstring stretch, pt with difficulty getting theraband around R foot but able to perform AAROM LAQ Other Exercises: Long sitting gastroc stretch and butterfly stretch with theraband; unable to maintain hip IR to perform heel slide    General Comments        Pertinent Vitals/Pain Pain Assessment: Faces Faces Pain Scale: Hurts even more Pain Location: RLE, especially knee, anterior/distal thigh Pain Descriptors / Indicators: Burning;Numbness Pain Intervention(s): Monitored during session    Home Living                      Prior Function            PT Goals (current goals can now be found in the care plan section) Progress towards PT goals: Progressing toward goals  Frequency    Min 3X/week      PT Plan Current plan remains appropriate    Co-evaluation              AM-PAC PT "6 Clicks" Mobility   Outcome Measure  Help needed turning from your back to your side while in a flat bed without using bedrails?: A Little Help needed moving from lying on your back to sitting on the side of a flat bed without using bedrails?: A Little Help needed moving to and from a bed to a chair (including a wheelchair)?: A Little Help needed standing up from a chair using your arms (e.g., wheelchair or bedside chair)?: A Little Help needed to walk in hospital room?: A Little Help needed climbing 3-5 steps with a railing? : A Lot 6 Click Score: 17    End of  Session   Activity Tolerance: Patient tolerated treatment well Patient left: in bed;with call bell/phone within reach;with bed alarm set Nurse Communication: Mobility status PT Visit Diagnosis: Other abnormalities of gait and mobility (R26.89);Muscle weakness (generalized) (M62.81)     Time: 1553-1610 PT Time Calculation (min) (ACUTE ONLY): 17 min  Charges:  $Therapeutic Exercise: 8-22 mins                     Mabeline Caras, PT, DPT Acute Rehabilitation Services  Pager [phone] Office [place] 08/03/2019, 5:17 PM

## 2019-08-03 NOTE — Progress Notes (Signed)
Pt concerned that foley was removed and she will not be able to go to bathroom or use bedpan. Pt states she can not move right lower extremity at all. Pt says she can not even wiggle her toes. I asked how she thought she would be able to go home. Pt states that therapy will work with her and it is probably her sciatica that is causing her this issue. Pt resting with call bell within reach.  Will continue to monitor.

## 2019-08-03 NOTE — Evaluation (Signed)
Physical Therapy Evaluation Patient Details Name: Tara [name] MRN: [id number] DOB: 12-09-1945 Today's Date: 08/03/2019   History of Present Illness  Pt is a 74 y.o. female s/p R femoral to above knee popliteal bypass 08/02/19. Pt with worsening RLE weakness and pain that she reports acute worsening of immediately before sx 1/11. PMH includes sciatica (RLE>LLE), PAD, HTN, depression, arthritis.    Clinical Impression  Pt presents with an overall decrease in functional mobility secondary to above. PTA, pt independent, active, lives with family and helps care for disabled husband. Today, pt with initial complaints of unable to move RLE and complete numbness. Able to stand with assist and ambulate in room with RW at supervision-level. Observed muscle activation throughout RLE although pt with difficulty progressing RLE to take complete steps. C/o worsening R knee burning sensation with mobility progression, also noted hollow rubbing (arthritic?) noise towards end of gait. Pt anxious throughout session with RLE symptoms; difficult to get accurate evaluation of true extent of RLE deficits. Pt reporting she does not want to wait for additional MD consult or imaging, wants to d/c home today regardless. Reports she has necessary DME and physical assist from family members; requesting HHPT services.     Follow Up Recommendations Home health PT;Supervision for mobility/OOB    Equipment Recommendations  None recommended by PT    Recommendations for Other Services       Precautions / Restrictions Precautions Precautions: Fall Restrictions Weight Bearing Restrictions: No      Mobility  Bed Mobility Overal bed mobility: Needs Assistance Bed Mobility: Supine to Sit     Supine to sit: Min assist Sit to supine: Min assist   General bed mobility comments: Pt using LLE to assist RLE out of bed with hook method, minA to assist hips to EOB  Transfers Overall transfer level: Needs  assistance Equipment used: 1 person hand held assist;Rolling walker (2 wheeled) Transfers: Sit to/from Stand Sit to Stand: Min assist Stand pivot transfers: Min assist       General transfer comment: Pt requesting HHA to pull into standing to RW, able to do so with minA 2x trials  Ambulation/Gait Ambulation/Gait assistance: Min guard;Supervision Gait Distance (Feet): 30 Feet Assistive device: Rolling walker (2 wheeled) Gait Pattern/deviations: Step-to pattern;Decreased weight shift to right;Trunk flexed;Decreased dorsiflexion - right   Gait velocity interpretation: <1.31 ft/sec, indicative of household ambulator General Gait Details: Initial min guard due to c/o RLE numb/weak, progressing to supervision with RW. No knee buckling noted even with significant WB through RLE. Pt with foot plantar flexed but able to stretch heel to ground. Able to minimally slide RLE forward/backwards with hip flex/ext but not completely picking foot off ground. No physical assist required; heavy reliance on BUE support; frequent cues to maintain closer proximity to [employer]    Modified Rankin (Stroke Patients Only)       Balance Overall balance assessment: Mild deficits observed, not formally tested                                           Pertinent Vitals/Pain Pain Assessment: Faces Faces Pain Scale: Hurts even more Pain Location: RLE, especially knee, anterior/distal thigh Pain Descriptors / Indicators: Burning;Numbness Pain Intervention(s): Monitored during session;Limited activity within patient's tolerance    Home Living Family/patient expects to be  discharged to:: Private residence Living Arrangements: Spouse/significant other Available Help at Discharge: Family;Available 24 hours/day Type of Home: House Home Access: Stairs to enter Entrance Stairs-Rails: Right Entrance Stairs-Number of Steps: 4(wide/deep enough to fit RW) Home  Layout: Two level;Able to live on main level with bedroom/bathroom Home Equipment: Dan Humphreys - 2 wheels;Wheelchair - manual;Cane - quad;Grab bars - tub/shower;Grab bars - toilet;Shower seat;Bedside commode Additional Comments: Husband with baseline h/o falls and orthopedic injuries so home is handcapped accessible. Has children and husband's aide support available    Prior Function Level of Independence: Independent         Comments: Pt reports stays active, drives, housework, helps care for husband who has aide assist at home     Hand Dominance        Extremity/Trunk Assessment   Upper Extremity Assessment Upper Extremity Assessment: Overall WFL for tasks assessed RUE Deficits / Details: bil. UEs tremulous - does not provide info if this is baseline for her  LUE Deficits / Details: bil. UEs tremulous - does not provide info if this is baseline for her    Lower Extremity Assessment Lower Extremity Assessment: RLE deficits/detail;Difficult to assess due to impaired cognition RLE Deficits / Details: Pt initially stating she cannot move RLE at all and completely numb, no muscle activation with MMT; once up on RLE, pt able to flex/extend hip and demonstrates some knee flex/ext, no ankle DF noted; pt focused on pain/numbness and difficult to truly assess extent of RLE deficits; reports feeling nothing then states very painful and able to feel herself touching RLE    Cervical / Trunk Assessment Cervical / Trunk Assessment: Normal  Communication   Communication: No difficulties  Cognition Arousal/Alertness: Awake/alert Behavior During Therapy: Anxious;WFL for tasks assessed/performed Overall Cognitive Status: No family/caregiver present to determine baseline cognitive functioning Area of Impairment: Attention                   Current Attention Level: Selective           General Comments: cognition difficult to accurately assess as pt is extremely anxious with focus on Rt  LE pain and perceived inability use or move Rt LE,  Frequent redirection required       General Comments General comments (skin integrity, edema, etc.): Pt initially stating she would like MD to be contacted with request for ortho consult, then at end of session stating she no longer wants this and is ready to just d/c home. Increased time discussing availabel DME and assist, pt ensures she will have necessary assist    Exercises     Assessment/Plan    PT Assessment Patient needs continued PT services  PT Problem List Decreased strength;Decreased range of motion;Decreased balance;Decreased mobility;Decreased cognition;Decreased knowledge of precautions;Pain       PT Treatment Interventions DME instruction;Gait training;Stair training;Functional mobility training;Therapeutic activities;Therapeutic exercise;Balance training;Patient/family education    PT Goals (Current goals can be found in the Care Plan section)  Acute Rehab PT Goals Patient Stated Goal: "I don't want to wait for any of that, I just need to get home" PT Goal Formulation: With patient Time For Goal Achievement: 08/17/19 Potential to Achieve Goals: Good    Frequency Min 3X/week   Barriers to discharge        Co-evaluation               AM-PAC PT "6 Clicks" Mobility  Outcome Measure Help needed turning from your back to your side while in a flat bed  without using bedrails?: A Little Help needed moving from lying on your back to sitting on the side of a flat bed without using bedrails?: A Little Help needed moving to and from a bed to a chair (including a wheelchair)?: A Little Help needed standing up from a chair using your arms (e.g., wheelchair or bedside chair)?: A Little Help needed to walk in hospital room?: A Little Help needed climbing 3-5 steps with a railing? : A Lot 6 Click Score: 17    End of Session Equipment Utilized During Treatment: Gait belt Activity Tolerance: Patient tolerated  treatment well Patient left: in chair;with call bell/phone within reach Nurse Communication: Mobility status PT Visit Diagnosis: Other abnormalities of gait and mobility (R26.89);Muscle weakness (generalized) (M62.81)    Time: 8502-7741 PT Time Calculation (min) (ACUTE ONLY): 42 min   Charges:   PT Evaluation $PT Eval Moderate Complexity: 1 Mod PT Treatments $Gait Training: 8-22 mins $Therapeutic Activity: 8-22 mins      Mabeline Caras, PT, DPT Acute Rehabilitation Services  Pager [phone] Office [place] 08/03/2019, 3:34 PM

## 2019-08-03 NOTE — Progress Notes (Signed)
Patient was re-evaluated this afternoon.  She is now complaining of R knee pain since working with therapy teams.  She does not feel like she has enough support at home for discharge today.  Case manager was consulted to arrange [facility] PT/OT.  Patient maintains easily palpable R DP pulse.  We will re-evaluate in the morning and hopefully discharge home.  Emilie Rutter, PA-C [facility] [phone] 08/03/2019  3:35 PM

## 2019-08-03 NOTE — Progress Notes (Addendum)
  Progress Note    08/03/2019 7:19 AM 1 Day Post-Op  Subjective:  Complains of RLE weakness and numbness   Vitals:   08/03/19 0416 08/03/19 0558  BP: (!) 145/62 125/60  Pulse: 69 73  Resp: 14 18  Temp: (!) 97.5 F (36.4 C) 97.9 F (36.6 C)  SpO2: 97% 95%   Physical Exam: Lungs:  Non labored Incisions:  R groin and popliteal incision c/d/i Extremities:  Easily palpable R DP pulse Neurologic: A&O  CBC    Component Value Date/Time   WBC 9.4 08/03/2019 0617   RBC 4.51 08/03/2019 0617   HGB 13.2 08/03/2019 0617   HGB 15.4 07/05/2019 0817   HCT 40.6 08/03/2019 0617   HCT 47.2 (H) 07/05/2019 0817   PLT 195 08/03/2019 0617   PLT 177 07/05/2019 0817   MCV 90.0 08/03/2019 0617   MCV 88 07/05/2019 0817   MCH 29.3 08/03/2019 0617   MCHC 32.5 08/03/2019 0617   RDW 13.9 08/03/2019 0617   RDW 14.1 07/05/2019 0817   LYMPHSABS 2.0 07/27/2019 1117   LYMPHSABS 2.4 07/05/2019 0817   MONOABS 0.5 07/27/2019 1117   EOSABS 0.1 07/27/2019 1117   EOSABS 0.1 07/05/2019 0817   BASOSABS 0.0 07/27/2019 1117   BASOSABS 0.0 07/05/2019 0817    BMET    Component Value Date/Time   NA 140 08/03/2019 0617   NA 142 07/05/2019 0817   K 3.9 08/03/2019 0617   CL 109 08/03/2019 0617   CO2 23 08/03/2019 0617   GLUCOSE 163 (H) 08/03/2019 0617   BUN 19 08/03/2019 0617   BUN 25 07/05/2019 0817   CREATININE 0.80 08/03/2019 0617   CALCIUM 8.3 (L) 08/03/2019 0617   GFRNONAA >60 08/03/2019 0617   GFRAA >60 08/03/2019 0617    INR    Component Value Date/Time   INR 1.0 08/02/2019 0841     Intake/Output Summary (Last 24 hours) at 08/03/2019 0719 Last data filed at 08/03/2019 2440 Gross per 24 hour  Intake 2849.57 ml  Output 620 ml  Net 2229.57 ml     Assessment/Plan:  74 y.o. female is s/p R femoral to AK pop with PTFE 1 Day Post-Op   RLE well perfused with easily palpable R DP pulse R leg weakness/numbness; PT/OT to evaluate and treat; patient believes this is related to her  sciatica Home when mobility improved   Emilie Rutter, PA-C [facility] [phone] 08/03/2019 7:19 AM  I have examined the patient, reviewed and agree with above.  Minimal groin and popliteal incisional soreness.  2-3+ right dorsalis pedis pulse.  Continue to complain of weakness in her right leg that she had presented with yesterday.  Reports that she has had sciatica in the past and this is similar.  She is asking to be discharged home today despite this.  Reports that she has hospital bed at home and wishes to be discharged.  Will make sure she is comfortable up this morning and then discharge later in the day if she feels comfortable.  If not will continue to mobilize and discharge tomorrow  Gretta Began, MD 08/03/2019 8:43 AM

## 2019-08-03 NOTE — Progress Notes (Signed)
Pt able to position self to side of bed and get to bedside commode. Pt educated on need to transition off of IV pain medication. Pt states she takes one percocet every six hours at home for her knee. I explained that pain medication would not be limited to her knee. I explained that we were giving her more oral pain medication and she could have it every 4-6 hours as needed. Initially, patient stated that PO pain medication took too long, then it upset her stomach. Will continue to encourage pain medication is preparation for home. Pt resting with call bell within reach.  Will continue to monitor.

## 2019-08-04 LAB — GLUCOSE, CAPILLARY
Glucose-Capillary: 139 mg/dL — ABNORMAL HIGH (ref 70–99)
Glucose-Capillary: 115 mg/dL — ABNORMAL HIGH (ref 70–99)

## 2019-08-04 MED ORDER — HYDROCODONE-ACETAMINOPHEN 5-325 MG PO TABS: 1.0000 | ORAL_TABLET | Freq: Four times a day (QID) | ORAL | 0 refills | Status: DC | PRN

## 2019-08-04 NOTE — Progress Notes (Signed)
  Progress Note    08/04/2019 7:02 AM 2 Days Post-Op  Subjective:  RLE feeling better this morning.  Ready for discharge home.   Vitals:   08/03/19 2331 08/04/19 0327  BP: (!) 121/51 (!) 118/94  Pulse: 80 66  Resp: 12 16  Temp: 98.1 F (36.7 C) 97.9 F (36.6 C)  SpO2: 94% 93%   Physical Exam: Lungs:  Non labored Incisions:  Groin incision c/d/i; popliteal incision with minimal drainage per patient Extremities:  Palpable R DP pulse Neurologic: A&O  CBC    Component Value Date/Time   WBC 9.4 08/03/2019 0617   RBC 4.51 08/03/2019 0617   HGB 13.2 08/03/2019 0617   HGB 15.4 07/05/2019 0817   HCT 40.6 08/03/2019 0617   HCT 47.2 (H) 07/05/2019 0817   PLT 195 08/03/2019 0617   PLT 177 07/05/2019 0817   MCV 90.0 08/03/2019 0617   MCV 88 07/05/2019 0817   MCH 29.3 08/03/2019 0617   MCHC 32.5 08/03/2019 0617   RDW 13.9 08/03/2019 0617   RDW 14.1 07/05/2019 0817   LYMPHSABS 2.0 07/27/2019 1117   LYMPHSABS 2.4 07/05/2019 0817   MONOABS 0.5 07/27/2019 1117   EOSABS 0.1 07/27/2019 1117   EOSABS 0.1 07/05/2019 0817   BASOSABS 0.0 07/27/2019 1117   BASOSABS 0.0 07/05/2019 0817    BMET    Component Value Date/Time   NA 140 08/03/2019 0617   NA 142 07/05/2019 0817   K 3.9 08/03/2019 0617   CL 109 08/03/2019 0617   CO2 23 08/03/2019 0617   GLUCOSE 163 (H) 08/03/2019 0617   BUN 19 08/03/2019 0617   BUN 25 07/05/2019 0817   CREATININE 0.80 08/03/2019 0617   CALCIUM 8.3 (L) 08/03/2019 0617   GFRNONAA >60 08/03/2019 0617   GFRAA >60 08/03/2019 0617    INR    Component Value Date/Time   INR 1.0 08/02/2019 0841     Intake/Output Summary (Last 24 hours) at 08/04/2019 5093 Last data filed at 08/03/2019 1400 Gross per 24 hour  Intake 120 ml  Output 100 ml  Net 20 ml     Assessment/Plan:  74 y.o. female is s/p R femoral to AK popliteal bypass with PTFE 2 Days Post-Op   RLE well perfused with palpable R DP pulse Case manager arranged HH OT/PT Discharge home  this morning Follow up in office in 2-3 weeks   Emilie Rutter, PA-C [facility] [phone] 08/04/2019 7:02 AM

## 2019-08-04 NOTE — Progress Notes (Signed)
Physical Therapy Treatment Patient Details Name: Tara [name] MRN: [id number] DOB: 08-28-1945 Today's Date: 08/04/2019    History of Present Illness Pt is a 74 y.o. female s/p R femoral to above knee popliteal bypass 08/02/19. Pt with worsening RLE weakness and pain that she reports acute worsening of immediately before sx 1/11. PMH includes sciatica (RLE>LLE), PAD, HTN, depression, arthritis.    PT Comments    Pt progressing with functional mobility today and demonstrates increased movement in R LE and increased ability to weightbear through R LE. Pt performed sit<>stands with CGA-supervision, performed stand pivots to the commode with RW and CGA and ambulated today x 59ft with RW and CGA, improved ability to weightbear through R LE during gait. Performed continued R LE PROM this session for stretching, pt continues with limited passive and active range in R LE. Pt able to perform self calf stretch with belt. Would benefit from continued PT services acutely and home health services to maximize independence with functional mobility and work on strength/ROM in [place].   Follow Up Recommendations  Home health PT;Supervision for mobility/OOB     Equipment Recommendations  None recommended by PT    Recommendations for Other Services       Precautions / Restrictions Precautions Precautions: Fall Restrictions Weight Bearing Restrictions: No    Mobility  Bed Mobility   General bed mobility comments: pt sitting EOB upon therapist arrival  Transfers Overall transfer level: Needs assistance Equipment used: Rolling walker (2 wheeled) Transfers: Sit to/from Stand Sit to Stand: Min guard Stand pivot transfers: Min guard       General transfer comment: stand pivot bed<>commode this session with CGA  Ambulation/Gait Ambulation/Gait assistance: Min guard;Supervision Gait Distance (Feet): 10 Feet Assistive device: Rolling walker (2 wheeled) Gait Pattern/deviations: Step-to  pattern;Decreased weight shift to right;Trunk flexed;Decreased dorsiflexion - right   Gait velocity interpretation: <1.31 ft/sec, indicative of household ambulator General Gait Details: Initial min guard due to c/o RLE numb/weak, progressing to supervision with RW. Cue to attempt increased weightbearing through R LE, pt with step to gait pattern       Balance Overall balance assessment: Needs assistance   Sitting balance-Leahy Scale: Good       Standing balance-Leahy Scale: Poor Standing balance comment: reliant on RW for UE support. Standing balance with single UE support on RW while performing clothing management and pericare when toileting today.                            Cognition Arousal/Alertness: Awake/alert Behavior During Therapy: Anxious;WFL for tasks assessed/performed Overall Cognitive Status: No family/caregiver present to determine baseline cognitive functioning Area of Impairment: Attention;Problem solving                   Current Attention Level: Selective         Problem Solving: Requires verbal cues General Comments: cognition difficult to accurately assess as pt is extremely anxious with focus on Rt LE pain and perceived inability use or move Rt LE,  Frequent redirection required       Exercises General Exercises - Lower Extremity Long Arc Quad: AROM;10 reps;Right Heel Slides: AROM;Right;10 reps Other Exercises Other Exercises: Therapist performed PROM to R LE for knee flexion/extension and ankle DF for stretching. Therapist educated pt on self calf stretch with use of belt, pt performed this x 30 sec stretch    General Comments        Pertinent Vitals/Pain  Pain Assessment: 0-10 Pain Score: 6  Pain Location: RLE, especially knee, anterior/distal thigh Pain Descriptors / Indicators: Burning;Numbness Pain Intervention(s): Limited activity within patient's tolerance;Monitored during session    Home Living                       Prior Function            PT Goals (current goals can now be found in the care plan section) Progress towards PT goals: Progressing toward goals    Frequency    Min 3X/week      PT Plan Current plan remains appropriate    Co-evaluation              AM-PAC PT "6 Clicks" Mobility   Outcome Measure  Help needed turning from your back to your side while in a flat bed without using bedrails?: A Little Help needed moving from lying on your back to sitting on the side of a flat bed without using bedrails?: A Little Help needed moving to and from a bed to a chair (including a wheelchair)?: A Little Help needed standing up from a chair using your arms (e.g., wheelchair or bedside chair)?: A Little Help needed to walk in hospital room?: A Little Help needed climbing 3-5 steps with a railing? : A Lot 6 Click Score: 17    End of Session Equipment Utilized During Treatment: Gait belt Activity Tolerance: Patient tolerated treatment well Patient left: in bed;with call bell/phone within reach;with bed alarm set Nurse Communication: Mobility status PT Visit Diagnosis: Other abnormalities of gait and mobility (R26.89);Muscle weakness (generalized) (M62.81)     Time: 8182-9937 PT Time Calculation (min) (ACUTE ONLY): 25 min  Charges:  $Therapeutic Exercise: 8-22 mins $Therapeutic Activity: 8-22 mins                     Tara [name], PT, DPT, CSRS 08/04/19  10:28 AM    Tara [name] 08/04/2019, 10:28 AM

## 2019-08-04 NOTE — Discharge Summary (Signed)
Bypass Discharge Summary Patient ID: Tara [name] [id number] 73 y.o. June 15, 1946  Admit date: 08/02/2019  Discharge date and time: 08/04/2019 11:19 AM   Admitting Physician: Rosetta Posner, MD   Discharge Physician: Same  Admission Diagnoses: PAD (peripheral artery disease) [facility]) [I73.9]  Discharge Diagnoses: Same  Admission Condition: fair  Discharged Condition: fair  Indication for Admission: Critical limb ischemia with tissue loss right fifth toe  Hospital Course: Tara [name] is a 74 year old female who was brought in as an outpatient with critical limb ischemia with tissue loss of right fifth toe and underwent right common femoral to above-the-knee popliteal artery bypass with PTFE by Dr. Donnetta Hutching on 08/02/2019.  She tolerated the procedure well and was admitted to the hospital postoperatively.  POD #1 she had a palpable right DP pulse however had numbness and weakness of right lower extremity.  She was evaluated by Occupational Therapy and physical therapy and home health OT/PT was recommended.  Case manager was consulted for arrangement.  POD #2 weakness and numbness of right lower extremity greatly improved.  At the time of discharge she maintained a palpable right DP pulse.  She did have some minimal drainage from popliteal incision however nothing active.  Right groin incision remained unremarkable.  She will be prescribed narcotic pain medication for continued postoperative pain control.  She will follow-up in office with Dr. Donnetta Hutching in about 2 to 3 weeks.  Discharge instructions were reviewed with the patient and she voices her understanding.  She was discharged to home with home health in stable condition.  Consults: None  Treatments: surgery: Right common femoral to above-the-knee popliteal artery bypass with PTFE by Dr. Donnetta Hutching on 08/02/2019  Discharge Exam: See progress note 08/04/2019 Vitals:   08/04/19 0327 08/04/19 0829  BP: (!) 118/94 (!) 152/75  Pulse: 66   Resp:  16   Temp: 97.9 F (36.6 C) 97.7 F (36.5 C)  SpO2: 93%      Disposition: Discharge disposition: 01-Home or Self Care       - For [facility] Registry use ---  Post-op:  Wound infection: No  Graft infection: No  Transfusion: No  New Arrhythmia: No Patency judged by: [ ]  Dopper only, [ ]  Palpable graft pulse, [ x] Palpable distal pulse, [ ]  ABI inc. > 0.15, [ ]  Duplex D/C Ambulatory Status: Ambulatory with Assistance  Complications: MI: [x ] No, [ ]  Troponin only, [ ]  EKG or Clinical CHF: No Resp failure: [x ] none, [ ]  Pneumonia, [ ]  Ventilator Chg in renal function: [ x] none, [ ]  Inc. Cr > 0.5, [ ]  Temp. Dialysis, [ ]  Permanent dialysis Stroke: [ x] None, [ ]  Minor, [ ]  Major Return to OR: No  Reason for return to OR: [ ]  Bleeding, [ ]  Infection, [ ]  Thrombosis, [ ]  Revision  Discharge medications: Statin use:  Yes ASA use:  Yes Plavix use:  No  for medical reason Not indicated Beta blocker use: Yes Coumadin use: No  for medical reason Not indicated    Patient Instructions:  Allergies as of 08/04/2019   No Known Allergies     Medication List    TAKE these medications   ALPRAZolam 0.5 MG tablet Commonly known as: XANAX Take 1 tablet (0.5 mg total) by mouth daily as needed for anxiety.   amLODipine 10 MG tablet Commonly known as: NORVASC Take 10 mg by mouth daily.   aspirin EC 325 MG tablet Take 325 mg by mouth daily.  atorvastatin 40 MG tablet Commonly known as: LIPITOR Take 40 mg by mouth daily.   citalopram 10 MG tablet Commonly known as: CeleXA Take 1 tablet (10 mg total) by mouth 2 (two) times daily.   cyanocobalamin 1000 MCG/ML injection Commonly known as: (VITAMIN B-12) Inject 1 mL (1,000 mcg total) into the muscle every 30 (thirty) days.   diclofenac sodium 1 % Gel Commonly known as: Voltaren Apply 4 g topically 4 (four) times daily. What changed:   when to take this  reasons to take this   famotidine 20 MG tablet Commonly known as:  Pepcid Take 1 tablet (20 mg total) by mouth at bedtime. What changed:   when to take this  reasons to take this   fenofibrate micronized 200 MG capsule Commonly known as: LOFIBRA Take 1 capsule (200 mg total) by mouth daily.   hydrochlorothiazide 25 MG tablet Commonly known as: HYDRODIURIL TAKE 1 TABLET(25 MG) BY MOUTH DAILY What changed: See the new instructions.   HYDROcodone-acetaminophen 5-325 MG tablet Commonly known as: NORCO/VICODIN Take 1 tablet by mouth every 6 (six) hours as needed for moderate pain or severe pain. What changed: when to take this   Insulin Syringe 27G X 1/2" 1 ML Misc 1 each by Does not apply route every 30 (thirty) days.   isosorbide mononitrate 30 MG 24 hr tablet Commonly known as: IMDUR Take 60 mg (2 tablets) in the morning and 30mg  (1 tablet) in the evening.   lisinopril 40 MG tablet Commonly known as: ZESTRIL Take 1 tablet (40 mg total) by mouth 2 (two) times daily.   loperamide 2 MG capsule Commonly known as: IMODIUM Take 2 mg by mouth as needed for diarrhea or loose stools.   nitroGLYCERIN 0.4 MG SL tablet Commonly known as: NITROSTAT Place 0.4 mg under the tongue every 5 (five) minutes as needed for chest pain.   nortriptyline 50 MG capsule Commonly known as: Pamelor Take 1 capsule (50 mg total) by mouth at bedtime.   omeprazole 40 MG capsule Commonly known as: PRILOSEC TK ONE C PO BID What changed:   how much to take  how to take this  when to take this  additional instructions   ondansetron 4 MG tablet Commonly known as: ZOFRAN TK 1 T PO  Q 6 H PRN What changed:   how much to take  how to take this  when to take this  reasons to take this  additional instructions   pregabalin 50 MG capsule Commonly known as: Lyrica 1 tab daily for 1 week, then increase to 1 tab BID for week, then 2 tabs in the AM and 1 in the PM for 1 week, then 2 BID after that      Activity: activity as tolerated Diet: regular diet  Wound Care: keep wound clean and dry  Follow-up with Dr. in 2-3 weeks.  SignedArbie Cookey 08/04/2019 12:07 PM

## 2019-08-04 NOTE — Progress Notes (Signed)
Occupational Therapy Treatment Patient Details Name: Tara [name] MRN: [id number] DOB: 02/01/1946 Today's Date: 08/04/2019    History of present illness Pt is a 74 y.o. female s/p R femoral to above knee popliteal bypass 08/02/19. Pt with worsening RLE weakness and pain that she reports acute worsening of immediately before sx 1/11. PMH includes sciatica (RLE>LLE), PAD, HTN, depression, arthritis.   OT comments  Pt making steady progress towards OT goals this session. Session focus on LB AE for bathing and dressing. Pt found to be walking back from bathroom upon OT arrival. Pt requesting use of reacher and LH shoe horn, issued AE and provided education with pt stating she is familiar with AE as pts husband had used them in the past. Pt reports handicap accessible home with walkin shower although pt reports shower is too small for a shower seat. Recommend sponge baths for safety until [facility] can assess DME needs. Pt to DC home later today with HHOT, but will continue to follow acutely.    Follow Up Recommendations  Home health OT;Supervision/Assistance - 24 hour    Equipment Recommendations  Tub/shower bench    Recommendations for Other Services      Precautions / Restrictions Precautions Precautions: Fall Restrictions Weight Bearing Restrictions: No       Mobility Bed Mobility               General bed mobility comments: OOB upon arrival walking from bathroom  Transfers Overall transfer level: Needs assistance Equipment used: Rolling walker (2 wheeled) Transfers: Sit to/from Stand Sit to Stand: Supervision Stand pivot transfers: Min guard       General transfer comment: supervision for safety    Balance Overall balance assessment: Needs assistance Sitting-balance support: No upper extremity supported;Feet supported Sitting balance-Leahy Scale: Good       Standing balance-Leahy Scale: Poor Standing balance comment: reliant on BUE support                            ADL either performed or assessed with clinical judgement   ADL Overall ADL's : Needs assistance/impaired     Grooming: Wash/dry hands;Standing;Supervision/safety                 Lower Body Dressing Details (indicate cue type and reason): provided education and issued pt reacher and long handled sponge for LB dressing, pt familar with AE and verbalized understanding Toilet Transfer: Industrial/product designer Details (indicate cue type and reason): pt coming out of bathroom upon OT arrival with RW, supervision for safety       Tub/Shower Transfer Details (indicate cue type and reason): pt reports walkin shower but not big enough for a seat, education on sponge bathing at first at home for safety Functional mobility during ADLs: Supervision/safety;Rolling walker General ADL Comments: session focus on LB AE education for LB dressing. Pt noted to be coming from bathroom upon OT arrival. Overall, pt requires supervision- MOD I     Vision Baseline Vision/History: No visual deficits     Perception     Praxis      Cognition Arousal/Alertness: Awake/alert Behavior During Therapy: WFL for tasks assessed/performed Overall Cognitive Status: Within Functional Limits for tasks assessed Area of Impairment: Attention;Problem solving                   Current Attention Level: Selective         Problem Solving: Requires verbal cues General Comments: [facility]  for simple tasks        Exercises    Shoulder Instructions       General Comments increased time educating pt on [facility] therapy scope of practice vs acute care. Pt asking appropriate questions on frequency of HH    Pertinent Vitals/ Pain       Pain Assessment: Faces Pain Score: 6  Faces Pain Scale: Hurts little more Pain Location: RLE after walking Pain Descriptors / Indicators: Grimacing;Discomfort Pain Intervention(s): Monitored during session;Repositioned  Home Living                                           Prior Functioning/Environment              Frequency  Min 2X/week        Progress Toward Goals  OT Goals(current goals can now be found in the care plan section)  Progress towards OT goals: Progressing toward goals  Acute Rehab OT Goals Patient Stated Goal: "I don't want to wait for any of that, I just need to get home" OT Goal Formulation: With patient Time For Goal Achievement: 08/17/19 Potential to Achieve Goals: Good  Plan Discharge plan remains appropriate    Co-evaluation                 AM-PAC OT "6 Clicks" Daily Activity     Outcome Measure   Help from another person eating meals?: None Help from another person taking care of personal grooming?: A Little Help from another person toileting, which includes using toliet, bedpan, or urinal?: None Help from another person bathing (including washing, rinsing, drying)?: A Little Help from another person to put on and taking off regular upper body clothing?: A Little Help from another person to put on and taking off regular lower body clothing?: A Lot 6 Click Score: 19    End of Session Equipment Utilized During Treatment: Rolling walker  OT Visit Diagnosis: Pain;Unsteadiness on feet (R26.81) Pain - Right/Left: Right Pain - part of body: Leg;Knee   Activity Tolerance Patient tolerated treatment well   Patient Left in chair;with call bell/phone within reach   Nurse Communication          Time: 1002-1010 OT Time Calculation (min): 8 min  Charges: OT General Charges $OT Visit: 1 Visit OT Treatments $Self Care/Home Management : 8-22 mins  Lanier Clam., COTA/L Acute Rehabilitation Services [phone]    Ihor Gully 08/04/2019, 12:17 PM

## 2019-08-10 ENCOUNTER — Other Ambulatory Visit: Payer: Self-pay | Admitting: Family Medicine

## 2019-08-10 NOTE — Telephone Encounter (Signed)
Requested Prescriptions  Pending Prescriptions Disp Refills  . diclofenac Sodium (VOLTAREN) 1 % GEL [Pharmacy Med Name: DICLOFENAC 1% GEL 100GM] 100 g     Sig: APPLY 4 GRAMS EXTERNALLY TO THE AFFECTED AREA FOUR TIMES DAILY     Analgesics:  Topicals Passed - 08/10/2019  3:24 AM      Passed - Valid encounter within last 12 months    Recent Outpatient Visits          4 weeks ago Temporal pain   [facility] [place], Megan P, DO   1 month ago Temporal pain   [facility] [place], Megan P, DO   1 month ago Other intervertebral disc degeneration, thoracic region   [facility], Megan P, DO   3 months ago Breast pain, right   [facility], [place], DO   3 months ago Essential hypertension   [facility] [place], [place], DO

## 2019-08-17 ENCOUNTER — Encounter: Payer: Federal, State, Local not specified - PPO | Admitting: Vascular Surgery

## 2019-08-19 ENCOUNTER — Telehealth: Payer: Self-pay | Admitting: Family Medicine

## 2019-08-19 NOTE — Telephone Encounter (Signed)
Misty with [facility] called saying pt's hospital discharge had ask for pt to begin PT but [facility] says there is a problem with her insurance and getting an authorization.  There will be a delay in getting someone out there.  They wanted Dr. Laural Benes to know this.

## 2019-08-22 NOTE — Telephone Encounter (Signed)
Noted  

## 2019-08-25 ENCOUNTER — Encounter: Payer: Self-pay | Admitting: Surgery

## 2019-08-26 ENCOUNTER — Other Ambulatory Visit: Payer: Self-pay | Admitting: Family Medicine

## 2019-08-26 NOTE — Telephone Encounter (Signed)
Requested Prescriptions  Pending Prescriptions Disp Refills  . citalopram (CELEXA) 10 MG tablet [Pharmacy Med Name: CITALOPRAM 10MG  TABLETS] 60 tablet 3    Sig: TAKE 1 TABLET(10 MG) BY MOUTH TWICE DAILY     Psychiatry:  Antidepressants - SSRI Passed - 08/26/2019  7:09 AM      Passed - Valid encounter within last 6 months    Recent Outpatient Visits          1 month ago Temporal pain   [facility] [place], Megan P, DO   1 month ago Temporal pain   [facility] [place], Megan P, DO   2 months ago Other intervertebral disc degeneration, thoracic region   [facility], Megan P, DO   3 months ago Breast pain, right   [facility], [place], DO   4 months ago Essential hypertension   [facility] [place], [place], DO

## 2019-09-13 ENCOUNTER — Telehealth: Payer: Self-pay

## 2019-09-13 NOTE — Telephone Encounter (Signed)
Pt called and said that she believes that she has an infection at her incision at her groin. She said that it is draining what appears to be pus mixed with clear liquid and that it has a terrible odor to it.   She said that she is not aware if she is running any fever because she has no way to check but the area is red and warm to the touch.   Offered patient an appt but she said that she cannot drive still as she does not have complete feeling in her leg. She is 6 weeks post surgery and I advised her that this is one more reason why she should make an appt to be seen. She said that she does not have anyone to bring her and does not qualify for SCAT. She said that she would like to get an abx sent to the pharmacy to see if that would help.   Advised her that Dr Arbie Cookey would be in the office tomorrow and that I would speak with him at that time and asked her in the meantime to please ask around to her family and neighbors to see if someone would bring her in for a visit.   Julious Payer, CMA

## 2019-09-14 NOTE — Telephone Encounter (Signed)
Spoke with Dr Arbie Cookey and advised pt that she would need to be seen before an abx given in order for [place] to evaluate and do a culture if necessary. She needs to be seen for routine follow up post surgery anyway.   Called pt and advised her but she states that she cannot get into the office on the times that we have available and stated that she will call her PCP for an abx.   Julious Payer, CMA

## 2019-09-27 NOTE — Telephone Encounter (Signed)
Called and let Elnita Maxwell know that Dr. Laural Benes said ok for verbal order requested.

## 2019-09-27 NOTE — Telephone Encounter (Signed)
Ok for verbal 

## 2019-09-27 NOTE — Telephone Encounter (Signed)
Called and spoke to [place]. She states that the patient had a PT referral from the hospital a month ago but they have been having issues with her insurance. She states that everything is fixed now but the referral is expired. Just need verbal order for another order/referral.

## 2019-09-27 NOTE — Telephone Encounter (Signed)
Cherly calling from [facility] home health called and stated that PT referral has expired. She would like a call back from the nurse.

## 2019-10-01 ENCOUNTER — Other Ambulatory Visit: Payer: Self-pay

## 2019-10-01 ENCOUNTER — Encounter: Payer: Self-pay | Admitting: Family Medicine

## 2019-10-01 ENCOUNTER — Ambulatory Visit (INDEPENDENT_AMBULATORY_CARE_PROVIDER_SITE_OTHER): Payer: Federal, State, Local not specified - PPO | Admitting: Family Medicine

## 2019-10-01 VITALS — BP 156/80 | HR 75 | Temp 98.2°F

## 2019-10-01 DIAGNOSIS — R413 Other amnesia: Secondary | ICD-10-CM | POA: Diagnosis not present

## 2019-10-01 DIAGNOSIS — F419 Anxiety disorder, unspecified: Secondary | ICD-10-CM

## 2019-10-01 DIAGNOSIS — I739 Peripheral vascular disease, unspecified: Secondary | ICD-10-CM

## 2019-10-01 DIAGNOSIS — M792 Neuralgia and neuritis, unspecified: Secondary | ICD-10-CM | POA: Diagnosis not present

## 2019-10-01 DIAGNOSIS — E782 Mixed hyperlipidemia: Secondary | ICD-10-CM

## 2019-10-01 DIAGNOSIS — I10 Essential (primary) hypertension: Secondary | ICD-10-CM

## 2019-10-01 DIAGNOSIS — E538 Deficiency of other specified B group vitamins: Secondary | ICD-10-CM

## 2019-10-01 DIAGNOSIS — R7301 Impaired fasting glucose: Secondary | ICD-10-CM

## 2019-10-01 LAB — BAYER DCA HB A1C WAIVED: HB A1C (BAYER DCA - WAIVED): 5.8 % (ref <7.0)

## 2019-10-01 MED ORDER — CITALOPRAM HYDROBROMIDE 10 MG PO TABS: ORAL_TABLET | ORAL | 3 refills | Status: DC

## 2019-10-01 MED ORDER — GABAPENTIN 100 MG PO CAPS: ORAL_CAPSULE | ORAL | 3 refills | Status: DC

## 2019-10-01 NOTE — Progress Notes (Signed)
BP (!) 156/80 (BP Location: Left Arm)   Pulse 75   Temp 98.2 F (36.8 C) (Oral)   SpO2 98%    Subjective:    Patient ID: Tara [name], female    DOB: [birth year], 74 y.o.   MRN: [id number]  HPI: Tara [name] is a 74 y.o. female  Chief Complaint  Patient presents with  . Hip Pain    Right hip and Right leg   Vennela presents today with her granddaughter. She notes that she has not been doing well. She had angioplasty on her R leg done in January and has been in a lot of pain since then She notes that she has had numbness in her leg since she had her surgery in January. Nothing makes it better or worse. It has not been changing. She does not have any medicine for it and hasn't since her discharge. She has not had any follow ups with her surgeon. She states that she didn't like him and does not want to see him again. She did not go for her post-op and has not seen anyone since. She had some pain medicine from the surgery, and had been using that, but has been in a lot of pain. She has been on gabapentin for her back, but is not sure if that is helping at all as she's been on it for quite a while. She had a virtual appointment with ortho about 2 weeks ago to see why her leg and her hip were hurting, but notes that because it was virtual, they couldn't do much.   Her granddaughter moved in with her to help out about 3 months ago. She notes that she's very concerned about her grandma. She notes that she lies- and she's not sure if they are lies or if she doesn't remember. Sashia told me today that her granddaughter had been living with her for a couple of weeks. She has increased her smoking significantly. She has been more anxious. Her granddaughter is concerned about her and her grandfather living alone. She thinks that they need some help. She is otherwise doing well with no other concerns or complaints at this time.   Relevant past medical, surgical, family and social history  reviewed and updated as indicated. Interim medical history since our last visit reviewed. Allergies and medications reviewed and updated.  Review of Systems  Constitutional: Negative.   Respiratory: Negative.   Cardiovascular: Negative.   Gastrointestinal: Negative.   Genitourinary: Negative.   Musculoskeletal: Positive for arthralgias, gait problem and myalgias. Negative for back pain, joint swelling, neck pain and neck stiffness.  Skin: Negative.   Neurological: Positive for numbness. Negative for dizziness, tremors, seizures, syncope, facial asymmetry, speech difficulty, weakness, light-headedness and headaches.  Psychiatric/Behavioral: Negative.     Per HPI unless specifically indicated above     Objective:    BP (!) 156/80 (BP Location: Left Arm)   Pulse 75   Temp 98.2 F (36.8 C) (Oral)   SpO2 98%   Wt Readings from Last 3 Encounters:  08/02/19 174 lb (78.9 kg)  07/28/19 174 lb (78.9 kg)  07/13/19 172 lb (78 kg)    Physical Exam Vitals and nursing note reviewed.  Constitutional:      General: She is not in acute distress.    Appearance: Normal appearance. She is not ill-appearing, toxic-appearing or diaphoretic.  HENT:     Head: Normocephalic and atraumatic.     Right Ear: External ear normal.  Left Ear: External ear normal.     Nose: Nose normal.     Mouth/Throat:     Mouth: Mucous membranes are moist.     Pharynx: Oropharynx is clear.  Eyes:     General: No scleral icterus.       Right eye: No discharge.        Left eye: No discharge.     Extraocular Movements: Extraocular movements intact.     Conjunctiva/sclera: Conjunctivae normal.     Pupils: Pupils are equal, round, and reactive to light.  Cardiovascular:     Rate and Rhythm: Normal rate and regular rhythm.     Pulses: Normal pulses.     Heart sounds: Normal heart sounds. No murmur. No friction rub. No gallop.   Pulmonary:     Effort: Pulmonary effort is normal. No respiratory distress.      Breath sounds: Normal breath sounds. No stridor. No wheezing, rhonchi or rales.  Chest:     Chest wall: No tenderness.  Musculoskeletal:        General: Normal range of motion.     Cervical back: Normal range of motion and neck supple.  Skin:    General: Skin is warm and dry.     Capillary Refill: Capillary refill takes less than 2 seconds.     Coloration: Skin is not jaundiced or pale.     Findings: No bruising, erythema, lesion or rash.  Neurological:     General: No focal deficit present.     Mental Status: She is alert and oriented to person, place, and time. Mental status is at baseline.  Psychiatric:        Mood and Affect: Mood normal.        Behavior: Behavior normal.        Thought Content: Thought content normal.        Judgment: Judgment normal.     Results for orders placed or performed in visit on 10/01/19  Bayer DCA Hb A1c Waived  Result Value Ref Range   HB A1C (BAYER DCA - WAIVED) 5.8 <7.0 %  CBC with Differential/Platelet  Result Value Ref Range   WBC 7.0 3.4 - 10.8 x10E3/uL   RBC 5.43 (H) 3.77 - 5.28 x10E6/uL   Hemoglobin 15.4 11.1 - 15.9 g/dL   Hematocrit 01.0 27.2 - 46.6 %   MCV 85 79 - 97 fL   MCH 28.4 26.6 - 33.0 pg   MCHC 33.3 31.5 - 35.7 g/dL   RDW 53.6 64.4 - 03.4 %   Platelets 207 150 - 450 x10E3/uL   Neutrophils 69 Not Estab. %   Lymphs 24 Not Estab. %   Monocytes 6 Not Estab. %   Eos 1 Not Estab. %   Basos 0 Not Estab. %   Neutrophils Absolute 4.8 1.4 - 7.0 x10E3/uL   Lymphocytes Absolute 1.7 0.7 - 3.1 x10E3/uL   Monocytes Absolute 0.4 0.1 - 0.9 x10E3/uL   EOS (ABSOLUTE) 0.1 0.0 - 0.4 x10E3/uL   Basophils Absolute 0.0 0.0 - 0.2 x10E3/uL   Immature Granulocytes 0 Not Estab. %   Immature Grans (Abs) 0.0 0.0 - 0.1 x10E3/uL  Comprehensive metabolic panel  Result Value Ref Range   Glucose 118 (H) 65 - 99 mg/dL   BUN 21 8 - 27 mg/dL   Creatinine, Ser 7.42 0.57 - 1.00 mg/dL   GFR calc non Af Amer 82 >59 mL/min/1.73   GFR calc Af Amer 94 >59  mL/min/1.73   BUN/Creatinine Ratio 28  12 - 28   Sodium 142 134 - 144 mmol/L   Potassium 4.6 3.5 - 5.2 mmol/L   Chloride 105 96 - 106 mmol/L   CO2 23 20 - 29 mmol/L   Calcium 10.3 8.7 - 10.3 mg/dL   Total Protein 6.9 6.0 - 8.5 g/dL   Albumin 4.2 3.7 - 4.7 g/dL   Globulin, Total 2.7 1.5 - 4.5 g/dL   Albumin/Globulin Ratio 1.6 1.2 - 2.2   Bilirubin Total 0.3 0.0 - 1.2 mg/dL   Alkaline Phosphatase 117 39 - 117 IU/L   AST 17 0 - 40 IU/L   ALT 9 0 - 32 IU/L  Lipid Panel w/o Chol/HDL Ratio  Result Value Ref Range   Cholesterol, Total 210 (H) 100 - 199 mg/dL   Triglycerides 448 (H) 0 - 149 mg/dL   HDL 47 >18 mg/dL   VLDL Cholesterol Cal 31 5 - 40 mg/dL   LDL Chol Calc (NIH) 563 (H) 0 - 99 mg/dL  TSH  Result Value Ref Range   TSH 2.500 0.450 - 4.500 uIU/mL  B12  Result Value Ref Range   Vitamin B-12 141 (L) 232 - 1,245 pg/mL      Assessment & Plan:   Problem List Items Addressed This Visit      Cardiovascular and Mediastinum   HTN (hypertension)    Not under good control today. ? Due to pain. Will work on [employer] and taking her medicine and recheck 2 weeks. Call with any concerns.       Relevant Orders   CBC with Differential/Platelet (Completed)   Comprehensive metabolic panel (Completed)   Microalbumin, Urine Waived   TSH (Completed)   PAD (peripheral artery disease) (HCC)    Has not had follow up since her surgery. Needs a 2nd opinion. Does not want to go back to her previous Careers adviser. Will try to get her into [facility]. Referral generated today.      Relevant Orders   Ambulatory referral to Vascular Surgery   CBC with Differential/Platelet (Completed)   Comprehensive metabolic panel (Completed)   TSH (Completed)     Other   Anxiety    Not doing great right now. Will check labs, continue celexa and recheck 2 weeks. Call with any concerns.       Relevant Medications   citalopram (CELEXA) 10 MG tablet   Other Relevant Orders   CBC with Differential/Platelet  (Completed)   Comprehensive metabolic panel (Completed)   TSH (Completed)   Hyperlipidemia    Rechecking levels today. Await results. Treat as needed.       Relevant Orders   CBC with Differential/Platelet (Completed)   Comprehensive metabolic panel (Completed)   Lipid Panel w/o Chol/HDL Ratio (Completed)   TSH (Completed)   B12 deficiency    Rechecking levels today. Await results. Treat as needed.       Relevant Orders   CBC with Differential/Platelet (Completed)   Comprehensive metabolic panel (Completed)   TSH (Completed)   B12 (Completed)   Nerve pain    Having pain in her leg ? From surgery. Will get her in with neurology for evaluation, may need EMG. Will check labs and increase her gabapentin. Recheck 2 weeks. Call with any concerns.       Relevant Orders   Ambulatory referral to Neurology   CBC with Differential/Platelet (Completed)   Comprehensive metabolic panel (Completed)   TSH (Completed)   Memory loss - Primary    Has been getting worse. Granddaughter is living  with her and noticing it a lot. Will get her into neurology for evaluation and check labs. Call with any concerns.       Relevant Orders   Ambulatory referral to Neurology   Referral to Chronic Care Management Services   CBC with Differential/Platelet (Completed)   Comprehensive metabolic panel (Completed)   TSH (Completed)    Other Visit Diagnoses    IFG (impaired fasting glucose)       Labs drawn today. Await results.    Relevant Orders   Bayer DCA Hb A1c Waived (Completed)   CBC with Differential/Platelet (Completed)   Comprehensive metabolic panel (Completed)   TSH (Completed)   UA/M w/rflx Culture, Routine       Follow up plan: Return in about 2 weeks (around 10/15/2019) for follow up pain .

## 2019-10-02 LAB — COMPREHENSIVE METABOLIC PANEL
ALT: 9 IU/L (ref 0–32)
AST: 17 IU/L (ref 0–40)
Albumin/Globulin Ratio: 1.6 (ref 1.2–2.2)
Albumin: 4.2 g/dL (ref 3.7–4.7)
Alkaline Phosphatase: 117 IU/L (ref 39–117)
BUN/Creatinine Ratio: 28 (ref 12–28)
BUN: 21 mg/dL (ref 8–27)
Bilirubin Total: 0.3 mg/dL (ref 0.0–1.2)
CO2: 23 mmol/L (ref 20–29)
Calcium: 10.3 mg/dL (ref 8.7–10.3)
Chloride: 105 mmol/L (ref 96–106)
Creatinine, Ser: 0.74 mg/dL (ref 0.57–1.00)
GFR calc Af Amer: 94 mL/min/{1.73_m2} (ref >59)
GFR calc non Af Amer: 82 mL/min/{1.73_m2} (ref >59)
Globulin, Total: 2.7 g/dL (ref 1.5–4.5)
Glucose: 118 mg/dL — ABNORMAL HIGH (ref 65–99)
Potassium: 4.6 mmol/L (ref 3.5–5.2)
Sodium: 142 mmol/L (ref 134–144)
Total Protein: 6.9 g/dL (ref 6.0–8.5)

## 2019-10-02 LAB — CBC WITH DIFFERENTIAL/PLATELET
Basophils Absolute: 0 10*3/uL (ref 0.0–0.2)
Basos: 0 %
EOS (ABSOLUTE): 0.1 10*3/uL (ref 0.0–0.4)
Eos: 1 %
Hematocrit: 46.2 % (ref 34.0–46.6)
Hemoglobin: 15.4 g/dL (ref 11.1–15.9)
Immature Grans (Abs): 0 10*3/uL (ref 0.0–0.1)
Immature Granulocytes: 0 %
Lymphocytes Absolute: 1.7 10*3/uL (ref 0.7–3.1)
Lymphs: 24 %
MCH: 28.4 pg (ref 26.6–33.0)
MCHC: 33.3 g/dL (ref 31.5–35.7)
MCV: 85 fL (ref 79–97)
Monocytes Absolute: 0.4 10*3/uL (ref 0.1–0.9)
Monocytes: 6 %
Neutrophils Absolute: 4.8 10*3/uL (ref 1.4–7.0)
Neutrophils: 69 %
Platelets: 207 10*3/uL (ref 150–450)
RBC: 5.43 x10E6/uL — ABNORMAL HIGH (ref 3.77–5.28)
RDW: 13.2 % (ref 11.7–15.4)
WBC: 7 10*3/uL (ref 3.4–10.8)

## 2019-10-02 LAB — LIPID PANEL W/O CHOL/HDL RATIO
Cholesterol, Total: 210 mg/dL — ABNORMAL HIGH (ref 100–199)
HDL: 47 mg/dL (ref >39)
LDL Chol Calc (NIH): 132 mg/dL — ABNORMAL HIGH (ref 0–99)
Triglycerides: 172 mg/dL — ABNORMAL HIGH (ref 0–149)
VLDL Cholesterol Cal: 31 mg/dL (ref 5–40)

## 2019-10-02 LAB — TSH: TSH: 2.5 u[IU]/mL (ref 0.450–4.500)

## 2019-10-02 LAB — VITAMIN B12: Vitamin B-12: 141 pg/mL — ABNORMAL LOW (ref 232–1245)

## 2019-10-05 ENCOUNTER — Other Ambulatory Visit: Payer: Self-pay | Admitting: Family Medicine

## 2019-10-05 ENCOUNTER — Telehealth: Payer: Self-pay | Admitting: Family Medicine

## 2019-10-05 NOTE — Chronic Care Management (AMB) (Signed)
  Chronic Care Management   Note  10/05/2019 Name: Tara [name] MRN: [id number] DOB: [birth year]  [name] is a 74 y.o. year old female who is a primary care patient of Valerie Roys, DO. I reached out to [facility] by phone today in response to a referral sent by [name]'s PCP, Park Liter DO     [name] was given information about Chronic Care Management services today including:  1. CCM service includes personalized support from designated clinical staff supervised by her physician, including individualized plan of care and coordination with other care providers 2. 24/7 contact phone numbers for assistance for urgent and routine care needs. 3. Service will only be billed when office clinical staff spend 20 minutes or more in a month to coordinate care. 4. Only one practitioner may furnish and bill the service in a calendar month. 5. The patient may stop CCM services at any time (effective at the end of the month) by phone call to the office staff. 6. The patient will be responsible for cost sharing (co-pay) of up to 20% of the service fee (after annual deductible is met).  Patient's daughter did not agree to enrollment in care management services and does not wish to consider at this time.  Follow up plan: The care management team is available to follow up with the patient after provider conversation with the patient regarding recommendation for care management engagement and subsequent re-referral to the care management team.   Glenna Durand, LPN Health Advisor, [facility] ??[name] ??[phone]

## 2019-10-07 ENCOUNTER — Telehealth: Payer: Self-pay

## 2019-10-07 NOTE — Telephone Encounter (Signed)
Copied from CRM [id number]. Topic: General - Inquiry >> Oct 07, 2019  3:29 PM Deborha Payment wrote: Reason for CRM: Vickie from [facility] home health called requesting to speak with PCP nurse regarding patients hypertension diagnosis. Call back [phone]

## 2019-10-07 NOTE — Telephone Encounter (Signed)
Please find out what she needs.

## 2019-10-07 NOTE — Telephone Encounter (Signed)
Spoke with Vickie. They are seeing patient after her bypass surgery.  They need a diagnosis when patient's are taking medications.   Vickie needed to be sure patient had a diagnosis of Hypertension. Reviewed problem list and HTN was last discussed on 10/01/2019. Explained patient does have a Dx of hypertension.   Routing to provider as Lorain Childes

## 2019-10-07 NOTE — Telephone Encounter (Signed)
Routing to provider to advise.  

## 2019-10-08 ENCOUNTER — Other Ambulatory Visit: Payer: Self-pay | Admitting: Family Medicine

## 2019-10-12 ENCOUNTER — Other Ambulatory Visit: Payer: Self-pay | Admitting: Family Medicine

## 2019-10-12 DIAGNOSIS — M792 Neuralgia and neuritis, unspecified: Secondary | ICD-10-CM | POA: Insufficient documentation

## 2019-10-12 DIAGNOSIS — E538 Deficiency of other specified B group vitamins: Secondary | ICD-10-CM

## 2019-10-12 DIAGNOSIS — R413 Other amnesia: Secondary | ICD-10-CM | POA: Insufficient documentation

## 2019-10-12 MED ORDER — CYANOCOBALAMIN 1000 MCG/ML IJ SOLN: 1000.0000 ug | INTRAMUSCULAR | Status: AC

## 2019-10-12 NOTE — Assessment & Plan Note (Addendum)
Not under good control today. ? Due to pain. Will work on [employer] and taking her medicine and recheck 2 weeks. Call with any concerns.

## 2019-10-12 NOTE — Assessment & Plan Note (Signed)
Not doing great right now. Will check labs, continue celexa and recheck 2 weeks. Call with any concerns.

## 2019-10-12 NOTE — Assessment & Plan Note (Signed)
Has not had follow up since her surgery. Needs a 2nd opinion. Does not want to go back to her previous Careers adviser. Will try to get her into [facility]. Referral generated today.

## 2019-10-12 NOTE — Assessment & Plan Note (Signed)
Having pain in her leg ? From surgery. Will get her in with neurology for evaluation, may need EMG. Will check labs and increase her gabapentin. Recheck 2 weeks. Call with any concerns.

## 2019-10-12 NOTE — Assessment & Plan Note (Signed)
Rechecking levels today. Await results. Treat as needed.  

## 2019-10-12 NOTE — Assessment & Plan Note (Signed)
Has been getting worse. Granddaughter is living with her and noticing it a lot. Will get her into neurology for evaluation and check labs. Call with any concerns.

## 2019-10-18 ENCOUNTER — Telehealth: Payer: Federal, State, Local not specified - PPO | Admitting: Family Medicine

## 2019-10-19 ENCOUNTER — Ambulatory Visit: Payer: Federal, State, Local not specified - PPO | Admitting: Family Medicine

## 2019-10-26 ENCOUNTER — Encounter (INDEPENDENT_AMBULATORY_CARE_PROVIDER_SITE_OTHER): Payer: Self-pay | Admitting: Vascular Surgery

## 2019-10-26 ENCOUNTER — Telehealth (INDEPENDENT_AMBULATORY_CARE_PROVIDER_SITE_OTHER): Payer: Self-pay

## 2019-10-26 ENCOUNTER — Ambulatory Visit (INDEPENDENT_AMBULATORY_CARE_PROVIDER_SITE_OTHER): Payer: Federal, State, Local not specified - PPO | Admitting: Vascular Surgery

## 2019-10-26 ENCOUNTER — Other Ambulatory Visit: Payer: Self-pay

## 2019-10-26 VITALS — BP 229/93 | HR 75 | Ht 64.0 in | Wt 181.0 lb

## 2019-10-26 DIAGNOSIS — M792 Neuralgia and neuritis, unspecified: Secondary | ICD-10-CM

## 2019-10-26 DIAGNOSIS — E782 Mixed hyperlipidemia: Secondary | ICD-10-CM

## 2019-10-26 DIAGNOSIS — I10 Essential (primary) hypertension: Secondary | ICD-10-CM | POA: Diagnosis not present

## 2019-10-26 DIAGNOSIS — I890 Lymphedema, not elsewhere classified: Secondary | ICD-10-CM | POA: Diagnosis not present

## 2019-10-26 DIAGNOSIS — I70213 Atherosclerosis of native arteries of extremities with intermittent claudication, bilateral legs: Secondary | ICD-10-CM

## 2019-10-26 DIAGNOSIS — I70219 Atherosclerosis of native arteries of extremities with intermittent claudication, unspecified extremity: Secondary | ICD-10-CM | POA: Insufficient documentation

## 2019-10-26 NOTE — Assessment & Plan Note (Signed)
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  I have discussed that this could be from a result of limited perfusion at or before the time of her surgery, injury of the nerve at the time of the surgery, or it may not be clear what the cause.  This seems to be slowly improving.  She has failed Neurontin therapy and asks if there are any other medications that can be used.  I given her prescription for Lyrica today to try for her neuropathic pain.

## 2019-10-26 NOTE — Assessment & Plan Note (Signed)
The patient has developed lymphedema after her femoral to popliteal bypass.  I discussed with the patient this was not for many fault of the surgeon and does happen after some surgeries particularly vascular surgery of the right lower extremity.  I discussed this is likely to be a chronic condition and is best managed with compression stockings, elevation, and increasing her activity.  A lymphedema pump would be an excellent adjuvant therapy to improve her swelling and hopefully help with her pain.  We can work on getting this approved after we check her noninvasive studies.

## 2019-10-26 NOTE — Progress Notes (Signed)
Patient ID: Tara [name], female   DOB: Nov 16, 1945, 74 y.o.   MRN: [id number]  Chief Complaint  Patient presents with   New Patient (Initial Visit)    PAD    HPI Tara [name] is a 74 y.o. female.  I am asked to see the patient by Dr. Josefa Half for evaluation of her PAD and persistent problems in her right leg.  She underwent bypass surgery on the right leg 3 to 4 months ago, but has had numbness and disabling pain in the right foot as well as marked swelling in the right leg since that time.  She basically says that her foot, ankle, and lower leg were "dead" for about 6 to 8 weeks after surgery.  She still has severe numbness in her foot and lower leg.  There is some drawing of the toes.  The swelling has been prominent since surgery and has not resolved.  In discussions with the patient, it sounds like she had disabling claudication symptoms and that was the reason for surgery initially.  She was also told she had a blockage in the left leg that would need to be addressed when she heals from her surgery as she had pretty significant claudication symptoms on the left as well.  No open wounds or infection.  She has a long history of atherosclerotic disease including cardiac disease.  She says currently her left leg claudication is not limiting her as much as her disabling problems in her right leg, but prior to her surgery she was only able to walk distances of less than 100 feet due to pain in both calves.   Past Medical History:  Diagnosis Date   Anxiety    Arthritis    Barrett's syndrome    Depression    GERD (gastroesophageal reflux disease)    Heart murmur    Hyperlipidemia    Hypertension    [facility]ocardial infarction [facility])    08   PAD (peripheral artery disease) (HCC)     Past Surgical History:  Procedure Laterality Date   ABDOMINAL AORTOGRAM W/LOWER EXTREMITY N/A 07/28/2019   Procedure: ABDOMINAL AORTOGRAM W/LOWER EXTREMITY;  Surgeon: Iran Ouch,  MD;  [facility]: [facility];  Service: Cardiovascular;  Laterality: N/A;   ABDOMINAL HYSTERECTOMY     ARTERY BIOPSY Right 07/12/2019   Procedure: BIOPSY TEMPORAL ARTERY;  Surgeon: Henrene Dodge, MD;  Location: [facility] ORS;  Service: General;  Laterality: Right;   BREAST BIOPSY Left 2007   Neg- [place]   CARDIAC CATHETERIZATION     CARDIAC VALVE REPLACEMENT     CHOLECYSTECTOMY     COLON SURGERY     COLONOSCOPY WITH PROPOFOL N/A 04/09/2019   Procedure: COLONOSCOPY WITH PROPOFOL;  Surgeon: Pasty Spillers, MD;  Location: [facility] ENDOSCOPY;  Service: Endoscopy;  Laterality: N/A;   CORONARY ANGIOPLASTY WITH STENT PLACEMENT  2008   [place]; [facility] Stent placement x 1    DENTAL SURGERY     ESOPHAGOGASTRODUODENOSCOPY (EGD) WITH PROPOFOL N/A 04/09/2019   Procedure: ESOPHAGOGASTRODUODENOSCOPY (EGD) WITH PROPOFOL;  Surgeon: Pasty Spillers, MD;  Location: [facility] ENDOSCOPY;  Service: Endoscopy;  Laterality: N/A;   EXCISIONAL HEMORRHOIDECTOMY     EYE SURGERY     FEMORAL-POPLITEAL BYPASS GRAFT Right 08/02/2019   Procedure: BYPASS GRAFT FEMORAL-POPLITEAL ARTERY;  Surgeon: Larina Earthly, MD;  Location: [facility] OR;  Service: Vascular;  Laterality: Right;   HERNIA REPAIR     MULTIPLE TOOTH EXTRACTIONS  TONSILLECTOMY       Family History  Adopted: Yes  Problem Relation Age of Onset   Breast cancer Mother 46  No known history of clotting disorders, bleeding disorders, or aneurysms  Social History   Tobacco Use   Smoking status: Light Tobacco Smoker    Packs/day: 0.25    Years: 3.00    Pack years: 0.75    Types: Cigarettes   Smokeless tobacco: Never Used   Tobacco comment: "  3 cigarettes per day "  Substance Use Topics   Alcohol use: Never   Drug use: Never     No Known Allergies  Current Outpatient Medications  Medication Sig Dispense Refill   ALPRAZolam (XANAX) 0.5 MG tablet Take 1 tablet (0.5 mg total) by mouth daily as needed for anxiety.  30 tablet 0   aspirin EC 325 MG tablet Take 325 mg by mouth daily.      atorvastatin (LIPITOR) 40 MG tablet Take 40 mg by mouth daily.     citalopram (CELEXA) 10 MG tablet TAKE 1 TABLET(10 MG) BY MOUTH TWICE DAILY 60 tablet 3   cyanocobalamin (,VITAMIN B-12,) 1000 MCG/ML injection Inject 1 mL (1,000 mcg total) into the muscle every 30 (thirty) days. 1 mL 12   diclofenac Sodium (VOLTAREN) 1 % GEL APPLY 4 GRAMS EXTERNALLY TO THE AFFECTED AREA FOUR TIMES DAILY 100 g 0   famotidine (PEPCID) 20 MG tablet Take 1 tablet (20 mg total) by mouth at bedtime. (Patient taking differently: Take 20 mg by mouth at bedtime as needed for heartburn or indigestion. ) 30 tablet 3   fenofibrate micronized (LOFIBRA) 200 MG capsule TAKE 1 CAPSULE(200 MG) BY MOUTH DAILY 90 capsule 1   gabapentin (NEURONTIN) 100 MG capsule 1 cap in AM, 1 cap in PM, 2 caps QHS 120 capsule 3   hydrochlorothiazide (HYDRODIURIL) 25 MG tablet TAKE 1 TABLET(25 MG) BY MOUTH DAILY (Patient taking differently: Take 25 mg by mouth daily. ) 90 tablet 0   HYDROcodone-acetaminophen (NORCO/VICODIN) 5-325 MG tablet Take 1 tablet by mouth every 6 (six) hours as needed for moderate pain or severe pain. 25 tablet 0   Insulin Syringe 27G X 1/2" 1 ML MISC 1 each by Does not apply route every 30 (thirty) days. 10 each 3   isosorbide mononitrate (IMDUR) 30 MG 24 hr tablet Take 60 mg (2 tablets) in the morning and  (1 tablet) in the evening. 270 tablet 1   lisinopril (ZESTRIL) 40 MG tablet Take 1 tablet (40 mg total) by mouth 2 (two) times daily. 180 tablet 1   loperamide (IMODIUM) 2 MG capsule Take 2 mg by mouth as needed for diarrhea or loose stools.     nitroGLYCERIN (NITROSTAT) 0.4 MG SL tablet Place 0.4 mg under the tongue every 5 (five) minutes as needed for chest pain.      nortriptyline (PAMELOR) 50 MG capsule TAKE 1 CAPSULE(50 MG) BY MOUTH AT BEDTIME 30 capsule 3   omeprazole (PRILOSEC) 40 MG capsule TAKE 1 CAPSULE BY MOUTH TWICE  DAILY 180 capsule 1   ondansetron (ZOFRAN) 4 MG tablet TK 1 T PO  Q 6 H PRN (Patient taking differently: Take 4 mg by mouth every 6 (six) hours as needed for nausea or vomiting. ) 60 tablet 2   amLODipine (NORVASC) 10 MG tablet Take 10 mg by mouth daily.     Current Facility-Administered Medications  Medication Dose Route Frequency Provider Last Rate Last Admin   cyanocobalamin ((VITAMIN B-12)) injection 1,000 mcg  1,000 mcg Intramuscular Weekly Johnson, Megan P, DO       [START ON 11/12/2019] cyanocobalamin ((VITAMIN B-12)) injection 1,000 mcg  1,000 mcg Intramuscular Q30 days Johnson, Megan P, DO          REVIEW OF SYSTEMS (Negative unless checked)  Constitutional: [] Weight loss  [] Fever  [] Chills Cardiac: [] Chest pain   [] Chest pressure   [] Palpitations   [] Shortness of breath when laying flat   [] Shortness of breath at rest   [] Shortness of breath with exertion. Vascular:  [x] Pain in legs with walking   [] Pain in legs at rest   [] Pain in legs when laying flat   [x] Claudication   [] Pain in feet when walking  [] Pain in feet at rest  [] Pain in feet when laying flat   [] History of DVT   [] Phlebitis   [x] Swelling in legs   [] Varicose veins   [] Non-healing ulcers Pulmonary:   [] Uses home oxygen   [] Productive cough   [] Hemoptysis   [] Wheeze  [] COPD   [] Asthma Neurologic:  [] Dizziness  [] Blackouts   [] Seizures   [] History of stroke   [] History of TIA  [] Aphasia   [] Temporary blindness   [] Dysphagia   [] Weakness or numbness in arms   [x] Weakness or numbness in legs Musculoskeletal:  [x] Arthritis   [] Joint swelling   [] Joint pain   [] Low back pain Hematologic:  [] Easy bruising  [] Easy bleeding   [] Hypercoagulable state   [] Anemic  [] Hepatitis Gastrointestinal:  [] Blood in stool   [] Vomiting blood  [x] Gastroesophageal reflux/heartburn   [] Abdominal pain Genitourinary:  [] Chronic kidney disease   [] Difficult urination  [] Frequent urination  [] Burning with urination   [] Hematuria Skin:  [] Rashes    [] Ulcers   [] Wounds Psychological:  [x] History of anxiety   []  History of major depression.    Physical Exam BP (!) 229/93    Pulse 75    Ht 5\' 4"  (1.626 m)    Wt 181 lb (82.1 kg)    BMI 31.07 kg/m  Gen:  WD/WN, NAD Head: [place]/AT, No temporalis wasting.  Ear/Nose/Throat: Hearing grossly intact, nares w/o erythema or drainage, oropharynx w/o Erythema/Exudate Eyes: Conjunctiva clear, sclera non-icteric  Neck: trachea midline.  No JVD.  Pulmonary:  Good air movement, respirations not labored, no use of accessory muscles  Cardiac: RRR, no JVD Vascular:  Vessel Right Left  Radial Palpable Palpable                          DP  1+  trace  PT  2+  1+   Gastrointestinal:. No masses, surgical incisions, or scars. Musculoskeletal: M/S 5/5 throughout.  Extremities without ischemic changes.  No deformity or atrophy.  She does have numbness reduced sensation in her lower leg, foot and ankle on the right.  2+ right lower extremity edema. Neurologic: Sensation grossly intact in extremities.  Symmetrical.  Speech is fluent. Motor exam as listed above. Psychiatric: Judgment intact, Mood & affect appropriate for pt's clinical situation. Dermatologic: No rashes or ulcers noted.  No cellulitis or open wounds.    Radiology No results found.  Labs Recent Results (from the past 2160 hour(s))  SARS CORONAVIRUS 2 (TAT 6-24 HRS) Nasopharyngeal Nasopharyngeal Swab     Status: None   Collection Time: 07/29/19  2:20 PM   Specimen: Nasopharyngeal Swab  Result Value Ref Range   SARS Coronavirus 2 NEGATIVE NEGATIVE    Comment: (NOTE) SARS-CoV-2 target nucleic acids are NOT DETECTED. The SARS-CoV-2 RNA is generally detectable  in upper and lower respiratory specimens during the acute phase of infection. Negative results do not preclude SARS-CoV-2 infection, do not rule out co-infections with other pathogens, and should not be used as the sole basis for treatment or other patient management  decisions. Negative results must be combined with clinical observations, patient history, and epidemiological information. The expected result is Negative. Fact Sheet for Patients: HairSlick.no Fact Sheet for Healthcare Providers: quierodirigir.com This test is not yet approved or cleared by the [place] FDA and  has been authorized for detection and/or diagnosis of SARS-CoV-2 by FDA under an Emergency Use Authorization (EUA). This EUA will remain  in effect (meaning this test can be used) for the duration of the COVID-19 declaration under Section 56 4(b)(1) of the Act, 21 U.S.C. section 360bbb-3(b)(1), unless the authorization is terminated or revoked sooner. Performed at [facility], [address] [place], [place] [address]   Urinalysis, Routine w reflex microscopic     Status: Abnormal   Collection Time: 08/02/19  8:36 AM  Result Value Ref Range   Color, Urine YELLOW YELLOW   APPearance HAZY (A) CLEAR   Specific Gravity, Urine 1.026 1.005 - 1.030   pH 5.0 5.0 - 8.0   Glucose, UA NEGATIVE NEGATIVE mg/dL   Hgb urine dipstick NEGATIVE NEGATIVE   Bilirubin Urine MODERATE (A) NEGATIVE   Ketones, ur NEGATIVE NEGATIVE mg/dL   Protein, ur 604 (A) NEGATIVE mg/dL   Nitrite NEGATIVE NEGATIVE   Leukocytes,Ua NEGATIVE NEGATIVE   RBC / HPF 0-5 0 - 5 RBC/hpf   WBC, UA 6-10 0 - 5 WBC/hpf   Bacteria, UA RARE (A) NONE SEEN   Squamous Epithelial / LPF 11-20 0 - 5   Mucus PRESENT    Hyaline Casts, UA PRESENT     Comment: Performed at [facility], [address], [place], [place] [address]  Protime-INR     Status: None   Collection Time: 08/02/19  8:41 AM  Result Value Ref Range   Prothrombin Time 12.7 11.4 - 15.2 seconds   INR 1.0 0.8 - 1.2    Comment: (NOTE) INR goal varies based on device and disease states. Performed at [facility], [address] [place], [place] [address]   APTT     Status: None    Collection Time: 08/02/19  8:41 AM  Result Value Ref Range   aPTT 27 24 - 36 seconds    Comment: Performed at [facility], [address], [place], [place] [address]  Surgical pcr screen     Status: None   Collection Time: 08/02/19  8:41 AM   Specimen: Nasal Mucosa; Nasal Swab  Result Value Ref Range   MRSA, PCR NEGATIVE NEGATIVE   Staphylococcus aureus NEGATIVE NEGATIVE    Comment: (NOTE) The Xpert SA Assay (FDA approved for NASAL specimens in patients 6 years of age and older), is one component of a comprehensive surveillance program. It is not intended to diagnose infection nor to guide or monitor treatment. Performed at [facility], [address] [place], [place] [address]   Type and screen     Status: None   Collection Time: 08/02/19  9:40 AM  Result Value Ref Range   ABO/RH(D) A POS    Antibody Screen NEG    Sample Expiration      08/05/2019,2359 Performed at [facility], [address] [place], [place] [address]   ABO/Rh     Status:  None   Collection Time: 08/02/19  9:40 AM  Result Value Ref Range   ABO/RH(D)      A POS Performed at [facility], [address], [place], [place] [address]   Glucose, capillary     Status: Abnormal   Collection Time: 08/02/19  8:57 PM  Result Value Ref Range   Glucose-Capillary 153 (H) 70 - 99 mg/dL  Glucose, capillary     Status: Abnormal   Collection Time: 08/03/19  5:55 AM  Result Value Ref Range   Glucose-Capillary 150 (H) 70 - 99 mg/dL  CBC     Status: None   Collection Time: 08/03/19  6:17 AM  Result Value Ref Range   WBC 9.4 4.0 - 10.5 K/uL   RBC 4.51 3.87 - 5.11 MIL/uL   Hemoglobin 13.2 12.0 - 15.0 g/dL   HCT 27.7 41.2 - 87.8 %   MCV 90.0 80.0 - 100.0 fL   MCH 29.3 26.0 - 34.0 pg   MCHC 32.5 30.0 - 36.0 g/dL   RDW 67.6 72.0 - 94.7 %   Platelets 195 150 - 400 K/uL   nRBC 0.0 0.0 - 0.2 %    Comment: Performed at [facility], [address], [place], [place] [address]  Basic  metabolic panel     Status: Abnormal   Collection Time: 08/03/19  6:17 AM  Result Value Ref Range   Sodium 140 135 - 145 mmol/L   Potassium 3.9 3.5 - 5.1 mmol/L   Chloride 109 98 - 111 mmol/L   CO2 23 22 - 32 mmol/L   Glucose, Bld 163 (H) 70 - 99 mg/dL   BUN 19 8 - 23 mg/dL   Creatinine, Ser 3.66 0.44 - 1.00 mg/dL   Calcium 8.3 (L) 8.9 - 10.3 mg/dL   GFR calc non Af Amer >60 >60 mL/min   GFR calc Af Amer >60 >60 mL/min   Anion gap 8 5 - 15    Comment: Performed at [facility], [address], [place], [place] [address]  Hemoglobin A1c     Status: Abnormal   Collection Time: 08/03/19  6:17 AM  Result Value Ref Range   Hgb A1c MFr Bld 6.3 (H) 4.8 - 5.6 %    Comment: (NOTE) Pre diabetes:          5.7%-6.4% Diabetes:              >6.4% Glycemic control for   <7.0% adults with diabetes    Mean Plasma Glucose 134.11 mg/dL    Comment: Performed at [facility], [address], [place], [place] [address]  Glucose, capillary     Status: Abnormal   Collection Time: 08/03/19 11:16 AM  Result Value Ref Range   Glucose-Capillary 124 (H) 70 - 99 mg/dL   Comment 1 Notify RN   Glucose, capillary     Status: Abnormal   Collection Time: 08/03/19  4:18 PM  Result Value Ref Range   Glucose-Capillary 161 (H) 70 - 99 mg/dL   Comment 1 Notify RN   Glucose, capillary     Status: Abnormal   Collection Time: 08/03/19  9:39 PM  Result Value Ref Range   Glucose-Capillary 139 (H) 70 - 99 mg/dL  Glucose, capillary     Status: Abnormal   Collection Time: 08/04/19  6:36 AM  Result Value Ref Range   Glucose-Capillary 115 (H) 70 - 99 mg/dL  Bayer DCA Hb P5W Waived  Status: None   Collection Time: 10/01/19  4:01 PM  Result Value Ref Range   HB A1C (BAYER DCA - WAIVED) 5.8 <7.0 %    Comment:                                       Diabetic Adult            <7.0                                       Healthy Adult        4.3 - 5.7                                                            (DCCT/NGSP) American Diabetes Association's Summary of Glycemic Recommendations for Adults with Diabetes: Hemoglobin A1c <7.0%. More stringent glycemic goals (A1c <6.0%) may further reduce complications at the cost of increased risk of hypoglycemia.   CBC with Differential/Platelet     Status: Abnormal   Collection Time: 10/01/19  4:03 PM  Result Value Ref Range   WBC 7.0 3.4 - 10.8 x10E3/uL   RBC 5.43 (H) 3.77 - 5.28 x10E6/uL   Hemoglobin 15.4 11.1 - 15.9 g/dL   Hematocrit 40.9 81.1 - 46.6 %   MCV 85 79 - 97 fL   MCH 28.4 26.6 - 33.0 pg   MCHC 33.3 31.5 - 35.7 g/dL   RDW 91.4 78.2 - 95.6 %   Platelets 207 150 - 450 x10E3/uL   Neutrophils 69 Not Estab. %   Lymphs 24 Not Estab. %   Monocytes 6 Not Estab. %   Eos 1 Not Estab. %   Basos 0 Not Estab. %   Neutrophils Absolute 4.8 1.4 - 7.0 x10E3/uL   Lymphocytes Absolute 1.7 0.7 - 3.1 x10E3/uL   Monocytes Absolute 0.4 0.1 - 0.9 x10E3/uL   EOS (ABSOLUTE) 0.1 0.0 - 0.4 x10E3/uL   Basophils Absolute 0.0 0.0 - 0.2 x10E3/uL   Immature Granulocytes 0 Not Estab. %   Immature Grans (Abs) 0.0 0.0 - 0.1 x10E3/uL  Comprehensive metabolic panel     Status: Abnormal   Collection Time: 10/01/19  4:03 PM  Result Value Ref Range   Glucose 118 (H) 65 - 99 mg/dL   BUN 21 8 - 27 mg/dL   Creatinine, Ser 2.13 0.57 - 1.00 mg/dL   GFR calc non Af Amer 82 >59 mL/min/1.73   GFR calc Af Amer 94 >59 mL/min/1.73   BUN/Creatinine Ratio 28 12 - 28   Sodium 142 134 - 144 mmol/L   Potassium 4.6 3.5 - 5.2 mmol/L   Chloride 105 96 - 106 mmol/L   CO2 23 20 - 29 mmol/L   Calcium 10.3 8.7 - 10.3 mg/dL   Total Protein 6.9 6.0 - 8.5 g/dL   Albumin 4.2 3.7 - 4.7 g/dL   Globulin, Total 2.7 1.5 - 4.5 g/dL   Albumin/Globulin Ratio 1.6 1.2 - 2.2   Bilirubin Total 0.3 0.0 - 1.2 mg/dL   Alkaline Phosphatase 117 39 - 117 IU/L   AST 17 0 - 40 IU/L   ALT 9 0 -  32 IU/L  Lipid Panel w/o Chol/HDL Ratio     Status: Abnormal   Collection Time: 10/01/19  4:03 PM  Result  Value Ref Range   Cholesterol, Total 210 (H) 100 - 199 mg/dL   Triglycerides 172 (H) 0 - 149 mg/dL   HDL 47 >39 mg/dL   VLDL Cholesterol Cal 31 5 - 40 mg/dL   LDL Chol Calc (NIH) 132 (H) 0 - 99 mg/dL  TSH     Status: None   Collection Time: 10/01/19  4:03 PM  Result Value Ref Range   TSH 2.500 0.450 - 4.500 uIU/mL  B12     Status: Abnormal   Collection Time: 10/01/19  4:03 PM  Result Value Ref Range   Vitamin B-12 141 (L) 232 - 1,245 pg/mL    Assessment/Plan:  HTN (hypertension) blood pressure control important in reducing the progression of atherosclerotic disease. On appropriate oral medications.   Hyperlipidemia lipid control important in reducing the progression of atherosclerotic disease. Continue statin therapy   Lymphedema The patient has developed lymphedema after her femoral to popliteal bypass.  I discussed with the patient this was not for many fault of the surgeon and does happen after some surgeries particularly vascular surgery of the right lower extremity.  I discussed this is likely to be a chronic condition and is best managed with compression stockings, elevation, and increasing her activity.  A lymphedema pump would be an excellent adjuvant therapy to improve her swelling and hopefully help with her pain.  We can work on getting this approved after we check her noninvasive studies.  Nerve pain Patient appears to have significant neuropathic pain of the right leg after her bypass surgery.  I have discussed that this could be from a result of limited perfusion at or before the time of her surgery, injury of the nerve at the time of the surgery, or it may not be clear what the cause.  This seems to be slowly improving.  She has failed Neurontin therapy and asks if there are any other medications that can be used.  I given her prescription for Lyrica today to try for her neuropathic pain.  Atherosclerosis of native arteries of extremity with intermittent claudication  (HCC) The patient has severe peripheral arterial disease and had disabling claudication symptoms bilaterally.  She underwent a right femoral to popliteal bypass primarily for treatment of her right lower extremity ischemia.  Although she appears to have pulses in her feet, I think an arterial duplex and ABIs would be prudent as it has been about 3 months since her surgery and she continues to have pain in that leg.  I think most of her pain at this point is neuropathic in nature. As for her left leg symptoms, after we get her noninvasive studies I have discussed with her treatment options for her disabling claudication of the left leg.  I have told her that I would generally favor an attempted endovascular revascularization first if the anatomy is acceptable for this.  From her previous angiogram, that would certainly appear to be the case.  I discussed the risks and benefits of angiography and possible revascularization.  The patient voices her understanding and his desire to proceed with this once we have completed her noninvasive studies of both lower extremities.  I discussed that she does not have any immediate limb threatening symptoms on the left leg.  All in all, the patient is a complex patient with multiple ongoing issues.  She has neuropathic  pain after surgery, lymphedema, and continued atherosclerotic occlusive disease with disabling claudication of the left leg.  We will do our best to address all 3 issues although the treatment options are obviously very different for each problem.    Festus Barren 10/26/2019, 1:02 PM   This note was created with Dragon medical transcription system.  Any errors from dictation are unintentional.

## 2019-10-26 NOTE — Assessment & Plan Note (Signed)
The patient has severe peripheral arterial disease and had disabling claudication symptoms bilaterally.  She underwent a right femoral to popliteal bypass primarily for treatment of her right lower extremity ischemia.  Although she appears to have pulses in her feet, I think an arterial duplex and ABIs would be prudent as it has been about 3 months since her surgery and she continues to have pain in that leg.  I think most of her pain at this point is neuropathic in nature. As for her left leg symptoms, after we get her noninvasive studies I have discussed with her treatment options for her disabling claudication of the left leg.  I have told her that I would generally favor an attempted endovascular revascularization first if the anatomy is acceptable for this.  From her previous angiogram, that would certainly appear to be the case.  I discussed the risks and benefits of angiography and possible revascularization.  The patient voices her understanding and his desire to proceed with this once we have completed her noninvasive studies of both lower extremities.  I discussed that she does not have any immediate limb threatening symptoms on the left leg.

## 2019-10-26 NOTE — Assessment & Plan Note (Signed)
lipid control important in reducing the progression of atherosclerotic disease. Continue statin therapy  

## 2019-10-26 NOTE — Assessment & Plan Note (Signed)
blood pressure control important in reducing the progression of atherosclerotic disease. On appropriate oral medications.  

## 2019-10-26 NOTE — Telephone Encounter (Signed)
I attempted to contact the patient and a message was left for a return call. 

## 2019-10-26 NOTE — Patient Instructions (Signed)
Peripheral Vascular Disease  Peripheral vascular disease (PVD) is a disease of the blood vessels that are not part of your heart and brain. A simple term for PVD is poor circulation. In most cases, PVD narrows the blood vessels that carry blood from your heart to the rest of your body. This can reduce the supply of blood to your arms, legs, and internal organs, like your stomach or kidneys. However, PVD most often affects a person's lower legs and feet. Without treatment, PVD tends to get worse. PVD can also lead to acute ischemic limb. This is when an arm or leg suddenly cannot get enough blood. This is a medical emergency. Follow these instructions at home: Lifestyle  Do not use any products that contain nicotine or tobacco, such as cigarettes and e-cigarettes. If you need help quitting, ask your doctor.  Lose weight if you are overweight. Or, stay at a healthy weight as told by your doctor.  Eat a diet that is low in fat and cholesterol. If you need help, ask your doctor.  Exercise regularly. Ask your doctor for activities that are right for you. General instructions  Take over-the-counter and prescription medicines only as told by your doctor.  Take good care of your feet: ? Wear comfortable shoes that fit well. ? Check your feet often for any cuts or sores.  Keep all follow-up visits as told by your doctor This is important. Contact a doctor if:  You have cramps in your legs when you walk.  You have leg pain when you are at rest.  You have coldness in a leg or foot.  Your skin changes.  You are unable to get or have an erection (erectile dysfunction).  You have cuts or sores on your feet that do not heal. Get help right away if:  Your arm or leg turns cold, numb, and blue.  Your arms or legs become red, warm, swollen, painful, or numb.  You have chest pain.  You have trouble breathing.  You suddenly have weakness in your face, arm, or leg.  You become very  confused or you cannot speak.  You suddenly have a very bad headache.  You suddenly cannot see. Summary  Peripheral vascular disease (PVD) is a disease of the blood vessels.  A simple term for PVD is poor circulation. Without treatment, PVD tends to get worse.  Treatment may include exercise, low fat and low cholesterol diet, and quitting smoking. This information is not intended to replace advice given to you by your health care provider. Make sure you discuss any questions you have with your health care provider. Document Revised: 06/20/2017 Document Reviewed: 08/15/2016 Elsevier Patient Education  2020 Elsevier Inc.  

## 2019-10-29 DIAGNOSIS — R200 Anesthesia of skin: Secondary | ICD-10-CM | POA: Insufficient documentation

## 2019-10-29 DIAGNOSIS — G479 Sleep disorder, unspecified: Secondary | ICD-10-CM | POA: Insufficient documentation

## 2019-10-29 DIAGNOSIS — G3184 Mild cognitive impairment, so stated: Secondary | ICD-10-CM | POA: Insufficient documentation

## 2019-11-01 ENCOUNTER — Telehealth (INDEPENDENT_AMBULATORY_CARE_PROVIDER_SITE_OTHER): Payer: Self-pay

## 2019-11-01 ENCOUNTER — Encounter (INDEPENDENT_AMBULATORY_CARE_PROVIDER_SITE_OTHER): Payer: Self-pay

## 2019-11-01 NOTE — Telephone Encounter (Signed)
I attempted to contact the patient regarding being scheduled for a leg angio and a message was left for a return call.

## 2019-11-01 NOTE — Telephone Encounter (Signed)
Spoke with the patient and she is now scheduled with Dr. Wyn Quaker for a Left leg angio on 11/11/19 with a 6:45 am arrival time to [facility]. Patient will do covid testing on 11/09/19 between 8-1 pm at the [facility]. Pre-procedure instructions were discussed and will be mailed to the patient. Patient did question whether the left leg is the leg Dr. Wyn Quaker was actually going to do an angio on because she has trouble with her right leg as well. I explained that per his note the left leg is the leg for an angio, patient wanted me to clarify anyway.

## 2019-11-04 ENCOUNTER — Other Ambulatory Visit: Payer: Self-pay | Admitting: Family Medicine

## 2019-11-08 ENCOUNTER — Ambulatory Visit (INDEPENDENT_AMBULATORY_CARE_PROVIDER_SITE_OTHER): Payer: Federal, State, Local not specified - PPO

## 2019-11-08 ENCOUNTER — Ambulatory Visit: Payer: Self-pay | Admitting: Family Medicine

## 2019-11-08 ENCOUNTER — Other Ambulatory Visit (INDEPENDENT_AMBULATORY_CARE_PROVIDER_SITE_OTHER): Payer: Self-pay | Admitting: Vascular Surgery

## 2019-11-08 ENCOUNTER — Encounter (INDEPENDENT_AMBULATORY_CARE_PROVIDER_SITE_OTHER): Payer: Self-pay

## 2019-11-08 ENCOUNTER — Ambulatory Visit (INDEPENDENT_AMBULATORY_CARE_PROVIDER_SITE_OTHER): Payer: Federal, State, Local not specified - PPO | Admitting: Nurse Practitioner

## 2019-11-08 ENCOUNTER — Other Ambulatory Visit: Payer: Self-pay

## 2019-11-08 DIAGNOSIS — I70213 Atherosclerosis of native arteries of extremities with intermittent claudication, bilateral legs: Secondary | ICD-10-CM

## 2019-11-08 DIAGNOSIS — Z95828 Presence of other vascular implants and grafts: Secondary | ICD-10-CM | POA: Diagnosis not present

## 2019-11-09 ENCOUNTER — Other Ambulatory Visit
Admission: RE | Admit: 2019-11-09 | Discharge: 2019-11-09 | Disposition: A | Discharge status: Home / Self Care | Payer: Federal, State, Local not specified - PPO | Source: Ambulatory Visit | Attending: Vascular Surgery | Admitting: Vascular Surgery

## 2019-11-09 DIAGNOSIS — Z01812 Encounter for preprocedural laboratory examination: Secondary | ICD-10-CM | POA: Insufficient documentation

## 2019-11-09 DIAGNOSIS — Z20822 Contact with and (suspected) exposure to covid-19: Secondary | ICD-10-CM | POA: Insufficient documentation

## 2019-11-10 ENCOUNTER — Other Ambulatory Visit (INDEPENDENT_AMBULATORY_CARE_PROVIDER_SITE_OTHER): Payer: Self-pay | Admitting: Nurse Practitioner

## 2019-11-10 ENCOUNTER — Other Ambulatory Visit: Payer: Federal, State, Local not specified - PPO

## 2019-11-10 LAB — SARS CORONAVIRUS 2 (TAT 6-24 HRS): SARS Coronavirus 2: NEGATIVE

## 2019-11-11 ENCOUNTER — Encounter: Payer: Self-pay | Admitting: Vascular Surgery

## 2019-11-11 ENCOUNTER — Ambulatory Visit
Admission: RE | Admit: 2019-11-11 | Discharge: 2019-11-11 | Disposition: A | Discharge status: Home / Self Care | Payer: Federal, State, Local not specified - PPO | Attending: Vascular Surgery | Admitting: Vascular Surgery

## 2019-11-11 ENCOUNTER — Encounter
Admission: RE | Disposition: A | Discharge status: Home / Self Care | Payer: Self-pay | Source: Home / Self Care | Attending: Vascular Surgery

## 2019-11-11 ENCOUNTER — Other Ambulatory Visit: Payer: Self-pay

## 2019-11-11 DIAGNOSIS — I890 Lymphedema, not elsewhere classified: Secondary | ICD-10-CM | POA: Diagnosis not present

## 2019-11-11 DIAGNOSIS — I252 Old myocardial infarction: Secondary | ICD-10-CM | POA: Insufficient documentation

## 2019-11-11 DIAGNOSIS — Z79899 Other long term (current) drug therapy: Secondary | ICD-10-CM | POA: Insufficient documentation

## 2019-11-11 DIAGNOSIS — F17210 Nicotine dependence, cigarettes, uncomplicated: Secondary | ICD-10-CM | POA: Insufficient documentation

## 2019-11-11 DIAGNOSIS — I70212 Atherosclerosis of native arteries of extremities with intermittent claudication, left leg: Secondary | ICD-10-CM | POA: Diagnosis not present

## 2019-11-11 DIAGNOSIS — K219 Gastro-esophageal reflux disease without esophagitis: Secondary | ICD-10-CM | POA: Insufficient documentation

## 2019-11-11 DIAGNOSIS — I10 Essential (primary) hypertension: Secondary | ICD-10-CM | POA: Diagnosis not present

## 2019-11-11 DIAGNOSIS — F329 Major depressive disorder, single episode, unspecified: Secondary | ICD-10-CM | POA: Diagnosis not present

## 2019-11-11 DIAGNOSIS — G629 Polyneuropathy, unspecified: Secondary | ICD-10-CM | POA: Insufficient documentation

## 2019-11-11 DIAGNOSIS — Z794 Long term (current) use of insulin: Secondary | ICD-10-CM | POA: Diagnosis not present

## 2019-11-11 DIAGNOSIS — Z7982 Long term (current) use of aspirin: Secondary | ICD-10-CM | POA: Insufficient documentation

## 2019-11-11 DIAGNOSIS — F419 Anxiety disorder, unspecified: Secondary | ICD-10-CM | POA: Insufficient documentation

## 2019-11-11 DIAGNOSIS — E785 Hyperlipidemia, unspecified: Secondary | ICD-10-CM | POA: Insufficient documentation

## 2019-11-11 DIAGNOSIS — I70219 Atherosclerosis of native arteries of extremities with intermittent claudication, unspecified extremity: Secondary | ICD-10-CM

## 2019-11-11 HISTORY — PX: LOWER EXTREMITY ANGIOGRAPHY: CATH118251

## 2019-11-11 HISTORY — DX: Carpal tunnel syndrome, unspecified upper limb: G56.00

## 2019-11-11 LAB — BUN: BUN: 30 mg/dL — ABNORMAL HIGH (ref 8–23)

## 2019-11-11 LAB — CREATININE, SERUM
Creatinine, Ser: 0.72 mg/dL (ref 0.44–1.00)
GFR calc Af Amer: >60 mL/min (ref >60)
GFR calc non Af Amer: >60 mL/min (ref >60)

## 2019-11-11 SURGERY — LOWER EXTREMITY ANGIOGRAPHY
Anesthesia: Moderate Sedation | Laterality: Left

## 2019-11-11 MED ORDER — CLOPIDOGREL BISULFATE 75 MG PO TABS
ORAL_TABLET | ORAL | Status: AC
  Administered 2019-11-11: 150 mg via ORAL
  Filled 2019-11-11: qty 2

## 2019-11-11 MED ORDER — MIDAZOLAM HCL 2 MG/ML PO SYRP
8.0000 mg | ORAL_SOLUTION | Freq: Once | ORAL | Status: AC | PRN
  Administered 2019-11-11: 8 mg via ORAL

## 2019-11-11 MED ORDER — ASPIRIN 81 MG PO CHEW: 81.0000 mg | CHEWABLE_TABLET | Freq: Once | ORAL | Status: AC

## 2019-11-11 MED ORDER — CEFAZOLIN SODIUM-DEXTROSE 2-4 GM/100ML-% IV SOLN
2.0000 g | Freq: Once | INTRAVENOUS | Status: AC
  Administered 2019-11-11: 08:00:00 2 g via INTRAVENOUS

## 2019-11-11 MED ORDER — FAMOTIDINE 20 MG PO TABS: 40.0000 mg | ORAL_TABLET | Freq: Once | ORAL | Status: DC | PRN

## 2019-11-11 MED ORDER — HEPARIN SODIUM (PORCINE) 1000 UNIT/ML IJ SOLN
INTRAMUSCULAR | Status: AC
  Filled 2019-11-11: qty 1

## 2019-11-11 MED ORDER — CEFAZOLIN SODIUM-DEXTROSE 2-4 GM/100ML-% IV SOLN
INTRAVENOUS | Status: AC
  Filled 2019-11-11: qty 100

## 2019-11-11 MED ORDER — FENTANYL CITRATE (PF) 100 MCG/2ML IJ SOLN
INTRAMUSCULAR | Status: AC
  Filled 2019-11-11: qty 2

## 2019-11-11 MED ORDER — DIPHENHYDRAMINE HCL 50 MG/ML IJ SOLN: 50.0000 mg | Freq: Once | INTRAMUSCULAR | Status: DC | PRN

## 2019-11-11 MED ORDER — HEPARIN SODIUM (PORCINE) 1000 UNIT/ML IJ SOLN
INTRAMUSCULAR | Status: DC | PRN
  Administered 2019-11-11: 5000 [IU] via INTRAVENOUS

## 2019-11-11 MED ORDER — CLOPIDOGREL BISULFATE 75 MG PO TABS: 150.0000 mg | ORAL_TABLET | Freq: Once | ORAL | Status: AC

## 2019-11-11 MED ORDER — HYDROMORPHONE HCL 1 MG/ML IJ SOLN: 1.0000 mg | Freq: Once | INTRAMUSCULAR | Status: DC | PRN

## 2019-11-11 MED ORDER — MIDAZOLAM HCL 5 MG/5ML IJ SOLN
INTRAMUSCULAR | Status: AC
  Filled 2019-11-11: qty 5

## 2019-11-11 MED ORDER — MIDAZOLAM HCL 2 MG/ML PO SYRP
ORAL_SOLUTION | ORAL | Status: AC
  Filled 2019-11-11: qty 4

## 2019-11-11 MED ORDER — IODIXANOL 320 MG/ML IV SOLN
INTRAVENOUS | Status: DC | PRN
  Administered 2019-11-11: 50 mL via INTRA_ARTERIAL

## 2019-11-11 MED ORDER — METHYLPREDNISOLONE SODIUM SUCC 125 MG IJ SOLR: 125.0000 mg | Freq: Once | INTRAMUSCULAR | Status: DC | PRN

## 2019-11-11 MED ORDER — CLOPIDOGREL BISULFATE 75 MG PO TABS: 150.0000 mg | ORAL_TABLET | Freq: Once | ORAL | 0 refills | Status: AC

## 2019-11-11 MED ORDER — CLOPIDOGREL BISULFATE 75 MG PO TABS: 75.0000 mg | ORAL_TABLET | Freq: Every day | ORAL | 11 refills | Status: AC

## 2019-11-11 MED ORDER — SODIUM CHLORIDE 0.9 % IV SOLN: INTRAVENOUS | Status: DC

## 2019-11-11 MED ORDER — ASPIRIN 81 MG PO CHEW
CHEWABLE_TABLET | ORAL | Status: AC
  Administered 2019-11-11: 11:00:00 81 mg via ORAL
  Filled 2019-11-11: qty 1

## 2019-11-11 MED ORDER — FENTANYL CITRATE (PF) 100 MCG/2ML IJ SOLN
INTRAMUSCULAR | Status: DC | PRN
  Administered 2019-11-11: 25 ug via INTRAVENOUS
  Administered 2019-11-11: 50 ug via INTRAVENOUS
  Administered 2019-11-11: 25 ug via INTRAVENOUS
  Administered 2019-11-11: 50 ug via INTRAVENOUS

## 2019-11-11 MED ORDER — ONDANSETRON HCL 4 MG/2ML IJ SOLN: 4.0000 mg | Freq: Four times a day (QID) | INTRAMUSCULAR | Status: DC | PRN

## 2019-11-11 MED ORDER — MIDAZOLAM HCL 2 MG/2ML IJ SOLN
INTRAMUSCULAR | Status: DC | PRN
  Administered 2019-11-11: 1 mg via INTRAVENOUS
  Administered 2019-11-11: 2 mg via INTRAVENOUS
  Administered 2019-11-11 (×2): 1 mg via INTRAVENOUS

## 2019-11-11 MED ORDER — ASPIRIN EC 81 MG PO TBEC: 81.0000 mg | DELAYED_RELEASE_TABLET | Freq: Every day | ORAL | 2 refills | Status: DC

## 2019-11-11 SURGICAL SUPPLY — 13 items
BALLN LUTONIX 5X220X130 (BALLOONS) ×3
BALLOON LUTONIX 5X220X130 (BALLOONS) ×1 IMPLANT
CANNULA 5F STIFF (CANNULA) ×6 IMPLANT
CATH BEACON 5 .038 100 VERT TP (CATHETERS) ×3 IMPLANT
CATH PIG 70CM (CATHETERS) ×3 IMPLANT
COVER PROBE U/S 5X48 (MISCELLANEOUS) ×3 IMPLANT
DEVICE PRESTO INFLATION (MISCELLANEOUS) ×3 IMPLANT
DEVICE STARCLOSE SE CLOSURE (Vascular Products) ×3 IMPLANT
GLIDEWIRE ADV .035X260CM (WIRE) ×3 IMPLANT
PACK ANGIOGRAPHY (CUSTOM PROCEDURE TRAY) ×3 IMPLANT
SHEATH ANL2 6FRX45 HC (SHEATH) ×3 IMPLANT
SHEATH BRITE TIP 5FRX11 (SHEATH) ×3 IMPLANT
WIRE J 3MM .035X145CM (WIRE) ×3 IMPLANT

## 2019-11-11 NOTE — H&P (Signed)
[facility] History & Physical Update  The patient was interviewed and re-examined.  The patient's previous History and Physical has been reviewed and is unchanged.  There is no change in the plan of care. We plan to proceed with the scheduled procedure.  Festus Barren, MD  11/11/2019, 8:19 AM

## 2019-11-11 NOTE — Discharge Instructions (Signed)
Clopidogrel tablets What is this medicine? CLOPIDOGREL (kloh PID oh grel) helps to prevent blood clots. This medicine is used to prevent heart attack, stroke, or other vascular events in people who are at high risk. This medicine may be used for other purposes; ask your health care provider or pharmacist if you have questions. COMMON BRAND NAME(S): Plavix What should I tell my health care provider before I take this medicine? They need to know if you have any of the following conditions:  bleeding disorders  bleeding in the brain  having surgery  history of stomach bleeding  an unusual or allergic reaction to clopidogrel, other medicines, foods, dyes, or preservatives  pregnant or trying to get pregnant  breast-feeding How should I use this medicine? Take this medicine by mouth with a glass of water. Follow the directions on the prescription label. You may take this medicine with or without food. If it upsets your stomach, take it with food. Take your medicine at regular intervals. Do not take it more often than directed. Do not stop taking except on your doctor's advice. A special MedGuide will be given to you by the pharmacist with each prescription and refill. Be sure to read this information carefully each time. Talk to your pediatrician regarding the use of this medicine in children. Special care may be needed. Overdosage: If you think you have taken too much of this medicine contact a poison control center or emergency room at once. NOTE: This medicine is only for you. Do not share this medicine with others. What if I miss a dose? If you miss a dose, take it as soon as you can. If it is almost time for your next dose, take only that dose. Do not take double or extra doses. What may interact with this medicine? Do not take this medicine with the following medications:  dasabuvir; ombitasvir; paritaprevir; ritonavir  defibrotide  selexipag This medicine may also interact with  the following medications:  certain medicines that treat or prevent blood clots like warfarin  narcotic medicines for pain  NSAIDs, medicines for pain and inflammation, like ibuprofen or naproxen  repaglinide  SNRIs, medicines for depression, like desvenlafaxine, duloxetine, levomilnacipran, venlafaxine  SSRIs, medicines for depression, like citalopram, escitalopram, fluoxetine, fluvoxamine, paroxetine, sertraline  stomach acid blockers like cimetidine, esomeprazole, omeprazole This list may not describe all possible interactions. Give your health care provider a list of all the medicines, herbs, non-prescription drugs, or dietary supplements you use. Also tell them if you smoke, drink alcohol, or use illegal drugs. Some items may interact with your medicine. What should I watch for while using this medicine? Visit your doctor or health care professional for regular check-ups. Do not stop taking your medicine unless your doctor tells you to. Notify your doctor or health care professional and seek emergency treatment if you develop breathing problems; changes in vision; chest pain; severe, sudden headache; pain, swelling, warmth in the leg; trouble speaking; sudden numbness or weakness of the face, arm or leg. These can be signs that your condition has gotten worse. If you are going to have surgery or dental work, tell your doctor or health care professional that you are taking this medicine. Certain genetic factors may reduce the effect of this medicine. Your doctor may use genetic tests to determine treatment. Only take aspirin if you are instructed to. Low doses of aspirin are used with this medicine to treat some conditions. Taking aspirin with this medicine can increase your risk of bleeding so   you must be careful. Talk to your doctor or pharmacist if you have questions. What side effects may I notice from receiving this medicine? Side effects that you should report to your doctor or  health care professional as soon as possible:  allergic reactions like skin rash, itching or hives, swelling of the face, lips, or tongue  signs and symptoms of bleeding such as bloody or black, tarry stools; red or dark-brown urine; spitting up blood or brown material that looks like coffee grounds; red spots on the skin; unusual bruising or bleeding from the eye, gums, or nose  signs and symptoms of a blood clot such as breathing problems; changes in vision; chest pain; severe, sudden headache; pain, swelling, warmth in the leg; trouble speaking; sudden numbness or weakness of the face, arm or leg  signs and symptoms of low blood sugar such as feeling anxious; confusion; dizziness; increased hunger; unusually weak or tired; increased sweating; shakiness; cold, clammy skin; irritable; headache; blurred vision; fast heartbeat; loss of consciousness Side effects that usually do not require medical attention (report to your doctor or health care professional if they continue or are bothersome):  constipation  diarrhea  headache  upset stomach This list may not describe all possible side effects. Call your doctor for medical advice about side effects. You may report side effects to FDA at 1-800-FDA-1088. Where should I keep my medicine? Keep out of the reach of children. Store at room temperature of 59 to 86 degrees F (15 to 30 degrees C). Throw away any unused medicine after the expiration date. NOTE: This sheet is a summary. It may not cover all possible information. If you have questions about this medicine, talk to your doctor, pharmacist, or health care provider.  2020 Elsevier/Gold Standard (2017-12-08 15:03:38)   Moderate Conscious Sedation, Adult, Care After These instructions provide you with information about caring for yourself after your procedure. Your health care provider may also give you more specific instructions. Your treatment has been planned according to current medical  practices, but problems sometimes occur. Call your health care provider if you have any problems or questions after your procedure. What can I expect after the procedure? After your procedure, it is common:  To feel sleepy for several hours.  To feel clumsy and have poor balance for several hours.  To have poor judgment for several hours.  To vomit if you eat too soon. Follow these instructions at home: For at least 24 hours after the procedure:   Do not: ? Participate in activities where you could fall or become injured. ? Drive. ? Use heavy machinery. ? Drink alcohol. ? Take sleeping pills or medicines that cause drowsiness. ? Make important decisions or sign legal documents. ? Take care of children on your own.  Rest. Eating and drinking  Follow the diet recommended by your health care provider.  If you vomit: ? Drink water, juice, or soup when you can drink without vomiting. ? Make sure you have little or no nausea before eating solid foods. General instructions  Have a responsible adult stay with you until you are awake and alert.  Take over-the-counter and prescription medicines only as told by your health care provider.  If you smoke, do not smoke without supervision.  Keep all follow-up visits as told by your health care provider. This is important. Contact a health care provider if:  You keep feeling nauseous or you keep vomiting.  You feel light-headed.  You develop a rash.  You  have a fever. Get help right away if:  You have trouble breathing. This information is not intended to replace advice given to you by your health care provider. Make sure you discuss any questions you have with your health care provider. Document Revised: 06/20/2017 Document Reviewed: 10/28/2015 Elsevier Patient Education  2020 [employer].   Angiogram  An angiogram is an X-ray test. It is used to check for problems in the blood vessels. In this test, a dye is put into  the blood vessel through a long, thin tube (catheter). It helps your doctor see if there are problem in the blood vessel. The catheter may go through:  Your upper leg area (groin).  The fold of your arm, near your elbow.  Your wrist. Tell your doctor about:  Any allergies you have. This includes allergies to medicines, shellfish, contrast dye, or iodine.  All medicines you are taking, including vitamins, herbs, eye drops, creams, and over-the-counter medicines.  Any problems you or family members have had with anesthetic medicines.  Any blood disorders you have.  Any surgeries you have had.  Any medical conditions you have or have had, including any kidney problems or kidney failure.  Whether you are pregnant or may be pregnant.  Whether you are breastfeeding. What are the risks? Generally, this is a safe procedure. However, problems may occur, including:  Infection.  Bleeding.  Bruising.  Allergic reactions to medicines or dyes.  Blood vessels may be damaged. The dye may damage the kidneys.  Blood clots that can lead to a stroke or heart attack. What happens before the procedure? Staying hydrated Follow instructions from your doctor about hydration, which may include:  Up to 2 hours before the test - you may continue to drink clear liquids, such as water, clear fruit juice, black coffee, and plain tea. Eating and drinking Follow instructions from your doctor about eating and drinking, which may include:  8 hours before the test - stop eating heavy meals or foods such as meat, fried foods, or fatty foods.  6 hours before the test - stop eating light meals or foods, such as toast or cereal.  2 hours before the test - stop drinking clear liquids. Medicines Ask your doctor about:  Changing or stopping your normal medicines. This is important.  Taking aspirin and ibuprofen. Do not take these medicines unless your doctor tells you to take them.  Taking  over-the-counter medicines, vitamins, herbs, and supplements. Surgery safety Ask your doctor:  How your surgery site will be marked.  What steps will be taken to help prevent the spread of germs. These may include: ? Removing hair at the surgery site. ? Washing skin with a germ-killing soap. ? Taking antibiotic medicine. General instructions  Do not use any products that contain nicotine or tobacco before the test. This includes cigarettes, e-cigarettes, and chewing tobacco. Do this for at least 4 weeks. If you need help quitting, ask your doctor.  Blood samples may be taken.  Plan to have someone take you home from the hospital or clinic.  If you will be going home right after the test, plan to have someone with you for 24 hours. What happens during the procedure?  You will lie on your back on an X-ray table.  An IV will be put into one of your veins.  Small patches (electrodes) may be placed on your chest. This will be used to check your heart rate during the test.  You will be given one  or both of the following: ? A medicine to help you relax (sedative). ? A medicine to numb the area where the catheter will be inserted (local anesthetic).  A small cut will be made for the catheter.  The catheter will be inserted into an artery using a guide wire.  Your doctor will move the catheter into the blood vessel to check for problems.  Dye will be put in through the catheter. X-rays of your blood vessels will then be taken.  Tell your doctor if you have chest pain or trouble breathing.  After the X-ray is done, the catheter will be taken out.  A bandage will be placed over the site where the cut was made. Pressure will be applied to help stop any bleeding.  The IV will be removed. The procedure may vary among doctors and hospitals. What happens after the procedure?  You will be monitored until you leave the hospital or clinic. This includes checking your blood pressure,  heart rate, breathing rate, and blood oxygen.  You will be kept in bed lying flat for 6 hours.  The insertion area and the pulse in your feet or wrist will be checked.  You will be told to drink plenty of fluids. This will help get the dye out of your body.  More blood tests and X-rays may be done.  Tests to check your heart may be done.  Do not drive for 24 hours if you were given a medicine to help you relax.  It is up to you to get the results of your procedure. Ask your doctor, or the department that is doing the procedure, when your results will be ready. Summary  An angiogram is an X-ray test used to check your blood vessels for problems.  Follow every thing your doctor tells you to do before the test. You will be told when to stop eating, or what medicines to change or stop.  During the procedure, a dye is put through a catheter into an artery. X-rays are then taken.  After the procedure, you will need to drink plenty of fluids and lie flat for 6 hours. This information is not intended to replace advice given to you by your health care provider. Make sure you discuss any questions you have with your health care provider. Document Revised: 01/20/2019 Document Reviewed: 01/20/2019 Elsevier Patient Education  2020 Elsevier Inc.    Femoral Site Care This sheet gives you information about how to care for yourself after your procedure. Your health care provider may also give you more specific instructions. If you have problems or questions, contact your health care provider. What can I expect after the procedure? After the procedure, it is common to have:  Bruising that usually fades within 1-2 weeks.  Tenderness at the site. Follow these instructions at home: Wound care  Follow instructions from your health care provider about how to take care of your insertion site. Make sure you: ? Wash your hands with soap and water before you change your bandage (dressing). If soap  and water are not available, use hand sanitizer. ? Change your dressing as told by your health care provider. ? Leave stitches (sutures), skin glue, or adhesive strips in place. These skin closures may need to stay in place for 2 weeks or longer. If adhesive strip edges start to loosen and curl up, you may trim the loose edges. Do not remove adhesive strips completely unless your health care provider tells you to do that.  Do not take baths, swim, or use a hot tub until your health care provider approves.  You may shower 24-48 hours after the procedure or as told by your health care provider. ? Gently wash the site with plain soap and water. ? Pat the area dry with a clean towel. ? Do not rub the site. This may cause bleeding.  Do not apply powder or lotion to the site. Keep the site clean and dry.  Check your femoral site every day for signs of infection. Check for: ? Redness, swelling, or pain. ? Fluid or blood. ? Warmth. ? Pus or a bad smell. Activity  For the first 2-3 days after your procedure, or as long as directed: ? Avoid climbing stairs as much as possible. ? Do not squat.  Do not lift anything that is heavier than 10 lb (4.5 kg), or the limit that you are told, until your health care provider says that it is safe.  Rest as directed. ? Avoid sitting for a long time without moving. Get up to take short walks every 1-2 hours.  Do not drive for 24 hours if you were given a medicine to help you relax (sedative). General instructions  Take over-the-counter and prescription medicines only as told by your health care provider.  Keep all follow-up visits as told by your health care provider. This is important. Contact a health care provider if you have:  A fever or chills.  You have redness, swelling, or pain around your insertion site. Get help right away if:  The catheter insertion area swells very fast.  You pass out.  You suddenly start to sweat or your skin gets  clammy.  The catheter insertion area is bleeding, and the bleeding does not stop when you hold steady pressure on the area.  The area near or just beyond the catheter insertion site becomes pale, cool, tingly, or numb. These symptoms may represent a serious problem that is an emergency. Do not wait to see if the symptoms will go away. Get medical help right away. Call your local emergency services (911 in the [place] Do not drive yourself to the hospital. Summary  After the procedure, it is common to have bruising that usually fades within 1-2 weeks.  Check your femoral site every day for signs of infection.  Do not lift anything that is heavier than 10 lb (4.5 kg), or the limit that you are told, until your health care provider says that it is safe. This information is not intended to replace advice given to you by your health care provider. Make sure you discuss any questions you have with your health care provider. Document Revised: 07/21/2017 Document Reviewed: 07/21/2017 Elsevier Patient Education  2020 [employer].

## 2019-11-11 NOTE — Op Note (Signed)
[facility]  Percutaneous Study/Intervention Procedural Note   Date of Surgery: 11/11/2019  Surgeon(s):DEW,JASON    Assistants:none  Pre-operative Diagnosis: PAD with claudication left lower extremity  Post-operative diagnosis:  Same  Procedure(s) Performed:             1.  Ultrasound guidance for vascular access right femoral artery             2.  Catheter placement into left SFA from right femoral approach             3.  Aortogram and selective left lower extremity angiogram             4.  Percutaneous transluminal angioplasty of left SFA with 5 mm diameter by 22 cm length Lutonix drug-coated angioplasty balloon             5.  StarClose closure device right femoral artery  EBL: 10 cc  Contrast: 50 cc  Fluoro Time: 5.2 minutes  Moderate Conscious Sedation Time: approximately 30 minutes using 5 mg of Versed and 150 mcg of Fentanyl              Indications:  Patient is a 74 y.o.female with claudication of the left lower extremity.  She is status post right leg bypass at another institution. The patient has noninvasive study showing the bypass is open although she continues to have pain, swelling, and issues with the right leg as well. The patient is brought in for angiography for further evaluation and potential treatment.  It was discussed that nothing further from a vascular surgical point of view can be done on the right leg with good perfusion at this point.  We could improve her claudication symptoms on the left with endovascular treatment and she desired to proceed.  Risks and benefits are discussed and informed consent is obtained.   Procedure:  The patient was identified and appropriate procedural time out was performed.  The patient was then placed supine on the table and prepped and draped in the usual sterile fashion. Moderate conscious sedation was administered during a face to face encounter with the patient throughout the procedure with my  supervision of the RN administering medicines and monitoring the patient's vital signs, pulse oximetry, telemetry and mental status throughout from the start of the procedure until the patient was taken to the recovery room. Ultrasound was used to evaluate the right common femoral artery.  It was patent .  A digital ultrasound image was acquired.  A Seldinger needle was used to access the right common femoral artery under direct ultrasound guidance and a permanent image was performed.  A 0.035 J wire was advanced without resistance and a 5Fr sheath was placed.  Pigtail catheter was placed into the aorta and an AP aortogram was performed. The aorta was mildly ectatic with no stenosis.  The iliac arteries were patent including the previously placed left iliac stent.  The renal arteries appear to be patent with no flow-limiting stenosis. I then crossed the aortic bifurcation and advanced to the left femoral head and then into the proximal left SFA. Selective left lower extremity angiogram was then performed. This demonstrated a greater than 90% stenosis in the proximal to mid SFA with some fairly diffuse disease in the mid SFA and high-grade stenosis in the distal SFA in the 80 to 90% range.  The spanned over about 15 cm.  Distally, the anterior tibial artery was large and continuous into the foot but was  the only runoff vessel.  The tibioperoneal trunk and the proximal portion of the posterior tibial and peroneal arteries were occluded. It was felt that it was in the patient's best interest to proceed with intervention after these images to avoid a second procedure and a larger amount of contrast and fluoroscopy based off of the findings from the initial angiogram. The patient was systemically heparinized and a 6 [place] Ansell sheath was then placed over the [employer] wire. I then used a Kumpe catheter and the advantage wire to navigate through the SFA disease without difficulty.  I then got down into the  tibioperoneal trunk.  Attempts to cross the tibioperoneal trunk occlusion were terminated as the patient had continuous motion making imaging and crossing an occlusion extremely difficult.  I felt it would be unsafe to continue to try to cross this occlusion with her continuous motion.  I did elect to treat her SFA lesion.  A 5 mm diameter by 22 cm Lutonix drug-coated angioplasty balloon was inflated encompassing the lesions and going down to Hunter's canal.  This was inflated to 10 atm for 1 minute.  Completion imaging showed less than 30% residual stenosis but did not appear flow-limiting. I elected to terminate the procedure. The sheath was removed and StarClose closure device was deployed in the right femoral artery with excellent hemostatic result. The patient was taken to the recovery room in stable condition having tolerated the procedure well.  Findings:               Aortogram:  The aorta was mildly ectatic with no stenosis.  The iliac arteries were patent including the previously placed left iliac stent.  The renal arteries appear to be patent with no flow-limiting stenosis             Left lower Extremity:  This demonstrated a greater than 90% stenosis in the proximal to mid SFA with some fairly diffuse disease in the mid SFA and high-grade stenosis in the distal SFA in the 80 to 90% range.  The spanned over about 15 cm.  Distally, the anterior tibial artery was large and continuous into the foot but was the only runoff vessel.  The tibioperoneal trunk and the proximal portion of the posterior tibial and peroneal arteries were occluded   Disposition: Patient was taken to the recovery room in stable condition having tolerated the procedure well.  Complications: None  Leotis Pain 11/11/2019 9:15 AM   This note was created with Dragon Medical transcription system. Any errors in dictation are purely unintentional.

## 2019-11-12 ENCOUNTER — Ambulatory Visit: Payer: Federal, State, Local not specified - PPO | Admitting: Family Medicine

## 2019-11-12 ENCOUNTER — Encounter: Payer: Self-pay | Admitting: Cardiology

## 2019-11-12 VITALS — BP 132/90 | HR 77 | Temp 98.3°F | Wt 180.8 lb

## 2019-11-12 DIAGNOSIS — I10 Essential (primary) hypertension: Secondary | ICD-10-CM | POA: Diagnosis not present

## 2019-11-12 DIAGNOSIS — E538 Deficiency of other specified B group vitamins: Secondary | ICD-10-CM | POA: Diagnosis not present

## 2019-11-12 DIAGNOSIS — Z720 Tobacco use: Secondary | ICD-10-CM

## 2019-11-12 DIAGNOSIS — M792 Neuralgia and neuritis, unspecified: Secondary | ICD-10-CM

## 2019-11-12 DIAGNOSIS — I70213 Atherosclerosis of native arteries of extremities with intermittent claudication, bilateral legs: Secondary | ICD-10-CM | POA: Diagnosis not present

## 2019-11-12 DIAGNOSIS — E782 Mixed hyperlipidemia: Secondary | ICD-10-CM

## 2019-11-12 DIAGNOSIS — I739 Peripheral vascular disease, unspecified: Secondary | ICD-10-CM

## 2019-11-12 LAB — MICROSCOPIC EXAMINATION: RBC, Urine: NONE SEEN /hpf (ref 0–2)

## 2019-11-12 LAB — UA/M W/RFLX CULTURE, ROUTINE
Bilirubin, UA: NEGATIVE
Glucose, UA: NEGATIVE
Ketones, UA: NEGATIVE
Leukocytes,UA: NEGATIVE
Nitrite, UA: NEGATIVE
RBC, UA: NEGATIVE
Specific Gravity, UA: >1.03 — ABNORMAL HIGH (ref 1.005–1.030)
Urobilinogen, Ur: 0.2 mg/dL (ref 0.2–1.0)
pH, UA: 5 (ref 5.0–7.5)

## 2019-11-12 LAB — MICROALBUMIN, URINE WAIVED
Creatinine, Urine Waived: 200 mg/dL (ref 10–300)
Microalb, Ur Waived: 150 mg/L — ABNORMAL HIGH (ref 0–19)

## 2019-11-12 MED ORDER — TRIAMCINOLONE ACETONIDE 0.5 % EX OINT: 1.0000 "application " | TOPICAL_OINTMENT | Freq: Two times a day (BID) | CUTANEOUS | 0 refills | Status: DC

## 2019-11-12 MED ORDER — HYDROCHLOROTHIAZIDE 25 MG PO TABS: ORAL_TABLET | ORAL | 1 refills | Status: DC

## 2019-11-12 MED ORDER — AMLODIPINE BESYLATE 10 MG PO TABS: 10.0000 mg | ORAL_TABLET | Freq: Every day | ORAL | 1 refills | Status: DC

## 2019-11-12 MED ORDER — ATORVASTATIN CALCIUM 40 MG PO TABS: 40.0000 mg | ORAL_TABLET | Freq: Every day | ORAL | 1 refills | Status: DC

## 2019-11-12 MED ORDER — ONDANSETRON HCL 4 MG PO TABS: 4.0000 mg | ORAL_TABLET | Freq: Four times a day (QID) | ORAL | 1 refills | Status: DC | PRN

## 2019-11-12 MED ORDER — NORTRIPTYLINE HCL 50 MG PO CAPS: ORAL_CAPSULE | ORAL | 1 refills | Status: DC

## 2019-11-12 MED ORDER — LISINOPRIL 40 MG PO TABS: ORAL_TABLET | ORAL | 1 refills | Status: DC

## 2019-11-12 MED ORDER — CITALOPRAM HYDROBROMIDE 10 MG PO TABS: ORAL_TABLET | ORAL | 3 refills | Status: DC

## 2019-11-12 NOTE — Assessment & Plan Note (Signed)
Has been doing her shots at home. Feeling well. Rechecking labs today. Await results. Call with any concerns.

## 2019-11-12 NOTE — Assessment & Plan Note (Signed)
Under good control on current regimen. Continue current regimen. Continue to monitor. Call with any concerns. Refills given. Labs drawn today.   

## 2019-11-12 NOTE — Assessment & Plan Note (Signed)
Continue to follow with vascular. Had angiogram yesterday. Call with any concerns. Will keep BP and cholesterol under good control. Continue plavix.

## 2019-11-12 NOTE — Assessment & Plan Note (Signed)
Continue to follow with vascular. Had angiogram yesterday. Call with any concerns. Will keep BP and cholesterol under good control. Continue plavix. 

## 2019-11-12 NOTE — Assessment & Plan Note (Signed)
Following with chronic pain, neurology and vascular. Unclear what she's taking. Appears comfortable today. Continue to monitor. Call with any concerns.

## 2019-11-12 NOTE — Progress Notes (Signed)
BP 132/90 (BP Location: Left Arm, Patient Position: Sitting, Cuff Size: Normal)   Pulse 77   Temp 98.3 F (36.8 C) (Oral)   Wt 180 lb 12.8 oz (82 kg)   SpO2 97%   BMI 31.03 kg/m    Subjective:    Patient ID: Tara [name], female    DOB: [birth year], 74 y.o.   MRN: [id number]  HPI: Tara [name] is a 74 y.o. female  Chief Complaint  Patient presents with  . nerve pain  . Medication Refill    hydrochlorothiazide   Saw Dr. Lucky Cowboy yesterday for a angiogram. 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 They were getting her set up with a lymphedema pump. For the neuropathy in her leg, he started her on lyrica as she was failing gabapentin.    Due to see Dr. Melrose Nakayama on Tuesday for follow up. She saw him at the beginning of April. At that time she was started on aricept and had her gabapentin increased. He also started her on trazodone. He got her scheduled for an EMG of her arms  Today, she is feeling better, but feeling drained. She is still taking the gabapentin 3x a day at 300mg . Has been seeing pain management though [facility] and that seems like it helps. On oxycodone from her. Today, her pain is about an 8/10. It is unclear if she is taking lyrica or gabapentin. She states that she is taking 100mg  TID.Marland Kitchen which may be lyrica.   HYPERTENSION / HYPERLIPIDEMIA Satisfied with current treatment? yes Duration of hypertension: chronic BP monitoring frequency: not checking BP medication side effects: no Duration of hyperlipidemia: chronic Cholesterol medication side effects: no Cholesterol supplements: none Past cholesterol medications: atorvastatin Medication compliance: good compliance Aspirin: yes Recent stressors: no Recurrent headaches: no Visual changes: no Palpitations: no Dyspnea: no Chest pain: no Lower extremity edema: yes Dizzy/lightheaded: no   She is otherwise doing well with no other concerns  or complaints at this time.   Relevant past medical, surgical, family and social history reviewed and updated as indicated. Interim medical history since our last visit reviewed. Allergies and medications reviewed and updated.  Review of Systems  Constitutional: Positive for fatigue. Negative for activity change, appetite change, chills, diaphoresis, fever and unexpected weight change.  Respiratory: Negative.   Cardiovascular: Negative.   Gastrointestinal: Negative.   Genitourinary: Negative.   Musculoskeletal: Positive for myalgias. Negative for arthralgias, back pain, gait problem, joint swelling, neck pain and neck stiffness.  Skin: Negative.   Neurological: Negative.   Hematological: Negative.   Psychiatric/Behavioral: Negative.     Per HPI unless specifically indicated above     Objective:    BP 132/90 (BP Location: Left Arm, Patient Position: Sitting, Cuff Size: Normal)   Pulse 77   Temp 98.3 F (36.8 C) (Oral)   Wt 180 lb 12.8 oz (82 kg)   SpO2 97%   BMI 31.03 kg/m   Wt Readings from Last 3 Encounters:  11/12/19 180 lb 12.8 oz (82 kg)  11/11/19 174 lb (78.9 kg)  10/26/19 181 lb (82.1 kg)    Physical Exam  Results for orders placed or performed during the hospital encounter of 11/11/19  BUN  Result Value Ref Range   BUN 30 (H) 8 - 23 mg/dL  Creatinine, serum  Result Value Ref Range   Creatinine, Ser 0.72 0.44 - 1.00 mg/dL   GFR calc non Af Amer >60 >60 mL/min  GFR calc Af Amer >60 >60 mL/min      Assessment & Plan:   Problem List Items Addressed This Visit      Cardiovascular and Mediastinum   HTN (hypertension) - Primary    Under good control on current regimen. Continue current regimen. Continue to monitor. Call with any concerns. Refills given. Labs drawn today.       Relevant Medications   lisinopril (ZESTRIL) 40 MG tablet   hydrochlorothiazide (HYDRODIURIL) 25 MG tablet   atorvastatin (LIPITOR) 40 MG tablet   amLODipine (NORVASC) 10 MG tablet    Other Relevant Orders   CBC with Differential/Platelet   Basic metabolic panel   Microalbumin, Urine Waived   PAD (peripheral artery disease) (HCC)    Continue to follow with vascular. Had angiogram yesterday. Call with any concerns. Will keep BP and cholesterol under good control. Continue plavix.      Relevant Medications   lisinopril (ZESTRIL) 40 MG tablet   hydrochlorothiazide (HYDRODIURIL) 25 MG tablet   atorvastatin (LIPITOR) 40 MG tablet   amLODipine (NORVASC) 10 MG tablet   Atherosclerosis of native arteries of extremity with intermittent claudication (HCC)    Continue to follow with vascular. Had angiogram yesterday. Call with any concerns. Will keep BP and cholesterol under good control. Continue plavix.      Relevant Medications   nortriptyline (PAMELOR) 50 MG capsule   lisinopril (ZESTRIL) 40 MG tablet   hydrochlorothiazide (HYDRODIURIL) 25 MG tablet   atorvastatin (LIPITOR) 40 MG tablet   citalopram (CELEXA) 10 MG tablet   amLODipine (NORVASC) 10 MG tablet   Other Relevant Orders   CBC with Differential/Platelet   Basic metabolic panel     Other   Hyperlipidemia    Continue to follow with vascular. Had angiogram yesterday. Call with any concerns. Will keep BP and cholesterol under good control. Continue plavix.      Relevant Medications   lisinopril (ZESTRIL) 40 MG tablet   hydrochlorothiazide (HYDRODIURIL) 25 MG tablet   atorvastatin (LIPITOR) 40 MG tablet   amLODipine (NORVASC) 10 MG tablet   B12 deficiency    Has been doing her shots at home. Feeling well. Rechecking labs today. Await results. Call with any concerns.       Relevant Orders   CBC with Differential/Platelet   Basic metabolic panel   V78   Nerve pain    Following with chronic pain, neurology and vascular. Unclear what she's taking. Appears comfortable today. Continue to monitor. Call with any concerns.        Other Visit Diagnoses    Tobacco abuse       Not interested in quitting  right now. Continue to monitor.    Relevant Orders   UA/M w/rflx Culture, Routine       Follow up plan: Return in about 3 months (around 02/11/2020).

## 2019-11-13 LAB — BASIC METABOLIC PANEL
BUN/Creatinine Ratio: 31 — ABNORMAL HIGH (ref 12–28)
BUN: 24 mg/dL (ref 8–27)
CO2: 26 mmol/L (ref 20–29)
Calcium: 9.4 mg/dL (ref 8.7–10.3)
Chloride: 105 mmol/L (ref 96–106)
Creatinine, Ser: 0.78 mg/dL (ref 0.57–1.00)
GFR calc Af Amer: 88 mL/min/{1.73_m2} (ref >59)
GFR calc non Af Amer: 77 mL/min/{1.73_m2} (ref >59)
Glucose: 97 mg/dL (ref 65–99)
Potassium: 4.3 mmol/L (ref 3.5–5.2)
Sodium: 143 mmol/L (ref 134–144)

## 2019-11-13 LAB — CBC WITH DIFFERENTIAL/PLATELET
Basophils Absolute: 0 10*3/uL (ref 0.0–0.2)
Basos: 0 %
EOS (ABSOLUTE): 0.1 10*3/uL (ref 0.0–0.4)
Eos: 1 %
Hematocrit: 48.2 % — ABNORMAL HIGH (ref 34.0–46.6)
Hemoglobin: 16 g/dL — ABNORMAL HIGH (ref 11.1–15.9)
Immature Grans (Abs): 0 10*3/uL (ref 0.0–0.1)
Immature Granulocytes: 0 %
Lymphocytes Absolute: 1.4 10*3/uL (ref 0.7–3.1)
Lymphs: 21 %
MCH: 28.2 pg (ref 26.6–33.0)
MCHC: 33.2 g/dL (ref 31.5–35.7)
MCV: 85 fL (ref 79–97)
Monocytes Absolute: 0.5 10*3/uL (ref 0.1–0.9)
Monocytes: 7 %
Neutrophils Absolute: 4.6 10*3/uL (ref 1.4–7.0)
Neutrophils: 71 %
Platelets: 170 10*3/uL (ref 150–450)
RBC: 5.67 x10E6/uL — ABNORMAL HIGH (ref 3.77–5.28)
RDW: 13.9 % (ref 11.7–15.4)
WBC: 6.5 10*3/uL (ref 3.4–10.8)

## 2019-11-13 LAB — VITAMIN B12: Vitamin B-12: 1185 pg/mL (ref 232–1245)

## 2019-11-15 ENCOUNTER — Encounter: Payer: Self-pay | Admitting: Family Medicine

## 2019-11-15 ENCOUNTER — Encounter: Payer: Self-pay | Admitting: Cardiology

## 2019-12-03 ENCOUNTER — Telehealth: Payer: Self-pay | Admitting: Family Medicine

## 2019-12-03 NOTE — Telephone Encounter (Signed)
Copied from CRM [id number]. Topic: General - Other >> Dec 03, 2019 12:56 PM Tara [name] wrote: Reason for CRM: Patient called to ask doctor if she could try another pain medication, Lyrica, because the gabapentin is not working for her.  Please advise and call patient to let her know.  CB# [phone]

## 2019-12-03 NOTE — Telephone Encounter (Signed)
Appt scheduled

## 2019-12-03 NOTE — Telephone Encounter (Signed)
Routing to provider to advise.  

## 2019-12-03 NOTE — Telephone Encounter (Signed)
Will need an appointment to change medications

## 2019-12-08 ENCOUNTER — Ambulatory Visit (INDEPENDENT_AMBULATORY_CARE_PROVIDER_SITE_OTHER): Payer: Federal, State, Local not specified - PPO | Admitting: Family Medicine

## 2019-12-08 ENCOUNTER — Telehealth: Payer: Self-pay | Admitting: Family Medicine

## 2019-12-08 ENCOUNTER — Encounter: Payer: Self-pay | Admitting: Family Medicine

## 2019-12-08 VITALS — BP 120/70 | Temp 98.6°F

## 2019-12-08 DIAGNOSIS — G629 Polyneuropathy, unspecified: Secondary | ICD-10-CM | POA: Diagnosis not present

## 2019-12-08 MED ORDER — PREGABALIN 75 MG PO CAPS: ORAL_CAPSULE | ORAL | 1 refills | Status: DC

## 2019-12-08 NOTE — Patient Instructions (Signed)
Start taking 1 gabapentin in AM, PM and qHS for 3 days Then take 1 gabapentin in AM and QHS for 3 days Then take 1 gabapentin QHS  For 3 days and stop Start lyrica when you stop your gabapentin.

## 2019-12-08 NOTE — Telephone Encounter (Signed)
-----   Message from Dorcas Carrow, DO sent at 12/08/2019  8:57 AM EDT ----- 4 weeks follow up neuropathy- please mail AVS to patient

## 2019-12-08 NOTE — Telephone Encounter (Signed)
lvm to make this apt.  

## 2019-12-08 NOTE — Progress Notes (Signed)
BP 120/70   Temp 98.6 F (37 C)    Subjective:    Patient ID: Tara [name], female    DOB: May 08, 1946, 74 y.o.   MRN: [id number]  HPI: Tara [name] is a 74 y.o. female  Chief Complaint  Patient presents with  . Peripheral Neuropathy    lyrcia   NEUROPATHY- she notes that she is not feeling well on the gabapentin.  Neuropathy status: uncontrolled  Satisfied with current treatment?: no Medication side effects: no Medication compliance:  fair compliance Location: R lower leg Pain: yes Severity: moderate  Quality:  Numb and tingling Frequency: constant Bilateral: no Symmetric: no Numbness: yes Decreased sensation: yes Weakness: no Context: stable  Very upset about the outcome of her surgery in [place]. Continues with pain and cramping. She is not happy and feels like she has been "medically abused"  Relevant past medical, surgical, family and social history reviewed and updated as indicated. Interim medical history since our last visit reviewed. Allergies and medications reviewed and updated.  Review of Systems  Constitutional: Negative.   Respiratory: Negative.   Cardiovascular: Negative.   Gastrointestinal: Negative.   Musculoskeletal: Negative.   Neurological: Positive for numbness. Negative for dizziness, tremors, seizures, syncope, facial asymmetry, speech difficulty, weakness, light-headedness and headaches.  Psychiatric/Behavioral: Negative.     Per HPI unless specifically indicated above     Objective:    BP 120/70   Temp 98.6 F (37 C)   Wt Readings from Last 3 Encounters:  11/12/19 180 lb 12.8 oz (82 kg)  11/11/19 174 lb (78.9 kg)  10/26/19 181 lb (82.1 kg)    Physical Exam Vitals and nursing note reviewed.  Pulmonary:     Effort: Pulmonary effort is normal. No respiratory distress.     Comments: Speaking in full sentences Neurological:     Mental Status: She is alert.  Psychiatric:        Mood and Affect: Mood normal.      Behavior: Behavior normal.        Thought Content: Thought content normal.        Judgment: Judgment normal.     Results for orders placed or performed in visit on 11/12/19  Microscopic Examination   URINE  Result Value Ref Range   WBC, UA 0-5 0 - 5 /hpf   RBC None seen 0 - 2 /hpf   Epithelial Cells (non renal) 0-10 0 - 10 /hpf   Bacteria, UA Few (A) None seen/Few  CBC with Differential/Platelet  Result Value Ref Range   WBC 6.5 3.4 - 10.8 x10E3/uL   RBC 5.67 (H) 3.77 - 5.28 x10E6/uL   Hemoglobin 16.0 (H) 11.1 - 15.9 g/dL   Hematocrit 48.2 (H) 34.0 - 46.6 %   MCV 85 79 - 97 fL   MCH 28.2 26.6 - 33.0 pg   MCHC 33.2 31.5 - 35.7 g/dL   RDW 13.9 11.7 - 15.4 %   Platelets 170 150 - 450 x10E3/uL   Neutrophils 71 Not Estab. %   Lymphs 21 Not Estab. %   Monocytes 7 Not Estab. %   Eos 1 Not Estab. %   Basos 0 Not Estab. %   Neutrophils Absolute 4.6 1.4 - 7.0 x10E3/uL   Lymphocytes Absolute 1.4 0.7 - 3.1 x10E3/uL   Monocytes Absolute 0.5 0.1 - 0.9 x10E3/uL   EOS (ABSOLUTE) 0.1 0.0 - 0.4 x10E3/uL   Basophils Absolute 0.0 0.0 - 0.2 x10E3/uL   Immature Granulocytes 0 Not Estab. %  Immature Grans (Abs) 0.0 0.0 - 0.1 x10E3/uL  Basic metabolic panel  Result Value Ref Range   Glucose 97 65 - 99 mg/dL   BUN 24 8 - 27 mg/dL   Creatinine, Ser 3.01 0.57 - 1.00 mg/dL   GFR calc non Af Amer 77 >59 mL/min/1.73   GFR calc Af Amer 88 >59 mL/min/1.73   BUN/Creatinine Ratio 31 (H) 12 - 28   Sodium 143 134 - 144 mmol/L   Potassium 4.3 3.5 - 5.2 mmol/L   Chloride 105 96 - 106 mmol/L   CO2 26 20 - 29 mmol/L   Calcium 9.4 8.7 - 10.3 mg/dL  S01  Result Value Ref Range   Vitamin B-12 1,185 232 - 1,245 pg/mL  UA/M w/rflx Culture, Routine   Specimen: Urine   URINE  Result Value Ref Range   Specific Gravity, UA >1.030 (H) 1.005 - 1.030   pH, UA 5.0 5.0 - 7.5   Color, UA Yellow Yellow   Appearance Ur Clear Clear   Leukocytes,UA Negative Negative   Protein,UA 3+ (A) Negative/Trace   Glucose,  UA Negative Negative   Ketones, UA Negative Negative   RBC, UA Negative Negative   Bilirubin, UA Negative Negative   Urobilinogen, Ur 0.2 0.2 - 1.0 mg/dL   Nitrite, UA Negative Negative   Microscopic Examination See below:   Microalbumin, Urine Waived  Result Value Ref Range   Microalb, Ur Waived 150 (H) 0 - 19 mg/L   Creatinine, Urine Waived 200 10 - 300 mg/dL   Microalb/Creat Ratio 30-300 (H) <30 mg/g      Assessment & Plan:   Problem List Items Addressed This Visit      Nervous and Auditory   Polyneuropathy, unspecified - Primary    Due to trauma s/p surgery. We will bring her off gabapentin as it doesn't seem to be helping and start her on lyrica. Recheck 4-6 weeks. Call with any concerns.       Relevant Medications   donepezil (ARICEPT) 5 MG tablet   traZODone (DESYREL) 50 MG tablet   pregabalin (LYRICA) 75 MG capsule       Follow up plan: Return in about 4 weeks (around 01/05/2020).   . This visit was completed via telephone due to the restrictions of the COVID-19 pandemic. All issues as above were discussed and addressed but no physical exam was performed. If it was felt that the patient should be evaluated in the office, they were directed there. The patient verbally consented to this visit. Patient was unable to complete an audio/visual visit due to Lack of equipment. Due to the catastrophic nature of the COVID-19 pandemic, this visit was done through audio contact only. . Location of the patient: home . Location of the provider: home . Those involved with this call:  . Provider: Olevia Perches, DO . CMA: Floydene Flock, RMA . Front Desk/Registration: Adela Ports  . Time spent on call: 15 minutes on the phone discussing health concerns. 23 minutes total spent in review of patient's record and preparation of their chart.

## 2019-12-08 NOTE — Assessment & Plan Note (Signed)
Due to trauma s/p surgery. We will bring her off gabapentin as it doesn't seem to be helping and start her on lyrica. Recheck 4-6 weeks. Call with any concerns.

## 2019-12-10 ENCOUNTER — Other Ambulatory Visit (INDEPENDENT_AMBULATORY_CARE_PROVIDER_SITE_OTHER): Payer: Self-pay | Admitting: Vascular Surgery

## 2019-12-10 DIAGNOSIS — Z9862 Peripheral vascular angioplasty status: Secondary | ICD-10-CM

## 2019-12-13 ENCOUNTER — Other Ambulatory Visit (INDEPENDENT_AMBULATORY_CARE_PROVIDER_SITE_OTHER): Payer: Self-pay | Admitting: Vascular Surgery

## 2019-12-13 DIAGNOSIS — Z9862 Peripheral vascular angioplasty status: Secondary | ICD-10-CM

## 2019-12-13 DIAGNOSIS — I70212 Atherosclerosis of native arteries of extremities with intermittent claudication, left leg: Secondary | ICD-10-CM

## 2019-12-13 NOTE — Telephone Encounter (Signed)
Lvm to make this apt. ( 4 weeks)

## 2019-12-14 ENCOUNTER — Other Ambulatory Visit: Payer: Self-pay

## 2019-12-14 ENCOUNTER — Encounter: Payer: Self-pay | Admitting: Family Medicine

## 2019-12-14 ENCOUNTER — Encounter (INDEPENDENT_AMBULATORY_CARE_PROVIDER_SITE_OTHER): Payer: Self-pay | Admitting: Vascular Surgery

## 2019-12-14 ENCOUNTER — Ambulatory Visit (INDEPENDENT_AMBULATORY_CARE_PROVIDER_SITE_OTHER): Payer: Federal, State, Local not specified - PPO

## 2019-12-14 ENCOUNTER — Telehealth (INDEPENDENT_AMBULATORY_CARE_PROVIDER_SITE_OTHER): Payer: Self-pay

## 2019-12-14 ENCOUNTER — Ambulatory Visit (INDEPENDENT_AMBULATORY_CARE_PROVIDER_SITE_OTHER): Payer: Federal, State, Local not specified - PPO | Admitting: Vascular Surgery

## 2019-12-14 VITALS — BP 149/84 | HR 86 | Resp 16 | Wt 180.4 lb

## 2019-12-14 DIAGNOSIS — I70212 Atherosclerosis of native arteries of extremities with intermittent claudication, left leg: Secondary | ICD-10-CM | POA: Diagnosis not present

## 2019-12-14 DIAGNOSIS — I890 Lymphedema, not elsewhere classified: Secondary | ICD-10-CM

## 2019-12-14 DIAGNOSIS — M792 Neuralgia and neuritis, unspecified: Secondary | ICD-10-CM

## 2019-12-14 DIAGNOSIS — Z9862 Peripheral vascular angioplasty status: Secondary | ICD-10-CM | POA: Diagnosis not present

## 2019-12-14 DIAGNOSIS — E782 Mixed hyperlipidemia: Secondary | ICD-10-CM | POA: Diagnosis not present

## 2019-12-14 DIAGNOSIS — I10 Essential (primary) hypertension: Secondary | ICD-10-CM | POA: Diagnosis not present

## 2019-12-14 DIAGNOSIS — I70213 Atherosclerosis of native arteries of extremities with intermittent claudication, bilateral legs: Secondary | ICD-10-CM

## 2019-12-14 NOTE — Assessment & Plan Note (Signed)
Her ABIs today were normal at 1.01 on the right and 0.99 on the left with triphasic waveforms and normal digital pressures bilaterally consistent with no current arterial insufficiency.  At this point, she should do 3 months of Plavix from her most recent intervention which was about a month ago.  She can then switch to 325 aspirin daily.  We discussed the lymphedema pump and beginning to use that once or twice daily.  We also discussed the use of compression stockings regularly but not sleeping them at night.  She voices her understanding and we will plan to see her back in 6 months with noninvasive studies.

## 2019-12-14 NOTE — Patient Instructions (Signed)

## 2019-12-14 NOTE — Telephone Encounter (Signed)
Lvm, X3 sent letter.

## 2019-12-14 NOTE — Progress Notes (Signed)
MRN : [id number]  Tara [name] is a 74 y.o. (08-03-1945) female who presents with chief complaint of  Chief Complaint  Patient presents with  . Follow-up    [facility] 4wk post lle angio  .  History of Present Illness: Patient returns today in follow up of multiple issues.  About a month ago, she underwent left SFA angioplasty for peripheral arterial disease.  She has not had significant improvement in her claudication symptoms in her left leg.  She does have a previous history of DVT in that leg many years ago.  She is not having any significant swelling in the left leg.  Her ABIs today were normal at 1.01 on the right and 0.99 on the left with triphasic waveforms and normal digital pressures bilaterally consistent with no current arterial insufficiency. She is still quite troubled by her right leg.  The swelling has improved but not resolved in the right leg.  She still has numbness and pain in the right leg after her bypass surgery done at another institution about 4 months ago.  She remains very frustrated that this has not improved.  Current Outpatient Medications  Medication Sig Dispense Refill  . amLODipine (NORVASC) 10 MG tablet Take 1 tablet (10 mg total) by mouth daily. 90 tablet 1  . aspirin EC 81 MG tablet Take 1 tablet (81 mg total) by mouth daily. 150 tablet 2  . atorvastatin (LIPITOR) 40 MG tablet Take 1 tablet (40 mg total) by mouth daily. 90 tablet 1  . B-D TB SYRINGE 1CC/27GX1/2" 27G X 1/2" 1 ML MISC USE AS DIRECTED EVERY 30 DAYS    . citalopram (CELEXA) 10 MG tablet TAKE 1 TABLET(10 MG) BY MOUTH TWICE DAILY 60 tablet 3  . clopidogrel (PLAVIX) 75 MG tablet Take 1 tablet (75 mg total) by mouth daily. 30 tablet 11  . cyanocobalamin (,VITAMIN B-12,) 1000 MCG/ML injection Inject 1 mL (1,000 mcg total) into the muscle every 30 (thirty) days. 1 mL 12  . diclofenac Sodium (VOLTAREN) 1 % GEL APPLY 4 GRAMS EXTERNALLY TO THE AFFECTED AREA FOUR TIMES DAILY 100 g 0  . donepezil  (ARICEPT) 5 MG tablet Take 5 mg by mouth daily.    . famotidine (PEPCID) 20 MG tablet TAKE 1 TABLET(20 MG) BY MOUTH AT BEDTIME 90 tablet 1  . fenofibrate micronized (LOFIBRA) 200 MG capsule TAKE 1 CAPSULE(200 MG) BY MOUTH DAILY 90 capsule 1  . gabapentin (NEURONTIN) 100 MG capsule 1 cap in AM, 1 cap in PM, 2 caps QHS 120 capsule 3  . hydrochlorothiazide (HYDRODIURIL) 25 MG tablet TAKE 1 TABLET(25 MG) BY MOUTH DAILY 90 tablet 1  . HYDROcodone-acetaminophen (NORCO/VICODIN) 5-325 MG tablet Take 1 tablet by mouth every 6 (six) hours as needed for moderate pain or severe pain. 25 tablet 0  . Insulin Syringe 27G X 1/2" 1 ML MISC 1 each by Does not apply route every 30 (thirty) days. 10 each 3  . isosorbide mononitrate (IMDUR) 30 MG 24 hr tablet Take 60 mg (2 tablets) in the morning and 31m (1 tablet) in the evening. 270 tablet 1  . lidocaine (LIDODERM) 5 % 2 patches daily.    .Marland Kitchenlisinopril (ZESTRIL) 40 MG tablet TAKE 1 TABLET(40 MG) BY MOUTH TWICE DAILY 180 tablet 1  . loperamide (IMODIUM) 2 MG capsule Take 2 mg by mouth as needed for diarrhea or loose stools.    . nitroGLYCERIN (NITROSTAT) 0.4 MG SL tablet Place 0.4 mg under the tongue every 5 (five)  minutes as needed for chest pain.     . nortriptyline (PAMELOR) 50 MG capsule TAKE 1 CAPSULE(50 MG) BY MOUTH AT BEDTIME 90 capsule 1  . omeprazole (PRILOSEC) 40 MG capsule TAKE 1 CAPSULE BY MOUTH TWICE DAILY 180 capsule 1  . ondansetron (ZOFRAN) 4 MG tablet Take 1 tablet (4 mg total) by mouth every 6 (six) hours as needed for nausea or vomiting. 90 tablet 1  . oxyCODONE-acetaminophen (PERCOCET/ROXICET) 5-325 MG tablet Take 1 tablet by mouth every 6 (six) hours as needed.    . predniSONE (DELTASONE) 20 MG tablet Take 20 mg by mouth as needed.    . pregabalin (LYRICA) 75 MG capsule 1 cap qHS for 1 week, then increase to 1 cap BID for 1 week, then 1 cap qAM and 2 caps qPM for 1 week, then 2 caps BID until you see me 120 capsule 1  . traZODone (DESYREL) 50 MG  tablet Take 50 mg by mouth at bedtime.    . triamcinolone ointment (KENALOG) 0.5 % Apply 1 application topically 2 (two) times daily. 30 g 0  . ALPRAZolam (XANAX) 0.5 MG tablet Take 1 tablet (0.5 mg total) by mouth daily as needed for anxiety. (Patient not taking: Reported on 12/08/2019) 30 tablet 0   Current Facility-Administered Medications  Medication Dose Route Frequency Provider Last Rate Last Admin  . cyanocobalamin ((VITAMIN B-12)) injection 1,000 mcg  1,000 mcg Intramuscular Q30 days Valerie Roys, DO        Past Medical History:  Diagnosis Date  . Anxiety   . Arthritis   . Barrett's syndrome   . Carpal tunnel syndrome   . Depression   . GERD (gastroesophageal reflux disease)   . Heart murmur   . Hyperlipidemia   . Hypertension   . Myocardial infarction ([place])    08  . PAD (peripheral artery disease) ([place])     Past Surgical History:  Procedure Laterality Date  . ABDOMINAL AORTOGRAM W/LOWER EXTREMITY N/A 07/28/2019   Procedure: ABDOMINAL AORTOGRAM W/LOWER EXTREMITY;  Surgeon: Wellington Hampshire, MD;  Location: [place] [facility];  Service: Cardiovascular;  Laterality: N/A;  . ABDOMINAL HYSTERECTOMY    . ARTERY BIOPSY Right 07/12/2019   Procedure: BIOPSY TEMPORAL ARTERY;  Surgeon: Olean Ree, MD;  Location: [facility];  Service: General;  Laterality: Right;  . BREAST BIOPSY Left 2007   [place]  . CARDIAC CATHETERIZATION    . CARDIAC VALVE REPLACEMENT    . CHOLECYSTECTOMY    . COLON SURGERY    . COLONOSCOPY WITH PROPOFOL N/A 04/09/2019   Procedure: COLONOSCOPY WITH PROPOFOL;  Surgeon: Virgel Manifold, MD;  Location: [facility];  Service: Endoscopy;  Laterality: N/A;  . CORONARY ANGIOPLASTY WITH STENT PLACEMENT  2008   [place]; [facility] Stent placement x 1   . DENTAL SURGERY    . ESOPHAGOGASTRODUODENOSCOPY (EGD) WITH PROPOFOL N/A 04/09/2019   Procedure: ESOPHAGOGASTRODUODENOSCOPY (EGD) WITH PROPOFOL;  Surgeon: Virgel Manifold, MD;   Location: [facility];  Service: Endoscopy;  Laterality: N/A;  . EXCISIONAL HEMORRHOIDECTOMY    . EYE SURGERY    . FEMORAL-POPLITEAL BYPASS GRAFT Right 08/02/2019   Procedure: BYPASS GRAFT FEMORAL-POPLITEAL ARTERY;  Surgeon: Rosetta Posner, MD;  Location: [place];  Service: Vascular;  Laterality: Right;  . HERNIA REPAIR    . LOWER EXTREMITY ANGIOGRAPHY Left 11/11/2019   Procedure: LOWER EXTREMITY ANGIOGRAPHY;  Surgeon: Algernon Huxley, MD;  Location: [place] [facility];  Service: Cardiovascular;  Laterality: Left;  .  MULTIPLE TOOTH EXTRACTIONS    . TONSILLECTOMY       Social History   Tobacco Use  . Smoking status: Light Tobacco Smoker    Packs/day: 0.25    Years: 3.00    Pack years: 0.75    Types: Cigarettes  . Smokeless tobacco: Never Used  . Tobacco comment: "  3 cigarettes per day "  Substance Use Topics  . Alcohol use: Never  . Drug use: Never      Family History  Adopted: Yes  Problem Relation Age of Onset  . Breast cancer Mother 51    No Known Allergies  REVIEW OF SYSTEMS (Negative unless checked)  Constitutional: _0 ?Weight loss  _1 ?Fever  _2 ?Chills Cardiac: _3 ?Chest pain   _4 ?Chest pressure   _5 ?Palpitations   _6 ?Shortness of breath when laying flat   _7 ?Shortness of breath at rest   _8 ?Shortness of breath with exertion. Vascular:  _9 ?Pain in legs with walking   _10 ?Pain in legs at rest   _11 ?Pain in legs when laying flat   _12 ?Claudication   _13 ?Pain in feet when walking  _14 ?Pain in feet at rest  _15 ?Pain in feet when laying flat   _16 ?History of DVT   _17 ?Phlebitis   _18 ?Swelling in legs   _19 ?Varicose veins   _20 ?Non-healing ulcers Pulmonary:   _21 ?Uses home oxygen   _22 ?Productive cough   _23 ?Hemoptysis   _24 ?Wheeze  _25 ?COPD   _26 ?Asthma Neurologic:  _27 ?Dizziness  _28 ?Blackouts   _29 ?Seizures   _30 ?History of stroke   _31 ?History of TIA  _32 ?Aphasia   _33 ?Temporary blindness   _34 ?Dysphagia   _35 ?Weakness or numbness in arms   _36 ?Weakness or numbness in legs Musculoskeletal:   _37 ?Arthritis   _38 ?Joint swelling   _39 ?Joint pain   _40 ?Low back pain Hematologic:  _41 ?Easy bruising  _42 ?Easy bleeding   _43 ?Hypercoagulable state   _44 ?Anemic  _45 ?Hepatitis Gastrointestinal:  _46 ?Blood in stool   _47 ?Vomiting blood  _48 ?Gastroesophageal reflux/heartburn   _49 ?Abdominal pain Genitourinary:  _50 ?Chronic kidney disease   _51 ?Difficult urination  _52 ?Frequent urination  _53 ?Burning with urination   _54 ?Hematuria Skin:  _55 ?Rashes   _56 ?Ulcers   _57 ?Wounds Psychological:  _58 ?History of anxiety   _59 ? History of major depression.  Physical Examination  BP (!) 149/84 (BP Location: Right Arm)   Pulse 86   Resp 16   Wt 180 lb 6.4 oz (81.8 kg)   BMI 30.97 kg/m  Gen:  WD/WN, NAD Head: [place]/AT, No temporalis wasting. Ear/Nose/Throat: Hearing grossly intact, nares w/o erythema or drainage Eyes: Conjunctiva clear. Sclera non-icteric Neck: Supple.  Trachea midline Pulmonary:  Good air movement, no use of accessory muscles.  Cardiac: RRR, no JVD Vascular:  Vessel Right Left  Radial Palpable Palpable                          PT Palpable Palpable  DP Palpable Palpable   Gastrointestinal: soft, non-tender/non-distended. No guarding/reflex.  Musculoskeletal: M/S 5/5 throughout.  No deformity or atrophy.  Mild right lower extremity edema. Neurologic: Sensation grossly intact in extremities.  Symmetrical.  Speech is fluent.  Psychiatric: Judgment intact, Mood & affect appropriate for pt's clinical situation. Dermatologic: No rashes or ulcers noted.  No cellulitis or open wounds.       Labs Recent Results (from the past 2160 hour(s))  Bayer DCA Hb A1c Waived     Status: None   Collection Time: 10/01/19  4:01 PM  Result Value Ref Range   HB A1C (BAYER DCA - WAIVED) 5.8 <7.0 %  Comment:                                       Diabetic Adult            <7.0                                       Healthy Adult        4.3 - 5.7                                                            (DCCT/NGSP) American Diabetes Association's Summary of Glycemic Recommendations for Adults with Diabetes: Hemoglobin A1c <7.0%. More stringent glycemic goals (A1c <6.0%) may further reduce complications at the cost of increased risk of hypoglycemia.   CBC with Differential/Platelet     Status: Abnormal   Collection Time: 10/01/19  4:03 PM  Result Value Ref Range   WBC 7.0 3.4 - 10.8 x10E3/uL   RBC 5.43 (H) 3.77 - 5.28 x10E6/uL   Hemoglobin 15.4 11.1 - 15.9 g/dL   Hematocrit 46.2 34.0 - 46.6 %   MCV 85 79 - 97 fL   MCH 28.4 26.6 - 33.0 pg   MCHC 33.3 31.5 - 35.7 g/dL   RDW 13.2 11.7 - 15.4 %   Platelets 207 150 - 450 x10E3/uL   Neutrophils 69 Not Estab. %   Lymphs 24 Not Estab. %   Monocytes 6 Not Estab. %   Eos 1 Not Estab. %   Basos 0 Not Estab. %   Neutrophils Absolute 4.8 1.4 - 7.0 x10E3/uL   Lymphocytes Absolute 1.7 0.7 - 3.1 x10E3/uL   Monocytes Absolute 0.4 0.1 - 0.9 x10E3/uL   EOS (ABSOLUTE) 0.1 0.0 - 0.4 x10E3/uL   Basophils Absolute 0.0 0.0 - 0.2 x10E3/uL   Immature Granulocytes 0 Not Estab. %   Immature Grans (Abs) 0.0 0.0 - 0.1 x10E3/uL  Comprehensive metabolic panel     Status: Abnormal   Collection Time: 10/01/19  4:03 PM  Result Value Ref Range   Glucose 118 (H) 65 - 99 mg/dL   BUN 21 8 - 27 mg/dL   Creatinine, Ser 0.74 0.57 - 1.00 mg/dL   GFR calc non Af Amer 82 >59 mL/min/1.73   GFR calc Af Amer 94 >59 mL/min/1.73   BUN/Creatinine Ratio 28 12 - 28   Sodium 142 134 - 144 mmol/L   Potassium 4.6 3.5 - 5.2 mmol/L   Chloride 105 96 - 106 mmol/L   CO2 23 20 - 29 mmol/L   Calcium 10.3 8.7 - 10.3 mg/dL   Total Protein 6.9 6.0 - 8.5 g/dL   Albumin 4.2 3.7 - 4.7 g/dL   Globulin, Total 2.7 1.5 - 4.5 g/dL   Albumin/Globulin Ratio 1.6 1.2 - 2.2   Bilirubin Total 0.3 0.0 - 1.2 mg/dL   Alkaline Phosphatase 117 39 - 117 IU/L   AST 17 0 - 40 IU/L   ALT 9 0 - 32 IU/L  Lipid Panel w/o Chol/HDL Ratio     Status: Abnormal   Collection Time: 10/01/19  4:03 PM  Result  Value Ref Range  Cholesterol, Total 210 (H) 100 - 199 mg/dL   Triglycerides 172 (H) 0 - 149 mg/dL   HDL 47 >39 mg/dL   VLDL Cholesterol Cal 31 5 - 40 mg/dL   LDL Chol Calc (NIH) 132 (H) 0 - 99 mg/dL  TSH     Status: None   Collection Time: 10/01/19  4:03 PM  Result Value Ref Range   TSH 2.500 0.450 - 4.500 uIU/mL  B12     Status: Abnormal   Collection Time: 10/01/19  4:03 PM  Result Value Ref Range   Vitamin B-12 141 (L) 232 - 1,245 pg/mL  SARS CORONAVIRUS 2 (TAT 6-24 HRS) Nasopharyngeal Nasopharyngeal Swab     Status: None   Collection Time: 11/09/19 10:43 AM   Specimen: Nasopharyngeal Swab  Result Value Ref Range   SARS Coronavirus 2 NEGATIVE NEGATIVE    Comment: (NOTE) SARS-CoV-2 target nucleic acids are NOT DETECTED. The SARS-CoV-2 RNA is generally detectable in upper and lower respiratory specimens during the acute phase of infection. Negative results do not preclude SARS-CoV-2 infection, do not rule out co-infections with other pathogens, and should not be used as the sole basis for treatment or other patient management decisions. Negative results must be combined with clinical observations, patient history, and epidemiological information. The expected result is Negative. Fact Sheet for Patients: SugarRoll.be Fact Sheet for Healthcare Providers: https://www.woods-mathews.com/ This test is not yet approved or cleared by the [place] FDA and  has been authorized for detection and/or diagnosis of SARS-CoV-2 by FDA under an Emergency Use Authorization (EUA). This EUA will remain  in effect (meaning this test can be used) for the duration of the COVID-19 declaration under Section 56 4(b)(1) of the Act, 21 U.S.C. section 360bbb-3(b)(1), unless the authorization is terminated or revoked sooner. Performed at [facility], [place] [address], [place], [place] [address]   BUN     Status: Abnormal   Collection Time: 11/11/19   7:41 AM  Result Value Ref Range   BUN 30 (H) 8 - 23 mg/dL    Comment: Performed at [facility], [address] [place], [place] [address]  Creatinine, serum     Status: None   Collection Time: 11/11/19  7:41 AM  Result Value Ref Range   Creatinine, Ser 0.72 0.44 - 1.00 mg/dL   GFR calc non Af Amer >60 >60 mL/min   GFR calc Af Amer >60 >60 mL/min    Comment: Performed at [facility], [address] [place], [place] [address]  UA/M w/rflx Culture, Routine     Status: Abnormal   Collection Time: 11/12/19  2:31 PM   Specimen: Urine   URINE  Result Value Ref Range   Specific Gravity, UA >1.030 (H) 1.005 - 1.030   pH, UA 5.0 5.0 - 7.5   Color, UA Yellow Yellow   Appearance Ur Clear Clear   Leukocytes,UA Negative Negative   Protein,UA 3+ (A) Negative/Trace   Glucose, UA Negative Negative   Ketones, UA Negative Negative   RBC, UA Negative Negative   Bilirubin, UA Negative Negative   Urobilinogen, Ur 0.2 0.2 - 1.0 mg/dL   Nitrite, UA Negative Negative   Microscopic Examination See below:   Microalbumin, Urine Waived     Status: Abnormal   Collection Time: 11/12/19  2:31 PM  Result Value Ref Range   Microalb, Ur Waived 150 (H) 0 - 19 mg/L   Creatinine, Urine Waived 200 10 - 300 mg/dL   Microalb/Creat Ratio 30-300 (  H) <30 mg/g    Comment:                              Abnormal:       30 - 300                         High Abnormal:           >300   Microscopic Examination     Status: Abnormal   Collection Time: 11/12/19  2:31 PM   URINE  Result Value Ref Range   WBC, UA 0-5 0 - 5 /hpf   RBC None seen 0 - 2 /hpf   Epithelial Cells (non renal) 0-10 0 - 10 /hpf   Bacteria, UA Few (A) None seen/Few  CBC with Differential/Platelet     Status: Abnormal   Collection Time: 11/12/19  2:34 PM  Result Value Ref Range   WBC 6.5 3.4 - 10.8 x10E3/uL   RBC 5.67 (H) 3.77 - 5.28 x10E6/uL   Hemoglobin 16.0 (H) 11.1 - 15.9 g/dL   Hematocrit 48.2 (H) 34.0 - 46.6 %   MCV 85  79 - 97 fL   MCH 28.2 26.6 - 33.0 pg   MCHC 33.2 31.5 - 35.7 g/dL   RDW 13.9 11.7 - 15.4 %   Platelets 170 150 - 450 x10E3/uL   Neutrophils 71 Not Estab. %   Lymphs 21 Not Estab. %   Monocytes 7 Not Estab. %   Eos 1 Not Estab. %   Basos 0 Not Estab. %   Neutrophils Absolute 4.6 1.4 - 7.0 x10E3/uL   Lymphocytes Absolute 1.4 0.7 - 3.1 x10E3/uL   Monocytes Absolute 0.5 0.1 - 0.9 x10E3/uL   EOS (ABSOLUTE) 0.1 0.0 - 0.4 x10E3/uL   Basophils Absolute 0.0 0.0 - 0.2 x10E3/uL   Immature Granulocytes 0 Not Estab. %   Immature Grans (Abs) 0.0 0.0 - 0.1 O96E9/BM  Basic metabolic panel     Status: Abnormal   Collection Time: 11/12/19  2:34 PM  Result Value Ref Range   Glucose 97 65 - 99 mg/dL   BUN 24 8 - 27 mg/dL   Creatinine, Ser 0.78 0.57 - 1.00 mg/dL   GFR calc non Af Amer 77 >59 mL/min/1.73   GFR calc Af Amer 88 >59 mL/min/1.73    Comment: **[facility] currently reports eGFR in compliance with the current**   recommendations of the [employer]. [facility] will   update reporting as new guidelines are published from the NKF-ASN   Task force.    BUN/Creatinine Ratio 31 (H) 12 - 28   Sodium 143 134 - 144 mmol/L   Potassium 4.3 3.5 - 5.2 mmol/L   Chloride 105 96 - 106 mmol/L   CO2 26 20 - 29 mmol/L   Calcium 9.4 8.7 - 10.3 mg/dL  B12     Status: None   Collection Time: 11/12/19  2:34 PM  Result Value Ref Range   Vitamin B-12 1,185 232 - 1,245 pg/mL    Radiology No results found.  Assessment/Plan HTN (hypertension) blood pressure control important in reducing the progression of atherosclerotic disease. On appropriate oral medications.   Hyperlipidemia lipid control important in reducing the progression of atherosclerotic disease. Continue statin therapy   Lymphedema The patient has developed lymphedema after her femoral to popliteal bypass.  She seems to have stage II lymphedema which is refractory to elevation as well  as some early dermal thickening. I  discussed with the patient this was not from any fault of the surgeon and does happen after some surgeries particularly vascular surgery of the right lower extremity.  I discussed this is likely to be a chronic condition and is best managed with compression stockings, elevation, and increasing her activity.  A lymphedema pump would be an excellent adjuvant therapy to improve her swelling and hopefully help with her pain.    Nerve pain Patient appears to have significant neuropathic pain of the right leg after her bypass surgery.  I have discussed that this could be from a result of limited perfusion at or before the time of her surgery, injury of the nerve at the time of the surgery, or it may not be clear what the cause.  Either way, there is really not much we can do for that at this point.  Hopefully this will improve some over time.  Did not improve with gabapentin.  Atherosclerosis of native arteries of extremity with intermittent claudication (HCC) Her ABIs today were normal at 1.01 on the right and 0.99 on the left with triphasic waveforms and normal digital pressures bilaterally consistent with no current arterial insufficiency.  At this point, she should do 3 months of Plavix from her most recent intervention which was about a month ago.  She can then switch to 325 aspirin daily.  We discussed the lymphedema pump and beginning to use that once or twice daily.  We also discussed the use of compression stockings regularly but not sleeping them at night.  She voices her understanding and we will plan to see her back in 6 months with noninvasive studies.    Leotis Pain, MD  12/14/2019 12:10 PM    This note was created with Dragon medical transcription system.  Any errors from dictation are purely unintentional

## 2020-01-06 ENCOUNTER — Other Ambulatory Visit: Payer: Self-pay | Admitting: Family Medicine

## 2020-03-06 ENCOUNTER — Other Ambulatory Visit: Payer: Self-pay | Admitting: Family Medicine

## 2020-03-06 NOTE — Telephone Encounter (Signed)
LOV 12/08/19

## 2020-03-06 NOTE — Telephone Encounter (Signed)
Requested medication (s) are due for refill today: yes  Requested medication (s) are on the active medication list: yes  Last refill:  02/05/2020  Future visit scheduled: no  Notes to clinic: Medication not assigned to a protocol, review manually   Requested Prescriptions  Pending Prescriptions Disp Refills   cyanocobalamin (,VITAMIN B-12,) 1000 MCG/ML injection [Pharmacy Med Name: CYANOCOBALAMIN 1000MCG/ML INJ, 1ML] 1 mL 12    Sig: ADMINISTER 1 ML(1000 MCG) IN THE MUSCLE EVERY 30 DAYS      Off-Protocol Failed - 03/06/2020  7:09 AM      Failed - Medication not assigned to a protocol, review manually.      Passed - Valid encounter within last 12 months    Recent Outpatient Visits           2 months ago Polyneuropathy, unspecified   [facility] [place], Megan P, DO   3 months ago Essential hypertension   [facility] [place], [place], DO   5 months ago Memory loss   [facility], [place], DO   7 months ago Temporal pain   [facility] [place], [place], DO   8 months ago Temporal pain   [facility] [place], [place] P, DO             Off-Protocol Failed - 03/06/2020  7:09 AM      Failed - Medication not assigned to a protocol, review manually.      Passed - Valid encounter within last 12 months    Recent Outpatient Visits           2 months ago Polyneuropathy, unspecified   [facility] [place], [place], DO   3 months ago Essential hypertension   [facility] [place], [place], DO   5 months ago Memory loss   [facility], [place], DO   7 months ago Temporal pain   [facility], [place], DO   8 months ago Temporal pain   [facility] [place], [place], DO

## 2020-03-09 DIAGNOSIS — G459 Transient cerebral ischemic attack, unspecified: Secondary | ICD-10-CM | POA: Insufficient documentation

## 2020-03-09 DIAGNOSIS — R262 Difficulty in walking, not elsewhere classified: Secondary | ICD-10-CM | POA: Insufficient documentation

## 2020-04-05 ENCOUNTER — Other Ambulatory Visit: Payer: Self-pay | Admitting: Family Medicine

## 2020-04-24 ENCOUNTER — Other Ambulatory Visit: Payer: Self-pay | Admitting: Neurology

## 2020-04-24 DIAGNOSIS — G459 Transient cerebral ischemic attack, unspecified: Secondary | ICD-10-CM

## 2020-04-27 ENCOUNTER — Other Ambulatory Visit: Payer: Self-pay | Admitting: Family Medicine

## 2020-04-27 NOTE — Telephone Encounter (Signed)
Toni Amend, from [facility], called stating that they sent advanced home care with the pt so the pt could have PT. They states that the pt os having BP issues and that the top number is 210 AND 220. She states that the pt states that she is taking BP medication, but the pill bottles that pt showed PT were from 2018. Please advise.     [phone]

## 2020-04-28 NOTE — Telephone Encounter (Signed)
Please call patient/home health agency.  Patient was last seen in April and prescriptions should be current; she should not be out of her blood pressure medication.  Was the patient symptomatic with these high BP readings (dizziness, vision changes, headache, chest pain, or shortness of breath?) If so, she needs to go to ER.  If not, needs appointment with [place] next week.

## 2020-05-01 ENCOUNTER — Ambulatory Visit: Payer: Federal, State, Local not specified - PPO | Admitting: Family

## 2020-05-01 ENCOUNTER — Encounter: Payer: Self-pay | Admitting: Family

## 2020-05-01 ENCOUNTER — Other Ambulatory Visit: Payer: Self-pay

## 2020-05-01 VITALS — BP 150/90 | HR 87 | Ht 64.0 in | Wt 185.4 lb

## 2020-05-01 DIAGNOSIS — G459 Transient cerebral ischemic attack, unspecified: Secondary | ICD-10-CM | POA: Diagnosis not present

## 2020-05-01 DIAGNOSIS — I739 Peripheral vascular disease, unspecified: Secondary | ICD-10-CM

## 2020-05-01 DIAGNOSIS — Z720 Tobacco use: Secondary | ICD-10-CM

## 2020-05-01 DIAGNOSIS — I25118 Atherosclerotic heart disease of native coronary artery with other forms of angina pectoris: Secondary | ICD-10-CM

## 2020-05-01 DIAGNOSIS — I10 Essential (primary) hypertension: Secondary | ICD-10-CM

## 2020-05-01 DIAGNOSIS — I359 Nonrheumatic aortic valve disorder, unspecified: Secondary | ICD-10-CM

## 2020-05-01 DIAGNOSIS — E782 Mixed hyperlipidemia: Secondary | ICD-10-CM

## 2020-05-01 MED ORDER — NITROGLYCERIN 0.4 MG SL SUBL: 0.4000 mg | SUBLINGUAL_TABLET | SUBLINGUAL | 3 refills | Status: AC | PRN

## 2020-05-01 MED ORDER — ISOSORBIDE MONONITRATE ER 60 MG PO TB24: 60.0000 mg | ORAL_TABLET | Freq: Two times a day (BID) | ORAL | 1 refills | Status: DC

## 2020-05-01 NOTE — Telephone Encounter (Signed)
Called and spoke with patient. She states she is not out of her BP medication but will be soon. States she has not had any symptoms related to HTN. Did see her cardiologist this morning and states that her BP has been doing ok. She states that she gets anxious every now and then and her BP goes up. Advised patient to please let us know if she starts having symptoms or if BP readings continue to be elevated.   Patient also mentioned on the phone that she needed refills on Ondansetron, Lyrica, and Lisinopril sent to [facility]. Routing to Dr. Laural Benes for these. Clarified Lyrica directions and patient states she is taking 1 capsule BID. Please clarify on RX when sending to pharmacy.

## 2020-05-01 NOTE — Patient Instructions (Addendum)
Medication Instructions:  Your physician has recommended you make the following change in your medication:   CHANGE Imdur to 60mg  twice daily  *If you need a refill on your cardiac medications before your next appointment, please call your pharmacy*  Lab Work: None ordered today.  Testing/Procedures:  Your physician has requested that you have a carotid duplex. This test is an ultrasound of the carotid arteries in your neck. It looks at blood flow through these arteries that supply the brain with blood. Allow one hour for this exam. There are no restrictions or special instructions.  Your physician has requested that you have a lexiscan myoview. Please follow instruction sheet, as given.   Follow-Up: At [facility], you and your health needs are our priority.  As part of our continuing mission to provide you with exceptional heart care, we have created designated Provider Care Teams.  These Care Teams include your primary Cardiologist (physician) and Advanced Practice Providers (APPs -  Physician Assistants and Nurse Practitioners) who all work together to provide you with the care you need, when you need it.  We recommend signing up for the patient portal called "MyChart".  Sign up information is provided on this After Visit Summary.  MyChart is used to connect with patients for Virtual Visits (Telemedicine).  Patients are able to view lab/test results, encounter notes, upcoming appointments, etc.  Non-urgent messages can be sent to your provider as well.   To learn more about what you can do with MyChart, go to [facility].    Your next appointment:   After your carotid duplex and stress test  The format for your next appointment:   In Person or Virtual  Provider:   You may see [email or web address], MD or one of the following Advanced Practice Providers on your designated Care Team:    Lorine Bears, NP  Nicolasa Ducking, PA-C  Eula Listen, PA-C  Cadence Questa,  [place]  [place], NP   Other Instructions  [facility] MYOVIEW  Your caregiver has ordered a Stress Test with nuclear imaging. The purpose of this test is to evaluate the blood supply to your heart muscle. This procedure is referred to as a "Non-Invasive Stress Test." This is because other than having an IV started in your vein, nothing is inserted or "invades" your body. Cardiac stress tests are done to find areas of poor blood flow to the heart by determining the extent of coronary artery disease (CAD). Some patients exercise on a treadmill, which naturally increases the blood flow to your heart, while others who are  unable to walk on a treadmill due to physical limitations have a pharmacologic/chemical stress agent called Lexiscan . This medicine will mimic walking on a treadmill by temporarily increasing your coronary blood flow.   Please note: these test may take anywhere between 2-4 hours to complete  PLEASE REPORT TO [facility] MEDICAL MALL ENTRANCE  THE VOLUNTEERS AT THE FIRST DESK WILL DIRECT YOU WHERE TO GO  Date of Procedure:_____________________________________  Arrival Time for Procedure:______________________________  Instructions regarding medication:    _X___:  Hold other medications as follows: DO NOT take HCTZ the morning of the test.   PLEASE NOTIFY THE OFFICE AT LEAST 24 HOURS IN ADVANCE IF YOU ARE UNABLE TO KEEP YOUR APPOINTMENT.  [phone] AND  PLEASE NOTIFY NUCLEAR MEDICINE AT [facility] AT LEAST 24 HOURS IN ADVANCE IF YOU ARE UNABLE TO KEEP YOUR APPOINTMENT. [phone]  How to prepare for your Myoview test:  1. Do not eat or  drink after midnight 2. No caffeine for 24 hours prior to test 3. No smoking 24 hours prior to test. 4. Your medication may be taken with water.  If your doctor stopped a medication because of this test, do not take that medication. 5. Ladies, please do not wear dresses.  Skirts or pants are appropriate. Please wear a short sleeve shirt. 6. No  perfume, cologne or lotion. 7. Wear comfortable walking shoes. No heels!

## 2020-05-01 NOTE — Progress Notes (Signed)
Office Visit    Patient Name: Tara [name] Date of Encounter: 05/01/2020  Primary Care Provider:  Dorcas Carrow, DO Primary Cardiologist:  Lorine Bears, MD Electrophysiologist:  None   Chief Complaint    Tara [name] is a 74 y.o. female with a hx of CAD s/p stent with unknown details in [place], AVR with tissue valve 2016 in [place] for aortic insufficiency, tobacco use, HTN, HLD, PAD presents today for discussion regarding carotid duplex.   Past Medical History    Past Medical History:  Diagnosis Date  . Anxiety   . Arthritis   . Barrett's syndrome   . Carpal tunnel syndrome   . Depression   . GERD (gastroesophageal reflux disease)   . Heart murmur   . Hyperlipidemia   . Hypertension   . Myocardial infarction (HCC)    08  . PAD (peripheral artery disease) (HCC)    Past Surgical History:  Procedure Laterality Date  . ABDOMINAL AORTOGRAM W/LOWER EXTREMITY N/A 07/28/2019   Procedure: ABDOMINAL AORTOGRAM W/LOWER EXTREMITY;  Surgeon: Iran Ouch, MD;  Location: [facility] INVASIVE CV LAB;  Service: Cardiovascular;  Laterality: N/A;  . ABDOMINAL HYSTERECTOMY    . ARTERY BIOPSY Right 07/12/2019   Procedure: BIOPSY TEMPORAL ARTERY;  Surgeon: Henrene Dodge, MD;  Location: [facility] ORS;  Service: General;  Laterality: Right;  . BREAST BIOPSY Left 2007   Neg- [place]  . CARDIAC CATHETERIZATION    . CARDIAC VALVE REPLACEMENT    . CHOLECYSTECTOMY    . COLON SURGERY    . COLONOSCOPY WITH PROPOFOL N/A 04/09/2019   Procedure: COLONOSCOPY WITH PROPOFOL;  Surgeon: Pasty Spillers, MD;  Location: [facility] ENDOSCOPY;  Service: Endoscopy;  Laterality: N/A;  . CORONARY ANGIOPLASTY WITH STENT PLACEMENT  2008   [place]; [facility] Stent placement x 1   . DENTAL SURGERY    . ESOPHAGOGASTRODUODENOSCOPY (EGD) WITH PROPOFOL N/A 04/09/2019   Procedure: ESOPHAGOGASTRODUODENOSCOPY (EGD) WITH PROPOFOL;  Surgeon: Pasty Spillers, MD;  Location: [facility] ENDOSCOPY;   Service: Endoscopy;  Laterality: N/A;  . EXCISIONAL HEMORRHOIDECTOMY    . EYE SURGERY    . FEMORAL-POPLITEAL BYPASS GRAFT Right 08/02/2019   Procedure: BYPASS GRAFT FEMORAL-POPLITEAL ARTERY;  Surgeon: Larina Earthly, MD;  Location: [facility] OR;  Service: Vascular;  Laterality: Right;  . HERNIA REPAIR    . LOWER EXTREMITY ANGIOGRAPHY Left 11/11/2019   Procedure: LOWER EXTREMITY ANGIOGRAPHY;  Surgeon: Annice Needy, MD;  Location: [facility] INVASIVE CV LAB;  Service: Cardiovascular;  Laterality: Left;  Marland Kitchen MULTIPLE TOOTH EXTRACTIONS    . TONSILLECTOMY      Allergies  No Known Allergies  History of Present Illness    Tara [name] is a 74 y.o. female with a hx of CAD s/p stent with unknown details in [place], AVR with tissue valve 2016 in [place] for aortic insufficiency, tobacco use, HTN, HLD, PAD. She was last seen in clinic 07/13/19.  Hx CAD previous stent in 2008 in [place]. AVR with tissue valve 2016 in [place] for AI. She currently smokes 3 cigarettes a day and has not been able to quit since her son committed suicide 3 years ago.  She is not interested in quitting.    She had cardiac work-up [facility] [place] in March 2020.  Echocardiogram 09/2018 with normal LVEF she had a Lexiscan with no evidence of ischemia.  She had a lower extremity arterial Doppler which showed significant left SFA disease.  She had a temporal  artery biopsy done 07/12/19.   Seen in clinic 06/2019. Reported 3 month history of  "charley horses" in her bilateral calves with ambulation. Her chief complaint at that time was a shooting pain in her left upper arm radiating to scapula similar to her anginal equivalent requiring 2 nitroglycerin. Her AM dose of Imdur was increased to 60mg . She was recommended for aortogram.  She had angiography with Dr. 07/28/19 demonstrating right SFA occlusion at origin with reconstitution at the above the knee popliteal. She was recommended for right femoral to above-knee popliteal  bypass which was performed 08/02/19 by Dr. 09/30/19.   Angiogram 11/11/19 with Dr. 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  12/14/19 she had normal ABI with 1.01 on the right and 0.99 on the left with triphasic waveforms. She was recommended for compression, elevation, and increased activity for her lyphedema. She noted significant neuropathic pain in the RLE after procedure.   She has been following with Dr. 12/16/19 of neurology for mild memory loss. She has been started on Aricept. Of note she noted an episode of difficulty walking, imbalance, right-sided numbness, vision changes in June with residual difficulty walking and balance. She was recommended for brain MRI as well as carotid duplex and request to schedule today.  Present today with her daughter. Notes increased episodes of left arm pain radiating to her left scapula. This is similar to her anginal equivalent.Taking nitroglycerin 2-4 times per week. Typically resolved with one but occasionally will have to take 2.  Tells me in the last month she h has noticed the episodes are occurring more frequently. Not associated with shortness of breath nor diaphoresis.   Reports no shortness of breath at rest nor dyspnea on exertion. Denies amaurosis fugax. No near syncope nor syncope.  EKGs/Labs/Other Studies Reviewed:   The following studies were reviewed today: 07/13/19 VAS 07/15/19 Lower Ext Art Seg Summary: Right: Resting right ankle-brachial index indicates severe right lower extremity arterial disease. The right toe-brachial index is abnormal.   Left: Resting left ankle-brachial index indicates moderate left lower extremity arterial disease. The left toe-brachial index is abnormal.  VAS [place] lower extremity arterial bilateral Summary: Right: Total occlusion noted in the superficial femoral artery.   Left: 75-99% stenosis noted in the superficial femoral artery. 75-99% stenosis noted in the superficial femoral artery and/or popliteal artery. Total occlusion noted in  the peroneal artery.    EKG:  EKG is ordered today.  The ekg ordered today demonstrates SR 74 bpm with LVH. No acute ST/T wave changes.   Recent Labs: 10/01/2019: ALT 9; TSH 2.500 11/12/2019: BUN 24; Creatinine, Ser 0.78; Hemoglobin 16.0; Platelets 170; Potassium 4.3; Sodium 143  Recent Lipid Panel    Component Value Date/Time   CHOL 210 (H) 10/01/2019 1603   TRIG 172 (H) 10/01/2019 1603   HDL 47 10/01/2019 1603   CHOLHDL 5.5 07/13/2019 1615   VLDL 48 (H) 07/13/2019 1615   LDLCALC 132 (H) 10/01/2019 1603   LDLDIRECT 145.7 (H) 07/13/2019 1615    Home Medications   Current Meds  Medication Sig  . ALPRAZolam (XANAX) 0.5 MG tablet Take 1 tablet (0.5 mg total) by mouth daily as needed for anxiety.  07/15/2019 amLODipine (NORVASC) 10 MG tablet Take 1 tablet (10 mg total) by mouth daily.  Marland Kitchen amoxicillin-clavulanate (AUGMENTIN) 875-125 MG tablet Take 1 tablet by mouth daily as needed.  Marland Kitchen aspirin EC 81 MG tablet Take 1 tablet (81 mg total) by mouth daily.  Marland Kitchen atorvastatin (LIPITOR) 40 MG tablet Take 1 tablet (  40 mg total) by mouth daily.  . B-D TB SYRINGE 1CC/27GX1/2" 27G X 1/2" 1 ML MISC USE AS DIRECTED EVERY 30 DAYS  . citalopram (CELEXA) 10 MG tablet TAKE 1 TABLET(10 MG) BY MOUTH TWICE DAILY  . clopidogrel (PLAVIX) 75 MG tablet Take 1 tablet (75 mg total) by mouth daily.  . cyanocobalamin (,VITAMIN B-12,) 1000 MCG/ML injection ADMINISTER 1 ML(1000 MCG) IN THE MUSCLE EVERY 30 DAYS  . diclofenac Sodium (VOLTAREN) 1 % GEL APPLY 4 GRAMS EXTERNALLY TO THE AFFECTED AREA FOUR TIMES DAILY  . donepezil (ARICEPT) 5 MG tablet Take 5 mg by mouth daily.  . famotidine (PEPCID) 20 MG tablet TAKE 1 TABLET(20 MG) BY MOUTH AT BEDTIME  . fenofibrate micronized (LOFIBRA) 200 MG capsule TAKE 1 CAPSULE(200 MG) BY MOUTH DAILY  . gabapentin (NEURONTIN) 100 MG capsule 1 cap in AM, 1 cap in PM, 2 caps QHS  . hydrochlorothiazide (HYDRODIURIL) 25 MG tablet TAKE 1 TABLET(25 MG) BY MOUTH DAILY  . HYDROcodone-acetaminophen  (NORCO/VICODIN) 5-325 MG tablet Take 1 tablet by mouth every 6 (six) hours as needed for moderate pain or severe pain.  . Insulin Syringe 27G X 1/2" 1 ML MISC 1 each by Does not apply route every 30 (thirty) days.  . isosorbide mononitrate (IMDUR) 60 MG 24 hr tablet Take 1 tablet (60 mg total) by mouth in the morning and at bedtime.  . lidocaine (LIDODERM) 5 % 2 patches daily.  Marland Kitchen lisinopril (ZESTRIL) 40 MG tablet TAKE 1 TABLET(40 MG) BY MOUTH TWICE DAILY  . loperamide (IMODIUM) 2 MG capsule Take 2 mg by mouth as needed for diarrhea or loose stools.  . nitroGLYCERIN (NITROSTAT) 0.4 MG SL tablet Place 1 tablet (0.4 mg total) under the tongue every 5 (five) minutes as needed for chest pain.  . nortriptyline (PAMELOR) 50 MG capsule TAKE 1 CAPSULE(50 MG) BY MOUTH AT BEDTIME  . omeprazole (PRILOSEC) 40 MG capsule TAKE 1 CAPSULE BY MOUTH TWICE DAILY  . ondansetron (ZOFRAN) 4 MG tablet Take 1 tablet (4 mg total) by mouth every 6 (six) hours as needed for nausea or vomiting.  Marland Kitchen oxyCODONE-acetaminophen (PERCOCET/ROXICET) 5-325 MG tablet Take 1 tablet by mouth every 6 (six) hours as needed.  . predniSONE (DELTASONE) 20 MG tablet Take 20 mg by mouth as needed.  . pregabalin (LYRICA) 75 MG capsule 1 cap qHS for 1 week, then increase to 1 cap BID for 1 week, then 1 cap qAM and 2 caps qPM for 1 week, then 2 caps BID until you see me  . traZODone (DESYREL) 50 MG tablet Take 50 mg by mouth at bedtime.  . triamcinolone ointment (KENALOG) 0.5 % Apply 1 application topically 2 (two) times daily.  . [DISCONTINUED] isosorbide mononitrate (IMDUR) 30 MG 24 hr tablet Take 60 mg (2 tablets) in the morning and 30mg  (1 tablet) in the evening.  . [DISCONTINUED] nitroGLYCERIN (NITROSTAT) 0.4 MG SL tablet Place 0.4 mg under the tongue every 5 (five) minutes as needed for chest pain.    Current Facility-Administered Medications for the 05/01/20 encounter (Office Visit) with 07/01/20, NP  Medication  . cyanocobalamin  ((VITAMIN B-12)) injection 1,000 mcg    Review of Systems    Review of Systems  Constitutional: Negative for chills, fever and malaise/fatigue.  Cardiovascular: Negative for chest pain, claudication, dyspnea on exertion, leg swelling, near-syncope, orthopnea, palpitations and syncope.       (+) L arm pain (anginal equivalent)  Respiratory: Negative for cough, shortness of breath and wheezing.  Gastrointestinal: Negative for nausea and vomiting.  Neurological: Negative for dizziness, light-headedness and weakness.   All other systems reviewed and are otherwise negative except as noted above.  Physical Exam    VS:  BP (!) 150/90 (BP Location: Left Arm, Patient Position: Sitting, Cuff Size: Normal)   Pulse 87   Ht 5\' 4"  (1.626 m)   Wt 185 lb 6 oz (84.1 kg)   SpO2 97%   BMI 31.82 kg/m  , BMI Body mass index is 31.82 kg/m. GEN: Well nourished, well developed, in no acute distress. HEENT: normal. Neck: Supple, no JVD, carotid bruits, or masses. Cardiac: RRR, no rubs, or gallops. Gr2/6 systolic murmur in aortic area. No clubbing, cyanosis, edema.  Radials 2+ and equal bilaterally. DP 1+ bilaterally.  Respiratory:  Respirations regular and unlabored, clear to auscultation bilaterally. GI: Soft, nontender, nondistended, BS + x 4. MS: No deformity or atrophy. Skin: Warm and dry, no rash. Neuro:  Strength and sensation are intact. Psych: Normal affect.  Assessment & Plan    1. ?TIA - Seen by neurology with possible TIA in June. Carotid duplex ordered. Will defer ZIO monitor at this time as she reports no palpitations and has upcoming MRI and stress test.   2. PAD- Continue to follow with vascular surgery.   3. S/p bioprosthetic AVR - Grade 2 out of 6 systolic murmur stable on exam.  Echo 09/2018 normal functioning valve.  Continue to monitor.  4. CAD -Lexiscan 09/2018 with no evidence of ischemia. Notes increasing episodes of left arm pain associated with left scapular pain which is  her anginal equivalent. Taking nitroglycerin 2-4 times per week. Refill provided. Discussed Lexiscan Myoview versus cardiac catheterization-due to her recent significant vascular work-up she prefers for noninvasive methodology with [employer] Myoview. Increase Imdur to 60 mg twice daily. Continue GDMT Plavix, statin, Imdur, as needed nitroglycerin. Future consideration includes addition of beta blocker.   5. HTN -BP elevated today. Increase Imdur, as above.  6. Tobacco abuse - Not interested in quitting. Smoking cessation encouraged. Recommend utilization of [id number].  7. HLD, LDL goal less than 70 - Need for improved lipid control. She is hesitant regarding multiple medication changes and understandably prefers to prioritize her carotid duplex. Reports she is compliant with her atorvastatin 40 mg daily and fenofibrate. 09/2019 total cholesterol 210, LDL 132. Likely will need to transition from atorvastatin 40 to high intensity Crestor 40mg  daily at follow up.  Disposition: Follow up after carotid duplex and lexiscan myoview with Dr. Kirke CorinArida.   Tara Sorrowaitlin S Carsin Randazzo, NP 05/01/2020, 8:17 PM

## 2020-05-02 ENCOUNTER — Telehealth: Payer: Self-pay | Admitting: Cardiovascular Disease

## 2020-05-02 NOTE — Telephone Encounter (Signed)
  Patient Consent for Virtual Visit         Tara [name] has provided verbal consent on 05/02/2020 for a virtual visit (video or telephone).   CONSENT FOR VIRTUAL VISIT FOR:  Tara [name]  By participating in this virtual visit I agree to the following:  I hereby voluntarily request, consent and authorize [facility] and its employed or contracted physicians, physician assistants, nurse practitioners or other licensed health care professionals (the Practitioner), to provide me with telemedicine health care services (the "Services") as deemed necessary by the treating Practitioner. I acknowledge and consent to receive the Services by the Practitioner via telemedicine. I understand that the telemedicine visit will involve communicating with the Practitioner through live audiovisual communication technology and the disclosure of certain medical information by electronic transmission. I acknowledge that I have been given the opportunity to request an in-person assessment or other available alternative prior to the telemedicine visit and am voluntarily participating in the telemedicine visit.  I understand that I have the right to withhold or withdraw my consent to the use of telemedicine in the course of my care at any time, without affecting my right to future care or treatment, and that the Practitioner or I may terminate the telemedicine visit at any time. I understand that I have the right to inspect all information obtained and/or recorded in the course of the telemedicine visit and may receive copies of available information for a reasonable fee.  I understand that some of the potential risks of receiving the Services via telemedicine include:  Marland Kitchen Delay or interruption in medical evaluation due to technological equipment failure or disruption; . Information transmitted may not be sufficient (e.g. poor resolution of images) to allow for appropriate medical decision making by the  Practitioner; and/or  . In rare instances, security protocols could fail, causing a breach of personal health information.  Furthermore, I acknowledge that it is my responsibility to provide information about my medical history, conditions and care that is complete and accurate to the best of my ability. I acknowledge that Practitioner's advice, recommendations, and/or decision may be based on factors not within their control, such as incomplete or inaccurate data provided by me or distortions of diagnostic images or specimens that may result from electronic transmissions. I understand that the practice of medicine is not an exact science and that Practitioner makes no warranties or guarantees regarding treatment outcomes. I acknowledge that a copy of this consent can be made available to me via my patient portal [facility] MyChart), or I can request a printed copy by calling the office of [facility].    I understand that my insurance will be billed for this visit.   I have read or had this consent read to me. . I understand the contents of this consent, which adequately explains the benefits and risks of the Services being provided via telemedicine.  . I have been provided ample opportunity to ask questions regarding this consent and the Services and have had my questions answered to my satisfaction. . I give my informed consent for the services to be provided through the use of telemedicine in my medical care

## 2020-05-07 MED ORDER — ONDANSETRON HCL 4 MG PO TABS: 4.0000 mg | ORAL_TABLET | Freq: Four times a day (QID) | ORAL | 1 refills | Status: DC | PRN

## 2020-05-07 MED ORDER — PREGABALIN 75 MG PO CAPS: ORAL_CAPSULE | ORAL | 0 refills | Status: DC

## 2020-05-07 MED ORDER — LISINOPRIL 40 MG PO TABS: ORAL_TABLET | ORAL | 0 refills | Status: DC

## 2020-05-09 ENCOUNTER — Other Ambulatory Visit: Payer: Self-pay | Admitting: Family Medicine

## 2020-05-09 ENCOUNTER — Telehealth: Payer: Self-pay | Admitting: Family Medicine

## 2020-05-09 ENCOUNTER — Telehealth: Payer: Self-pay

## 2020-05-09 DIAGNOSIS — Z748 Other problems related to care provider dependency: Secondary | ICD-10-CM

## 2020-05-09 NOTE — Telephone Encounter (Signed)
Absolutely give verbal for social work. I'll place an order for CCM and C3 so we can get her in ASAP.

## 2020-05-09 NOTE — Telephone Encounter (Signed)
Home Health Verbal Orders - Caller/Agency: Johnny Bridge [place]//[facility]  Callback Number: [phone]  Secure line. Can leave message  Requesting OT/PT/Skilled Nursing/Social Work/Speech Therapy: Verbal For a Child psychotherapist to assist with [employer] and long term planning

## 2020-05-09 NOTE — Telephone Encounter (Signed)
Patient has not been seen since May. Needs follow up

## 2020-05-09 NOTE — Telephone Encounter (Signed)
Routing to provider to advise.  

## 2020-05-09 NOTE — Telephone Encounter (Signed)
Ok for verbal 

## 2020-05-09 NOTE — Progress Notes (Signed)
Ref ccm 

## 2020-05-09 NOTE — Telephone Encounter (Signed)
Copied from CRM [id number]. Topic: General - Other >> May 09, 2020  2:49 PM Jaquita Rector A wrote: Reason for CRM: Johnny Bridge with [facility] called to inform Dr Laural Benes that patient BP is 150/100 has taken her BP medication Lisinopril today has no symptoms no headache no weakness. Per Johnny Bridge she will limit the exercise today due to the BP being elevated.  Per patient she was on amLODipine (NORVASC) 10 MG tablet in the past. Asking DR Laural Benes if there are any instructions to please  call Johnny Bridge at Ph# [phone] or the patient  Ph# [phone]

## 2020-05-10 ENCOUNTER — Ambulatory Visit
Admission: RE | Admit: 2020-05-10 | Discharge: 2020-05-10 | Disposition: A | Discharge status: Home / Self Care | Payer: Federal, State, Local not specified - PPO | Source: Ambulatory Visit | Attending: Neurology | Admitting: Neurology

## 2020-05-10 ENCOUNTER — Other Ambulatory Visit: Payer: Self-pay

## 2020-05-10 DIAGNOSIS — G459 Transient cerebral ischemic attack, unspecified: Secondary | ICD-10-CM

## 2020-05-10 IMAGING — MR MR HEAD W/O CM
11 series · 42 of 48 positions shown · non-contrast
Comparison: None.

CLINICAL DATA: Abnormal speech, right leg weakness

EXAM:
MRI HEAD WITHOUT CONTRAST
TECHNIQUE: Multiplanar, multiecho pulse sequences of the brain and surrounding
structures were obtained without intravenous contrast.

[Series 5: ax dwi_tracew · axial · 3.0mm · 0.60mm/px · z∈[-114,+41]mm · 4 of 48 slices shown]
[im 1/48]
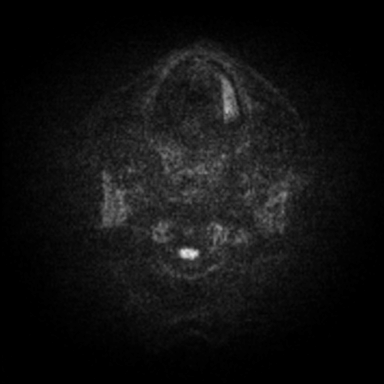
[im 16/48]
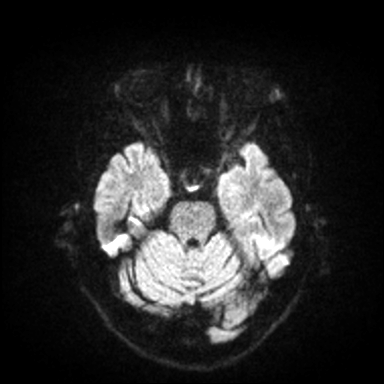
[im 32/48]
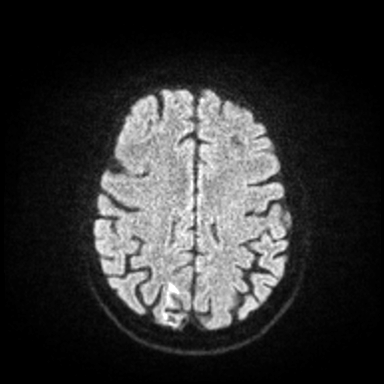
[im 48/48]
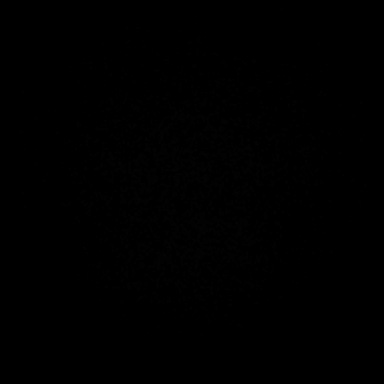

[Series 6: ax dwi_adc · axial · 3.0mm · 0.60mm/px · z∈[-114,+37]mm · 4 of 47 slices shown]
[im 1/47]
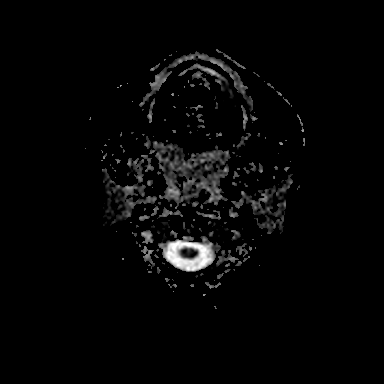
[im 16/47]
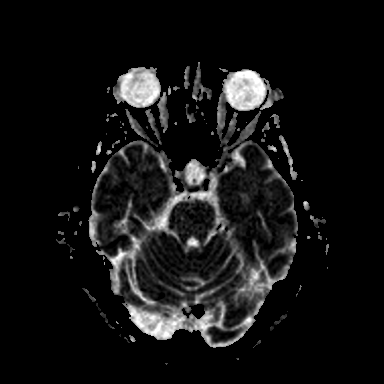
[im 31/47]
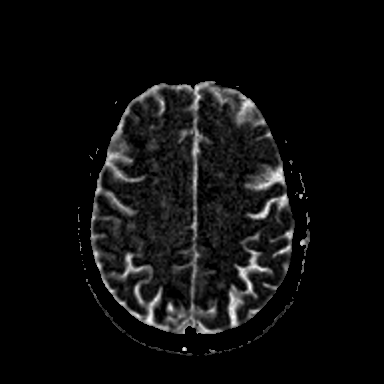
[im 47/47]
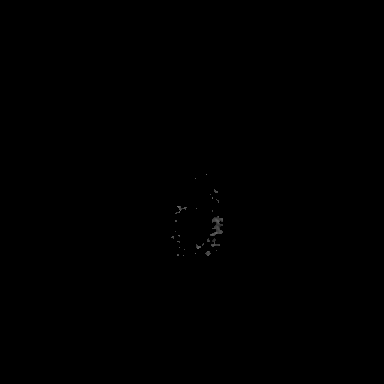

[Series 7: cor dwi_tracew · coronal · 5.0mm · 0.60mm/px · 3 of 36 slices shown]
[im 1/36]
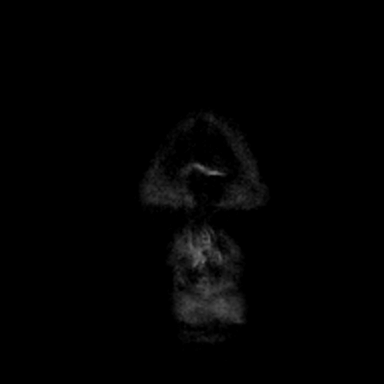
[im 18/36]
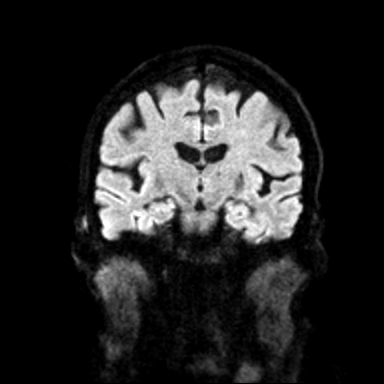
[im 36/36]
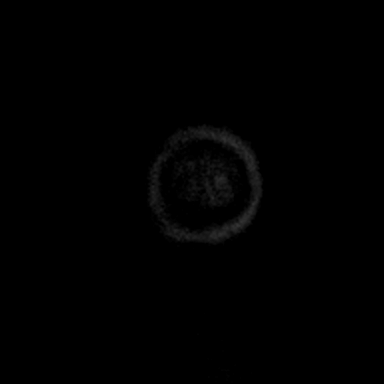

[Series 8: cor dwi_adc · coronal · 5.0mm · 0.60mm/px · 3 of 36 slices shown]
[im 1/36]
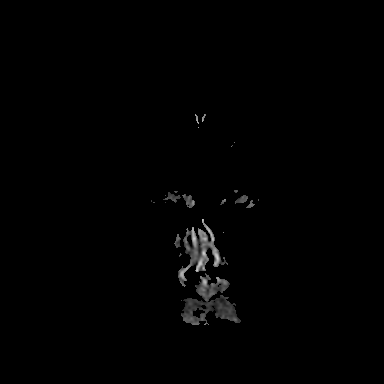
[im 18/36]
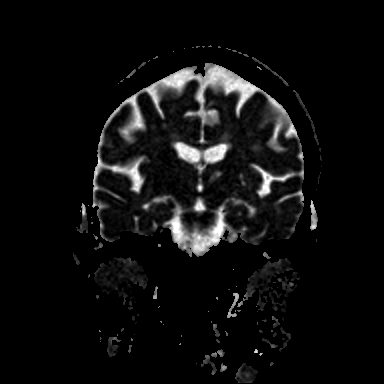
[im 36/36]
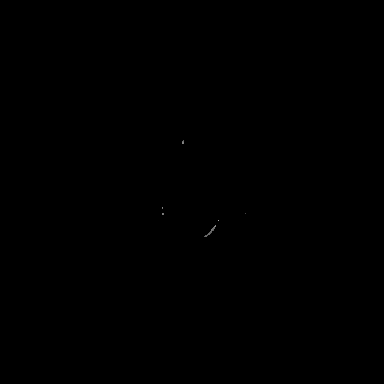

[Series 9: T1 · sagittal · 5.0mm · 0.62mm/px · 2 of 21 slices shown (1 of 2)]
[im 1/21]
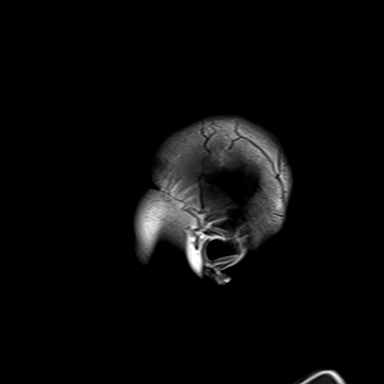
[im 21/21]
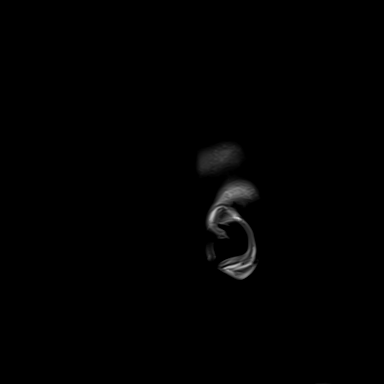

[Series 10: T2 · axial · 5.0mm · 0.53mm/px · z∈[-108,+36]mm · 2 of 25 slices shown (1 of 2)]
[im 1/25]
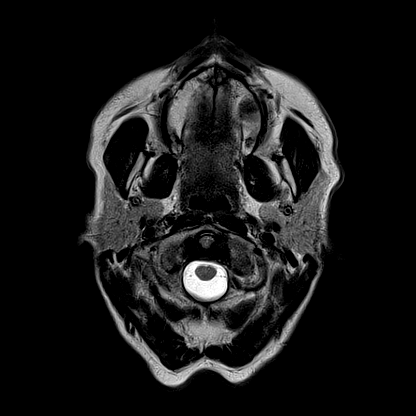
[im 25/25]
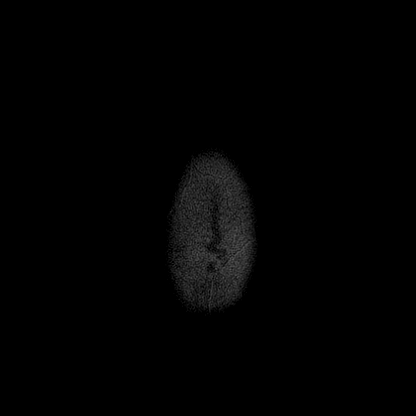

[Series 12: pha_images · axial · 3.0mm · 0.90mm/px · z∈[-125,+49]mm · 5 of 58 slices shown]
[im 1/58]
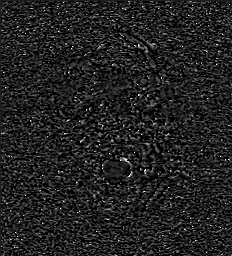
[im 15/58]
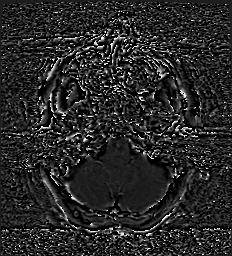
[im 29/58]
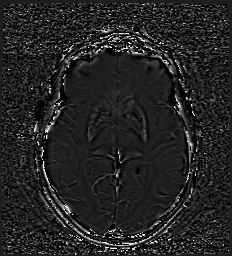
[im 43/58]
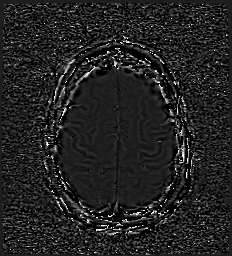
[im 58/58]
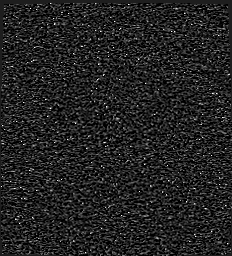

[Series 13: swi_images · axial · 3.0mm · 0.90mm/px · z∈[-125,+52]mm · 5 of 60 slices shown]
[im 1/60]
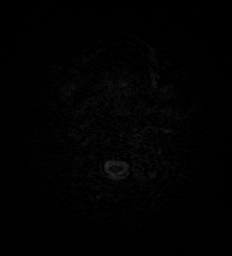
[im 15/60]
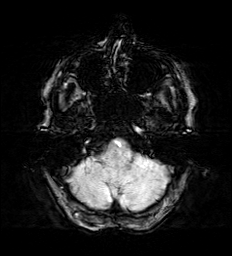
[im 30/60]
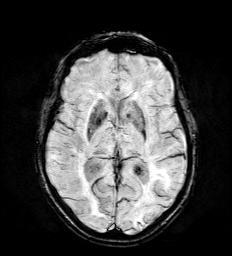
[im 45/60]
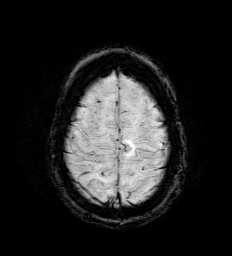
[im 60/60]
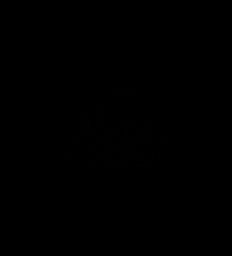

[Series 15: FLAIR · axial · 3.0mm · 0.53mm/px · z∈[-117,+45]mm · 4 of 55 slices shown]
[im 1/55]
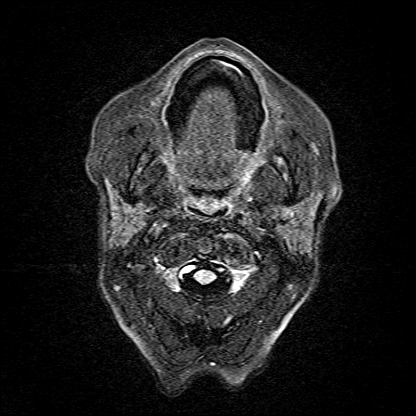
[im 19/55]
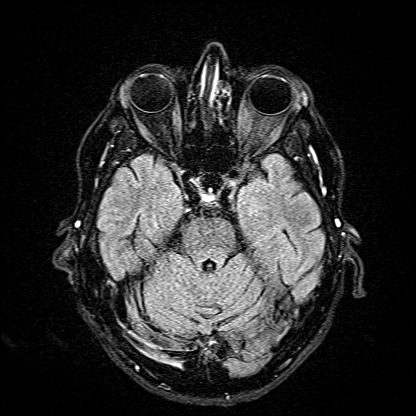
[im 37/55]
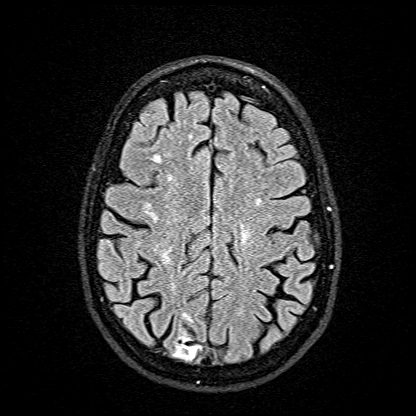
[im 55/55]
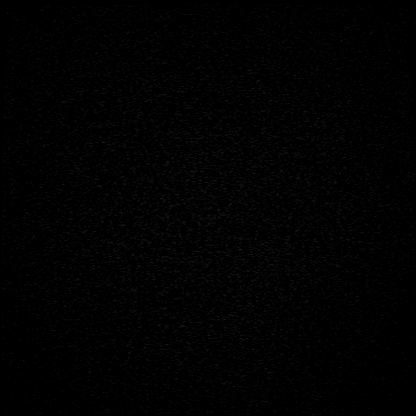

[Series 16: T1 · axial · 1.0mm · 0.98mm/px · z∈[-124,+50]mm · 8 of 176 slices shown (2 of 2)]
[im 1/176]
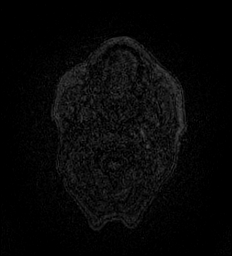
[im 27/176]
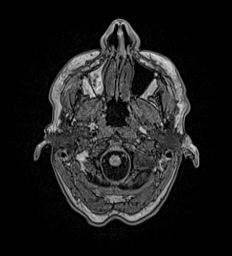
[im 54/176]
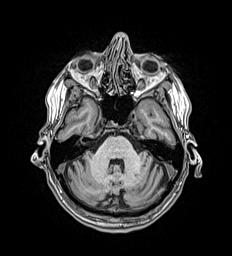
[im 81/176]
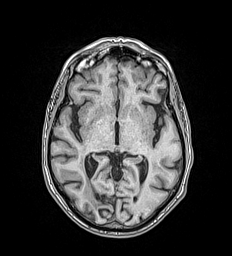
[im 95/176]
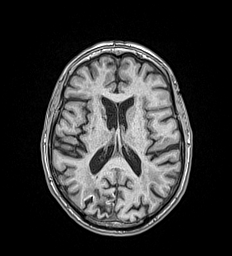
[im 122/176]
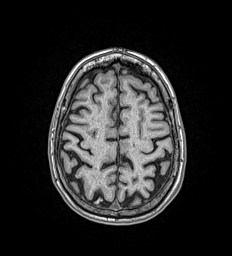
[im 149/176]
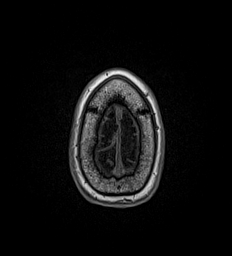
[im 176/176]
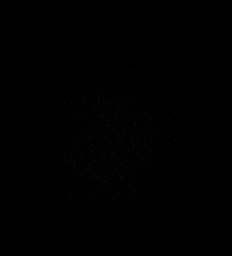

[Series 17: T2 · coronal · 5.0mm · 0.57mm/px · 2 of 29 slices shown (2 of 2)]
[im 1/29]
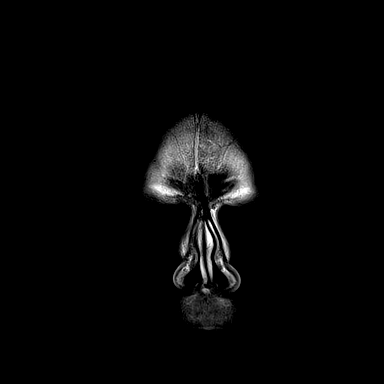
[im 29/29]
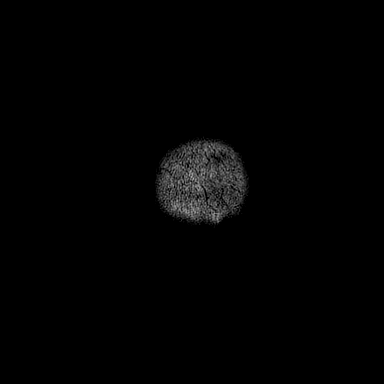

[42 of 48 positions shown; findings below may reference images not displayed]

FINDINGS: Brain: There are chronic infarcts of the right parietooccipital and
temporal lobes and left occipital, temporal, and frontal lobes.
There are adjacent areas of mildly reduced diffusion in the right
parietooccipital and left occipital lobes. Reflecting superimposed
more recent infarction.

No intracranial hemorrhage. Other areas of patchy T2 hyperintensity
in the supratentorial white matter are nonspecific but may reflect
mild to moderate chronic microvascular ischemic changes. There are
chronic small vessel infarcts of the cerebellar hemispheres, left
thalamus, and bilateral basal ganglia. Prominence of the ventricles
and sulci reflects mild generalized parenchymal volume loss.

There is no intracranial mass or significant mass effect

Vascular: Major vessel flow voids at the skull base are preserved.

Skull and upper cervical spine: Normal marrow signal is preserved.

Sinuses/Orbits: Paranasal sinuses are aerated. Orbits are
unremarkable.

Other: Sella is unremarkable.  Mastoid air cells are clear.
IMPRESSION: Small areas of acute to subacute infarction superimposed on chronic
right parieto-occipital and left occipital infarcts.

Additional chronic infarcts as described.

These results were called by telephone at the time of interpretation
on [DATE] at [DATE] to JHEMBOY, nurse for JHEMBOY , who
verbally acknowledged these results. Patient to go to the emergency
room for further evaluation.

## 2020-05-10 NOTE — Telephone Encounter (Signed)
[place] advised as below.

## 2020-05-10 NOTE — Telephone Encounter (Signed)
Martha advised as below. 

## 2020-05-15 ENCOUNTER — Encounter
Admission: RE | Admit: 2020-05-15 | Discharge: 2020-05-15 | Disposition: A | Discharge status: Home / Self Care | Payer: Federal, State, Local not specified - PPO | Source: Ambulatory Visit | Attending: Family | Admitting: Family

## 2020-05-15 ENCOUNTER — Telehealth: Payer: Self-pay | Admitting: Family Medicine

## 2020-05-15 ENCOUNTER — Ambulatory Visit (INDEPENDENT_AMBULATORY_CARE_PROVIDER_SITE_OTHER): Payer: Federal, State, Local not specified - PPO

## 2020-05-15 ENCOUNTER — Telehealth: Payer: Self-pay | Admitting: *Deleted

## 2020-05-15 ENCOUNTER — Other Ambulatory Visit: Payer: Self-pay

## 2020-05-15 DIAGNOSIS — I25118 Atherosclerotic heart disease of native coronary artery with other forms of angina pectoris: Secondary | ICD-10-CM | POA: Diagnosis not present

## 2020-05-15 DIAGNOSIS — G459 Transient cerebral ischemic attack, unspecified: Secondary | ICD-10-CM

## 2020-05-15 DIAGNOSIS — E782 Mixed hyperlipidemia: Secondary | ICD-10-CM

## 2020-05-15 LAB — NM MYOCAR MULTI W/SPECT W/WALL MOTION / EF
LV dias vol: 63 mL (ref 46–106)
LV sys vol: 30 mL
Peak BP: 78 mmHg
Peak HR: 78 {beats}/min
Percent HR: 52 %
Rest HR: 64 {beats}/min
SDS: 0
SRS: 5
SSS: 0
TID: 1.18

## 2020-05-15 MED ORDER — REGADENOSON 0.4 MG/5ML IV SOLN
0.4000 mg | Freq: Once | INTRAVENOUS | Status: AC
  Administered 2020-05-15: 0.4 mg via INTRAVENOUS

## 2020-05-15 MED ORDER — TECHNETIUM TC 99M TETROFOSMIN IV KIT
10.4500 | PACK | Freq: Once | INTRAVENOUS | Status: AC | PRN
  Administered 2020-05-15: 10.45 via INTRAVENOUS

## 2020-05-15 MED ORDER — TECHNETIUM TC 99M TETROFOSMIN IV KIT
31.9900 | PACK | Freq: Once | INTRAVENOUS | Status: AC | PRN
  Administered 2020-05-15: 31.99 via INTRAVENOUS

## 2020-05-15 MED ORDER — ROSUVASTATIN CALCIUM 40 MG PO TABS: 40.0000 mg | ORAL_TABLET | Freq: Every day | ORAL | 2 refills | Status: DC

## 2020-05-15 NOTE — Telephone Encounter (Signed)
Spoke to patient and she verbalized understanding of the results and plan of care. She is aware to stop atorvastatin and start rosuvastatin 40 mg daily. She would like to get her lab work at [facility] office in [place] because she has issues with transportation. Rx sent to pharmacy. Lab orders entered. Patient aware she is to call to schedule appointment for labs around December 20th, 2021.

## 2020-05-15 NOTE — Telephone Encounter (Signed)
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   Continue Plavix. Goal of LDL (bad cholesterol) <70. Recommend transition from Atorvastatin 40mg  daily to Rosuvastatin 40mg  daily with repeat CMP/liver panel in 8 weeks. Continue present fenofibrate dose.

## 2020-05-15 NOTE — Telephone Encounter (Signed)
   SF 05/15/2020   Name: Tara [name]   MRN: [id number]   DOB: January 18, 1946   AGE: 74 y.o.   GENDER: female   PCP Olevia Perches P, DO.   Spoke with patient regarding referral. Patient stated that she is in need of transportation services to and from appointments and to run errands. Patient stated that she may also even need an aide to help her with some things around the house and to run errands with her. Informed patient that Care Guide will research to see if there are any resources that may be able to assist her. [name] also asked if resources can be emailed to her. Will e-mail Ms. Cage once resources have been established.    [facility] Care Guide, Embedded Care Coordination [facility], Care Management Phone: [phone] Email: [email or web address]

## 2020-05-17 ENCOUNTER — Telehealth: Payer: Self-pay | Admitting: *Deleted

## 2020-05-17 ENCOUNTER — Telehealth: Payer: Self-pay

## 2020-05-17 DIAGNOSIS — G459 Transient cerebral ischemic attack, unspecified: Secondary | ICD-10-CM

## 2020-05-17 NOTE — Telephone Encounter (Signed)
Results called to pt. Pt verbalized understanding. Patient is questioning whether she needs to follow up appointment at this time since her tests were negative at this time. She had Telemedicine visit with neurology today and they want her to have echo and holter.  They put in urgent referral. I am not sure if we would order or if they will do. Patient does not see the reason to have a follow up yet and waste her "hard earned money" on a copay if she still needs additional testing.  Advised I will ask provider for advice and update her about plan of care.

## 2020-05-17 NOTE — Telephone Encounter (Signed)
-----   Message from Alver Sorrow, NP sent at 05/15/2020  3:10 PM EDT ----- Stress test low risk. No evidence of ischemia. Good result!

## 2020-05-17 NOTE — Chronic Care Management (AMB) (Signed)
  Chronic Care Management   Note  05/17/2020 Name: Tara [name] MRN: [id number] DOB: 1945-09-25  Tara [name] is a 74 y.o. year old female who is a primary care patient of Valerie Roys, DO. I reached out to [place] by phone today in response to a referral sent by Ms. Tara Borer Campise's PCP, Dr. Wynetta Emery      [name] was given information about Chronic Care Management services today including:  1. CCM service includes personalized support from designated clinical staff supervised by her physician, including individualized plan of care and coordination with other care providers 2. 24/7 contact phone numbers for assistance for urgent and routine care needs. 3. Service will only be billed when office clinical staff spend 20 minutes or more in a month to coordinate care. 4. Only one practitioner may furnish and bill the service in a calendar month. 5. The patient may stop CCM services at any time (effective at the end of the month) by phone call to the office staff. 6. The patient will be responsible for cost sharing (co-pay) of up to 20% of the service fee (after annual deductible is met).  Patient agreed to services and verbal consent obtained.   Follow up plan: Telephone appointment with care management team member scheduled for:06/09/2020  Noreene Larsson, [place], [place], [place] [address] Direct Dial: [phone] [name] Website: [email or web address]

## 2020-05-18 NOTE — Telephone Encounter (Signed)
Okay to cancel 05/22/20 appointment. Okay to order monitor and echocardiogram.  We deferred heart monitor at last visit as she had the MRI upcoming and could not wear during MRI. If neurology wants 30 day monitor we could do either 30 day preventice or 2 back-to-back ZIO-XT.   We likely could place the ZIO while she is here for echocardiogram, if possible.   Can schedule follow up for after ZIO/echo completed.   Alver Sorrow, NP

## 2020-05-18 NOTE — Telephone Encounter (Signed)
Orders for echo and ZIO's entered No answer. Left message to call back.

## 2020-05-18 NOTE — Telephone Encounter (Signed)
   SF 05/18/2020   Name: Tara [name]   MRN: [id number]   DOB: [birth year]   AGE: 74 y.o.   GENDER: female   PCP Olevia Perches P, DO.    Per patient's request Care Guide e-mailed resources to patient at [email or web address]. Called patient to verify that she received the e-mail. Ms. Einspahr did not answer; left message for patient to give Care Guide a call back.   From: [email or web address]> To:    [email or web address] Subject: SECURE:  Careers information officer and Home Aide  Hi Ms. Sterry,    Thank you for speaking with me regarding community resources for transportation and home aide. Below are some resources that might be able to assist you. If you have any additional questions or concerns please give me a call. My information is listed below.   Transportation: [employer] or [employer]                            Dial-A-Ride Program                           [phone] ext [phone] for [place]                           $5 each way or $10 round-trip for Medicare patients.                  This service can provide transportation to your doctors appointments and other needs      that you may have. You will need to call at least two weeks in advance to schedule your ride.   Home Aide:      Attached to this e-mail is a list of home health aide providers and personal care service providers in your area.                                    This information may need to be discussed with your doctor to determine if you need to obtain a referral for services.    Thanks,   [facility] Care Guide, Embedded Care Coordination [facility], Care Management Phone: [phone] Email: [email or web address]   Danelle Berry Care Guide, Embedded Care Coordination [facility], Care Management Phone: [phone] Email: [email or web address]

## 2020-05-19 ENCOUNTER — Telehealth: Payer: Self-pay | Admitting: Family Medicine

## 2020-05-19 NOTE — Telephone Encounter (Signed)
   SF 05/19/2020   Name: Tara [name]   MRN: [id number]   DOB: 16-Sep-1945   AGE: 74 y.o.   GENDER: female   PCP Olevia Perches P, DO.   Spoke with [name] today regarding a referral for transportation. Patient stated that she received the transportation resources via e-mail. Asked patient if she has any questions regarding the information. Patient stated she does not have any questions at this time, but she will give Care Guide a call if anything changes.  Patient stated no additional needs at this time.   Closing referral pending any other needs of patient.    [facility] Care Guide, Embedded Care Coordination [facility], Care Management Phone: [phone] Email: [email or web address]

## 2020-05-22 ENCOUNTER — Telehealth: Payer: Self-pay

## 2020-05-22 ENCOUNTER — Telehealth: Payer: Federal, State, Local not specified - PPO | Admitting: Family

## 2020-05-22 NOTE — Telephone Encounter (Signed)
Patient called back and echo and zio placement were scheduled.  Called patient to see if she would like to have the ZIO monitor mailed to her to go ahead and wear for the 2 weeks prior to the echo. She agreed. Registered patient on ZIO suite.

## 2020-05-22 NOTE — Telephone Encounter (Signed)
[facility] called in stating that pt changed pharmacies to [facility] from [facility] and is needing a refill on her amlodipine and her hydrodiuril. Please advise.

## 2020-05-23 NOTE — Telephone Encounter (Signed)
Please Advise.  KP

## 2020-05-30 MED ORDER — AMLODIPINE BESYLATE 10 MG PO TABS
10.0000 mg | ORAL_TABLET | Freq: Every day | ORAL | 0 refills | Status: DC
Start: 2020-05-30 — End: 2020-08-04

## 2020-05-30 MED ORDER — HYDROCHLOROTHIAZIDE 25 MG PO TABS: ORAL_TABLET | ORAL | 0 refills | Status: DC

## 2020-05-31 ENCOUNTER — Ambulatory Visit (INDEPENDENT_AMBULATORY_CARE_PROVIDER_SITE_OTHER): Payer: Federal, State, Local not specified - PPO

## 2020-05-31 DIAGNOSIS — G459 Transient cerebral ischemic attack, unspecified: Secondary | ICD-10-CM

## 2020-06-09 ENCOUNTER — Telehealth: Payer: Federal, State, Local not specified - PPO

## 2020-06-09 ENCOUNTER — Telehealth: Payer: Self-pay | Admitting: Licensed Clinical Social Worker

## 2020-06-09 NOTE — Telephone Encounter (Signed)
Chronic Care Management    Clinical Social Work General Follow Up Note  06/09/2020 Name: Uliana Brinker Disney MRN: [id number] DOB: 30-May-1946  Pierre Bali Geck is a 74 y.o. year old female who is a primary care patient of Dorcas Carrow, DO. The CCM team was consulted for assistance with [facility] .   Review of patient status, including review of consultants reports, relevant laboratory and other test results, and collaboration with appropriate care team members and the patient's provider was performed as part of comprehensive patient evaluation and provision of chronic care management services.    LCSW completed CCM outreach attempt today but was unable to reach patient successfully. A HIPPA compliant voice message was left encouraging patient to return call once available. LCSW will ask Scheduling Care Guide to reschedule CCM SW appointment with patient as well.  Outpatient Encounter Medications as of 06/09/2020  Medication Sig  . ALPRAZolam (XANAX) 0.5 MG tablet Take 1 tablet (0.5 mg total) by mouth daily as needed for anxiety.  Marland Kitchen amLODipine (NORVASC) 10 MG tablet Take 1 tablet (10 mg total) by mouth daily. Please call for an appointment for more refills  . amoxicillin-clavulanate (AUGMENTIN) 875-125 MG tablet Take 1 tablet by mouth daily as needed.  Marland Kitchen aspirin EC 81 MG tablet Take 1 tablet (81 mg total) by mouth daily.  . B-D TB SYRINGE 1CC/27GX1/2" 27G X 1/2" 1 ML MISC USE AS DIRECTED EVERY 30 DAYS  . citalopram (CELEXA) 10 MG tablet TAKE 1 TABLET(10 MG) BY MOUTH TWICE DAILY  . clopidogrel (PLAVIX) 75 MG tablet Take 1 tablet (75 mg total) by mouth daily.  . cyanocobalamin (,VITAMIN B-12,) 1000 MCG/ML injection ADMINISTER 1 ML(1000 MCG) IN THE MUSCLE EVERY 30 DAYS  . diclofenac Sodium (VOLTAREN) 1 % GEL APPLY 4 GRAMS EXTERNALLY TO THE AFFECTED AREA FOUR TIMES DAILY  . donepezil (ARICEPT) 5 MG tablet Take 5 mg by mouth daily.  . famotidine (PEPCID) 20 MG tablet TAKE 1  TABLET(20 MG) BY MOUTH AT BEDTIME  . fenofibrate micronized (LOFIBRA) 200 MG capsule TAKE 1 CAPSULE(200 MG) BY MOUTH DAILY  . gabapentin (NEURONTIN) 100 MG capsule 1 cap in AM, 1 cap in PM, 2 caps QHS  . hydrochlorothiazide (HYDRODIURIL) 25 MG tablet TAKE 1 TABLET(25 MG) BY MOUTH DAILY Please call for an appointment for more refills  . HYDROcodone-acetaminophen (NORCO/VICODIN) 5-325 MG tablet Take 1 tablet by mouth every 6 (six) hours as needed for moderate pain or severe pain.  . Insulin Syringe 27G X 1/2" 1 ML MISC 1 each by Does not apply route every 30 (thirty) days.  . isosorbide mononitrate (IMDUR) 60 MG 24 hr tablet Take 1 tablet (60 mg total) by mouth in the morning and at bedtime.  . lidocaine (LIDODERM) 5 % 2 patches daily.  Marland Kitchen lisinopril (ZESTRIL) 40 MG tablet TAKE 1 TABLET(40 MG) BY MOUTH TWICE DAILY  . loperamide (IMODIUM) 2 MG capsule Take 2 mg by mouth as needed for diarrhea or loose stools.  . nitroGLYCERIN (NITROSTAT) 0.4 MG SL tablet Place 1 tablet (0.4 mg total) under the tongue every 5 (five) minutes as needed for chest pain.  . nortriptyline (PAMELOR) 50 MG capsule TAKE 1 CAPSULE(50 MG) BY MOUTH AT BEDTIME  . omeprazole (PRILOSEC) 40 MG capsule TAKE 1 CAPSULE BY MOUTH TWICE DAILY  . ondansetron (ZOFRAN) 4 MG tablet Take 1 tablet (4 mg total) by mouth every 6 (six) hours as needed for nausea or vomiting.  Marland Kitchen oxyCODONE-acetaminophen (PERCOCET/ROXICET) 5-325 MG tablet Take 1  tablet by mouth every 6 (six) hours as needed.  . predniSONE (DELTASONE) 20 MG tablet Take 20 mg by mouth as needed.  . pregabalin (LYRICA) 75 MG capsule 1 cap qHS for 1 week, then increase to 1 cap BID for 1 week, then 1 cap qAM and 2 caps qPM for 1 week, then 2 caps BID until you see me  . rosuvastatin (CRESTOR) 40 MG tablet Take 1 tablet (40 mg total) by mouth daily.  . traZODone (DESYREL) 50 MG tablet Take 50 mg by mouth at bedtime.  . triamcinolone ointment (KENALOG) 0.5 % Apply 1 application topically 2  (two) times daily.   Facility-Administered Encounter Medications as of 06/09/2020  Medication  . cyanocobalamin ((VITAMIN B-12)) injection 1,000 mcg    Follow Up Plan: Scheduling Care Guide will reach out to patient to reschedule appointment.   Dickie La, BSW, MSW, LCSW [facility] Care Management [place]  [facility] [email or web address] Phone: [phone]

## 2020-06-20 ENCOUNTER — Encounter (INDEPENDENT_AMBULATORY_CARE_PROVIDER_SITE_OTHER): Payer: Medicare Other

## 2020-06-20 ENCOUNTER — Ambulatory Visit (INDEPENDENT_AMBULATORY_CARE_PROVIDER_SITE_OTHER): Payer: Medicare Other | Admitting: Vascular Surgery

## 2020-06-20 ENCOUNTER — Telehealth: Payer: Self-pay | Admitting: Cardiovascular Disease

## 2020-06-20 NOTE — Telephone Encounter (Signed)
Added to wait list.

## 2020-06-20 NOTE — Telephone Encounter (Signed)
Patient rescheduled 12/2 appt .  Daughter states she already wore a holter monitor and wants to know how to return and if the schedule appt to apply after echo is needed.    Please call.

## 2020-06-20 NOTE — Telephone Encounter (Signed)
Spoke with the patients daughter Lawson Fiscal. Lawson Fiscal sts that she was not made aware of the pt 06/22/20 echo appt by the pt and had to reschedule. She is concerned that the patients echo has been scheduled for 3 weeks out and would like to see if it can be done sooner if possible. Adv the patients daughter that I will fwd the msg to scheduling to see if they could possibly get the patient echo rescheduled sooner at the hospital. Betsey Holiday that the pt was to wear (2) 14 day zio monitors to total 28 days. The patient has completed the first monitor. Adv Loris that it should be returned along with the pt diary using the prepaid usps box that came with the monitor. Betsey Holiday that I will cancel the appt to have the monitor placed and order it to be mailed to the pt home. It is to be worn for 14 days as well and returned using the same method.

## 2020-06-22 ENCOUNTER — Other Ambulatory Visit: Payer: Federal, State, Local not specified - PPO

## 2020-06-23 ENCOUNTER — Encounter (INDEPENDENT_AMBULATORY_CARE_PROVIDER_SITE_OTHER): Payer: Medicare Other

## 2020-06-23 ENCOUNTER — Ambulatory Visit (INDEPENDENT_AMBULATORY_CARE_PROVIDER_SITE_OTHER): Payer: Federal, State, Local not specified - PPO

## 2020-06-23 ENCOUNTER — Ambulatory Visit (INDEPENDENT_AMBULATORY_CARE_PROVIDER_SITE_OTHER): Payer: Medicare Other | Admitting: Vascular Surgery

## 2020-06-23 DIAGNOSIS — G459 Transient cerebral ischemic attack, unspecified: Secondary | ICD-10-CM | POA: Diagnosis not present

## 2020-06-26 ENCOUNTER — Telehealth: Payer: Self-pay

## 2020-06-26 NOTE — Telephone Encounter (Signed)
Pt has been r/s  

## 2020-06-26 NOTE — Chronic Care Management (AMB) (Signed)
  Care Management   Note  06/26/2020 Name: [name] MRN: [id number] DOB: 1946-01-20  Tara [name] is a 74 y.o. year old female who is a primary care patient of Dorcas Carrow, DO and is actively engaged with the care management team. I reached out to Tara [name] by phone today to assist with re-scheduling an initial visit with the Licensed Clinical Social Worker  Follow up plan: Telephone appointment with care management team member scheduled for:07/07/2020  Penne Lash, RMA Care Guide, Embedded Care Coordination [facility]  [place], [place] [address] Direct Dial: [phone] [email or web address]: [email or web address]

## 2020-06-26 NOTE — Chronic Care Management (AMB) (Signed)
  Care Management   Note  06/26/2020 Name: Tara [name] MRN: [id number] DOB: 05-Feb-1946  Tara [name] is a 74 y.o. year old female who is a primary care patient of Dorcas Carrow, DO and is actively engaged with the care management team. I reached out to Tara [name] by phone today to assist with re-scheduling an initial visit with the Licensed Clinical Social Worker  Follow up plan: Unsuccessful telephone outreach attempt made. A HIPAA compliant phone message was left for the patient providing contact information and requesting a return call.   Tara [name], RMA Care Guide, Embedded Care Coordination [facility]  [place], [place] [address] Direct Dial: [phone] [name] Website: [email or web address]

## 2020-07-04 ENCOUNTER — Other Ambulatory Visit: Payer: Federal, State, Local not specified - PPO

## 2020-07-07 ENCOUNTER — Telehealth: Payer: Self-pay | Admitting: Licensed Clinical Social Worker

## 2020-07-07 ENCOUNTER — Telehealth: Payer: Federal, State, Local not specified - PPO

## 2020-07-07 NOTE — Telephone Encounter (Addendum)
Chronic Care Management    Clinical Social Work General Follow Up Note  07/07/2020 Name: Tara [name] MRN: [id number] DOB: Aug 18, 1945  Tara [name] is a 74 y.o. year old female who is a primary care patient of Dorcas Carrow, DO. The CCM team was consulted for assistance with [facility] .   Review of patient status, including review of consultants reports, relevant laboratory and other test results, and collaboration with appropriate care team members and the patient's provider was performed as part of comprehensive patient evaluation and provision of chronic care management services.    LCSW completed CCM second outreach attempt today but was unable to reach patient successfully. A HIPPA compliant voice message was left encouraging patient to return call once available. LCSW will ask Scheduling Care Guide to reschedule CCM SW appointment with patient as well.   Outpatient Encounter Medications as of 07/07/2020  Medication Sig  . ALPRAZolam (XANAX) 0.5 MG tablet Take 1 tablet (0.5 mg total) by mouth daily as needed for anxiety.  Marland Kitchen amLODipine (NORVASC) 10 MG tablet Take 1 tablet (10 mg total) by mouth daily. Please call for an appointment for more refills  . amoxicillin-clavulanate (AUGMENTIN) 875-125 MG tablet Take 1 tablet by mouth daily as needed.  Marland Kitchen aspirin EC 81 MG tablet Take 1 tablet (81 mg total) by mouth daily.  . B-D TB SYRINGE 1CC/27GX1/2" 27G X 1/2" 1 ML MISC USE AS DIRECTED EVERY 30 DAYS  . citalopram (CELEXA) 10 MG tablet TAKE 1 TABLET(10 MG) BY MOUTH TWICE DAILY  . clopidogrel (PLAVIX) 75 MG tablet Take 1 tablet (75 mg total) by mouth daily.  . cyanocobalamin (,VITAMIN B-12,) 1000 MCG/ML injection ADMINISTER 1 ML(1000 MCG) IN THE MUSCLE EVERY 30 DAYS  . diclofenac Sodium (VOLTAREN) 1 % GEL APPLY 4 GRAMS EXTERNALLY TO THE AFFECTED AREA FOUR TIMES DAILY  . donepezil (ARICEPT) 5 MG tablet Take 5 mg by mouth daily.  . famotidine (PEPCID) 20 MG tablet TAKE  1 TABLET(20 MG) BY MOUTH AT BEDTIME  . fenofibrate micronized (LOFIBRA) 200 MG capsule TAKE 1 CAPSULE(200 MG) BY MOUTH DAILY  . gabapentin (NEURONTIN) 100 MG capsule 1 cap in AM, 1 cap in PM, 2 caps QHS  . hydrochlorothiazide (HYDRODIURIL) 25 MG tablet TAKE 1 TABLET(25 MG) BY MOUTH DAILY Please call for an appointment for more refills  . HYDROcodone-acetaminophen (NORCO/VICODIN) 5-325 MG tablet Take 1 tablet by mouth every 6 (six) hours as needed for moderate pain or severe pain.  . Insulin Syringe 27G X 1/2" 1 ML MISC 1 each by Does not apply route every 30 (thirty) days.  . isosorbide mononitrate (IMDUR) 60 MG 24 hr tablet Take 1 tablet (60 mg total) by mouth in the morning and at bedtime.  . lidocaine (LIDODERM) 5 % 2 patches daily.  Marland Kitchen lisinopril (ZESTRIL) 40 MG tablet TAKE 1 TABLET(40 MG) BY MOUTH TWICE DAILY  . loperamide (IMODIUM) 2 MG capsule Take 2 mg by mouth as needed for diarrhea or loose stools.  . nitroGLYCERIN (NITROSTAT) 0.4 MG SL tablet Place 1 tablet (0.4 mg total) under the tongue every 5 (five) minutes as needed for chest pain.  . nortriptyline (PAMELOR) 50 MG capsule TAKE 1 CAPSULE(50 MG) BY MOUTH AT BEDTIME  . omeprazole (PRILOSEC) 40 MG capsule TAKE 1 CAPSULE BY MOUTH TWICE DAILY  . ondansetron (ZOFRAN) 4 MG tablet Take 1 tablet (4 mg total) by mouth every 6 (six) hours as needed for nausea or vomiting.  Marland Kitchen oxyCODONE-acetaminophen (PERCOCET/ROXICET) 5-325 MG tablet  Take 1 tablet by mouth every 6 (six) hours as needed.  . predniSONE (DELTASONE) 20 MG tablet Take 20 mg by mouth as needed.  . pregabalin (LYRICA) 75 MG capsule 1 cap qHS for 1 week, then increase to 1 cap BID for 1 week, then 1 cap qAM and 2 caps qPM for 1 week, then 2 caps BID until you see me  . rosuvastatin (CRESTOR) 40 MG tablet Take 1 tablet (40 mg total) by mouth daily.  . traZODone (DESYREL) 50 MG tablet Take 50 mg by mouth at bedtime.  . triamcinolone ointment (KENALOG) 0.5 % Apply 1 application topically 2  (two) times daily.   Facility-Administered Encounter Medications as of 07/07/2020  Medication  . cyanocobalamin ((VITAMIN B-12)) injection 1,000 mcg   Follow Up Plan: Scheduling Care Guide will reach out to patient to reschedule appointment.   Dickie La, BSW, MSW, LCSW [facility] Care Management [place]  [facility] [email or web address] Phone: [phone]

## 2020-07-11 ENCOUNTER — Ambulatory Visit (INDEPENDENT_AMBULATORY_CARE_PROVIDER_SITE_OTHER): Payer: Federal, State, Local not specified - PPO

## 2020-07-11 ENCOUNTER — Other Ambulatory Visit: Payer: Self-pay

## 2020-07-11 ENCOUNTER — Other Ambulatory Visit: Payer: Federal, State, Local not specified - PPO

## 2020-07-11 DIAGNOSIS — G459 Transient cerebral ischemic attack, unspecified: Secondary | ICD-10-CM

## 2020-07-11 MED ORDER — PERFLUTREN LIPID MICROSPHERE
1.0000 mL | INTRAVENOUS | Status: AC | PRN
  Administered 2020-07-11: 2 mL via INTRAVENOUS

## 2020-07-12 ENCOUNTER — Telehealth: Payer: Self-pay

## 2020-07-12 NOTE — Chronic Care Management (AMB) (Signed)
  Care Management   Note  07/12/2020 Name: Tara [name] MRN: [id number] DOB: [birth year]  Tara [name] is a 74 y.o. year old female who is a primary care patient of Dorcas Carrow, DO and is actively engaged with the care management team. I reached out to Tara Bali Klus by phone today to assist with re-scheduling an initial visit with the Licensed Clinical Social Worker  Follow up plan: Unsuccessful telephone outreach attempt made. A HIPAA compliant phone message was left for the patient providing contact information and requesting a return call.  The care management team will reach out to the patient again over the next 7 days.  If patient returns call to provider office, please advise to call Embedded Care Management Care Guide Penne Lash  at [phone]  Penne Lash, RMA Care Guide, Embedded Care Coordination [facility]  [place], [place] [address] Direct Dial: [phone] [email or web address]: [email or web address]

## 2020-07-13 ENCOUNTER — Telehealth: Payer: Self-pay

## 2020-07-13 ENCOUNTER — Telehealth: Payer: Self-pay | Admitting: *Deleted

## 2020-07-13 LAB — ECHOCARDIOGRAM COMPLETE
AR max vel: 1.54 cm2
AV Area VTI: 1.49 cm2
AV Area mean vel: 1.58 cm2
AV Mean grad: 15.8 mmHg
AV Peak grad: 33.6 mmHg
Ao pk vel: 2.9 m/s
Area-P 1/2: 1.65 cm2
S' Lateral: 2.3 cm

## 2020-07-13 NOTE — Telephone Encounter (Signed)
-----   Message from Alver Sorrow, NP sent at 07/13/2020  4:06 PM EST ----- Echocardiogram shows normal heart pumping function. Moderate thickening of heart muscle and mild stiffness likely due to history of hypertension. Recommend optimal blood pressure control. 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 Recommend repeat echocardiogram for monitoring in 1 year.   Recommend follow up with Dr. Kirke Corin or APP in approximately 1 month for reassessment of BP and to review cardiac testing.   I have forwarded echocardiogram to her neurologist for his review.

## 2020-07-13 NOTE — Telephone Encounter (Signed)
Tried to reach out to pt, left a detail message about her echo results as okay by her DPR on file, advised to call back for a one month f/u appt with Dr. Kirke Corin that needs to be schedule and if she has any concerns or questions about her results.

## 2020-07-13 NOTE — Telephone Encounter (Signed)
Attempted to call pt to give echo results. No answer. LMOM TCB.

## 2020-07-13 NOTE — Telephone Encounter (Signed)
-----   Message from Caitlin S Walker, NP sent at 07/13/2020  4:06 PM EST ----- Echocardiogram shows normal heart pumping function. Moderate thickening of heart muscle and mild stiffness likely due to history of hypertension. Recommend optimal blood pressure control. 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 Recommend repeat echocardiogram for monitoring in 1 year.   Recommend follow up with Dr. Arida or APP in approximately 1 month for reassessment of BP and to review cardiac testing.   I have forwarded echocardiogram to her neurologist for his review. 

## 2020-07-18 ENCOUNTER — Telehealth: Payer: Self-pay | Admitting: *Deleted

## 2020-07-18 DIAGNOSIS — I359 Nonrheumatic aortic valve disorder, unspecified: Secondary | ICD-10-CM

## 2020-07-18 NOTE — Telephone Encounter (Signed)
-----   Message from Caitlin S Walker, NP sent at 07/13/2020  4:06 PM EST ----- Echocardiogram shows normal heart pumping function. Moderate thickening of heart muscle and mild stiffness likely due to history of hypertension. Recommend optimal blood pressure control. 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 Recommend repeat echocardiogram for monitoring in 1 year.   Recommend follow up with Dr. Arida or APP in approximately 1 month for reassessment of BP and to review cardiac testing.   I have forwarded echocardiogram to her neurologist for his review. 

## 2020-07-18 NOTE — Telephone Encounter (Signed)
-----   Message from Alver Sorrow, NP sent at 07/18/2020  1:51 PM EST ----- Heart monitor shower mostly normal sinus rhythm which is a good result. She had 1 episode of fast heart beat in the bottom chamber of her heart. She had rare early beats (called PVC/PAC)  which are common and not dangerous. There was no evidence of atrial fibrillation or significant pause. Overall reassuring result.   Of note, she is due for follow up with Dr. Kirke Corin or APP. Please schedule at her convenience to review recent cardiac testing.

## 2020-07-18 NOTE — Telephone Encounter (Signed)
Reviewed results with daughter and she states that patient mailed in second monitor. Order entered for echo in 1 year and also requested that they monitor blood pressures and keep a log so at appointment they can review. She verbalized understanding of our conversation, agreement with plan, and had no further questions at this time. Scheduled for virtual appointment and daughter was appreciative.     Patient Consent for Virtual Visit         Tara [name] has provided verbal consent on 07/18/2020 for a virtual visit (video or telephone).   CONSENT FOR VIRTUAL VISIT FOR:  Tara [name]  By participating in this virtual visit I agree to the following:  I hereby voluntarily request, consent and authorize [facility] and its employed or contracted physicians, physician assistants, nurse practitioners or other licensed health care professionals (the Practitioner), to provide me with telemedicine health care services (the "Services") as deemed necessary by the treating Practitioner. I acknowledge and consent to receive the Services by the Practitioner via telemedicine. I understand that the telemedicine visit will involve communicating with the Practitioner through live audiovisual communication technology and the disclosure of certain medical information by electronic transmission. I acknowledge that I have been given the opportunity to request an in-person assessment or other available alternative prior to the telemedicine visit and am voluntarily participating in the telemedicine visit.  I understand that I have the right to withhold or withdraw my consent to the use of telemedicine in the course of my care at any time, without affecting my right to future care or treatment, and that the Practitioner or I may terminate the telemedicine visit at any time. I understand that I have the right to inspect all information obtained and/or recorded in the course of the telemedicine visit and may  receive copies of available information for a reasonable fee.  I understand that some of the potential risks of receiving the Services via telemedicine include:  Marland Kitchen Delay or interruption in medical evaluation due to technological equipment failure or disruption; . Information transmitted may not be sufficient (e.g. poor resolution of images) to allow for appropriate medical decision making by the Practitioner; and/or  . In rare instances, security protocols could fail, causing a breach of personal health information.  Furthermore, I acknowledge that it is my responsibility to provide information about my medical history, conditions and care that is complete and accurate to the best of my ability. I acknowledge that Practitioner's advice, recommendations, and/or decision may be based on factors not within their control, such as incomplete or inaccurate data provided by me or distortions of diagnostic images or specimens that may result from electronic transmissions. I understand that the practice of medicine is not an exact science and that Practitioner makes no warranties or guarantees regarding treatment outcomes. I acknowledge that a copy of this consent can be made available to me via my patient portal [facility] MyChart), or I can request a printed copy by calling the office of [facility].    I understand that my insurance will be billed for this visit.   I have read or had this consent read to me. . I understand the contents of this consent, which adequately explains the benefits and risks of the Services being provided via telemedicine.  . I have been provided ample opportunity to ask questions regarding this consent and the Services and have had my questions answered to my satisfaction. . I give my informed consent for the services to  be provided through the use of telemedicine in my medical care

## 2020-07-18 NOTE — Telephone Encounter (Addendum)
Spoke with patients daughter per release form due to patient and spouse not able to hear on phone. She is POA and requested that we change patients contact number. 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 She requested virtual visit for this reason and scheduled for one month appointment. Reviewed results of echocardiogram and monitor with her. See above encounter.

## 2020-07-18 NOTE — Telephone Encounter (Signed)
Sounds great. Thank you!  Kingdavid Leinbach S Lezlie Ritchey, NP  

## 2020-07-19 NOTE — Telephone Encounter (Signed)
Pt has been rescheduled. 

## 2020-07-19 NOTE — Telephone Encounter (Signed)
Please see telephone encounter from 07/18/20 for further detail.

## 2020-07-19 NOTE — Chronic Care Management (AMB) (Signed)
  Care Management   Note  07/19/2020 Name: Tara [name] MRN: [id number] DOB: 04-29-1946  Tara [name] is a 74 y.o. year old female who is a primary care patient of Dorcas Carrow, DO and is actively engaged with the care management team. I reached out to Tara [name] by phone today to assist with re-scheduling an initial visit with the Licensed Clinical Social Worker  Follow up plan: Telephone appointment with care management team member scheduled for:07/26/2020  Penne Lash, RMA Care Guide, Embedded Care Coordination [facility]  [place], [place] [address] Direct Dial: [phone] [email or web address]: [email or web address]

## 2020-07-25 DIAGNOSIS — R413 Other amnesia: Secondary | ICD-10-CM | POA: Insufficient documentation

## 2020-07-25 DIAGNOSIS — H539 Unspecified visual disturbance: Secondary | ICD-10-CM | POA: Insufficient documentation

## 2020-07-25 DIAGNOSIS — R251 Tremor, unspecified: Secondary | ICD-10-CM | POA: Insufficient documentation

## 2020-07-25 DIAGNOSIS — Z8673 Personal history of transient ischemic attack (TIA), and cerebral infarction without residual deficits: Secondary | ICD-10-CM | POA: Insufficient documentation

## 2020-07-26 ENCOUNTER — Ambulatory Visit: Payer: Federal, State, Local not specified - PPO | Admitting: Licensed Clinical Social Worker

## 2020-07-26 NOTE — Chronic Care Management (AMB) (Signed)
Chronic Care Management    Clinical Social Work Follow Up Note  07/26/2020 Name: Tara [name] MRN: [id number] DOB: 1946/06/22  Tara [name] is a 75 y.o. year old female who is a primary care patient of Dorcas Carrow, DO. The CCM team was consulted for assistance with [facility] .   Review of patient status, including review of consultants reports, other relevant assessments, and collaboration with appropriate care team members and the patient's provider was performed as part of comprehensive patient evaluation and provision of chronic care management services.    Outpatient Encounter Medications as of 07/26/2020  Medication Sig  . ALPRAZolam (XANAX) 0.5 MG tablet Take 1 tablet (0.5 mg total) by mouth daily as needed for anxiety.  Marland Kitchen amLODipine (NORVASC) 10 MG tablet Take 1 tablet (10 mg total) by mouth daily. Please call for an appointment for more refills  . amoxicillin-clavulanate (AUGMENTIN) 875-125 MG tablet Take 1 tablet by mouth daily as needed.  Marland Kitchen aspirin EC 81 MG tablet Take 1 tablet (81 mg total) by mouth daily.  . B-D TB SYRINGE 1CC/27GX1/2" 27G X 1/2" 1 ML MISC USE AS DIRECTED EVERY 30 DAYS  . citalopram (CELEXA) 10 MG tablet TAKE 1 TABLET(10 MG) BY MOUTH TWICE DAILY  . clopidogrel (PLAVIX) 75 MG tablet Take 1 tablet (75 mg total) by mouth daily.  . cyanocobalamin (,VITAMIN B-12,) 1000 MCG/ML injection ADMINISTER 1 ML(1000 MCG) IN THE MUSCLE EVERY 30 DAYS  . diclofenac Sodium (VOLTAREN) 1 % GEL APPLY 4 GRAMS EXTERNALLY TO THE AFFECTED AREA FOUR TIMES DAILY  . donepezil (ARICEPT) 5 MG tablet Take 5 mg by mouth daily.  . famotidine (PEPCID) 20 MG tablet TAKE 1 TABLET(20 MG) BY MOUTH AT BEDTIME  . fenofibrate micronized (LOFIBRA) 200 MG capsule TAKE 1 CAPSULE(200 MG) BY MOUTH DAILY  . gabapentin (NEURONTIN) 100 MG capsule 1 cap in AM, 1 cap in PM, 2 caps QHS  . hydrochlorothiazide (HYDRODIURIL) 25 MG tablet TAKE 1 TABLET(25 MG) BY MOUTH DAILY Please call for an  appointment for more refills  . HYDROcodone-acetaminophen (NORCO/VICODIN) 5-325 MG tablet Take 1 tablet by mouth every 6 (six) hours as needed for moderate pain or severe pain.  . Insulin Syringe 27G X 1/2" 1 ML MISC 1 each by Does not apply route every 30 (thirty) days.  . isosorbide mononitrate (IMDUR) 60 MG 24 hr tablet Take 1 tablet (60 mg total) by mouth in the morning and at bedtime.  . lidocaine (LIDODERM) 5 % 2 patches daily.  Marland Kitchen lisinopril (ZESTRIL) 40 MG tablet TAKE 1 TABLET(40 MG) BY MOUTH TWICE DAILY  . loperamide (IMODIUM) 2 MG capsule Take 2 mg by mouth as needed for diarrhea or loose stools.  . nitroGLYCERIN (NITROSTAT) 0.4 MG SL tablet Place 1 tablet (0.4 mg total) under the tongue every 5 (five) minutes as needed for chest pain.  . nortriptyline (PAMELOR) 50 MG capsule TAKE 1 CAPSULE(50 MG) BY MOUTH AT BEDTIME  . omeprazole (PRILOSEC) 40 MG capsule TAKE 1 CAPSULE BY MOUTH TWICE DAILY  . ondansetron (ZOFRAN) 4 MG tablet Take 1 tablet (4 mg total) by mouth every 6 (six) hours as needed for nausea or vomiting.  Marland Kitchen oxyCODONE-acetaminophen (PERCOCET/ROXICET) 5-325 MG tablet Take 1 tablet by mouth every 6 (six) hours as needed.  . predniSONE (DELTASONE) 20 MG tablet Take 20 mg by mouth as needed.  . pregabalin (LYRICA) 75 MG capsule 1 cap qHS for 1 week, then increase to 1 cap BID for 1 week, then 1  cap qAM and 2 caps qPM for 1 week, then 2 caps BID until you see me  . rosuvastatin (CRESTOR) 40 MG tablet Take 1 tablet (40 mg total) by mouth daily.  . traZODone (DESYREL) 50 MG tablet Take 50 mg by mouth at bedtime.  . triamcinolone ointment (KENALOG) 0.5 % Apply 1 application topically 2 (two) times daily.   Facility-Administered Encounter Medications as of 07/26/2020  Medication  . cyanocobalamin ((VITAMIN B-12)) injection 1,000 mcg   CCM LCSW spoke with patient's daughter on 07/26/20 and was informed that family would need to reschedule CCM appointment for today as patient is very sick.  CCM LCSW rescheduled appointment for 07/31/20.   Follow Up Plan: SW will follow up with patient by phone over the next week  Dickie La, BSW, MSW, LCSW [facility] Care Management [place]  [facility] [email or web address] Phone: [phone]

## 2020-07-31 ENCOUNTER — Ambulatory Visit: Payer: Federal, State, Local not specified - PPO | Admitting: Licensed Clinical Social Worker

## 2020-07-31 ENCOUNTER — Telehealth: Payer: Self-pay | Admitting: Family Medicine

## 2020-07-31 DIAGNOSIS — K219 Gastro-esophageal reflux disease without esophagitis: Secondary | ICD-10-CM

## 2020-07-31 DIAGNOSIS — I10 Essential (primary) hypertension: Secondary | ICD-10-CM

## 2020-07-31 DIAGNOSIS — R413 Other amnesia: Secondary | ICD-10-CM

## 2020-07-31 DIAGNOSIS — F419 Anxiety disorder, unspecified: Secondary | ICD-10-CM

## 2020-07-31 DIAGNOSIS — I739 Peripheral vascular disease, unspecified: Secondary | ICD-10-CM

## 2020-07-31 NOTE — Telephone Encounter (Signed)
Requested medication (s) are due for refill today: no  Requested medication (s) are on the active medication list: yes  Last refill:  04/22/2019  Future visit scheduled: no  Notes to clinic: this refill cannot be delegated  Overdue for appt   Requested Prescriptions  Pending Prescriptions Disp Refills   ALPRAZolam (XANAX) 0.5 MG tablet 30 tablet 0    Sig: Take 1 tablet (0.5 mg total) by mouth daily as needed for anxiety.      Not Delegated - Psychiatry:  Anxiolytics/Hypnotics Failed - 07/31/2020  2:28 PM      Failed - This refill cannot be delegated      Failed - Urine Drug Screen completed in last 360 days      Failed - Valid encounter within last 6 months    Recent Outpatient Visits           7 months ago Polyneuropathy, unspecified   [facility] [place], Megan P, DO   8 months ago Essential hypertension   [facility] [place], [place], DO   10 months ago Memory loss   [facility], [place], DO   1 year ago Temporal pain   [facility] [place], [place], DO   1 year ago Temporal pain   [facility] [place], [place], DO       Future Appointments             In 2 weeks Dan Humphreys, Storm Frisk, NP [facility]               amoxicillin-clavulanate (AUGMENTIN) 875-125 MG tablet      Sig: Take 1 tablet by mouth daily as needed.      Off-Protocol Failed - 07/31/2020  2:28 PM      Failed - Medication not assigned to a protocol, review manually.      Passed - Valid encounter within last 12 months    Recent Outpatient Visits           7 months ago Polyneuropathy, unspecified   [facility] [place], [place], DO   8 months ago Essential hypertension   [facility] [place], [place], DO   10 months ago Memory loss   [facility], [place], DO   1 year ago Temporal pain   [facility] [place], [place], DO   1 year ago  Temporal pain   [facility] [place], Oralia Rud, DO       Future Appointments             In 2 weeks Dan Humphreys, Storm Frisk, NP [facility], [facility]

## 2020-07-31 NOTE — Telephone Encounter (Signed)
Pt called in to request a refill for,   ALPRAZolam (XANAX) 0.5 MG tablet  amoxicillin-clavulanate (AUGMENTIN) 875-125 MG tablet     Pharmacy:  [facility] - [place], [place] - [address] Phone:  [phone]  Fax:  [phone]

## 2020-07-31 NOTE — Chronic Care Management (AMB) (Signed)
Chronic Care Management    Clinical Social Work Note  07/31/2020 Name: Tara [name] MRN: [id number] DOB: 06-17-1946  Tara [name] is a 75 y.o. year old female who is a primary care patient of Valerie Roys, DO. The CCM team was consulted to assist the patient with chronic disease management and/or care coordination needs related to: Level of Care Concerns.   Engaged with patient by telephone for follow up visit in response to provider referral for social work chronic care management and care coordination services.   Consent to Services:  The patient was given the following information about Chronic Care Management services today, agreed to services, and gave verbal consent: 1. CCM service includes personalized support from designated clinical staff supervised by the primary care provider, including individualized plan of care and coordination with other care providers 2. 24/7 contact phone numbers for assistance for urgent and routine care needs. 3. Service will only be billed when office clinical staff spend 20 minutes or more in a month to coordinate care. 4. Only one practitioner may furnish and bill the service in a calendar month. 5.The patient may stop CCM services at any time (effective at the end of the month) by phone call to the office staff. 6. The patient will be responsible for cost sharing (co-pay) of up to 20% of the service fee (after annual deductible is met). Patient agreed to services and consent obtained.  Patient agreed to services and consent obtained.   Assessment/Interventions: Review of patient past medical history, allergies, medications, and health status, including review of relevant consultants reports was performed today as part of a comprehensive evaluation and provision of chronic care management and care coordination services.     SDOH (Social Determinants of Health) assessments and interventions performed:    Advanced Directives Status: See Care  Plan for related entries.  CCM Care Plan  No Known Allergies  Outpatient Encounter Medications as of 07/31/2020  Medication Sig  . ALPRAZolam (XANAX) 0.5 MG tablet Take 1 tablet (0.5 mg total) by mouth daily as needed for anxiety.  Marland Kitchen amLODipine (NORVASC) 10 MG tablet Take 1 tablet (10 mg total) by mouth daily. Please call for an appointment for more refills  . amoxicillin-clavulanate (AUGMENTIN) 875-125 MG tablet Take 1 tablet by mouth daily as needed.  Marland Kitchen aspirin EC 81 MG tablet Take 1 tablet (81 mg total) by mouth daily.  . B-D TB SYRINGE 1CC/27GX1/2" 27G X 1/2" 1 ML MISC USE AS DIRECTED EVERY 30 DAYS  . citalopram (CELEXA) 10 MG tablet TAKE 1 TABLET(10 MG) BY MOUTH TWICE DAILY  . clopidogrel (PLAVIX) 75 MG tablet Take 1 tablet (75 mg total) by mouth daily.  . cyanocobalamin (,VITAMIN B-12,) 1000 MCG/ML injection ADMINISTER 1 ML(1000 MCG) IN THE MUSCLE EVERY 30 DAYS  . diclofenac Sodium (VOLTAREN) 1 % GEL APPLY 4 GRAMS EXTERNALLY TO THE AFFECTED AREA FOUR TIMES DAILY  . donepezil (ARICEPT) 5 MG tablet Take 5 mg by mouth daily.  . famotidine (PEPCID) 20 MG tablet TAKE 1 TABLET(20 MG) BY MOUTH AT BEDTIME  . fenofibrate micronized (LOFIBRA) 200 MG capsule TAKE 1 CAPSULE(200 MG) BY MOUTH DAILY  . gabapentin (NEURONTIN) 100 MG capsule 1 cap in AM, 1 cap in PM, 2 caps QHS  . hydrochlorothiazide (HYDRODIURIL) 25 MG tablet TAKE 1 TABLET(25 MG) BY MOUTH DAILY Please call for an appointment for more refills  . HYDROcodone-acetaminophen (NORCO/VICODIN) 5-325 MG tablet Take 1 tablet by mouth every 6 (six) hours as needed for moderate  pain or severe pain.  . Insulin Syringe 27G X 1/2" 1 ML MISC 1 each by Does not apply route every 30 (thirty) days.  . isosorbide mononitrate (IMDUR) 60 MG 24 hr tablet Take 1 tablet (60 mg total) by mouth in the morning and at bedtime.  . lidocaine (LIDODERM) 5 % 2 patches daily.  Marland Kitchen lisinopril (ZESTRIL) 40 MG tablet TAKE 1 TABLET(40 MG) BY MOUTH TWICE DAILY  . loperamide  (IMODIUM) 2 MG capsule Take 2 mg by mouth as needed for diarrhea or loose stools.  . nitroGLYCERIN (NITROSTAT) 0.4 MG SL tablet Place 1 tablet (0.4 mg total) under the tongue every 5 (five) minutes as needed for chest pain.  . nortriptyline (PAMELOR) 50 MG capsule TAKE 1 CAPSULE(50 MG) BY MOUTH AT BEDTIME  . omeprazole (PRILOSEC) 40 MG capsule TAKE 1 CAPSULE BY MOUTH TWICE DAILY  . ondansetron (ZOFRAN) 4 MG tablet Take 1 tablet (4 mg total) by mouth every 6 (six) hours as needed for nausea or vomiting.  Marland Kitchen oxyCODONE-acetaminophen (PERCOCET/ROXICET) 5-325 MG tablet Take 1 tablet by mouth every 6 (six) hours as needed.  . predniSONE (DELTASONE) 20 MG tablet Take 20 mg by mouth as needed.  . pregabalin (LYRICA) 75 MG capsule 1 cap qHS for 1 week, then increase to 1 cap BID for 1 week, then 1 cap qAM and 2 caps qPM for 1 week, then 2 caps BID until you see me  . rosuvastatin (CRESTOR) 40 MG tablet Take 1 tablet (40 mg total) by mouth daily.  . traZODone (DESYREL) 50 MG tablet Take 50 mg by mouth at bedtime.  . triamcinolone ointment (KENALOG) 0.5 % Apply 1 application topically 2 (two) times daily.   Facility-Administered Encounter Medications as of 07/31/2020  Medication  . cyanocobalamin ((VITAMIN B-12)) injection 1,000 mcg    Patient Active Problem List   Diagnosis Date Noted  . Polyneuropathy, unspecified 12/08/2019  . Difficulty sleeping 10/29/2019  . Mild cognitive impairment 10/29/2019  . Right leg numbness 10/29/2019  . Lymphedema 10/26/2019  . Atherosclerosis of native arteries of extremity with intermittent claudication ([place]) 10/26/2019  . Nerve pain 10/12/2019  . Memory loss 10/12/2019  . PAD (peripheral artery disease) ([place]) 08/02/2019  . Temporal arteritis ([place])   . Other intervertebral disc degeneration, thoracic region 06/21/2019  . GERD (gastroesophageal reflux disease) 06/21/2019  . Angiodysplasia of intestinal tract   . Intestinal bypass or anastomosis status   . History  of aortic valve replacement 02/07/2019  . B12 deficiency 02/07/2019  . Anxiety 02/05/2019  . HTN (hypertension) 02/05/2019  . Hyperlipidemia 02/05/2019  . IBS (irritable bowel syndrome) 02/05/2019  . Arthritis of both knees 02/05/2019  . CTS (carpal tunnel syndrome) 02/05/2019    Conditions to be addressed/monitored: Dementia; Limited social support, Level of care concerns, ADL IADL limitations, Mental Health Concerns , Social Isolation, Limited access to caregiver, Cognitive Deficits and Memory Deficits  Patient Care Plan: General Social Work (Adult)    Problem Identified: Coping Skills (General Plan of Care)     Goal: Coping Skills Enhanced   Start Date: 07/31/2020  Priority: Medium  Note:   Evidence-based guidance:   Acknowledge, normalize and validate difficulty of making life-long lifestyle changes.   Identify current effective and ineffective coping strategies.   Encourage patient and caregiver participation in care to increase self-esteem, confidence and feelings of control.   Consider alternative and complementary therapy approaches such as meditation, mindfulness or yoga.   Encourage participation in cognitive behavioral therapy to foster a  positive identity, increase self-awareness, as well as bolster self-esteem, confidence and self-efficacy.   Discuss spirituality; be present as concerns are identified; encourage journaling, prayer, worship services, meditation or pastoral counseling.   Encourage participation in pleasurable group activities such as hobbies, singing, sports or volunteering).   Encourage the use of mindfulness; refer for training or intensive intervention.   Consider the use of meditative movement therapy such as tai chi, yoga or qigong.   Promote a regular daily exercise program based on tolerance, ability and patient choice to support positive thinking about disease or aging.   Notes:   Timeframe:  Long-Range Goal Priority:  Medium Start Date:  07/31/20                             Expected End Date: 09/28/20                    Follow Up Date -90 days from 07/31/20   - check out options for in-home help, long-term care or hospice - complete a living will - discuss my treatment options with the doctor or nurse - do one enjoyable thing every day - do something different, like talking to a new person or going to a new place, every day - learn something new by asking, reading and searching the Internet every day - make an audio or video recording for my loved ones - make shared treatment decisions with doctor - meditate daily - name a health care proxy (decision maker) - share memories using a picture album or scrapbook with my loved ones - spend time with a child every day, borrow one if I have to - spend time outdoors at least 3 times a week - strengthen or fix relationships with loved ones    Why is this important?    Having a long-term illness can be scary.   It can also be stressful for you and your caregiver.   These steps may help.    Assessment, press & current barriers:  Patient unable to consistently perform activities of daily living and needs additional assistance and support in order to meet this unmet need . Limited social support, Level of care concerns, ADL IADL limitations, Social Isolation, Limited access to caregiver, Cognitive Deficits, Memory Deficits, and Blind . Lacks social connections  Clinical Goals:   Over the next 120 days, patient will have additional in home support education provided to both patient and daughter (caregiver) in order to increase support and safety within the home Over the next 120 days, patient/caregiver will work with SW to address concerns related to care coordination needs, lack of a stable support network and lack of Brewing technologist. LCSW will assist patient in gaining additional support in order to maintain health and mental health appropriately  Over  the next 120 days, patient will demonstrate improved adherence to self care as evidenced by implementing healthy self-care into her daily routine such as: attending all medical appointments, deep breathing exercises, taking time for self-reflection, taking medications as prescribed, drinking water and daily exercise to improve mobility and mood.  Over the next 120 days, patient will demonstrate improved health management independence as evidenced by implementing healthy self-care skills and positive support/resource implementation into their daily routine to help cope with stressors and improve overall health and well-being  Over the next 120 days, patient or caregiver will verbalize basic understanding of depression/stress process and self health management plan as  evidenced by her participation in development of long term plan of care and institution of self health management strategies  Interventions : . Assessed needs, level of care concerns, basic eligibility and provided education on in home support resources . Reviewed community support options (PCS, CHORE, [facility], CAP, private pay, PACE program) . Collaborate with primary care provider per patient request. Patient wishes for PCP to know that she is battling another sinus infection.  . Provided family with community resources for the blind. DSS Social Worker of the Blind- Phone: [phone]. The [place] Division of Services for the Blind (DSB) provides services to people who are blind or visually impaired to help them reach their goals of independence and [phone]. Cleora Fleet, Adaptive and Inclusive Recreation [phone]. These resources and other PCS resources were sent to daughter's email on 07/31/20.  . Patient reports that her memory loss has increased since her most recent stroke. She reports ongoing issues with finding her words in order to communicate. . Patient reports that her xanax medication  continues to help her manage her anxiety and panic.  Marland Kitchen Patient's spouse is also in need of a caregiver as his last aide through the [place] ended her services. Family is currently working with [place] social worker to get these services started again.  . Patient has a recent neurologist appointment and will have a functional capacity test completed in [place] this week.  . Patient reports that her main socialization comes from talking over the phone to her sister, friend and cousin.  . Patient interviewed and appropriate assessments performed . Discussed plans with patient for ongoing care management follow up and provided patient with direct contact information for care management team . Assisted patient/caregiver with obtaining information about health plan benefits . Provided education and assistance to client regarding Advanced Directives. . Provided education to patient/caregiver regarding level of care options. . Provided education to patient/caregiver about Hospice and/or Palliative Care services . Other interventions provided: Solution-Focused Strategies Patient Goals/Self-Care Activities: Over the next 120 days . Review resources provided  . Call agencies listed in email that was sent out on 07/31/20.    Task: Support Psychosocial Response to Risk or Actual Health Condition   Note:   Care Management Activities:    - active listening utilized - counseling provided - current coping strategies identified - decision-making supported - healthy lifestyle promoted - journaling promoted - meditative movement therapy encouraged - mindfulness encouraged - participation in counseling encouraged - problem-solving facilitated - relaxation techniques promoted - self-reflection promoted - spiritual activities promoted - verbalization of feelings encouraged    Notes:       Follow Up Plan: SW will follow up with patient by phone over the next quarter      Eula Fried, BSW, MSW, [email or web address] Phone: [phone]

## 2020-08-03 NOTE — Telephone Encounter (Signed)
Pt scheduled for apt 08/03/2020 for virtual with Dr.Johnson.

## 2020-08-03 NOTE — Telephone Encounter (Signed)
Patient is calling to check on the status of the request of medication. Please advise CB- [phone]

## 2020-08-04 ENCOUNTER — Ambulatory Visit: Payer: Federal, State, Local not specified - PPO | Attending: Neurology | Admitting: Physical Therapy

## 2020-08-04 ENCOUNTER — Telehealth (INDEPENDENT_AMBULATORY_CARE_PROVIDER_SITE_OTHER): Payer: Federal, State, Local not specified - PPO | Admitting: Family Medicine

## 2020-08-04 ENCOUNTER — Encounter: Payer: Self-pay | Admitting: Family Medicine

## 2020-08-04 ENCOUNTER — Encounter: Payer: Self-pay | Admitting: Physical Therapy

## 2020-08-04 ENCOUNTER — Other Ambulatory Visit: Payer: Self-pay

## 2020-08-04 VITALS — BP 135/65 | HR 71 | Wt 185.0 lb

## 2020-08-04 DIAGNOSIS — I10 Essential (primary) hypertension: Secondary | ICD-10-CM | POA: Diagnosis not present

## 2020-08-04 DIAGNOSIS — Z8673 Personal history of transient ischemic attack (TIA), and cerebral infarction without residual deficits: Secondary | ICD-10-CM | POA: Diagnosis present

## 2020-08-04 DIAGNOSIS — E782 Mixed hyperlipidemia: Secondary | ICD-10-CM

## 2020-08-04 DIAGNOSIS — R2689 Other abnormalities of gait and mobility: Secondary | ICD-10-CM | POA: Insufficient documentation

## 2020-08-04 DIAGNOSIS — R269 Unspecified abnormalities of gait and mobility: Secondary | ICD-10-CM | POA: Diagnosis present

## 2020-08-04 DIAGNOSIS — R7301 Impaired fasting glucose: Secondary | ICD-10-CM

## 2020-08-04 DIAGNOSIS — G629 Polyneuropathy, unspecified: Secondary | ICD-10-CM

## 2020-08-04 DIAGNOSIS — F419 Anxiety disorder, unspecified: Secondary | ICD-10-CM | POA: Diagnosis not present

## 2020-08-04 DIAGNOSIS — G3184 Mild cognitive impairment, so stated: Secondary | ICD-10-CM | POA: Diagnosis present

## 2020-08-04 DIAGNOSIS — M6281 Muscle weakness (generalized): Secondary | ICD-10-CM | POA: Insufficient documentation

## 2020-08-04 DIAGNOSIS — I69312 Visuospatial deficit and spatial neglect following cerebral infarction: Secondary | ICD-10-CM

## 2020-08-04 MED ORDER — FAMOTIDINE 20 MG PO TABS
ORAL_TABLET | ORAL | 1 refills | Status: DC
Start: 2020-08-04 — End: 2020-12-11

## 2020-08-04 MED ORDER — AMLODIPINE BESYLATE 10 MG PO TABS
10.0000 mg | ORAL_TABLET | Freq: Every day | ORAL | 1 refills | Status: DC
Start: 2020-08-04 — End: 2020-12-11

## 2020-08-04 MED ORDER — ALPRAZOLAM 0.5 MG PO TABS: 0.5000 mg | ORAL_TABLET | Freq: Every day | ORAL | 0 refills | Status: DC | PRN

## 2020-08-04 MED ORDER — PREGABALIN 75 MG PO CAPS
ORAL_CAPSULE | ORAL | 1 refills | Status: DC
Start: 2020-08-04 — End: 2020-09-12

## 2020-08-04 MED ORDER — FENOFIBRATE 200 MG PO CAPS
ORAL_CAPSULE | ORAL | 1 refills | Status: DC
Start: 2020-08-04 — End: 2020-12-11

## 2020-08-04 MED ORDER — CITALOPRAM HYDROBROMIDE 40 MG PO TABS
40.0000 mg | ORAL_TABLET | Freq: Every day | ORAL | 2 refills | Status: DC
Start: 2020-08-04 — End: 2020-10-30

## 2020-08-04 MED ORDER — AMOXICILLIN-POT CLAVULANATE 875-125 MG PO TABS
1.0000 | ORAL_TABLET | Freq: Two times a day (BID) | ORAL | 0 refills | Status: DC
Start: 2020-08-04 — End: 2020-09-12

## 2020-08-04 MED ORDER — NORTRIPTYLINE HCL 50 MG PO CAPS
50.0000 mg | ORAL_CAPSULE | Freq: Every day | ORAL | 1 refills | Status: DC
Start: 2020-08-04 — End: 2020-10-10

## 2020-08-04 MED ORDER — OMEPRAZOLE 40 MG PO CPDR
DELAYED_RELEASE_CAPSULE | ORAL | 1 refills | Status: DC
Start: 2020-08-04 — End: 2020-12-11

## 2020-08-04 MED ORDER — HYDROCHLOROTHIAZIDE 25 MG PO TABS
ORAL_TABLET | ORAL | 1 refills | Status: DC
Start: 2020-08-04 — End: 2020-09-12

## 2020-08-04 MED ORDER — LISINOPRIL 40 MG PO TABS
ORAL_TABLET | ORAL | 1 refills | Status: DC
Start: 2020-08-04 — End: 2020-12-11

## 2020-08-04 NOTE — Progress Notes (Signed)
BP 135/65   Pulse 71   Wt 185 lb (83.9 kg)   SpO2 98%   BMI 31.76 kg/m    Subjective:    Patient ID: Tara [name], female    DOB: 07-08-1946, 75 y.o.   MRN: [id number]  HPI: Tara [name] is a 75 y.o. female  Chief Complaint  Patient presents with  . Anxiety  . Hyperlipidemia  . Hypertension  . Cerebrovascular Accident    Pt states she has been having several mini strokes since July, does go to Neuro. Pt states she had another one around Thanksgiving that has really messed with her vision, has an appointment with [facility].   Marland Kitchen URI    Pt states she has been having congestion issues since late December   HYPERTENSION / HYPERLIPIDEMIA Satisfied with current treatment? yes Duration of hypertension: chronic BP monitoring frequency: a few times a week BP medication side effects: no Duration of hyperlipidemia: chronic Cholesterol medication side effects: no Cholesterol supplements: none Past cholesterol medications: crestor and fenofibrate Medication compliance: excellent compliance Aspirin: no Recent stressors: yes Recurrent headaches: no Visual changes: no Palpitations: no Dyspnea: no Chest pain: no Lower extremity edema: no Dizzy/lightheaded: no  ANXIETY/STRESS- just was given clonazepam by Dr. Malvin Johns 9 days ago she states that she had hallucinations after the first time taking it. She has not called him to let him know that this happened and she threw out her clonazepam. She continues with tremor.  Duration: chronic Status:stable Anxious mood: yes  Excessive worrying: yes Irritability: yes  Sweating: no Nausea: no Palpitations:no Hyperventilation: no Panic attacks: no Agoraphobia: no  Obscessions/compulsions: no Depressed mood: no Depression screen [facility] 2/9 08/04/2020 04/22/2019 02/05/2019  Decreased Interest 0 2 2  Down, Depressed, Hopeless 0 2 2  PHQ - 2 Score 0 4 4  Altered sleeping 0 3 3  Tired, decreased energy 3 3 2   Change in  appetite 0 2 3  Feeling bad or failure about yourself  0 2 2  Trouble concentrating 1 2 1   Moving slowly or fidgety/restless 0 0 2  Suicidal thoughts 0 0 0  PHQ-9 Score 4 16 17   Difficult doing work/chores Somewhat difficult Extremely dIfficult Very difficult   Anhedonia: no Weight changes: no Insomnia: no   Hypersomnia: no Fatigue/loss of energy:  Feelings of worthlessness: no Feelings of guilt: no Impaired concentration/indecisiveness: no Suicidal ideations: no  Crying spells: no Recent Stressors/Life Changes: yes   Relationship problems: no   Family stress: yes     Financial stress: no    Job stress: no    Recent death/loss: no  UPPER RESPIRATORY TRACT INFECTION- was just on a z-pack from Dr. until 07/31/20 Duration: 3 weeks.  Worst symptom: facial pain Fever: no Cough: yes Shortness of breath: no Wheezing: no Chest pain: no Chest tightness: no Chest congestion: no Nasal congestion: yes Runny nose: yes Post nasal drip: no Sneezing: no Sore throat: yes Swollen glands: no Sinus pressure: yes Headache: no Face pain: no Toothache: no Ear pain: yes bilateral Ear pressure: yes bilateral Eyes red/itching:no Eye drainage/crusting: no  Vomiting: no Rash: no Fatigue: yes Sick contacts: no Strep contacts: no  Context: stable Recurrent sinusitis: no Relief with OTC cold/cough medications: no  Treatments attempted: antibiotics    Relevant past medical, surgical, family and social history reviewed and updated as indicated. Interim medical history since our last visit reviewed. Allergies and medications reviewed and updated.  Review of Systems  Constitutional: Negative.  HENT: Positive for congestion, postnasal drip, rhinorrhea, sinus pressure and sinus pain. Negative for dental problem, drooling, ear discharge, ear pain, facial swelling, hearing loss, mouth sores, nosebleeds, sneezing, sore throat, tinnitus, trouble swallowing and voice change.    Respiratory: Negative.   Cardiovascular: Negative.   Gastrointestinal: Negative.   Musculoskeletal: Negative.   Skin: Negative.   Psychiatric/Behavioral: Positive for dysphoric mood. Negative for agitation, behavioral problems, confusion, decreased concentration, hallucinations, self-injury, sleep disturbance and suicidal ideas. The patient is nervous/anxious. The patient is not hyperactive.     Per HPI unless specifically indicated above     Objective:    BP 135/65   Pulse 71   Wt 185 lb (83.9 kg)   SpO2 98%   BMI 31.76 kg/m   Wt Readings from Last 3 Encounters:  08/04/20 185 lb (83.9 kg)  05/01/20 185 lb 6 oz (84.1 kg)  12/14/19 180 lb 6.4 oz (81.8 kg)    Physical Exam Vitals and nursing note reviewed.  Pulmonary:     Effort: Pulmonary effort is normal. No respiratory distress.     Comments: Speaking in full sentences Neurological:     Mental Status: She is alert.  Psychiatric:        Mood and Affect: Mood normal.        Behavior: Behavior normal.        Thought Content: Thought content normal.        Judgment: Judgment normal.     Results for orders placed or performed in visit on 07/11/20  ECHOCARDIOGRAM COMPLETE  Result Value Ref Range   AR max vel 1.54 cm2   AV Peak grad 33.6 mmHg   Ao pk vel 2.90 m/s   S' Lateral 2.30 cm   Area-P 1/2 1.65 cm2   AV Area VTI 1.49 cm2   AV Mean grad 15.8 mmHg   AV Area mean vel 1.58 cm2      Assessment & Plan:   Problem List Items Addressed This Visit      Cardiovascular and Mediastinum   HTN (hypertension)    Under good control on current regimen. Continue current regimen. Continue to monitor. Call with any concerns. Refills given. Will get labs in 2 weeks at follow up.        Relevant Medications   amLODipine (NORVASC) 10 MG tablet   fenofibrate micronized (LOFIBRA) 200 MG capsule   hydrochlorothiazide (HYDRODIURIL) 25 MG tablet   lisinopril (ZESTRIL) 40 MG tablet     Nervous and Auditory   Polyneuropathy,  unspecified    Under good control on current regimen. Continue current regimen. Continue to monitor. Call with any concerns. Refills of lyrica given. Will get labs in 2 weeks at follow up.       Relevant Medications   nortriptyline (PAMELOR) 50 MG capsule   pregabalin (LYRICA) 75 MG capsule   citalopram (CELEXA) 40 MG tablet   ALPRAZolam (XANAX) 0.5 MG tablet   Other Relevant Orders   AMB Referral to Community Care Coordinaton     Other   Anxiety - Primary    Did not tolerate clonazepam. Has not been on her xanax in months. Refill given- discussed with patient that if she has hallucinations on it she should stop it and call right away. Follow up 2 weeks.       Relevant Medications   nortriptyline (PAMELOR) 50 MG capsule   citalopram (CELEXA) 40 MG tablet   ALPRAZolam (XANAX) 0.5 MG tablet   Other Relevant Orders   AMB  Referral to [facility] Coordinaton   Hyperlipidemia    Under good control on current regimen. Continue current regimen. Continue to monitor. Call with any concerns. Refills given. Will get labs in 2 weeks at follow up.       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 Needs help at home and help with transportation. Referrals generated today. Call with any concerns.      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 Needs help at home and help with transportation. Referrals generated today. Call with any concerns.      Relevant Orders   Ambulatory referral to Home Health   AMB Referral to [facility] Coordinaton     Other Visit Diagnoses    Essential hypertension       Relevant Medications   amLODipine (NORVASC) 10 MG tablet   fenofibrate micronized (LOFIBRA) 200 MG capsule   hydrochlorothiazide (HYDRODIURIL) 25 MG tablet   lisinopril (ZESTRIL) 40 MG tablet   Other Relevant Orders   AMB Referral to Community Care Coordinaton   IFG (impaired fasting glucose)       Relevant Orders   AMB Referral to [facility] Coordinaton       Follow up plan: Return in about 2 weeks (around 08/18/2020).   . This visit was completed via telephone due to the restrictions of the COVID-19 pandemic. All issues as above were discussed and addressed but no physical exam was performed. If it was felt that the patient should be evaluated in the office, they were directed there. The patient verbally consented to this visit. Patient was unable to complete an audio/visual visit due to Lack of equipment. Due to the catastrophic nature of the COVID-19 pandemic, this visit was done through audio contact only. . Location of the patient: home . Location of the provider: home . Those involved with this call:  . Provider: Olevia Perches, DO . CMA: Wilhemena Durie, CMA . Front Desk/Registration: Harriet Pho  . Time spent on call: 25 minutes on the phone discussing health concerns. 40 minutes total spent in review of patient's record and preparation of their chart.

## 2020-08-04 NOTE — Therapy (Signed)
[facility] [address] [place], [place], [address] Phone: [phone]   Fax:  [phone]  Physical Therapy Evaluation  Patient Details  Name: Tara [name] MRN: [id number] Date of Birth: 04/03/1946 Referring Provider (PT): Dr. Malvin Johns   Encounter Date: 08/04/2020   PT End of Session - 08/04/20 0941    Visit Number 1    Number of Visits 1    Date for PT Re-Evaluation 08/04/20    PT Start Time 0806    PT Stop Time 0905    PT Time Calculation (min) 59 min    Equipment Utilized During Treatment Gait belt    Activity Tolerance Patient tolerated treatment well    Behavior During Therapy [facility] for tasks assessed/performed           Past Medical History:  Diagnosis Date  . Anxiety   . Arthritis   . Barrett's syndrome   . Carpal tunnel syndrome   . Depression   . GERD (gastroesophageal reflux disease)   . Heart murmur   . Hyperlipidemia   . Hypertension   . Myocardial infarction (HCC)    08  . PAD (peripheral artery disease) (HCC)     Past Surgical History:  Procedure Laterality Date  . ABDOMINAL AORTOGRAM W/LOWER EXTREMITY N/A 07/28/2019   Procedure: ABDOMINAL AORTOGRAM W/LOWER EXTREMITY;  Surgeon: Iran Ouch, MD;  Location: [facility];  Service: Cardiovascular;  Laterality: N/A;  . ABDOMINAL HYSTERECTOMY    . ARTERY BIOPSY Right 07/12/2019   Procedure: BIOPSY TEMPORAL ARTERY;  Surgeon: Henrene Dodge, MD;  Location: [facility] ORS;  Service: General;  Laterality: Right;  . BREAST BIOPSY Left 2007   Neg- [place]  . CARDIAC CATHETERIZATION    . CARDIAC VALVE REPLACEMENT    . CHOLECYSTECTOMY    . COLON SURGERY    . COLONOSCOPY WITH PROPOFOL N/A 04/09/2019   Procedure: COLONOSCOPY WITH PROPOFOL;  Surgeon: Pasty Spillers, MD;  Location: [facility] ENDOSCOPY;  Service: Endoscopy;  Laterality: N/A;  . CORONARY ANGIOPLASTY WITH STENT PLACEMENT  2008   [place]; [facility] Stent placement x 1   . DENTAL  SURGERY    . ESOPHAGOGASTRODUODENOSCOPY (EGD) WITH PROPOFOL N/A 04/09/2019   Procedure: ESOPHAGOGASTRODUODENOSCOPY (EGD) WITH PROPOFOL;  Surgeon: Pasty Spillers, MD;  Location: [facility] ENDOSCOPY;  Service: Endoscopy;  Laterality: N/A;  . EXCISIONAL HEMORRHOIDECTOMY    . EYE SURGERY    . FEMORAL-POPLITEAL BYPASS GRAFT Right 08/02/2019   Procedure: BYPASS GRAFT FEMORAL-POPLITEAL ARTERY;  Surgeon: Larina Earthly, MD;  Location: [facility] OR;  Service: Vascular;  Laterality: Right;  . HERNIA REPAIR    . LOWER EXTREMITY ANGIOGRAPHY Left 11/11/2019   Procedure: LOWER EXTREMITY ANGIOGRAPHY;  Surgeon: Annice Needy, MD;  Location: [facility] INVASIVE CV LAB;  Service: Cardiovascular;  Laterality: Left;  Marland Kitchen MULTIPLE TOOTH EXTRACTIONS    . TONSILLECTOMY      There were no vitals filed for this visit.    Subjective Assessment - 08/04/20 0927    Subjective Pt. arrived to PT with reports of 6/10 L hip pain while sitting in w/c.  Pt. accompanied by daughter [name]) to PT tx. session.  Pt. states she is vision impaired and unable to read/ use cell phone/ prepare meals/ manage pills.  Pt. reports 7 falls in past several months with no injury.  Pt. falls due to tremors in lower extremity.  Pts. daughter reports pt. had multiple mini-strokes over teh psat several months.  Pt. had a  TIA 2 days ago.    Pertinent History See MD notes/ [facility] forms.    Limitations Sitting;Standing;Walking;House hold activities    How long can you sit comfortably? 1 hour    How long can you stand comfortably? 5 minutes    How long can you walk comfortably? 5 minutes    Patient Stated Goals Complete [facility] paperwork for Examination for housebound status or permanent need for regular aid and attendance    Currently in Pain? Yes    Pain Score 6     Pain Location Hip    Pain Orientation Left    Pain Descriptors / Indicators Sore    Pain Type Chronic pain             See scanned paperwork in EPIC      Plan - 08/04/20 0943    Clinical  Impression Statement Pt. is a 75 y/o female referred to PT to determine functional status/ housebound status (see paperwork).  Pt. presents to PT with daughter assistance due to poor vision/ physical mobility.  Pt. reports 6/10 L hip pain/ soreness while sitting in w/c/ lying in bed.  Pt. has had multiple TIAs and falls over past several months.  Pt. lives with husband, who had a caregiver aide for 2 hours/day until recently.  Pts. daughter has stayed with parents past 2 weeks to assist mother with bathing/ walking/ meal prep/ pill management.  Pts. husband has multiple health issues and is at a high fall risk.  Pt. able to use toilet independently and feed herself, once plate is set up.  Pt. reports word finding issues.  Pt. sleeps a lot during the day and limited to house secondary to walking issues/ high fall risk with RW.  Seated posture: slight forward head/ rounded shoulder while sitting in w/c and standing in front of w/c with CGA.  Cervical/ lumbar AROM WFL (stiffness noted)- CGA during lumbar assessment. B shoulder AROM WFL and strength grossly 4/5 MMT.  B LE AROM WFL and hip strength grossly 4/5 MMT, quad/hamstrings 5/5 MMT and ankle 4+/5 MMT.  Pt. requires CGA to moderate assistance with safe standing from w/c (cuing to lock brakes) and stand for 3 minutes in front of w/c.  Slight increase in leg tremors noted but not loss of balance.  Pt. ambulates with slow, shuffling gait pattern with limited hip/knee flexion and heel strike while using RW.  Pt. requires assist in PT clinic to guide RW due to poor vision and fear of falling.  Decrease balance noted with eyes closed standing/ head turns.  Pts. paperwork completed and faxed to pts. daughter and Dr. Daisy Blossom office.    Stability/Clinical Decision Making Unstable/Unpredictable    Clinical Decision Making High    Rehab Potential Fair    PT Frequency One time visit    PT Treatment/Interventions ADLs/Self Care Home Management;Therapeutic  activities;Balance training;Neuromuscular re-education;Functional mobility training    PT Next Visit Plan See paperwork           Patient will benefit from skilled therapeutic intervention in order to improve the following deficits and impairments:  Abnormal gait,Improper body mechanics,Pain,Decreased mobility,Postural dysfunction,Decreased activity tolerance,Decreased endurance,Decreased range of motion,Decreased strength,Impaired flexibility,Decreased balance,Difficulty walking,Decreased safety awareness  Visit Diagnosis: History of stroke  Muscle weakness (generalized)  Gait difficulty  Balance disorder  Mild cognitive impairment     Problem List Patient Active Problem List   Diagnosis Date Noted  . Polyneuropathy, unspecified 12/08/2019  . Difficulty sleeping 10/29/2019  . Mild cognitive impairment  10/29/2019  . Right leg numbness 10/29/2019  . Lymphedema 10/26/2019  . Atherosclerosis of native arteries of extremity with intermittent claudication (HCC) 10/26/2019  . Nerve pain 10/12/2019  . Memory loss 10/12/2019  . PAD (peripheral artery disease) (HCC) 08/02/2019  . Temporal arteritis (HCC)   . Other intervertebral disc degeneration, thoracic region 06/21/2019  . GERD (gastroesophageal reflux disease) 06/21/2019  . Angiodysplasia of intestinal tract   . Intestinal bypass or anastomosis status   . History of aortic valve replacement 02/07/2019  . B12 deficiency 02/07/2019  . Anxiety 02/05/2019  . HTN (hypertension) 02/05/2019  . Hyperlipidemia 02/05/2019  . IBS (irritable bowel syndrome) 02/05/2019  . Arthritis of both knees 02/05/2019  . CTS (carpal tunnel syndrome) 02/05/2019   Cammie Mcgee, PT, DPT # [id number] 08/04/2020, 10:10 AM  [facility] [facility] [address] [place], [place], [address] Phone: [phone]   Fax:  [phone]  Name: Tara [name] MRN: [id number] Date of Birth: 1946/06/12

## 2020-08-06 DIAGNOSIS — I69312 Visuospatial deficit and spatial neglect following cerebral infarction: Secondary | ICD-10-CM | POA: Insufficient documentation

## 2020-08-06 DIAGNOSIS — Z8673 Personal history of transient ischemic attack (TIA), and cerebral infarction without residual deficits: Secondary | ICD-10-CM | POA: Insufficient documentation

## 2020-08-06 NOTE — Assessment & Plan Note (Signed)
Following with neurology. Needs help at home and help with transportation. Referrals generated today. Call with any concerns.

## 2020-08-06 NOTE — Assessment & Plan Note (Signed)
Under good control on current regimen. Continue current regimen. Continue to monitor. Call with any concerns. Refills of lyrica given. Will get labs in 2 weeks at follow up.

## 2020-08-06 NOTE — Assessment & Plan Note (Signed)
Following with neurology. Needs help at home and help with transportation. Referrals generated today. Call with any concerns. 

## 2020-08-06 NOTE — Assessment & Plan Note (Signed)
Under good control on current regimen. Continue current regimen. Continue to monitor. Call with any concerns. Refills given. Will get labs in 2 weeks at follow up.  

## 2020-08-06 NOTE — Assessment & Plan Note (Signed)
Under good control on current regimen. Continue current regimen. Continue to monitor. Call with any concerns. Refills given. Will get labs in 2 weeks at follow up.

## 2020-08-06 NOTE — Assessment & Plan Note (Signed)
Did not tolerate clonazepam. Has not been on her xanax in months. Refill given- discussed with patient that if she has hallucinations on it she should stop it and call right away. Follow up 2 weeks.

## 2020-08-08 ENCOUNTER — Telehealth: Payer: Federal, State, Local not specified - PPO | Admitting: General Practice

## 2020-08-08 ENCOUNTER — Ambulatory Visit: Payer: Self-pay | Admitting: General Practice

## 2020-08-08 DIAGNOSIS — I69312 Visuospatial deficit and spatial neglect following cerebral infarction: Secondary | ICD-10-CM

## 2020-08-08 DIAGNOSIS — E782 Mixed hyperlipidemia: Secondary | ICD-10-CM

## 2020-08-08 DIAGNOSIS — G3184 Mild cognitive impairment, so stated: Secondary | ICD-10-CM

## 2020-08-08 DIAGNOSIS — I10 Essential (primary) hypertension: Secondary | ICD-10-CM

## 2020-08-08 DIAGNOSIS — Z748 Other problems related to care provider dependency: Secondary | ICD-10-CM

## 2020-08-08 DIAGNOSIS — Z8673 Personal history of transient ischemic attack (TIA), and cerebral infarction without residual deficits: Secondary | ICD-10-CM

## 2020-08-08 DIAGNOSIS — R413 Other amnesia: Secondary | ICD-10-CM

## 2020-08-08 DIAGNOSIS — F419 Anxiety disorder, unspecified: Secondary | ICD-10-CM

## 2020-08-08 NOTE — Chronic Care Management (AMB) (Signed)
Chronic Care Management   CCM RN Visit Note  08/08/2020 Name: Tara [name] MRN: [id number] DOB: 06/23/1946  Subjective: Tara [name] is a 75 y.o. year old female who is a primary care patient of Valerie Roys, DO. The care management team was consulted for assistance with disease management and care coordination needs.    Engaged with patient by telephone for initial visit in response to provider referral for case management and/or care coordination services.   Consent to Services:  The patient was given the following information about Chronic Care Management services today, agreed to services, and gave verbal consent: 1. CCM service includes personalized support from designated clinical staff supervised by the primary care provider, including individualized plan of care and coordination with other care providers 2. 24/7 contact phone numbers for assistance for urgent and routine care needs. 3. Service will only be billed when office clinical staff spend 20 minutes or more in a month to coordinate care. 4. Only one practitioner may furnish and bill the service in a calendar month. 5.The patient may stop CCM services at any time (effective at the end of the month) by phone call to the office staff. 6. The patient will be responsible for cost sharing (co-pay) of up to 20% of the service fee (after annual deductible is met). Patient agreed to services and consent obtained.  Patient agreed to services and verbal consent obtained.   Assessment: Review of patient past medical history, allergies, medications, health status, including review of consultants reports, laboratory and other test data, was performed as part of comprehensive evaluation and provision of chronic care management services.   SDOH (Social Determinants of Health) assessments and interventions performed:  SDOH Interventions   Flowsheet Row Most Recent Value  SDOH Interventions   Physical Activity Interventions  Other (Comments)  [no structured activity]  Stress Interventions Other (Comment)  [LCSW referral]  Social Connections Interventions Other (Comment)  [adult daughter is currently in the home]  Transportation Interventions Other (Comment)  [asking for transportation [place]  No Known Allergies  Outpatient Encounter Medications as of 08/08/2020  Medication Sig   ALPRAZolam (XANAX) 0.5 MG tablet Take 1 tablet (0.5 mg total) by mouth daily as needed for anxiety.   amLODipine (NORVASC) 10 MG tablet Take 1 tablet (10 mg total) by mouth daily.   amoxicillin-clavulanate (AUGMENTIN) 875-125 MG tablet Take 1 tablet by mouth 2 (two) times daily.   aspirin EC 81 MG tablet Take 1 tablet (81 mg total) by mouth daily.   citalopram (CELEXA) 40 MG tablet Take 1 tablet (40 mg total) by mouth daily.   clopidogrel (PLAVIX) 75 MG tablet Take 1 tablet (75 mg total) by mouth daily.   cyanocobalamin (,VITAMIN B-12,) 1000 MCG/ML injection ADMINISTER 1 ML(1000 MCG) IN THE MUSCLE EVERY 30 DAYS   diclofenac Sodium (VOLTAREN) 1 % GEL APPLY 4 GRAMS EXTERNALLY TO THE AFFECTED AREA FOUR TIMES DAILY   famotidine (PEPCID) 20 MG tablet TAKE 1 TABLET(20 MG) BY MOUTH AT BEDTIME   fenofibrate micronized (LOFIBRA) 200 MG capsule TAKE 1 CAPSULE(200 MG) BY MOUTH DAILY   HYDROcodone-acetaminophen (NORCO/VICODIN) 5-325 MG tablet Take 1 tablet by mouth every 6 (six) hours as needed for moderate pain or severe pain.   lidocaine (LIDODERM) 5 % 2 patches daily.   lisinopril (ZESTRIL) 40 MG tablet TAKE 1 TABLET(40 MG) BY MOUTH TWICE DAILY   loperamide (IMODIUM) 2 MG capsule Take 2 mg by mouth as needed  for diarrhea or loose stools.   nitroGLYCERIN (NITROSTAT) 0.4 MG SL tablet Place 1 tablet (0.4 mg total) under the tongue every 5 (five) minutes as needed for chest pain.   omeprazole (PRILOSEC) 40 MG capsule TAKE 1 CAPSULE BY MOUTH TWICE DAILY   ondansetron (ZOFRAN) 4 MG tablet Take 1 tablet (4 mg  total) by mouth every 6 (six) hours as needed for nausea or vomiting.   oxyCODONE-acetaminophen (PERCOCET/ROXICET) 5-325 MG tablet Take 1 tablet by mouth every 6 (six) hours as needed.   pregabalin (LYRICA) 75 MG capsule 1 cap qHS for 1 week, then increase to 1 cap BID for 1 week, then 1 cap qAM and 2 caps qPM for 1 week, then 2 caps BID until you see me   traZODone (DESYREL) 50 MG tablet Take 50 mg by mouth at bedtime.   triamcinolone ointment (KENALOG) 0.5 % Apply 1 application topically 2 (two) times daily.   B-D TB SYRINGE 1CC/27GX1/2" 27G X 1/2" 1 ML MISC USE AS DIRECTED EVERY 30 DAYS   donepezil (ARICEPT) 5 MG tablet Take 5 mg by mouth daily. (Patient not taking: Reported on 08/08/2020)   gabapentin (NEURONTIN) 100 MG capsule 1 cap in AM, 1 cap in PM, 2 caps QHS (Patient not taking: Reported on 08/08/2020)   hydrochlorothiazide (HYDRODIURIL) 25 MG tablet TAKE 1 TABLET(25 MG) BY MOUTH DAILY (Patient not taking: Reported on 08/08/2020)   Insulin Syringe 27G X 1/2" 1 ML MISC 1 each by Does not apply route every 30 (thirty) days.   isosorbide mononitrate (IMDUR) 60 MG 24 hr tablet Take 1 tablet (60 mg total) by mouth in the morning and at bedtime. (Patient not taking: Reported on 08/08/2020)   nortriptyline (PAMELOR) 50 MG capsule Take 1 capsule (50 mg total) by mouth at bedtime.   rosuvastatin (CRESTOR) 40 MG tablet Take 1 tablet (40 mg total) by mouth daily. 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   S/P stroke due to cerebrovascular disease 08/06/2020   Polyneuropathy, unspecified 12/08/2019   Difficulty sleeping 10/29/2019   Mild cognitive impairment 10/29/2019   Right leg numbness 10/29/2019   Lymphedema  10/26/2019   Atherosclerosis of native arteries of extremity with intermittent claudication ([place]) 10/26/2019   Nerve pain 10/12/2019   Memory loss 10/12/2019   PAD (peripheral artery disease) ([place]) 08/02/2019   Temporal arteritis ([place])    Other intervertebral disc degeneration, thoracic region 06/21/2019   GERD (gastroesophageal reflux disease) 06/21/2019   Angiodysplasia of intestinal tract    Intestinal bypass or anastomosis status    History of aortic valve replacement 02/07/2019   B12 deficiency 02/07/2019   Anxiety 02/05/2019   HTN (hypertension) 02/05/2019   Hyperlipidemia 02/05/2019   IBS (irritable bowel syndrome) 02/05/2019   Arthritis of both knees 02/05/2019   CTS (carpal tunnel syndrome) 02/05/2019    Conditions to be addressed/monitored:HTN, HLD, Anxiety, Depression and CVA with TIAs and vision changes   Care Plan : RNCM: HLD management  Updates made by Vanita Ingles since 08/08/2020 12:00 AM    Problem: RNCM: HLD management   Priority: Medium    Goal: RNCM: HLD Coping Skills Enhanced   Priority: Medium  Note:   Current Barriers:   Poorly controlled hyperlipidemia, complicated by smoker, memory changes, several chronic  conditions   Current antihyperlipidemic regimen: Is unsure about the medications, referral in for pharmacy support and pcp recommendations   Most recent lipid panel:     Component Value Date/Time   CHOL 210 (H) 10/01/2019 1603   TRIG 172 (H) 10/01/2019 1603   HDL 47 10/01/2019 1603   CHOLHDL 5.5 07/13/2019 1615   VLDL 48 (H) 07/13/2019 1615   LDLCALC 132 (H) 10/01/2019 1603   LDLDIRECT 145.7 (H) 07/13/2019 1615     ASCVD risk enhancing conditions: age >52, HTN, current smoker  Unable to independently manage HLD  Unable to self administer medications as prescribed  Does not attend all scheduled provider appointments  Does not adhere to prescribed medication regimen  Lacks social connections  Unable to perform  ADLs independently  Unable to perform IADLs independently  Does not maintain contact with provider office  Does not contact provider office for questions/concerns  RN Care Manager Clinical Goal(s):   Over the next 120 days, patient will work with Consulting civil engineer, providers, and care team towards execution of optimized self-health management plan  Over the next 120 days, patient will verbalize understanding of plan for effective management of HLD  Over the next 120 days, patient will work with [facility], CCM team and pcp to address needs related to HLD management   Interventions:  Collaboration with Valerie Roys, DO regarding development and update of comprehensive plan of care as evidenced by provider attestation and co-signature  Inter-disciplinary care team collaboration (see longitudinal plan of care)  Medication review performed; medication list updated in electronic medical record.   Inter-disciplinary care team collaboration (see longitudinal plan of care)  Referred to pharmacy team for assistance with HLD medication management  Evaluation of current treatment plan related to HLD and patient's adherence to plan as established by provider.  Advised patient to call the office for changes in conditions or questions  Provided education to patient re: working with CCM team, referrals in place, and collaboration with pcp  Discussed plans with patient for ongoing care management follow up and provided patient with direct contact information for care management team   Patient Goals/Self-Care Activities:  Over the next 120 days, patient will:   - call for medicine refill 2 or 3 days before it runs out - call if I am sick and can't take my medicine - keep a list of all the medicines I take; vitamins and herbals too - learn to read medicine labels - use a pillbox to sort medicine - use an alarm clock or phone to remind me to take my medicine - change to whole grain breads,  cereal, pasta - drink 6 to 8 glasses of water each day - eat 3 to 5 servings of fruits and vegetables each day - eat 5 or 6 small meals each day - fill half the plate with nonstarchy vegetables - limit fast food meals to no more than 1 per week - manage portion size - prepare main meal at home 3 to 5 days each week - read food labels for fat, fiber, carbohydrates and portion size - be open to making changes - I can manage, know and watch for signs of a heart attack - if I have chest pain, call for help - learn about small changes that will make a big difference - learn my personal risk factors  - active listening utilized - caregiver stress acknowledged - counseling provided - current coping strategies identified - decision-making supported - healthy lifestyle promoted -  meditative movement therapy encouraged - mindfulness encouraged - participation in counseling encouraged - problem-solving facilitated - self-reflection promoted - spiritual activities promoted - verbalization of feelings encouraged  Follow Up Plan: Telephone follow up appointment with care management team member scheduled for: 08-16-2020 at 0900     Task: RNCM: HLD-Support Psychosocial Response to Risk or Actual Health Condition   Note:   Care Management Activities:    - active listening utilized - caregiver stress acknowledged - counseling provided - current coping strategies identified - decision-making supported - healthy lifestyle promoted - meditative movement therapy encouraged - mindfulness encouraged - participation in counseling encouraged - problem-solving facilitated - self-reflection promoted - spiritual activities promoted - verbalization of feelings encouraged       Problem: RNCM: Vision Changes Quality of Life (General Plan of Care)   Priority: High    Goal: RNCM: Vision Changes Quality of Life Maintained   Priority: High  Note:   Current Barriers:   Knowledge Deficits  related to vision changes due to CVA  Care Coordination needs related to resources in the community  in a patient with changes in vision and loss of vision   Lacks caregiver support.   Film/video editor.   Transportation barriers  Cognitive Deficits  Unable to independently manage care due to changes in vision  Unable to self administer medications as prescribed  Does not attend all scheduled provider appointments  Does not adhere to prescribed medication regimen  Lacks social connections  Unable to perform ADLs independently  Unable to perform IADLs independently  Does not maintain contact with provider office  Does not contact provider office for questions/concerns  Nurse Case Manager Clinical Goal(s):   Over the next 120 days, patient will verbalize understanding of plan for effective management of meeting needs in patient with vision changes   Over the next 120 days, patient will work with [facility], CCM team, and specialist  to address needs related to vision loss   Over the next 120 days, patient will attend all scheduled medical appointments: eye specialist on 08-10-2020  Interventions:   1:1 collaboration with Valerie Roys, DO regarding development and update of comprehensive plan of care as evidenced by provider attestation and co-signature  Inter-disciplinary care team collaboration (see longitudinal plan of care)  Evaluation of current treatment plan related to loss of vision  and patient's adherence to plan as established by provider.  Advised patient to keep appointment with eye doctor, call office for changes in condition   Provided education to patient re: services for the blind and working with resources to help with patients expressed needs due to vision loss.   Discussed plans with patient for ongoing care management follow up and provided patient with direct contact information for care management team  Reviewed scheduled/upcoming provider  appointments including: eye specialist on 08-15-2020 at 0900 am   Patient Goals/Self-Care Activities Over the next 120 days, patient will:  - Patient will self administer medications as prescribed Patient will attend all scheduled provider appointments Patient will call pharmacy for medication refills Patient will continue to perform ADL's independently Patient will continue to perform IADL's independently Patient will call provider office for new concerns or questions Patient will work with BSW to address care coordination needs and will continue to work with the clinical team to address health care and disease management related needs.    - affirmation provided - community involvement promoted - expression of thoughts about present/future encouraged - independence in all possible areas promoted -  patient strengths promoted - self-expression encouraged - sleep hygiene techniques encouraged - social relationships promoted Follow Up Plan: Telephone follow up appointment with care management team member scheduled for: 08-16-2020 at 0900 am        Task: [facility]: Support and Maintain Acceptable Degree of Health, Comfort and Happiness   Note:   Care Management Activities:    - affirmation provided - community involvement promoted - expression of thoughts about present/future encouraged - independence in all possible areas promoted - patient strengths promoted - self-expression encouraged - sleep hygiene techniques encouraged - social relationships promoted       Care Plan : RNCM: Stroke (Adult)  Updates made by Vanita Ingles since 08/08/2020 12:00 AM    Problem: RNCM: Emotional Adjustment to Disease (Stroke)   Priority: High    Goal: RNCM: Effective management of stroke   Priority: High  Note:   Current Barriers:   Knowledge Deficits related to effective management of s/p stroke and TIas  Care Coordination needs related to community resources and expressed needs  in a  patient with past stroke and changes in condition related to TIAs and mini-strokes   Chronic Disease Management support and education needs related to management of stroke and prevention of new HA and/orstroke   Lacks caregiver support.   Film/video editor.   Transportation barriers  Cognitive Deficits  Unable to independently manage care due to "mini-strokes" and TIAs  Unable to self administer medications as prescribed  Lacks social connections  Unable to perform ADLs independently  Unable to perform IADLs independently  Does not contact provider office for questions/concerns  Nurse Case Manager Clinical Goal(s):   Over the next 120 days, patient will verbalize understanding of plan for effective management of CVA and residual effects from TIAs   Over the next 120 days, patient will work with [facility], CCM team and pcp to address needs related to **care of patient in the home with post residual deficits and decline in condition  Over the next 120 days, patient will demonstrate a decrease in TIA episodes  exacerbations as evidenced by no new CVA events   Over the next 120 days, patient will work with CM team pharmacist to reconcile medications, polypharmacy and dubplications   Over the next 120 days, patient will work with care guides  (community agency) to help with expressed needs: transportation, food resources, and community resources   Interventions:   1:1 collaboration with Valerie Roys, DO regarding development and update of comprehensive plan of care as evidenced by provider attestation and co-signature  Inter-disciplinary care team collaboration (see longitudinal plan of care)  Provided education to patient re: talking to care guides about resources, calling insurance to inquire about life alert, working with CCM team to meet expressed needs   Reviewed medications with patient and discussed compliance and pharmacy referral   Collaborated with pcp and  pharmacy  regarding medication question and concerns  Discussed plans with patient for ongoing care management follow up and provided patient with direct contact information for care management team  Care Guide referral for food, transportation, and community resources   Social Work referral for needs in the home, placement questions- currently working with [place] referral for polypharmacy, medication reconciliation, duplicate medications   Patient Goals/Self-Care Activities Over the next 120 days, patient will:  - Patient will self administer medications as prescribed Patient will attend all scheduled provider appointments Patient will call pharmacy for medication refills Patient will continue to perform ADL's independently  Patient will continue to perform IADL's independently Patient will call provider office for new concerns or questions Patient will work with BSW to address care coordination needs and will continue to work with the clinical team to address health care and disease management related needs.   - caregiver stress acknowledged - caregiver support provided - counseling provided - decision-making supported - depression screen reviewed - family care conference arranged - financial counseling provided - goal-setting facilitated - positive reinforcement provided - problem-solving facilitated - self-care encouraged - self-reflection promoted - verbalization of feelings encouraged   Follow Up Plan: Telephone follow up appointment with care management team member scheduled for: 08-16-2020 at 0900 am       Task: RNCM: Support Psychosocial Response to Stroke   Note:   Care Management Activities:    - caregiver stress acknowledged - caregiver support provided - counseling provided - decision-making supported - depression screen reviewed - family care conference arranged - financial counseling provided - goal-setting facilitated - positive reinforcement  provided - problem-solving facilitated - self-care encouraged - self-reflection promoted - verbalization of feelings encouraged       Care Plan : RNCM: Hypertension (Adult)  Updates made by Vanita Ingles since 08/08/2020 12:00 AM    Problem: RNCM: Hypertension (Hypertension)   Priority: Medium    Goal: RNCM: Hypertension Monitored   Priority: Medium  Note:   Objective:   Last practice recorded BP readings:  BP Readings from Last 3 Encounters:  08/04/20 135/65  05/01/20 (!) 150/90  12/14/19 (!) 149/84     Most recent eGFR/CrCl: No results found for: EGFR  No components found for: CRCL Current Barriers:   Knowledge Deficits related to basic understanding of hypertension pathophysiology and self care management  Knowledge Deficits related to understanding of medications prescribed for management of hypertension  Non-adherence to prescribed medication regimen  Non-adherence to scheduled provider appointments  Transportation barriers  Cognitive Deficits  Limited Social Lawyer.   Unable to independently manage HTN  Unable to self administer medications as prescribed  Does not attend all scheduled provider appointments  Does not adhere to prescribed medication regimen  Lacks social connections  Unable to perform ADLs independently  Unable to perform IADLs independently  Does not maintain contact with provider office  Does not contact provider office for questions/concerns Case Manager Clinical Goal(s):   Over the next 120 days, patient will verbalize understanding of plan for hypertension management  Over the next 120 days, patient will attend all scheduled medical appointments: will see pcp in the next few weeks. Knows to call for an appointment   Over the next 120 days, patient will demonstrate improved adherence to prescribed treatment plan for hypertension as evidenced by taking all medications as prescribed, monitoring and  recording blood pressure as directed, adhering to low sodium/DASH diet  Over the next 120 days, patient will demonstrate improved health management independence as evidenced by checking blood pressure as directed and notifying PCP if SBP>160 or DBP > 90, taking all medications as prescribe, and adhering to a low sodium diet as discussed. Interventions:   Collaboration with Valerie Roys, DO regarding development and update of comprehensive plan of care as evidenced by provider attestation and co-signature  Inter-disciplinary care team collaboration (see longitudinal plan of care)  UNABLE to independently:manage HTN  Evaluation of current treatment plan related to hypertension self management and patient's adherence to plan as established by provider.  Provided education to patient re: stroke prevention, s/s of heart  attack and stroke, DASH diet, complications of uncontrolled blood pressure  Reviewed medications with patient and discussed importance of compliance  Discussed plans with patient for ongoing care management follow up and provided patient with direct contact information for care management team  Advised patient, providing education and rationale, to monitor blood pressure daily and record, calling PCP for findings outside established parameters.  Patient Goals/Self-Care Activities  Over the next 120 days, patient will:  - UNABLE to independently manage HTN Self administers medications as prescribed Attends all scheduled provider appointments Calls provider office for new concerns, questions, or BP outside discussed parameters Checks BP and records as discussed Follows a low sodium diet/DASH diet - blood pressure trends reviewed - depression screen reviewed - home or ambulatory blood pressure monitoring encouraged Follow Up Plan: Telephone follow up appointment with care management team member scheduled for: 08-16-2020 at 900 am   Task: RNCM: Identify and Monitor Blood  Pressure Elevation   Note:   Care Management Activities:    - blood pressure trends reviewed - depression screen reviewed - home or ambulatory blood pressure monitoring encouraged    Notes: Does not have a blood pressure cuff. Checking into OTC benefit with insurance company to obtain a blood pressure cuff.    Care Plan : RNCM: Depression (Adult)  Updates made by Vanita Ingles since 08/08/2020 12:00 AM    Problem: RNCM: Depression Identification (Depression)   Priority: Medium    Goal: RNCM: Depressive Symptoms Identified   Priority: Medium  Note:   Current Barriers:   Knowledge Deficits related to depression and anxiety   Chronic Disease Management support and education needs related to depression and anxiety   Lacks caregiver support.   Film/video editor.   Transportation barriers  Non-adherence to scheduled provider appointments  Non-adherence to prescribed medication regimen  Unable to independently manage depression and anxiety   Unable to self administer medications as prescribed  Does not attend all scheduled provider appointments  Does not adhere to prescribed medication regimen  Lacks social connections  Unable to perform ADLs independently  Unable to perform IADLs independently  Does not maintain contact with provider office  Does not contact provider office for questions/concerns  Nurse Case Manager Clinical Goal(s):   Over the next 120 days, patient will verbalize understanding of plan for management of depression and anxiety  Over the next 120 days, patient will work with RNCM, CCM, and pcp to address needs related to depression and anxiety  Over the next 120 days, patient will attend all scheduled medical appointments: will call for appointment with pcp  Interventions:   1:1 collaboration with Valerie Roys, DO regarding development and update of comprehensive plan of care as evidenced by provider attestation and  co-signature  Inter-disciplinary care team collaboration (see longitudinal plan of care)  Evaluation of current treatment plan related to anxiety and depression  and patient's adherence to plan as established by provider.  Provided education to patient and/or caregiver about advanced directives  Provided education to patient re: calling the office for changes in mood/anxiety/depression  Reviewed medications with patient and discussed compliance, poly pharmacy, and duplications- pharmacy referral   Collaborated with RNCM, pcp, and CCM team regarding patients expressed needs   Discussed plans with patient for ongoing care management follow up and provided patient with direct contact information for care management team  Reviewed scheduled/upcoming provider appointments including: will see the pcp in the next few weeks, knows to call for changes   Patient Goals/Self-Care Activities  Over the next 120 days, patient will:  - Patient will self administer medications as prescribed Patient will attend all scheduled provider appointments Patient will call pharmacy for medication refills Patient will continue to perform ADL's independently Patient will continue to perform IADL's independently Patient will call provider office for new concerns or questions Patient will work with BSW to address care coordination needs and will continue to work with the clinical team to address health care and disease management related needs.    - anxiety screen reviewed - depression screen reviewed - medication list reviewed - participation in psychiatric services encouraged - substance use assessed - substance use risk screen reviewed Follow Up Plan: Telephone follow up appointment with care management team member scheduled for: 08-16-2020 at 0900       Task: RNCM: Identify Depressive Symptoms and Facilitate Treatment   Note:   Care Management Activities:    - anxiety screen reviewed - depression  screen reviewed - medication list reviewed - participation in psychiatric services encouraged - substance use assessed - substance use risk screen reviewed         Plan:Telephone follow up appointment with care management team member scheduled for:  08-16-2020 at 0900 am   [place], MSN, [facility] Mobile: [phone]

## 2020-08-08 NOTE — Patient Instructions (Signed)
Visit Information  Patient Care Plan: General Social Work (Adult)    Problem Identified: Coping Skills (General Plan of Care)     Goal: Coping Skills Enhanced   Start Date: 07/31/2020  Priority: Medium  Note:   Evidence-based guidance:   Acknowledge, normalize and validate difficulty of making life-long lifestyle changes.   Identify current effective and ineffective coping strategies.   Encourage patient and caregiver participation in care to increase self-esteem, confidence and feelings of control.   Consider alternative and complementary therapy approaches such as meditation, mindfulness or yoga.   Encourage participation in cognitive behavioral therapy to foster a positive identity, increase self-awareness, as well as bolster self-esteem, confidence and self-efficacy.   Discuss spirituality; be present as concerns are identified; encourage journaling, prayer, worship services, meditation or pastoral counseling.   Encourage participation in pleasurable group activities such as hobbies, singing, sports or volunteering).   Encourage the use of mindfulness; refer for training or intensive intervention.   Consider the use of meditative movement therapy such as tai chi, yoga or qigong.   Promote a regular daily exercise program based on tolerance, ability and patient choice to support positive thinking about disease or aging.   Notes:   Timeframe:  Long-Range Goal Priority:  Medium Start Date: 07/31/20                             Expected End Date: 09/28/20                    Follow Up Date -90 days from 07/31/20   - check out options for in-home help, long-term care or hospice - complete a living will - discuss my treatment options with the doctor or nurse - do one enjoyable thing every day - do something different, like talking to a new person or going to a new place, every day - learn something new by asking, reading and searching the Internet every day - make an audio or  video recording for my loved ones - make shared treatment decisions with doctor - meditate daily - name a health care proxy (decision maker) - share memories using a picture album or scrapbook with my loved ones - spend time with a child every day, borrow one if I have to - spend time outdoors at least 3 times a week - strengthen or fix relationships with loved ones    Why is this important?    Having a long-term illness can be scary.   It can also be stressful for you and your caregiver.   These steps may help.    Assessment, press & current barriers:  Patient unable to consistently perform activities of daily living and needs additional assistance and support in order to meet this unmet need . Limited social support, Level of care concerns, ADL IADL limitations, Social Isolation, Limited access to caregiver, Cognitive Deficits, Memory Deficits, and Blind . Lacks social connections  Clinical Goals:   Over the next 120 days, patient will have additional in home support education provided to both patient and daughter (caregiver) in order to increase support and safety within the home Over the next 120 days, patient/caregiver will work with SW to address concerns related to care coordination needs, lack of a stable support network and lack of Brewing technologist. LCSW will assist patient in gaining additional support in order to maintain health and mental health appropriately  Over the next 120 days, patient  will demonstrate improved adherence to self care as evidenced by implementing healthy self-care into her daily routine such as: attending all medical appointments, deep breathing exercises, taking time for self-reflection, taking medications as prescribed, drinking water and daily exercise to improve mobility and mood.  Over the next 120 days, patient will demonstrate improved health management independence as evidenced by implementing healthy self-care skills and  positive support/resource implementation into their daily routine to help cope with stressors and improve overall health and well-being  Over the next 120 days, patient or caregiver will verbalize basic understanding of depression/stress process and self health management plan as evidenced by her participation in development of long term plan of care and institution of self health management strategies  Interventions : . Assessed needs, level of care concerns, basic eligibility and provided education on in home support resources . Reviewed community support options (PCS, CHORE, [facility], CAP, private pay, PACE program) . Collaborate with primary care provider per patient request. Patient wishes for PCP to know that she is battling another sinus infection.  . Provided family with community resources for the blind. DSS Social Worker of the Blind- Phone: [phone]. The [place] Division of Services for the Blind (DSB) provides services to people who are blind or visually impaired to help them reach their goals of independence and employment-[phone]. Cleora Fleet, Adaptive and Inclusive Recreation Coordinator-[phone]. These resources and other PCS resources were sent to daughter's email on 07/31/20.  . Patient reports that her memory loss has increased since her most recent stroke. She reports ongoing issues with finding her words in order to communicate. . Patient reports that her xanax medication continues to help her manage her anxiety and panic.  Marland Kitchen Patient's spouse is also in need of a caregiver as his last aide through the [place] ended her services. Family is currently working with [place] social worker to get these services started again.  . Patient has a recent neurologist appointment and will have a functional capacity test completed in [place] this week.  . Patient reports that her main socialization comes from talking over the phone to her sister, friend and cousin.  . Patient  interviewed and appropriate assessments performed . Discussed plans with patient for ongoing care management follow up and provided patient with direct contact information for care management team . Assisted patient/caregiver with obtaining information about health plan benefits . Provided education and assistance to client regarding Advanced Directives. . Provided education to patient/caregiver regarding level of care options. . Provided education to patient/caregiver about Hospice and/or Palliative Care services . Other interventions provided: Solution-Focused Strategies Patient Goals/Self-Care Activities: Over the next 120 days . Review resources provided  . Call agencies listed in email that was sent out on 07/31/20.    Task: Support Psychosocial Response to Risk or Actual Health Condition   Note:   Care Management Activities:    - active listening utilized - counseling provided - current coping strategies identified - decision-making supported - healthy lifestyle promoted - journaling promoted - meditative movement therapy encouraged - mindfulness encouraged - participation in counseling encouraged - problem-solving facilitated - relaxation techniques promoted - self-reflection promoted - spiritual activities promoted - verbalization of feelings encouraged    Notes:    Patient Care Plan: RNCM: HLD management    Problem Identified: RNCM: HLD management   Priority: Medium    Goal: RNCM: HLD Coping Skills Enhanced   Priority: Medium  Note:   Current Barriers:  . Poorly controlled hyperlipidemia, complicated by smoker, memory  changes, several chronic conditions  . Current antihyperlipidemic regimen: Is unsure about the medications, referral in for pharmacy support and pcp recommendations  . Most recent lipid panel:     Component Value Date/Time   CHOL 210 (H) 10/01/2019 1603   TRIG 172 (H) 10/01/2019 1603   HDL 47 10/01/2019 1603   CHOLHDL 5.5 07/13/2019 1615   VLDL  48 (H) 07/13/2019 1615   LDLCALC 132 (H) 10/01/2019 1603   LDLDIRECT 145.7 (H) 07/13/2019 1615 .   Marland Kitchen ASCVD risk enhancing conditions: age >22, HTN, current smoker . Unable to independently manage HLD . Unable to self administer medications as prescribed . Does not attend all scheduled provider appointments . Does not adhere to prescribed medication regimen . Lacks social connections . Unable to perform ADLs independently . Unable to perform IADLs independently . Does not maintain contact with provider office . Does not contact provider office for questions/concerns  RN Care Manager Clinical Goal(s):  Marland Kitchen Over the next 120 days, patient will work with Consulting civil engineer, providers, and care team towards execution of optimized self-health management plan . Over the next 120 days, patient will verbalize understanding of plan for effective management of HLD . Over the next 120 days, patient will work with [facility], [place] team and pcp to address needs related to HLD management   Interventions: . Collaboration with Valerie Roys, DO regarding development and update of comprehensive plan of care as evidenced by provider attestation and co-signature . Inter-disciplinary care team collaboration (see longitudinal plan of care) . Medication review performed; medication list updated in electronic medical record.  Bertram Savin care team collaboration (see longitudinal plan of care) . Referred to pharmacy team for assistance with HLD medication management . Evaluation of current treatment plan related to HLD and patient's adherence to plan as established by provider. . Advised patient to call the office for changes in conditions or questions . Provided education to patient re: working with CCM team, referrals in place, and collaboration with pcp . Discussed plans with patient for ongoing care management follow up and provided patient with direct contact information for care management  team   Patient Goals/Self-Care Activities: . Over the next 120 days, patient will:   - call for medicine refill 2 or 3 days before it runs out - call if I am sick and can't take my medicine - keep a list of all the medicines I take; vitamins and herbals too - learn to read medicine labels - use a pillbox to sort medicine - use an alarm clock or phone to remind me to take my medicine - change to whole grain breads, cereal, pasta - drink 6 to 8 glasses of water each day - eat 3 to 5 servings of fruits and vegetables each day - eat 5 or 6 small meals each day - fill half the plate with nonstarchy vegetables - limit fast food meals to no more than 1 per week - manage portion size - prepare main meal at home 3 to 5 days each week - read food labels for fat, fiber, carbohydrates and portion size - be open to making changes - I can manage, know and watch for signs of a heart attack - if I have chest pain, call for help - learn about small changes that will make a big difference - learn my personal risk factors  - active listening utilized - caregiver stress acknowledged - counseling provided - current coping strategies identified - decision-making supported -  healthy lifestyle promoted - meditative movement therapy encouraged - mindfulness encouraged - participation in counseling encouraged - problem-solving facilitated - self-reflection promoted - spiritual activities promoted - verbalization of feelings encouraged  Follow Up Plan: Telephone follow up appointment with care management team member scheduled for: 08-16-2020 at 0900     Task: RNCM: HLD-Support Psychosocial Response to Risk or Actual Health Condition   Note:   Care Management Activities:    - active listening utilized - caregiver stress acknowledged - counseling provided - current coping strategies identified - decision-making supported - healthy lifestyle promoted - meditative movement therapy encouraged -  mindfulness encouraged - participation in counseling encouraged - problem-solving facilitated - self-reflection promoted - spiritual activities promoted - verbalization of feelings encouraged       Problem Identified: RNCM: Vision Changes Quality of Life (General Plan of Care)   Priority: High    Goal: RNCM: Vision Changes Quality of Life Maintained   Priority: High  Note:   Current Barriers:  Marland Kitchen Knowledge Deficits related to vision changes due to CVA . Care Coordination needs related to resources in the community  in a patient with changes in vision and loss of vision  . Lacks caregiver support.  . Film/video editor.  . Transportation barriers . Cognitive Deficits . Unable to independently manage care due to changes in vision . Unable to self administer medications as prescribed . Does not attend all scheduled provider appointments . Does not adhere to prescribed medication regimen . Lacks social connections . Unable to perform ADLs independently . Unable to perform IADLs independently . Does not maintain contact with provider office . Does not contact provider office for questions/concerns  Nurse Case Manager Clinical Goal(s):  Marland Kitchen Over the next 120 days, patient will verbalize understanding of plan for effective management of meeting needs in patient with vision changes  . Over the next 120 days, patient will work with [facility], [place] team, and specialist  to address needs related to vision loss  . Over the next 120 days, patient will attend all scheduled medical appointments: eye specialist on 08-10-2020  Interventions:  . 1:1 collaboration with Valerie Roys, DO regarding development and update of comprehensive plan of care as evidenced by provider attestation and co-signature . Inter-disciplinary care team collaboration (see longitudinal plan of care) . Evaluation of current treatment plan related to loss of vision  and patient's adherence to plan as established by  provider. . Advised patient to keep appointment with eye doctor, call office for changes in condition  . Provided education to patient re: services for the blind and working with resources to help with patients expressed needs due to vision loss.  . Discussed plans with patient for ongoing care management follow up and provided patient with direct contact information for care management team . Reviewed scheduled/upcoming provider appointments including: eye specialist on 08-15-2020 at 0900 am   Patient Goals/Self-Care Activities Over the next 120 days, patient will:  - Patient will self administer medications as prescribed Patient will attend all scheduled provider appointments Patient will call pharmacy for medication refills Patient will continue to perform ADL's independently Patient will continue to perform IADL's independently Patient will call provider office for new concerns or questions Patient will work with BSW to address care coordination needs and will continue to work with the clinical team to address health care and disease management related needs.    - affirmation provided - community involvement promoted - expression of thoughts about present/future encouraged - independence in  all possible areas promoted - patient strengths promoted - self-expression encouraged - sleep hygiene techniques encouraged - social relationships promoted Follow Up Plan: Telephone follow up appointment with care management team member scheduled for: 08-16-2020 at 0900 am        Task: [facility]: Support and Maintain Acceptable Degree of Health, Comfort and Happiness   Note:   Care Management Activities:    - affirmation provided - community involvement promoted - expression of thoughts about present/future encouraged - independence in all possible areas promoted - patient strengths promoted - self-expression encouraged - sleep hygiene techniques encouraged - social relationships promoted        Patient Care Plan: RNCM: Stroke (Adult)    Problem Identified: RNCM: Emotional Adjustment to Disease (Stroke)   Priority: High    Goal: RNCM: Effective management of stroke   Priority: High  Note:   Current Barriers:  Marland Kitchen Knowledge Deficits related to effective management of s/p stroke and TIas . 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 Chronic Disease Management support and education needs related to management of stroke and prevention of new HA and/orstroke  . Lacks caregiver support.  . Film/video editor.  . Transportation barriers . Cognitive Deficits . Unable to independently manage care due to "mini-strokes" and TIAs . Unable to self administer medications as prescribed . Lacks social connections . Unable to perform ADLs independently . Unable to perform IADLs independently . Does not contact provider office for questions/concerns  Nurse Case Manager Clinical Goal(s):  Marland Kitchen Over the next 120 days, patient will verbalize understanding of plan for effective management of CVA and residual effects from TIAs  . Over the next 120 days, patient will work with [facility], CCM team and pcp to address needs related to **care of patient in the home with post residual deficits and decline in condition . Over the next 120 days, patient will demonstrate a decrease in TIA episodes  exacerbations as evidenced by no new CVA events  . Over the next 120 days, patient will work with CM team pharmacist to reconcile medications, polypharmacy and dubplications  . Over the next 120 days, patient will work with care guides  (community agency) to help with expressed needs: transportation, food resources, and community resources   Interventions:  . 1:1 collaboration with Valerie Roys, DO regarding development and update of comprehensive plan of care as evidenced by provider  attestation and co-signature . Inter-disciplinary care team collaboration (see longitudinal plan of care) . Provided education to patient re: talking to care guides about resources, calling insurance to inquire about life alert, working with CCM team to meet expressed needs  . Reviewed medications with patient and discussed compliance and pharmacy referral  . Collaborated with pcp and pharmacy  regarding medication question and concerns . Discussed plans with patient for ongoing care management follow up and provided patient with direct contact information for care management team . Care Guide referral for food, transportation, and community resources  . Social Work referral for needs in the home, placement questions- currently working with LCSW . Pharmacy referral for polypharmacy, medication reconciliation, duplicate medications   Patient Goals/Self-Care Activities Over the next 120 days, patient will:  - Patient will self administer medications as prescribed Patient will attend all scheduled provider appointments Patient will call pharmacy for medication refills Patient will continue to perform ADL's independently Patient will continue to perform  IADL's independently Patient will call provider office for new concerns or questions Patient will work with BSW to address care coordination needs and will continue to work with the clinical team to address health care and disease management related needs.   - caregiver stress acknowledged - caregiver support provided - counseling provided - decision-making supported - depression screen reviewed - family care conference arranged - financial counseling provided - goal-setting facilitated - positive reinforcement provided - problem-solving facilitated - self-care encouraged - self-reflection promoted - verbalization of feelings encouraged   Follow Up Plan: Telephone follow up appointment with care management team member scheduled for:  08-16-2020 at 0900 am       Task: RNCM: Support Psychosocial Response to Stroke   Note:   Care Management Activities:    - caregiver stress acknowledged - caregiver support provided - counseling provided - decision-making supported - depression screen reviewed - family care conference arranged - financial counseling provided - goal-setting facilitated - positive reinforcement provided - problem-solving facilitated - self-care encouraged - self-reflection promoted - verbalization of feelings encouraged       Patient Care Plan: RNCM: Hypertension (Adult)    Problem Identified: RNCM: Hypertension (Hypertension)   Priority: Medium    Goal: RNCM: Hypertension Monitored   Priority: Medium  Note:   Objective:  . Last practice recorded BP readings:  BP Readings from Last 3 Encounters:  08/04/20 135/65  05/01/20 (!) 150/90  12/14/19 (!) 149/84 .   Marland Kitchen Most recent eGFR/CrCl: No results found for: EGFR  No components found for: CRCL Current Barriers:  Marland Kitchen Knowledge Deficits related to basic understanding of hypertension pathophysiology and self care management . Knowledge Deficits related to understanding of medications prescribed for management of hypertension . Non-adherence to prescribed medication regimen . Non-adherence to scheduled provider appointments . Transportation barriers . Cognitive Deficits . Limited Social Support . Film/video editor.  . Unable to independently manage HTN . Unable to self administer medications as prescribed . Does not attend all scheduled provider appointments . Does not adhere to prescribed medication regimen . Lacks social connections . Unable to perform ADLs independently . Unable to perform IADLs independently . Does not maintain contact with provider office . Does not contact provider office for questions/concerns Case Manager Clinical Goal(s):  Marland Kitchen Over the next 120 days, patient will verbalize understanding of plan for  hypertension management . Over the next 120 days, patient will attend all scheduled medical appointments: will see pcp in the next few weeks. Knows to call for an appointment  . Over the next 120 days, patient will demonstrate improved adherence to prescribed treatment plan for hypertension as evidenced by taking all medications as prescribed, monitoring and recording blood pressure as directed, adhering to low sodium/DASH diet . Over the next 120 days, patient will demonstrate improved health management independence as evidenced by checking blood pressure as directed and notifying PCP if SBP>160 or DBP > 90, taking all medications as prescribe, and adhering to a low sodium diet as discussed. Interventions:  . Collaboration with Valerie Roys, DO regarding development and update of comprehensive plan of care as evidenced by provider attestation and co-signature . Inter-disciplinary care team collaboration (see longitudinal plan of care) . UNABLE to independently:manage HTN . Evaluation of current treatment plan related to hypertension self management and patient's adherence to plan as established by provider. . Provided education to patient re: stroke prevention, s/s of heart attack and stroke, DASH diet, complications of uncontrolled blood pressure . Reviewed medications with patient  and discussed importance of compliance . Discussed plans with patient for ongoing care management follow up and provided patient with direct contact information for care management team . Advised patient, providing education and rationale, to monitor blood pressure daily and record, calling PCP for findings outside established parameters.  Patient Goals/Self-Care Activities . Over the next 120 days, patient will:  - UNABLE to independently manage HTN Self administers medications as prescribed Attends all scheduled provider appointments Calls provider office for new concerns, questions, or BP outside discussed  parameters Checks BP and records as discussed Follows a low sodium diet/DASH diet - blood pressure trends reviewed - depression screen reviewed - home or ambulatory blood pressure monitoring encouraged Follow Up Plan: Telephone follow up appointment with care management team member scheduled for: 08-16-2020 at 900 am   Task: RNCM: Identify and Monitor Blood Pressure Elevation   Note:   Care Management Activities:    - blood pressure trends reviewed - depression screen reviewed - home or ambulatory blood pressure monitoring encouraged    Notes: Does not have a blood pressure cuff. Checking into OTC benefit with insurance company to obtain a blood pressure cuff.    Patient Care Plan: RNCM: Depression (Adult)    Problem Identified: RNCM: Depression Identification (Depression)   Priority: Medium    Goal: RNCM: Depressive Symptoms Identified   Priority: Medium  Note:   Current Barriers:  Marland Kitchen Knowledge Deficits related to depression and anxiety  . Chronic Disease Management support and education needs related to depression and anxiety  . Lacks caregiver support.  . Film/video editor.  . Transportation barriers . Non-adherence to scheduled provider appointments . Non-adherence to prescribed medication regimen . Unable to independently manage depression and anxiety  . Unable to self administer medications as prescribed . Does not attend all scheduled provider appointments . Does not adhere to prescribed medication regimen . Lacks social connections . Unable to perform ADLs independently . Unable to perform IADLs independently . Does not maintain contact with provider office . Does not contact provider office for questions/concerns  Nurse Case Manager Clinical Goal(s):  Marland Kitchen Over the next 120 days, patient will verbalize understanding of plan for management of depression and anxiety . Over the next 120 days, patient will work with [place], [place], and pcp to address needs related to  depression and anxiety . Over the next 120 days, patient will attend all scheduled medical appointments: will call for appointment with pcp  Interventions:  . 1:1 collaboration with Valerie Roys, DO regarding development and update of comprehensive plan of care as evidenced by provider attestation and co-signature . Inter-disciplinary care team collaboration (see longitudinal plan of care) . Evaluation of current treatment plan related to anxiety and depression  and patient's adherence to plan as established by provider. . Provided education to patient and/or caregiver about advanced directives . Provided education to patient re: calling the office for changes in mood/anxiety/depression . Reviewed medications with patient and discussed compliance, poly pharmacy, and duplications- pharmacy referral  . Collaborated with RNCM, pcp, and CCM team regarding patients expressed needs  . Discussed plans with patient for ongoing care management follow up and provided patient with direct contact information for care management team . Reviewed scheduled/upcoming provider appointments including: will see the pcp in the next few weeks, knows to call for changes   Patient Goals/Self-Care Activities Over the next 120 days, patient will:  - Patient will self administer medications as prescribed Patient will attend all scheduled provider appointments Patient will  call pharmacy for medication refills Patient will continue to perform ADL's independently Patient will continue to perform IADL's independently Patient will call provider office for new concerns or questions Patient will work with BSW to address care coordination needs and will continue to work with the clinical team to address health care and disease management related needs.    - anxiety screen reviewed - depression screen reviewed - medication list reviewed - participation in psychiatric services encouraged - substance use assessed -  substance use risk screen reviewed Follow Up Plan: Telephone follow up appointment with care management team member scheduled for: 08-16-2020 at 0900       Task: RNCM: Identify Depressive Symptoms and Facilitate Treatment   Note:   Care Management Activities:    - anxiety screen reviewed - depression screen reviewed - medication list reviewed - participation in psychiatric services encouraged - substance use assessed - substance use risk screen reviewed         Patient verbalizes understanding of instructions provided today.  Telephone follow up appointment with care management team member scheduled for: 08-16-2020 at 0900 am  Vanita Ingles

## 2020-08-09 ENCOUNTER — Ambulatory Visit: Payer: Federal, State, Local not specified - PPO | Admitting: Pharmacist

## 2020-08-09 ENCOUNTER — Ambulatory Visit: Payer: Federal, State, Local not specified - PPO | Admitting: Licensed Clinical Social Worker

## 2020-08-09 DIAGNOSIS — F419 Anxiety disorder, unspecified: Secondary | ICD-10-CM

## 2020-08-09 DIAGNOSIS — I10 Essential (primary) hypertension: Secondary | ICD-10-CM

## 2020-08-09 DIAGNOSIS — E782 Mixed hyperlipidemia: Secondary | ICD-10-CM

## 2020-08-09 DIAGNOSIS — R413 Other amnesia: Secondary | ICD-10-CM

## 2020-08-09 DIAGNOSIS — G3184 Mild cognitive impairment, so stated: Secondary | ICD-10-CM

## 2020-08-09 NOTE — Chronic Care Management (AMB) (Incomplete)
Chronic Care Management Pharmacy  Name: Tara [name]  MRN: [id number] DOB: 1945/10/26   Chief Complaint/ HPI  Tara [name],  75 y.o. , female presents for her Initial CCM visit with the clinical pharmacist via telephone.  PCP : Valerie Roys, DO Patient Care Team: Valerie Roys, DO as PCP - General (Family Medicine) Wellington Hampshire, MD as PCP - Cardiology (Cardiology) Greg Cutter, LCSW as [place] Management (Licensed Clinical Social Worker) Hall Busing, Nobie Putnam, RN as Registered Nurse ([place]) Vanita Ingles, RN as Registered Nurse (General Practice)  Patient's chronic conditions include: Hypertension, Hyperlipidemia, Coronary Artery Disease, GERD, Anxiety, Osteoarthritis and lymphedema/PAD and s/p stroke  With multiple TIAs Polyneuropathy, impaired fasting glucose, tobacco abuse/  Office Visits: 08/04/20- Dr. Wynetta Emery- Xanax refill, referral  Consult Visit: 07/25/20- Dr. Melrose Nakayama, Neuro- Lyrica 50m bid, start clonazepam--stop pain meds from [facility]- Zpak sinus infection, continue, continue asa, plavix, ophthalomogy referral visual field test 07/18/20- C. Walker , C-NP- Cards- echo shows normal funtion, moderate thickening, Aortic valve prosthesis mild -mod stenosis. Needs follow-up- Dr. AFletcher Anon Holter mon. 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 ALPRAZolam (XANAX) 0.5 MG tablet Take 1 tablet (0.5 mg total) by mouth daily as needed for anxiety.  .Marland KitchenamLODipine (NORVASC) 10 MG tablet Take 1 tablet (10 mg  total) by mouth daily.  .Marland Kitchenamoxicillin-clavulanate (AUGMENTIN) 875-125 MG tablet Take 1 tablet by mouth 2 (two) times daily.  .Marland Kitchenaspirin EC 81 MG tablet Take 1 tablet (81 mg total) by mouth daily.  . B-D TB SYRINGE 1CC/27GX1/2" 27G X 1/2" 1 ML MISC USE AS DIRECTED EVERY 30 DAYS  . citalopram (CELEXA) 40 MG tablet Take 1 tablet (40 mg total) by mouth daily.  . clopidogrel (PLAVIX) 75 MG tablet Take 1 tablet (75 mg total) by mouth daily.  . cyanocobalamin (,VITAMIN B-12,) 1000 MCG/ML injection ADMINISTER 1 ML(1000 MCG) IN THE MUSCLE EVERY 30 DAYS  . diclofenac Sodium (VOLTAREN) 1 % GEL APPLY 4 GRAMS EXTERNALLY TO THE AFFECTED AREA FOUR TIMES DAILY  . donepezil (ARICEPT) 5 MG tablet Take 5 mg by mouth daily. (Patient not taking: Reported on 08/08/2020)  . famotidine (PEPCID) 20 MG tablet TAKE 1 TABLET(20 MG) BY MOUTH AT BEDTIME  . fenofibrate micronized (LOFIBRA) 200 MG capsule TAKE 1 CAPSULE(200 MG) BY MOUTH DAILY  . gabapentin (NEURONTIN) 100 MG capsule 1 cap in AM, 1 cap in PM, 2 caps QHS (Patient not taking: Reported on 08/08/2020)  . hydrochlorothiazide (HYDRODIURIL) 25 MG tablet TAKE 1 TABLET(25 MG) BY MOUTH DAILY (Patient not taking: Reported on 08/08/2020)  . HYDROcodone-acetaminophen (NORCO/VICODIN) 5-325 MG tablet Take 1 tablet by mouth every 6 (six) hours as needed for moderate pain or severe pain.  . Insulin Syringe 27G X 1/2" 1 ML MISC 1 each by Does not apply route every 30 (thirty) days.  . isosorbide mononitrate (IMDUR) 60 MG 24 hr tablet Take 1 tablet (60 mg total) by mouth in the morning and at bedtime. (Patient not taking: Reported on 08/08/2020)  . lidocaine (LIDODERM) 5 % 2  patches daily.  Marland Kitchen lisinopril (ZESTRIL) 40 MG tablet TAKE 1 TABLET(40 MG) BY MOUTH TWICE DAILY  . loperamide (IMODIUM) 2 MG capsule Take 2 mg by mouth as needed for diarrhea or loose stools.  . nitroGLYCERIN (NITROSTAT) 0.4 MG SL tablet Place 1 tablet (0.4 mg total) under the tongue every 5 (five) minutes as needed  for chest pain.  . nortriptyline (PAMELOR) 50 MG capsule Take 1 capsule (50 mg total) by mouth at bedtime.  Marland Kitchen omeprazole (PRILOSEC) 40 MG capsule TAKE 1 CAPSULE BY MOUTH TWICE DAILY  . ondansetron (ZOFRAN) 4 MG tablet Take 1 tablet (4 mg total) by mouth every 6 (six) hours as needed for nausea or vomiting.  Marland Kitchen oxyCODONE-acetaminophen (PERCOCET/ROXICET) 5-325 MG tablet Take 1 tablet by mouth every 6 (six) hours as needed.  . pregabalin (LYRICA) 75 MG capsule 1 cap qHS for 1 week, then increase to 1 cap BID for 1 week, then 1 cap qAM and 2 caps qPM for 1 week, then 2 caps BID until you see me  . rosuvastatin (CRESTOR) 40 MG tablet Take 1 tablet (40 mg total) by mouth daily. (Patient not taking: Reported on 08/08/2020)  . traZODone (DESYREL) 50 MG tablet Take 50 mg by mouth at bedtime.  . triamcinolone ointment (KENALOG) 0.5 % Apply 1 application topically 2 (two) times daily.   Facility-Administered Encounter Medications as of 08/09/2020  Medication  . cyanocobalamin ((VITAMIN B-12)) injection 1,000 mcg    Wt Readings from Last 3 Encounters:  08/04/20 185 lb (83.9 kg)  05/01/20 185 lb 6 oz (84.1 kg)  12/14/19 180 lb 6.4 oz (81.8 kg)    Lab Results  Component Value Date   CREATININE 0.78 11/12/2019   BUN 24 11/12/2019   GFRNONAA 77 11/12/2019   GFRAA 88 11/12/2019   NA 143 11/12/2019   K 4.3 11/12/2019   CALCIUM 9.4 11/12/2019   CO2 26 11/12/2019     Current Diagnosis/Assessment:    Goals Addressed   None     Hyperlipidemia/ H/O CVA/ CAD   LDL goal < 70  Last lipids Lab Results  Component Value Date   CHOL 210 (H) 10/01/2019   HDL 47 10/01/2019   LDLCALC 132 (H) 10/01/2019   LDLDIRECT 145.7 (H) 07/13/2019   TRIG 172 (H) 10/01/2019   CHOLHDL 5.5 07/13/2019   Hepatic Function Latest Ref Rng & Units 10/01/2019 07/05/2019 02/05/2019  Total Protein 6.0 - 8.5 g/dL 6.9 6.1 6.5  Albumin 3.7 - 4.7 g/dL 4.2 4.0 4.4  AST 0 - 40 IU/L '17 20 20  ' ALT 0 - 32 IU/L '9 22 19  ' Alk  Phosphatase 39 - 117 IU/L 117 111 119(H)  Total Bilirubin 0.0 - 1.2 mg/dL 0.3 0.3 0.6     The ASCVD Risk score Mikey Bussing DC Jr., et al., 2013) failed to calculate for the following reasons:   The patient has a prior MI or stroke diagnosis   Patient has failed these meds in past: *** Patient is currently {CHL Controlled/Uncontrolled:[phone]} on the following medications:  . Fenofibrate 200 mg  . Aspirin ec 81 mg . Plavix 75 mg . NTG prn . ? Atorvastatin 76m qd vs Rosuvastatin 40 mg?  We discussed:  {CHL HP Upstream Pharmacy [phone]}  Plan  Continue {CHL HP Upstream Pharmacy Plans:[phone]}  Hypertension/ H/O CVA/ Aortic bioprosthetic valve replacement   BP goal is:  <130/80  Office blood pressures are  BP Readings from Last 3 Encounters:  08/04/20 135/65  05/01/20 (!) 150/90  12/14/19 (Marland Kitchen  149/84   Patient checks BP at home {CHL HP BP Monitoring [phone]} Patient home BP readings are ranging: ***  Patient has failed these meds in the past: *** Patient is currently {CHL Controlled/Uncontrolled:[phone]} on the following medications:  . Amlodipine 10 mg qd . HCTZ 25 mg qd . Lisinopril 40 mg bod . Nitroglycerin  . Imdur 60 mg bid?  We discussed Stent in Fl in 2008.Valve 2016. Rt fem to pop 08/02/19.  Plan  Continue {CHL HP Upstream Pharmacy [phone]}   Polyneuropathy   Patient has failed these meds in past: *** Patient is currently {CHL Controlled/Uncontrolled:[phone]} on the following medications:  . Nortriptyline 50 mg  . Pregabalin 75 mg  We discussed:  ***  Plan  Continue {CHL HP Upstream Pharmacy [phone]}    / Anxiety   PHQ9 Score:  PHQ9 SCORE ONLY 08/08/2020 08/04/2020 04/22/2019  PHQ-9 Total Score '12 4 16   ' GAD7 Score: GAD 7 : Generalized Anxiety Score 08/04/2020 04/22/2019 02/05/2019  Nervous, Anxious, on Edge '1 3 3  ' Control/stop worrying 0 3 3  Worry too much - different things 0 3 3  Trouble  relaxing 0 3 3  Restless 0 2 2  Easily annoyed or irritable '1 3 3  ' Afraid - awful might happen 0 3 1  Total GAD 7 Score '2 20 18  ' Anxiety Difficulty Somewhat difficult Very difficult Very difficult    Patient has failed these meds in past: clonazepam  Patient is currently controlled on the following medications:  . Alprazolam 0.5 mg daly prn   We discussed:  Clonazepam started by neuro for tremors. Patient could not tolerate due to altered ment  Plan  Continue {CHL HP Upstream Pharmacy NPHQN:[id number]}  Tobacco Abuse     Plan  Will discuss at follow - up appointment   Medication Management   Patient's preferred pharmacy is:  [place], [place] - [address] [place] [address] Phone: [phone] Fax: [phone]  Uses pill box? {Yes or If no, why not?:20788} Pt endorses ***% compliance  We discussed: {Pharmacy options:24294}  Plan  {US Pharmacy JNWM:68403}    Follow up: *** month phone visit  ***

## 2020-08-09 NOTE — Chronic Care Management (AMB) (Signed)
Chronic Care Management    Clinical Social Work Note  08/09/2020 Name: Kinberly Perris Santelli MRN: [id number] DOB: Apr 24, 1946  Aleatha Borer [place] is a 75 y.o. year old female who is a primary care patient of Valerie Roys, DO. The CCM team was consulted to assist the patient with chronic disease management and/or care coordination needs related to: Mental Health Counseling and Resources.   Engaged with patient by telephone for follow up visit in response to provider referral for social work chronic care management and care coordination services.   Consent to Services:  The patient was given the following information about Chronic Care Management services today, agreed to services, and gave verbal consent: 1. CCM service includes personalized support from designated clinical staff supervised by the primary care provider, including individualized plan of care and coordination with other care providers 2. 24/7 contact phone numbers for assistance for urgent and routine care needs. 3. Service will only be billed when office clinical staff spend 20 minutes or more in a month to coordinate care. 4. Only one practitioner may furnish and bill the service in a calendar month. 5.The patient may stop CCM services at any time (effective at the end of the month) by phone call to the office staff. 6. The patient will be responsible for cost sharing (co-pay) of up to 20% of the service fee (after annual deductible is met). Patient agreed to services and consent obtained.  Patient agreed to services and consent obtained.   Assessment: Review of patient past medical history, allergies, medications, and health status, including review of relevant consultants reports was performed today as part of a comprehensive evaluation and provision of chronic care management and care coordination services.     SDOH (Social Determinants of Health) assessments and interventions performed:    Advanced Directives Status: See  Care Plan for related entries.  CCM Care Plan  No Known Allergies  Outpatient Encounter Medications as of 08/09/2020  Medication Sig  . ALPRAZolam (XANAX) 0.5 MG tablet Take 1 tablet (0.5 mg total) by mouth daily as needed for anxiety.  Marland Kitchen amLODipine (NORVASC) 10 MG tablet Take 1 tablet (10 mg total) by mouth daily.  Marland Kitchen amoxicillin-clavulanate (AUGMENTIN) 875-125 MG tablet Take 1 tablet by mouth 2 (two) times daily.  Marland Kitchen aspirin EC 81 MG tablet Take 1 tablet (81 mg total) by mouth daily.  . B-D TB SYRINGE 1CC/27GX1/2" 27G X 1/2" 1 ML MISC USE AS DIRECTED EVERY 30 DAYS  . citalopram (CELEXA) 40 MG tablet Take 1 tablet (40 mg total) by mouth daily.  . clopidogrel (PLAVIX) 75 MG tablet Take 1 tablet (75 mg total) by mouth daily.  . cyanocobalamin (,VITAMIN B-12,) 1000 MCG/ML injection ADMINISTER 1 ML(1000 MCG) IN THE MUSCLE EVERY 30 DAYS  . diclofenac Sodium (VOLTAREN) 1 % GEL APPLY 4 GRAMS EXTERNALLY TO THE AFFECTED AREA FOUR TIMES DAILY  . donepezil (ARICEPT) 5 MG tablet Take 5 mg by mouth daily. (Patient not taking: Reported on 08/08/2020)  . famotidine (PEPCID) 20 MG tablet TAKE 1 TABLET(20 MG) BY MOUTH AT BEDTIME  . fenofibrate micronized (LOFIBRA) 200 MG capsule TAKE 1 CAPSULE(200 MG) BY MOUTH DAILY  . gabapentin (NEURONTIN) 100 MG capsule 1 cap in AM, 1 cap in PM, 2 caps QHS (Patient not taking: Reported on 08/08/2020)  . hydrochlorothiazide (HYDRODIURIL) 25 MG tablet TAKE 1 TABLET(25 MG) BY MOUTH DAILY (Patient not taking: Reported on 08/08/2020)  . HYDROcodone-acetaminophen (NORCO/VICODIN) 5-325 MG tablet Take 1 tablet by mouth every 6 (six)  hours as needed for moderate pain or severe pain.  . Insulin Syringe 27G X 1/2" 1 ML MISC 1 each by Does not apply route every 30 (thirty) days.  . isosorbide mononitrate (IMDUR) 60 MG 24 hr tablet Take 1 tablet (60 mg total) by mouth in the morning and at bedtime. (Patient not taking: Reported on 08/08/2020)  . lidocaine (LIDODERM) 5 % 2 patches daily.  Marland Kitchen  lisinopril (ZESTRIL) 40 MG tablet TAKE 1 TABLET(40 MG) BY MOUTH TWICE DAILY  . loperamide (IMODIUM) 2 MG capsule Take 2 mg by mouth as needed for diarrhea or loose stools.  . nitroGLYCERIN (NITROSTAT) 0.4 MG SL tablet Place 1 tablet (0.4 mg total) under the tongue every 5 (five) minutes as needed for chest pain.  . nortriptyline (PAMELOR) 50 MG capsule Take 1 capsule (50 mg total) by mouth at bedtime.  Marland Kitchen omeprazole (PRILOSEC) 40 MG capsule TAKE 1 CAPSULE BY MOUTH TWICE DAILY  . ondansetron (ZOFRAN) 4 MG tablet Take 1 tablet (4 mg total) by mouth every 6 (six) hours as needed for nausea or vomiting.  Marland Kitchen oxyCODONE-acetaminophen (PERCOCET/ROXICET) 5-325 MG tablet Take 1 tablet by mouth every 6 (six) hours as needed.  . pregabalin (LYRICA) 75 MG capsule 1 cap qHS for 1 week, then increase to 1 cap BID for 1 week, then 1 cap qAM and 2 caps qPM for 1 week, then 2 caps BID until you see me  . rosuvastatin (CRESTOR) 40 MG tablet Take 1 tablet (40 mg total) by mouth daily. (Patient not taking: Reported on 08/08/2020)  . traZODone (DESYREL) 50 MG tablet Take 50 mg by mouth at bedtime.  . triamcinolone ointment (KENALOG) 0.5 % Apply 1 application topically 2 (two) times daily.   Facility-Administered Encounter Medications as of 08/09/2020  Medication  . cyanocobalamin ((VITAMIN B-12)) injection 1,000 mcg    Patient Active Problem List   Diagnosis Date Noted  . 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 S/P stroke due to cerebrovascular disease 08/06/2020  . Polyneuropathy, unspecified 12/08/2019  . Difficulty sleeping 10/29/2019  . Mild cognitive impairment 10/29/2019  . Right leg numbness 10/29/2019  . Lymphedema 10/26/2019  . Atherosclerosis of native arteries of extremity with intermittent claudication ([place]) 10/26/2019  . Nerve pain 10/12/2019  . Memory loss 10/12/2019  . PAD (peripheral artery disease) ([place]) 08/02/2019  . Temporal arteritis ([place])   . Other  intervertebral disc degeneration, thoracic region 06/21/2019  . GERD (gastroesophageal reflux disease) 06/21/2019  . Angiodysplasia of intestinal tract   . Intestinal bypass or anastomosis status   . History of aortic valve replacement 02/07/2019  . B12 deficiency 02/07/2019  . Anxiety 02/05/2019  . HTN (hypertension) 02/05/2019  . Hyperlipidemia 02/05/2019  . IBS (irritable bowel syndrome) 02/05/2019  . Arthritis of both knees 02/05/2019  . CTS (carpal tunnel syndrome) 02/05/2019    Conditions to be addressed/monitored: Anxiety and Dementia; Limited social support, ADL IADL limitations, Mental Health Concerns , Social Isolation and Limited access to caregiver  Care Plan : General Social Work (Adult)  Updates made by Greg Cutter, LCSW since 08/09/2020 12:00 AM    Problem: Coping Skills (General Plan of Care)     Goal: Coping Skills Enhanced   Start Date: 07/31/2020  Priority: Medium  Note:   Evidence-based guidance:   Acknowledge, normalize and validate difficulty of making life-long lifestyle changes.   Identify current effective and ineffective coping strategies.   Encourage patient and caregiver participation in care to increase self-esteem,  confidence and feelings of control.   Consider alternative and complementary therapy approaches such as meditation, mindfulness or yoga.   Encourage participation in cognitive behavioral therapy to foster a positive identity, increase self-awareness, as well as bolster self-esteem, confidence and self-efficacy.   Discuss spirituality; be present as concerns are identified; encourage journaling, prayer, worship services, meditation or pastoral counseling.   Encourage participation in pleasurable group activities such as hobbies, singing, sports or volunteering).   Encourage the use of mindfulness; refer for training or intensive intervention.   Consider the use of meditative movement therapy such as tai chi, yoga or qigong.    Promote a regular daily exercise program based on tolerance, ability and patient choice to support positive thinking about disease or aging.   Notes:   Timeframe:  Long-Range Goal Priority:  Medium Start Date: 07/31/20 , adjusted on 08/09/20                          Expected End Date: 09/28/20                    Follow Up Date -90 days from 08/09/20   - check out options for in-home help, long-term care or hospice - complete a living will - discuss my treatment options with the doctor or nurse - do one enjoyable thing every day - do something different, like talking to a new person or going to a new place, every day - learn something new by asking, reading and searching the Internet every day - make an audio or video recording for my loved ones - make shared treatment decisions with doctor - meditate daily - name a health care proxy (decision maker) - share memories using a picture album or scrapbook with my loved ones - spend time with a child every day, borrow one if I have to - spend time outdoors at least 3 times a week - strengthen or fix relationships with loved ones    Why is this important?    Having a long-term illness can be scary.   It can also be stressful for you and your caregiver.   These steps may help.    Assessment, press & current barriers:  Patient unable to consistently perform activities of daily living and needs additional assistance and support in order to meet this unmet need . Limited social support, Level of care concerns, ADL IADL limitations, Social Isolation, Limited access to caregiver, Cognitive Deficits, Memory Deficits, and Blind . Lacks social connections  Clinical Goals:   Over the next 120 days, patient will have additional in home support education provided to both patient and daughter (caregiver) in order to increase support and safety within the home Over the next 120 days, patient/caregiver will work with SW to address concerns related  to care coordination needs, lack of a stable support network and lack of Brewing technologist. LCSW will assist patient in gaining additional support in order to maintain health and mental health appropriately  Over the next 120 days, patient will demonstrate improved adherence to self care as evidenced by implementing healthy self-care into her daily routine such as: attending all medical appointments, deep breathing exercises, taking time for self-reflection, taking medications as prescribed, drinking water and daily exercise to improve mobility and mood.  Over the next 120 days, patient will demonstrate improved health management independence as evidenced by implementing healthy self-care skills and positive support/resource implementation into their daily routine to help  cope with stressors and improve overall health and well-being  Over the next 120 days, patient or caregiver will verbalize basic understanding of depression/stress process and self health management plan as evidenced by her participation in development of long term plan of care and institution of self health management strategies  Interventions : . Assessed needs, level of care concerns, basic eligibility and provided education on in home support resources . UPDATE 08/09/20- Family is very concerned about patient's current medications. [name] (daughter) is wanting direction from PCP on how to safely give patient her current medications. She is very concerned about over medicating patient since they changed pharmacies. Patient has a pain management appointment today at 12:40 pm and they are asking for pharmacist to call them after 2 if that works. Pharmacist is agreeable to reschedule time of her CCM Pharmacist appointment for today. They are wondering if pharmacist could possibly communicate with Dr. Wynetta Emery regarding these medication concerns if they cannot speak directly to PCP themselves. CCM LCSW updated CCM RNCM and  PCP on this concern as well. CCM LCSW provided emotional support to caregiver today. CCM LCSW also provided education on [employer] and their caregiver support programs. CCM LCSW encouraged family to contact program about their caregiver support programs ASAP. Marland Kitchen Reviewed community support options (PCS, CHORE, [facility], CAP, private pay, PACE program) . Collaborate with primary care provider per patient request. Patient wishes for PCP to know that she is battling another sinus infection.  . Provided family with community resources for the blind. DSS Social Worker of the Blind- Phone: [phone]. The [place] [employer] of Services for the Blind (DSB) provides services to people who are blind or visually impaired to help them reach their goals of independence and [phone]. Cleora Fleet, Adaptive and Inclusive Recreation Coordinator-[phone]. These resources and other PCS resources were sent to daughter's email on 07/31/20.  . Patient reports that her memory loss has increased since her most recent stroke. She reports ongoing issues with finding her words in order to communicate. . Patient reports that her xanax medication continues to help her manage her anxiety and panic.  Marland Kitchen Patient's spouse is also in need of a caregiver as his last aide through the [place] ended her services. Family is currently working with [place] social worker to get these services started again.  . Patient has a recent neurologist appointment and will have a functional capacity test completed in [place] this week.  . Patient reports that her main socialization comes from talking over the phone to her sister, friend and cousin.  . Patient interviewed and appropriate assessments performed . Discussed plans with patient for ongoing care management follow up and provided patient with direct contact information for care management team . Assisted patient/caregiver with obtaining information about health plan  benefits . Provided education and assistance to client regarding Advanced Directives. . Provided education to patient/caregiver regarding level of care options. . Provided education to patient/caregiver about Hospice and/or Palliative Care services . Other interventions provided: Solution-Focused Strategies Patient Goals/Self-Care Activities: Over the next 120 days . Review resources provided  . Call agencies listed in email that was sent out on 07/31/20.       Follow Up Plan: SW will follow up with patient by phone over the next quarter      Eula Fried, BSW, MSW, [email or web address] Phone: [phone]

## 2020-08-11 ENCOUNTER — Telehealth: Payer: Self-pay

## 2020-08-11 ENCOUNTER — Telehealth: Payer: Federal, State, Local not specified - PPO

## 2020-08-11 NOTE — Telephone Encounter (Signed)
° °  Telephone encounter was:  Successful.  08/11/2020 Name: Khristin Keleher Marcussen MRN: [id number] DOB: [birth year]  Pierre Bali Preziosi is a 75 y.o. year old female who is a primary care patient of Dorcas Carrow, DO . The community resource team was consulted for assistance with Transportation Needs , Food Insecurity and resources for the blind  Care guide performed the following interventions: Patient provided with information about care guide support team and interviewed to confirm resource needs Discussed resources to assist with meal delivery, transportation, resources for the blind. MOW will start on Monday, August 14, 2020.  Emailed resources to patient's daughter [name] per request [email or web address].  Follow Up Plan:  Care guide will follow up with patient by phone over the next 7 days  Tyshan Enderle, AAS Paralegal, [facility] Care Guide  Embedded Care Coordination [facility]   Care Management  [address] [place], [place] [address] ??[email or web address]   ?? [phone]   [email or web address]

## 2020-08-15 ENCOUNTER — Telehealth: Payer: Self-pay | Admitting: Family Medicine

## 2020-08-15 NOTE — Telephone Encounter (Signed)
Routing to provider for orders.  °

## 2020-08-15 NOTE — Telephone Encounter (Signed)
OK for verbal orders?

## 2020-08-15 NOTE — Telephone Encounter (Signed)
Tara [name] with [facility] requesting verbal PT orders  2 wk 3 1 wk 2  Also recommend OT evaluation  cb  [phone] Secure VM

## 2020-08-16 ENCOUNTER — Ambulatory Visit: Payer: Self-pay | Admitting: General Practice

## 2020-08-16 ENCOUNTER — Telehealth: Payer: Federal, State, Local not specified - PPO | Admitting: General Practice

## 2020-08-16 DIAGNOSIS — I10 Essential (primary) hypertension: Secondary | ICD-10-CM

## 2020-08-16 DIAGNOSIS — F419 Anxiety disorder, unspecified: Secondary | ICD-10-CM

## 2020-08-16 DIAGNOSIS — E782 Mixed hyperlipidemia: Secondary | ICD-10-CM

## 2020-08-16 DIAGNOSIS — R413 Other amnesia: Secondary | ICD-10-CM

## 2020-08-16 DIAGNOSIS — I69312 Visuospatial deficit and spatial neglect following cerebral infarction: Secondary | ICD-10-CM

## 2020-08-16 NOTE — Chronic Care Management (AMB) (Signed)
Chronic Care Management   CCM RN Visit Note  08/16/2020 Name: Tara [name] MRN: [id number] DOB: 1946/05/21  Subjective: Tara [name] is a 75 y.o. year old female who is a primary care patient of Valerie Roys, DO. The care management team was consulted for assistance with disease management and care coordination needs.    Engaged with patient by telephone for follow up visit in response to provider referral for case management and/or care coordination services.   Consent to Services:  The patient was given information about Chronic Care Management services, agreed to services, and gave verbal consent prior to initiation of services.  Please see initial visit note for detailed documentation.   Patient agreed to services and verbal consent obtained.   Assessment: Review of patient past medical history, allergies, medications, health status, including review of consultants reports, laboratory and other test data, was performed as part of comprehensive evaluation and provision of chronic care management services.   SDOH (Social Determinants of Health) assessments and interventions performed:    CCM Care Plan  No Known Allergies  Outpatient Encounter Medications as of 08/16/2020  Medication Sig Note   ALPRAZolam (XANAX) 0.5 MG tablet Take 1 tablet (0.5 mg total) by mouth daily as needed for anxiety.    amLODipine (NORVASC) 10 MG tablet Take 1 tablet (10 mg total) by mouth daily.    amoxicillin-clavulanate (AUGMENTIN) 875-125 MG tablet Take 1 tablet by mouth 2 (two) times daily.    aspirin EC 81 MG tablet Take 1 tablet (81 mg total) by mouth daily.    B-D TB SYRINGE 1CC/27GX1/2" 27G X 1/2" 1 ML MISC USE AS DIRECTED EVERY 30 DAYS    citalopram (CELEXA) 40 MG tablet Take 1 tablet (40 mg total) by mouth daily.    clopidogrel (PLAVIX) 75 MG tablet Take 1 tablet (75 mg total) by mouth daily.    cyanocobalamin (,VITAMIN B-12,) 1000 MCG/ML injection ADMINISTER 1 ML(1000  MCG) IN THE MUSCLE EVERY 30 DAYS    diclofenac Sodium (VOLTAREN) 1 % GEL APPLY 4 GRAMS EXTERNALLY TO THE AFFECTED AREA FOUR TIMES DAILY (Patient not taking: Reported on 08/09/2020)    donepezil (ARICEPT) 5 MG tablet Take 5 mg by mouth daily. (Patient not taking: No sig reported)    famotidine (PEPCID) 20 MG tablet TAKE 1 TABLET(20 MG) BY MOUTH AT BEDTIME (Patient not taking: Reported on 08/09/2020)    fenofibrate micronized (LOFIBRA) 200 MG capsule TAKE 1 CAPSULE(200 MG) BY MOUTH DAILY    hydrochlorothiazide (HYDRODIURIL) 25 MG tablet TAKE 1 TABLET(25 MG) BY MOUTH DAILY (Patient not taking: No sig reported)    Insulin Syringe 27G X 1/2" 1 ML MISC 1 each by Does not apply route every 30 (thirty) days.    isosorbide mononitrate (IMDUR) 60 MG 24 hr tablet Take 1 tablet (60 mg total) by mouth in the morning and at bedtime. (Patient taking differently: Take 60 mg by mouth in the morning and at bedtime. Filled today. Will have delivered tomorrow.)    lidocaine (LIDODERM) 5 % 2 patches daily.    lisinopril (ZESTRIL) 40 MG tablet TAKE 1 TABLET(40 MG) BY MOUTH TWICE DAILY    loperamide (IMODIUM) 2 MG capsule Take 2 mg by mouth as needed for diarrhea or loose stools.    nitroGLYCERIN (NITROSTAT) 0.4 MG SL tablet Place 1 tablet (0.4 mg total) under the tongue every 5 (five) minutes as needed for chest pain.    nortriptyline (PAMELOR) 50 MG capsule Take 1 capsule (50 mg total)  by mouth at bedtime.    omeprazole (PRILOSEC) 40 MG capsule TAKE 1 CAPSULE BY MOUTH TWICE DAILY    ondansetron (ZOFRAN) 4 MG tablet Take 1 tablet (4 mg total) by mouth every 6 (six) hours as needed for nausea or vomiting.    pregabalin (LYRICA) 75 MG capsule 1 cap qHS for 1 week, then increase to 1 cap BID for 1 week, then 1 cap qAM and 2 caps qPM for 1 week, then 2 caps BID until you see me (Patient taking differently: 75 mg 2 (two) times daily.)    rosuvastatin (CRESTOR) 40 MG tablet Take 1 tablet (40 mg total) by mouth  daily. 08/09/2020: Filled & signed out 05/22/20 90 day per [facility]. [name] will request refill as soon as insurance allopws. Unknown if patient was taking   traZODone (DESYREL) 50 MG tablet Take 50 mg by mouth at bedtime. (Patient not taking: Reported on 08/09/2020)    triamcinolone ointment (KENALOG) 0.5 % Apply 1 application topically 2 (two) times daily.    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   S/P stroke due to cerebrovascular disease 08/06/2020   Polyneuropathy, unspecified 12/08/2019   Difficulty sleeping 10/29/2019   Mild cognitive impairment 10/29/2019   Right leg numbness 10/29/2019   Lymphedema 10/26/2019   Atherosclerosis of native arteries of extremity with intermittent claudication ([place]) 10/26/2019   Nerve pain 10/12/2019   Memory loss 10/12/2019   PAD (peripheral artery disease) ([place]) 08/02/2019   Temporal arteritis (H[facility])    Other intervertebral disc degeneration, thoracic region 06/21/2019   GERD (gastroesophageal reflux disease) 06/21/2019   Angiodysplasia of intestinal tract    Intestinal bypass or anastomosis status    History of aortic valve replacement 02/07/2019   B12 deficiency 02/07/2019   Anxiety 02/05/2019   HTN (hypertension) 02/05/2019   Hyperlipidemia 02/05/2019   IBS (irritable bowel syndrome) 02/05/2019   Arthritis of both knees 02/05/2019   CTS (carpal tunnel syndrome) 02/05/2019    Conditions to be addressed/monitored:HTN, HLD, Anxiety, Depression and Vision changes- blindness, and stroke  Care Plan : RNCM: HLD management  Updates made by Vanita Ingles since 08/16/2020 12:00 AM    Problem: RNCM: HLD management   Priority: Medium    Goal: RNCM: HLD Coping Skills Enhanced   Priority: Medium   Note:   Current Barriers:   Poorly controlled hyperlipidemia, complicated by smoker, memory changes, several chronic conditions   Current antihyperlipidemic regimen: Is unsure about the medications, referral in for pharmacy support and pcp recommendations   Most recent lipid panel:     Component Value Date/Time   CHOL 210 (H) 10/01/2019 1603   TRIG 172 (H) 10/01/2019 1603   HDL 47 10/01/2019 1603   CHOLHDL 5.5 07/13/2019 1615   VLDL 48 (H) 07/13/2019 1615   LDLCALC 132 (H) 10/01/2019 1603   LDLDIRECT 145.7 (H) 07/13/2019 1615     ASCVD risk enhancing conditions: age >92, HTN, current smoker  Unable to independently manage HLD  Unable to self administer medications as prescribed  Does not attend all scheduled provider appointments  Does not adhere to prescribed medication regimen  Lacks social connections  Unable to perform ADLs independently  Unable to perform IADLs independently  Does not maintain contact with provider office  Does not contact provider office for questions/concerns  RN Care  Manager Clinical Goal(s):   Over the next 120 days, patient will work with Consulting civil engineer, providers, and care team towards execution of optimized self-health management plan  Over the next 120 days, patient will verbalize understanding of plan for effective management of HLD  Over the next 120 days, patient will work with [facility], CCM team and pcp to address needs related to HLD management   Interventions:  Collaboration with Valerie Roys, DO regarding development and update of comprehensive plan of care as evidenced by provider attestation and co-signature  Inter-disciplinary care team collaboration (see longitudinal plan of care)  Medication review performed; medication list updated in electronic medical record.   Inter-disciplinary care team collaboration (see longitudinal plan of care)  Referred to pharmacy team for assistance with HLD medication  management  Evaluation of current treatment plan related to HLD and patient's adherence to plan as established by provider.  Advised patient to call the office for changes in conditions or questions  Provided education to patient re: working with CCM team, referrals in place, and collaboration with pcp  Discussed plans with patient for ongoing care management follow up and provided patient with direct contact information for care management team  Next appointment with pcp on 08-30-2020 at 11:20 am   Patient Goals/Self-Care Activities:  Over the next 120 days, patient will:   - call for medicine refill 2 or 3 days before it runs out - call if I am sick and can't take my medicine - keep a list of all the medicines I take; vitamins and herbals too - learn to read medicine labels - use a pillbox to sort medicine - use an alarm clock or phone to remind me to take my medicine - change to whole grain breads, cereal, pasta - drink 6 to 8 glasses of water each day - eat 3 to 5 servings of fruits and vegetables each day - eat 5 or 6 small meals each day - fill half the plate with nonstarchy vegetables - limit fast food meals to no more than 1 per week - manage portion size - prepare main meal at home 3 to 5 days each week - read food labels for fat, fiber, carbohydrates and portion size - be open to making changes - I can manage, know and watch for signs of a heart attack - if I have chest pain, call for help - learn about small changes that will make a big difference - learn my personal risk factors  - active listening utilized - caregiver stress acknowledged - counseling provided - current coping strategies identified - decision-making supported - healthy lifestyle promoted - meditative movement therapy encouraged - mindfulness encouraged - participation in counseling encouraged - problem-solving facilitated - self-reflection promoted - spiritual activities promoted -  verbalization of feelings encouraged  Follow Up Plan: Telephone follow up appointment with care management team member scheduled for: 09-19-2020 at 10:30 am     Problem: RNCM: Vision Changes Quality of Life (General Plan of Care)   Priority: High    Goal: RNCM: Vision Changes Quality of Life Maintained   Priority: High  Note:   Current Barriers:   Knowledge Deficits related to vision changes due to CVA  Care Coordination needs related to resources in the community  in a patient with changes in vision and loss of vision   Lacks caregiver support.   Film/video editor.   Transportation barriers  Cognitive Deficits  Unable to independently manage care due to changes in vision  Unable to self administer medications as prescribed  Does not attend all scheduled provider appointments  Does not adhere to prescribed medication regimen  Lacks social connections  Unable to perform ADLs independently  Unable to perform IADLs independently  Does not maintain contact with provider office  Does not contact provider office for questions/concerns  Nurse Case Manager Clinical Goal(s):   Over the next 120 days, patient will verbalize understanding of plan for effective management of meeting needs in patient with vision changes   Over the next 120 days, patient will work with [facility], CCM team, and specialist  to address needs related to vision loss   Over the next 120 days, patient will attend all scheduled medical appointments: eye specialist on 08-10-2020  Interventions:   1:1 collaboration with Valerie Roys, DO regarding development and update of comprehensive plan of care as evidenced by provider attestation and co-signature  Inter-disciplinary care team collaboration (see longitudinal plan of care)  Evaluation of current treatment plan related to loss of vision  and patient's adherence to plan as established by provider. 08-16-2020: Saw eye specialist last week. Was  diagnosed with cortical blindness. The patient if not handling well, but has support from her daughter.   Advised patient to keep appointment with eye doctor, call office for changes in condition   Provided education to patient re: services for the blind and working with resources to help with patients expressed needs due to vision loss. 08-16-2020: Review of resources available for services for the blind. This is all new to the daughter and the patient. Will continue to monitor for changes and needs   Discussed plans with patient for ongoing care management follow up and provided patient with direct contact information for care management team  Reviewed scheduled/upcoming provider appointments including: eye specialist on 08-15-2020 at 0900 am. 08-16-2020: Completed this appointment and will see pcp on 08-30-2020 at 1120 am.   Patient Goals/Self-Care Activities Over the next 120 days, patient will:  - Patient will self administer medications as prescribed Patient will attend all scheduled provider appointments Patient will call pharmacy for medication refills Patient will continue to perform ADL's independently Patient will continue to perform IADL's independently Patient will call provider office for new concerns or questions Patient will work with BSW to address care coordination needs and will continue to work with the clinical team to address health care and disease management related needs.    - affirmation provided - community involvement promoted - expression of thoughts about present/future encouraged - independence in all possible areas promoted - patient strengths promoted - self-expression encouraged - sleep hygiene techniques encouraged - social relationships promoted Follow Up Plan: Telephone follow up appointment with care management team member scheduled for: 09-19-2020 at 10:30 am       Care Plan : RNCM: Stroke (Adult)  Updates made by Vanita Ingles since 08/16/2020 12:00  AM    Problem: RNCM: Emotional Adjustment to Disease (Stroke)   Priority: High    Goal: RNCM: Effective management of stroke   Priority: High  Note:   Current Barriers:   Knowledge Deficits related to effective management of s/p stroke and TIas  Care Coordination needs related to community resources and expressed needs  in a patient with past stroke and changes in condition related to TIAs and mini-strokes   Chronic Disease Management support and education needs related to management of stroke and prevention of new HA and/orstroke   Lacks caregiver support.   Film/video editor.  Transportation barriers  Cognitive Deficits  Unable to independently manage care due to "mini-strokes" and TIAs  Unable to self administer medications as prescribed  Lacks social connections  Unable to perform ADLs independently  Unable to perform IADLs independently  Does not contact provider office for questions/concerns  Nurse Case Manager Clinical Goal(s):   Over the next 120 days, patient will verbalize understanding of plan for effective management of CVA and residual effects from TIAs   Over the next 120 days, patient will work with [facility], CCM team and pcp to address needs related to **care of patient in the home with post residual deficits and decline in condition  Over the next 120 days, patient will demonstrate a decrease in TIA episodes  exacerbations as evidenced by no new CVA events   Over the next 120 days, patient will work with CM team pharmacist to reconcile medications, polypharmacy and dubplications   Over the next 120 days, patient will work with care guides  (community agency) to help with expressed needs: transportation, food resources, and community resources   Interventions:   1:1 collaboration with Valerie Roys, DO regarding development and update of comprehensive plan of care as evidenced by provider attestation and co-signature  Inter-disciplinary care  team collaboration (see longitudinal plan of care)  Provided education to patient re: talking to care guides about resources, calling insurance to inquire about life alert, working with CCM team to meet expressed needs   Reviewed medications with patient and discussed compliance and pharmacy referral. 08-16-2020: The pharmacist has reviewed with the daughter medications list and the daughter is more at ease with medications. The pharmacist will continue to work with the patients daughter   Collaborated with pcp and pharmacy  regarding medication question and concerns  Discussed plans with patient for ongoing care management follow up and provided patient with direct contact information for care management team  Care Guide referral for food, transportation, and community resources   Social Work referral for needs in the home, placement questions- currently working with [facility] referral for polypharmacy, medication reconciliation, duplicate medications   Patient Goals/Self-Care Activities Over the next 120 days, patient will:  - Patient will self administer medications as prescribed Patient will attend all scheduled provider appointments Patient will call pharmacy for medication refills Patient will continue to perform ADL's independently Patient will continue to perform IADL's independently Patient will call provider office for new concerns or questions Patient will work with BSW to address care coordination needs and will continue to work with the clinical team to address health care and disease management related needs.   - caregiver stress acknowledged - caregiver support provided - counseling provided - decision-making supported - depression screen reviewed - family care conference arranged - financial counseling provided - goal-setting facilitated - positive reinforcement provided - problem-solving facilitated - self-care encouraged - self-reflection promoted -  verbalization of feelings encouraged   Follow Up Plan: Telephone follow up appointment with care management team member scheduled for: 09-19-2020 10:30 am       Care Plan : RNCM: Hypertension (Adult)  Updates made by Vanita Ingles since 08/16/2020 12:00 AM    Problem: RNCM: Hypertension (Hypertension)   Priority: Medium    Goal: RNCM: Hypertension Monitored   Priority: Medium  Note:   Objective:   Last practice recorded BP readings:  BP Readings from Last 3 Encounters:  08/04/20 135/65  05/01/20 (!) 150/90  12/14/19 (!) 149/84     Most recent eGFR/CrCl: No results  found for: EGFR  No components found for: CRCL Current Barriers:   Knowledge Deficits related to basic understanding of hypertension pathophysiology and self care management  Knowledge Deficits related to understanding of medications prescribed for management of hypertension  Non-adherence to prescribed medication regimen  Non-adherence to scheduled provider appointments  Transportation barriers  Cognitive Deficits  Limited Social Lawyer.   Unable to independently manage HTN  Unable to self administer medications as prescribed  Does not attend all scheduled provider appointments  Does not adhere to prescribed medication regimen  Lacks social connections  Unable to perform ADLs independently  Unable to perform IADLs independently  Does not maintain contact with provider office  Does not contact provider office for questions/concerns Case Manager Clinical Goal(s):   Over the next 120 days, patient will verbalize understanding of plan for hypertension management  Over the next 120 days, patient will attend all scheduled medical appointments: will see pcp in the next few weeks. Knows to call for an appointment   Over the next 120 days, patient will demonstrate improved adherence to prescribed treatment plan for hypertension as evidenced by taking all medications as  prescribed, monitoring and recording blood pressure as directed, adhering to low sodium/DASH diet  Over the next 120 days, patient will demonstrate improved health management independence as evidenced by checking blood pressure as directed and notifying PCP if SBP>160 or DBP > 90, taking all medications as prescribe, and adhering to a low sodium diet as discussed. Interventions:   Collaboration with Valerie Roys, DO regarding development and update of comprehensive plan of care as evidenced by provider attestation and co-signature  Inter-disciplinary care team collaboration (see longitudinal plan of care)  UNABLE to independently:manage HTN  Evaluation of current treatment plan related to hypertension self management and patient's adherence to plan as established by provider. 08-16-2020: Home health is coming into the home now working with the patient. Nurse has been there, pt working with the patient. OT ordered by home health provider   Provided education to patient re: stroke prevention, s/s of heart attack and stroke, DASH diet, complications of uncontrolled blood pressure  Reviewed medications with patient and discussed importance of compliance. 08-16-2020: The daughter is more at ease about the patients medications at this time.  Discussed plans with patient for ongoing care management follow up and provided patient with direct contact information for care management team  Advised patient, providing education and rationale, to monitor blood pressure daily and record, calling PCP for findings outside established parameters.  Patient Goals/Self-Care Activities  Over the next 120 days, patient will:  - UNABLE to independently manage HTN Self administers medications as prescribed Attends all scheduled provider appointments Calls provider office for new concerns, questions, or BP outside discussed parameters Checks BP and records as discussed Follows a low sodium diet/DASH diet -  blood pressure trends reviewed - depression screen reviewed - home or ambulatory blood pressure monitoring encouraged Follow Up Plan: Telephone follow up appointment with care management team member scheduled for: 09-19-2020 at 10:30 am   Care Plan : RNCM: Depression (Adult)  Updates made by Vanita Ingles since 08/16/2020 12:00 AM    Problem: RNCM: Depression Identification (Depression)   Priority: Medium    Goal: RNCM: Depressive Symptoms Identified   Priority: Medium  Note:   Current Barriers:   Knowledge Deficits related to depression and anxiety   Chronic Disease Management support and education needs related to depression and anxiety   Lacks caregiver support.  Film/video editor.   Transportation barriers  Non-adherence to scheduled provider appointments  Non-adherence to prescribed medication regimen  Unable to independently manage depression and anxiety   Unable to self administer medications as prescribed  Does not attend all scheduled provider appointments  Does not adhere to prescribed medication regimen  Lacks social connections  Unable to perform ADLs independently  Unable to perform IADLs independently  Does not maintain contact with provider office  Does not contact provider office for questions/concerns  Nurse Case Manager Clinical Goal(s):   Over the next 120 days, patient will verbalize understanding of plan for management of depression and anxiety  Over the next 120 days, patient will work with RNCM, CCM, and pcp to address needs related to depression and anxiety  Over the next 120 days, patient will attend all scheduled medical appointments: will call for appointment with pcp  Interventions:   1:1 collaboration with Valerie Roys, DO regarding development and update of comprehensive plan of care as evidenced by provider attestation and co-signature  Inter-disciplinary care team collaboration (see longitudinal plan of  care)  Evaluation of current treatment plan related to anxiety and depression  and patient's adherence to plan as established by provider. 08-16-2020: Saw neurologist and eye specialist. 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 Will need CCM team support   Provided education to patient and/or caregiver about advanced directives  Provided education to patient re: calling the office for changes in mood/anxiety/depression  Reviewed medications with patient and discussed compliance, poly pharmacy, and duplications- pharmacy referral   Collaborated with RNCM, pcp, and CCM team regarding patients expressed needs   Discussed plans with patient for ongoing care management follow up and provided patient with direct contact information for care management team  Reviewed scheduled/upcoming provider appointments including: will see the pcp in the next few weeks, knows to call for changes   Patient Goals/Self-Care Activities Over the next 120 days, patient will:  - Patient will self administer medications as prescribed Patient will attend all scheduled provider appointments Patient will call pharmacy for medication refills Patient will continue to perform ADL's independently Patient will continue to perform IADL's independently Patient will call provider office for new concerns or questions Patient will work with BSW to address care coordination needs and will continue to work with the clinical team to address health care and disease management related needs.    - anxiety screen reviewed - depression screen reviewed - medication list reviewed - participation in psychiatric services encouraged - substance use assessed - substance use risk screen reviewed Follow Up Plan: Telephone follow up appointment with care management team member scheduled for: 09-19-2020 10:30 am         Plan:Telephone follow up appointment with care management team member scheduled for:  09-19-2020 at 10:30  am  Ney, MSN, [facility] Mobile: [phone]

## 2020-08-16 NOTE — Telephone Encounter (Signed)
Called and LVM with Aldrin giving the ok per Dr. Laural Benes for verbal orders requested.

## 2020-08-16 NOTE — Patient Instructions (Signed)
Visit Information  Goals Addressed            This Visit's Progress   . COMPLETED: RNCM: Manage My Medicine       Timeframe:  Short-Term Goal Priority:  High Start Date:      08-08-2020                       Expected End Date:      08-22-2020                 Follow Up Date 08-15-2020   - call for medicine refill 2 or 3 days before it runs out - call if I am sick and can't take my medicine - keep a list of all the medicines I take; vitamins and herbals too - learn to read medicine labels - use a pillbox to sort medicine - use an alarm clock or phone to remind me to take my medicine    Why is this important?   . These steps will help you keep on track with your medicines.   Notes: The patient daughter is managing medications at this time. Several conflicting medications and concerns. Pharmacy referral and collaboration with pcp. 08-16-2020: The pharmacist has called and worked with the patients daughter and the daughter now feels more confident about medications. Will continue to monitor and change plan of care accordingly as needed.        The patient verbalized understanding of instructions, educational materials, and care plan provided today and declined offer to receive copy of patient instructions, educational materials, and care plan.   Telephone follow up appointment with care management team member scheduled for: 09-19-2020 at 10:30 am  Alto Denver RN, MSN, CCM Community Care Coordinator   [facility] Mobile: [phone]

## 2020-08-17 ENCOUNTER — Telehealth: Payer: Federal, State, Local not specified - PPO | Admitting: Family

## 2020-08-22 ENCOUNTER — Telehealth: Payer: Self-pay

## 2020-08-22 NOTE — Telephone Encounter (Signed)
   Telephone encounter was:  Successful.  08/22/2020 Name: Tara [name] MRN: [id number] DOB: [birth year]  Tara [name] is a 75 y.o. year old female who is a primary care patient of Dorcas Carrow, DO . The community resource team was consulted for assistance with Transportation Needs , Food Insecurity and services for the blind  Care guide performed the following interventions: Follow up call placed to the patient to discuss status of referral patient's daughter [name] confirmed MOW has started and she received emailed information for transportation and services for the blind.  Follow Up Plan:  No further follow up planned at this time. The patient has been provided with needed resources.  Sherika Kubicki, AAS Paralegal, [facility] Care Guide . Embedded Care Coordination [facility]  Care Management  [address] [place], [place] [address] [email or web address]  ?? [phone]   [email or web address]

## 2020-08-30 ENCOUNTER — Ambulatory Visit: Payer: Federal, State, Local not specified - PPO | Admitting: Family Medicine

## 2020-08-31 ENCOUNTER — Other Ambulatory Visit: Payer: Self-pay | Admitting: Family Medicine

## 2020-08-31 NOTE — Telephone Encounter (Signed)
Requested medication (s) are due for refill today: yes  Requested medication (s) are on the active medication list:yes  Last refill:  08/04/2020  Future visit scheduled: yes  Notes to clinic:  this refill cannot be delegated   Requested Prescriptions  Pending Prescriptions Disp Refills   ALPRAZolam (XANAX) 0.5 MG tablet [Pharmacy Med Name: ALPRAZOLAM 0.5 MG TABLET] 30 tablet 0    Sig: Take 1 tablet (0.5 mg total) by mouth daily as needed for anxiety.      Not Delegated - Psychiatry:  Anxiolytics/Hypnotics Failed - 08/31/2020 12:40 PM      Failed - This refill cannot be delegated      Failed - Urine Drug Screen completed in last 360 days      Passed - Valid encounter within last 6 months    Recent Outpatient Visits           3 weeks ago Anxiety   [facility] [place], Megan P, DO   8 months ago Polyneuropathy, unspecified   [facility] [place], Scissors, DO   9 months ago Essential hypertension   [facility] [place], [place], DO   11 months ago Memory loss   [facility], [place], DO   1 year ago Temporal pain   [facility] Ramos, Burke, DO       Future Appointments             In 1 week Laural Benes, Oralia Rud, DO [employer], [facility]   In 1 month Walker, Storm Frisk, NP [employer], [facility]

## 2020-09-07 ENCOUNTER — Ambulatory Visit: Payer: Federal, State, Local not specified - PPO | Admitting: Family Medicine

## 2020-09-12 ENCOUNTER — Other Ambulatory Visit: Payer: Self-pay

## 2020-09-12 ENCOUNTER — Ambulatory Visit (INDEPENDENT_AMBULATORY_CARE_PROVIDER_SITE_OTHER): Payer: Federal, State, Local not specified - PPO | Admitting: Family Medicine

## 2020-09-12 ENCOUNTER — Encounter: Payer: Self-pay | Admitting: Family Medicine

## 2020-09-12 VITALS — BP 135/77 | HR 77 | Temp 98.2°F | Wt 195.0 lb

## 2020-09-12 DIAGNOSIS — F419 Anxiety disorder, unspecified: Secondary | ICD-10-CM | POA: Diagnosis not present

## 2020-09-12 DIAGNOSIS — K219 Gastro-esophageal reflux disease without esophagitis: Secondary | ICD-10-CM

## 2020-09-12 DIAGNOSIS — E782 Mixed hyperlipidemia: Secondary | ICD-10-CM | POA: Diagnosis not present

## 2020-09-12 DIAGNOSIS — J0111 Acute recurrent frontal sinusitis: Secondary | ICD-10-CM

## 2020-09-12 DIAGNOSIS — R7301 Impaired fasting glucose: Secondary | ICD-10-CM

## 2020-09-12 DIAGNOSIS — I10 Essential (primary) hypertension: Secondary | ICD-10-CM

## 2020-09-12 DIAGNOSIS — G629 Polyneuropathy, unspecified: Secondary | ICD-10-CM

## 2020-09-12 LAB — BAYER DCA HB A1C WAIVED: HB A1C (BAYER DCA - WAIVED): 6.2 % (ref <7.0)

## 2020-09-12 NOTE — Progress Notes (Signed)
BP 135/77   Pulse 77   Temp 98.2 F (36.8 C)   Wt 195 lb (88.5 kg)   SpO2 96%   BMI 33.47 kg/m    Subjective:    Patient ID: Tara BaliPhyllis Louise Hodzic, female    DOB: 10/08/1945, 75 y.o.   MRN: [id number]  HPI: Tara Balihyllis Louise [place] is a 75 y.o. female  Chief Complaint  Patient presents with  . Hypertension  . Anxiety  . Hyperlipidemia  . Sinusitis    Patient has sinus issues along with ear pain   . skin concern    Patient believes she has a yeast infection in her belly button for about a week she has discharge and odor     HYPERTENSION / HYPERLIPIDEMIA Satisfied with current treatment? yes Duration of hypertension: chronic BP monitoring frequency: not checking BP medication side effects: no Duration of hyperlipidemia: chronic Cholesterol medication side effects: no Cholesterol supplements: none Past cholesterol medications: fenofibrate, crestor Medication compliance: excellent compliance Aspirin: yes Recent stressors: yes Recurrent headaches: no Visual changes: yes Palpitations: no Dyspnea: no Chest pain: no Lower extremity edema: no Dizzy/lightheaded: no  ANXIETY/STRESS Duration: chronic Status:stable Anxious mood: yes  Excessive worrying: yes Irritability: no  Sweating: no Nausea: no Palpitations:no Hyperventilation: no Panic attacks: no Agoraphobia: no  Obscessions/compulsions: no Depressed mood: yes [facility] 08/08/2020 08/04/2020 04/22/2019 02/05/2019  Decreased Interest 0 0 2 2  Down, Depressed, Hopeless 3 0 2 2  PHQ - 2 Score 3 0 4 4  Altered sleeping 0 0 3 3  Tired, decreased energy 3 3 3 2   Change in appetite 0 0 2 3  Feeling bad or failure about yourself  0 0 2 2  Trouble concentrating 3 1 2 1   Moving slowly or fidgety/restless 3 0 0 2  Suicidal thoughts 0 0 0 0  PHQ-9 Score 12 4 16 17   Difficult doing work/chores Very difficult Somewhat difficult Extremely dIfficult Very difficult   Anhedonia: no Weight changes:  no Insomnia: yes   Hypersomnia: no Fatigue/loss of energy: yes Feelings of worthlessness: no Feelings of guilt: no Impaired concentration/indecisiveness: no Suicidal ideations: no  Crying spells: no Recent Stressors/Life Changes: yes   Relationship problems: no   Family stress: yes     Financial stress: no    Job stress: no    Recent death/loss: no  NEUROPATHY Neuropathy status: stable  Satisfied with current treatment?: no Medication side effects: no Medication compliance:  good compliance Location: R leg Pain: yes Severity: moderate  Quality:  Numb, tingling, burning Frequency: constant Bilateral: no Symmetric: no Numbness: no Decreased sensation: yes Weakness: yes Context: worse  UPPER RESPIRATORY TRACT INFECTION Duration: 3+ months Worst symptom: congestion Fever: no Cough: no Shortness of breath: no Wheezing: no Chest pain: yes, with cough Chest tightness: no Chest congestion: no Nasal congestion: yes Runny nose: yes Post nasal drip: yes Sneezing: no Sore throat: yes Swollen glands: no Sinus pressure: yes Headache: yes Face pain: yes Toothache: no Ear pain: yes bilateral Ear pressure: no  Eyes red/itching:no Eye drainage/crusting: no  Vomiting: no Rash: no Fatigue: no Sick contacts: no Strep contacts: no  Context: stable Recurrent sinusitis: no Relief with OTC cold/cough medications: no  Treatments attempted: antibiotics   Neurology 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 Has appointment to see them in April for follow up.  Patient has chronic pain- seeing pain management through [facility]. Started on percocet with them- she has a  controlled substance agreement, but they are allowing her to continue to get her lyrica from here. No reference made to her xanax.   Relevant past medical, surgical, family and social history reviewed and updated as indicated. Interim medical history since our last  visit reviewed. Allergies and medications reviewed and updated.  Review of Systems  Constitutional: Negative.   HENT: Positive for congestion, postnasal drip, rhinorrhea, sinus pressure and sinus pain. Negative for dental problem, drooling, ear discharge, ear pain, facial swelling, hearing loss, mouth sores, nosebleeds, sneezing, sore throat, tinnitus, trouble swallowing and voice change.   Eyes: Positive for visual disturbance. Negative for photophobia, pain, discharge, redness and itching.  Respiratory: Negative.   Cardiovascular: Negative.   Gastrointestinal: Negative.   Musculoskeletal: Positive for arthralgias, back pain and myalgias. Negative for gait problem, joint swelling, neck pain and neck stiffness.  Skin: Negative.   Neurological: Negative.   Psychiatric/Behavioral: Negative for agitation, behavioral problems, confusion, decreased concentration, dysphoric mood, hallucinations, self-injury, sleep disturbance and suicidal ideas. The patient is nervous/anxious. The patient is not hyperactive.     Per HPI unless specifically indicated above     Objective:    BP 135/77   Pulse 77   Temp 98.2 F (36.8 C)   Wt 195 lb (88.5 kg)   SpO2 96%   BMI 33.47 kg/m   Wt Readings from Last 3 Encounters:  09/12/20 195 lb (88.5 kg)  08/04/20 185 lb (83.9 kg)  05/01/20 185 lb 6 oz (84.1 kg)    Physical Exam Vitals and nursing note reviewed.  Constitutional:      General: She is not in acute distress.    Appearance: Normal appearance. She is not ill-appearing, toxic-appearing or diaphoretic.  HENT:     Head: Normocephalic and atraumatic.     Right Ear: External ear normal.     Left Ear: External ear normal.     Nose: Nose normal.     Mouth/Throat:     Mouth: Mucous membranes are moist.     Pharynx: Oropharynx is clear.  Eyes:     General: No scleral icterus.       Right eye: No discharge.        Left eye: No discharge.     Extraocular Movements: Extraocular movements intact.      Conjunctiva/sclera: Conjunctivae normal.     Pupils: Pupils are equal, round, and reactive to light.  Cardiovascular:     Rate and Rhythm: Normal rate and regular rhythm.     Pulses: Normal pulses.     Heart sounds: Normal heart sounds. No murmur heard. No friction rub. No gallop.   Pulmonary:     Effort: Pulmonary effort is normal. No respiratory distress.     Breath sounds: Normal breath sounds. No stridor. No wheezing, rhonchi or rales.  Chest:     Chest wall: No tenderness.  Musculoskeletal:        General: Normal range of motion.     Cervical back: Normal range of motion and neck supple.  Skin:    General: Skin is warm and dry.     Capillary Refill: Capillary refill takes less than 2 seconds.     Coloration: Skin is not jaundiced or pale.     Findings: No bruising, erythema, lesion or rash.  Neurological:     General: No focal deficit present.     Mental Status: She is alert and oriented to person, place, and time. Mental status is at baseline.  Psychiatric:  Mood and Affect: Mood normal.        Behavior: Behavior normal.        Thought Content: Thought content normal.        Judgment: Judgment normal.     Results for orders placed or performed in visit on 09/12/20  Bayer DCA Hb A1c Waived  Result Value Ref Range   HB A1C (BAYER DCA - WAIVED) 6.2 <7.0 %  CBC with Differential/Platelet  Result Value Ref Range   WBC 6.0 3.4 - 10.8 x10E3/uL   RBC 4.43 3.77 - 5.28 x10E6/uL   Hemoglobin 12.7 11.1 - 15.9 g/dL   Hematocrit 32.3 55.7 - 46.6 %   MCV 86 79 - 97 fL   MCH 28.7 26.6 - 33.0 pg   MCHC 33.3 31.5 - 35.7 g/dL   RDW 32.2 02.5 - 42.7 %   Platelets 176 150 - 450 x10E3/uL   Neutrophils 65 Not Estab. %   Lymphs 26 Not Estab. %   Monocytes 8 Not Estab. %   Eos 1 Not Estab. %   Basos 0 Not Estab. %   Neutrophils Absolute 3.8 1.4 - 7.0 x10E3/uL   Lymphocytes Absolute 1.6 0.7 - 3.1 x10E3/uL   Monocytes Absolute 0.5 0.1 - 0.9 x10E3/uL   EOS (ABSOLUTE) 0.1  0.0 - 0.4 x10E3/uL   Basophils Absolute 0.0 0.0 - 0.2 x10E3/uL   Immature Granulocytes 0 Not Estab. %   Immature Grans (Abs) 0.0 0.0 - 0.1 x10E3/uL  Comprehensive metabolic panel  Result Value Ref Range   Glucose 131 (H) 65 - 99 mg/dL   BUN 27 8 - 27 mg/dL   Creatinine, Ser 0.62 0.57 - 1.00 mg/dL   GFR calc non Af Amer 60 >59 mL/min/1.73   GFR calc Af Amer 69 >59 mL/min/1.73   BUN/Creatinine Ratio 28 12 - 28   Sodium 143 134 - 144 mmol/L   Potassium 4.1 3.5 - 5.2 mmol/L   Chloride 107 (H) 96 - 106 mmol/L   CO2 19 (L) 20 - 29 mmol/L   Calcium 9.2 8.7 - 10.3 mg/dL   Total Protein 6.4 6.0 - 8.5 g/dL   Albumin 4.2 3.7 - 4.7 g/dL   Globulin, Total 2.2 1.5 - 4.5 g/dL   Albumin/Globulin Ratio 1.9 1.2 - 2.2   Bilirubin Total 0.2 0.0 - 1.2 mg/dL   Alkaline Phosphatase 68 44 - 121 IU/L   AST 17 0 - 40 IU/L   ALT 11 0 - 32 IU/L  Lipid Panel w/o Chol/HDL Ratio  Result Value Ref Range   Cholesterol, Total 113 100 - 199 mg/dL   Triglycerides 376 0 - 149 mg/dL   HDL 44 >28 mg/dL   VLDL Cholesterol Cal 19 5 - 40 mg/dL   LDL Chol Calc (NIH) 50 0 - 99 mg/dL  TSH  Result Value Ref Range   TSH 2.400 0.450 - 4.500 uIU/mL      Assessment & Plan:   Problem List Items Addressed This Visit      Cardiovascular and Mediastinum   HTN (hypertension)    Under good control on current regimen. Continue current regimen. Continue to monitor. Call with any concerns. Refills given. Labs drawn today.        Relevant Orders   CBC with Differential/Platelet (Completed)   Comprehensive metabolic panel (Completed)   TSH (Completed)     Digestive   GERD (gastroesophageal reflux disease)    Under good control on current regimen. Continue current regimen. Continue to monitor. Call with  any concerns. Refills given. Labs drawn today.       Relevant Medications   ondansetron (ZOFRAN) 4 MG tablet   Other Relevant Orders   CBC with Differential/Platelet (Completed)   Comprehensive metabolic panel  (Completed)     Nervous and Auditory   Polyneuropathy, unspecified    Under good control on current regimen. Continue current regimen. Continue to monitor. Call with any concerns. Refills given. Labs drawn today.       Relevant Medications   lamoTRIgine (LAMICTAL) 25 MG tablet   traZODone (DESYREL) 50 MG tablet   pregabalin (LYRICA) 75 MG capsule   ALPRAZolam (XANAX) 0.5 MG tablet   Other Relevant Orders   CBC with Differential/Platelet (Completed)   Comprehensive metabolic panel (Completed)     Other   Anxiety    Reached out to her pain management provider to confirm that they know about her xanax. Has been stable on her medicine. Refills given today. Call with any concerns. Continue to monitor.       Relevant Medications   traZODone (DESYREL) 50 MG tablet   ALPRAZolam (XANAX) 0.5 MG tablet   Other Relevant Orders   CBC with Differential/Platelet (Completed)   Comprehensive metabolic panel (Completed)   TSH (Completed)   Hyperlipidemia    Under good control on current regimen. Continue current regimen. Continue to monitor. Call with any concerns. Refills given. Labs drawn today.       Relevant Orders   CBC with Differential/Platelet (Completed)   Comprehensive metabolic panel (Completed)   Lipid Panel w/o Chol/HDL Ratio (Completed)    Other Visit Diagnoses    Acute recurrent frontal sinusitis    -  Primary   No better. Will get her into ENT. Referral generated today. Await their input.    Relevant Orders   Ambulatory referral to ENT   IFG (impaired fasting glucose)       Checking labs today. Await results. Treat as needed.    Relevant Orders   Bayer DCA Hb A1c Waived (Completed)   CBC with Differential/Platelet (Completed)   Comprehensive metabolic panel (Completed)       Follow up plan: Return in about 3 months (around 12/10/2020).

## 2020-09-13 ENCOUNTER — Telehealth: Payer: Self-pay | Admitting: Family Medicine

## 2020-09-13 LAB — TSH: TSH: 2.4 u[IU]/mL (ref 0.450–4.500)

## 2020-09-13 LAB — CBC WITH DIFFERENTIAL/PLATELET
Basophils Absolute: 0 10*3/uL (ref 0.0–0.2)
Basos: 0 %
EOS (ABSOLUTE): 0.1 10*3/uL (ref 0.0–0.4)
Eos: 1 %
Hematocrit: 38.1 % (ref 34.0–46.6)
Hemoglobin: 12.7 g/dL (ref 11.1–15.9)
Immature Grans (Abs): 0 10*3/uL (ref 0.0–0.1)
Immature Granulocytes: 0 %
Lymphocytes Absolute: 1.6 10*3/uL (ref 0.7–3.1)
Lymphs: 26 %
MCH: 28.7 pg (ref 26.6–33.0)
MCHC: 33.3 g/dL (ref 31.5–35.7)
MCV: 86 fL (ref 79–97)
Monocytes Absolute: 0.5 10*3/uL (ref 0.1–0.9)
Monocytes: 8 %
Neutrophils Absolute: 3.8 10*3/uL (ref 1.4–7.0)
Neutrophils: 65 %
Platelets: 176 10*3/uL (ref 150–450)
RBC: 4.43 x10E6/uL (ref 3.77–5.28)
RDW: 14 % (ref 11.7–15.4)
WBC: 6 10*3/uL (ref 3.4–10.8)

## 2020-09-13 LAB — COMPREHENSIVE METABOLIC PANEL
ALT: 11 IU/L (ref 0–32)
AST: 17 IU/L (ref 0–40)
Albumin/Globulin Ratio: 1.9 (ref 1.2–2.2)
Albumin: 4.2 g/dL (ref 3.7–4.7)
Alkaline Phosphatase: 68 IU/L (ref 44–121)
BUN/Creatinine Ratio: 28 (ref 12–28)
BUN: 27 mg/dL (ref 8–27)
Bilirubin Total: 0.2 mg/dL (ref 0.0–1.2)
CO2: 19 mmol/L — ABNORMAL LOW (ref 20–29)
Calcium: 9.2 mg/dL (ref 8.7–10.3)
Chloride: 107 mmol/L — ABNORMAL HIGH (ref 96–106)
Creatinine, Ser: 0.95 mg/dL (ref 0.57–1.00)
GFR calc Af Amer: 69 mL/min/{1.73_m2} (ref >59)
GFR calc non Af Amer: 60 mL/min/{1.73_m2} (ref >59)
Globulin, Total: 2.2 g/dL (ref 1.5–4.5)
Glucose: 131 mg/dL — ABNORMAL HIGH (ref 65–99)
Potassium: 4.1 mmol/L (ref 3.5–5.2)
Sodium: 143 mmol/L (ref 134–144)
Total Protein: 6.4 g/dL (ref 6.0–8.5)

## 2020-09-13 LAB — LIPID PANEL W/O CHOL/HDL RATIO
Cholesterol, Total: 113 mg/dL (ref 100–199)
HDL: 44 mg/dL (ref >39)
LDL Chol Calc (NIH): 50 mg/dL (ref 0–99)
Triglycerides: 103 mg/dL (ref 0–149)
VLDL Cholesterol Cal: 19 mg/dL (ref 5–40)

## 2020-09-13 MED ORDER — ONDANSETRON HCL 4 MG PO TABS: 4.0000 mg | ORAL_TABLET | Freq: Four times a day (QID) | ORAL | 1 refills | Status: DC | PRN

## 2020-09-13 MED ORDER — PREGABALIN 75 MG PO CAPS: ORAL_CAPSULE | ORAL | 2 refills | Status: DC

## 2020-09-13 MED ORDER — ALPRAZOLAM 0.5 MG PO TABS: 0.5000 mg | ORAL_TABLET | Freq: Every day | ORAL | 2 refills | Status: DC | PRN

## 2020-09-13 MED ORDER — TRAZODONE HCL 50 MG PO TABS: 50.0000 mg | ORAL_TABLET | Freq: Every day | ORAL | 1 refills | Status: DC

## 2020-09-13 MED ORDER — NYSTATIN 100000 UNIT/GM EX OINT: 1.0000 "application " | TOPICAL_OINTMENT | Freq: Two times a day (BID) | CUTANEOUS | 0 refills | Status: DC

## 2020-09-13 NOTE — Telephone Encounter (Signed)
Please advise 

## 2020-09-13 NOTE — Telephone Encounter (Signed)
Patient's daughter has made contact regarding patient's new prescription for ALPRAZolam Prudy Feeler) 0.5 MG   Patient's pain management plan has been suspended due to a medication interference involving the prescription   Patient's daughter would like notice of the prescription being canceled or rescinded submitted to [facility]   [facility] [phone]  Please contact to advise

## 2020-09-14 NOTE — Telephone Encounter (Signed)
Please cancel Rx for alprazolam at pharmacy and alert [facility] that it has been cancelled. Thanks.

## 2020-09-14 NOTE — Telephone Encounter (Signed)
Medications cancelled at pharmacy, [facility] notified that the medication was not picked  up and that it had been cancelled.

## 2020-09-16 ENCOUNTER — Encounter: Payer: Self-pay | Admitting: Family Medicine

## 2020-09-16 NOTE — Assessment & Plan Note (Signed)
Under good control on current regimen. Continue current regimen. Continue to monitor. Call with any concerns. Refills given. Labs drawn today.   

## 2020-09-16 NOTE — Assessment & Plan Note (Signed)
Reached out to her pain management provider to confirm that they know about her xanax. Has been stable on her medicine. Refills given today. Call with any concerns. Continue to monitor.

## 2020-09-19 ENCOUNTER — Ambulatory Visit (INDEPENDENT_AMBULATORY_CARE_PROVIDER_SITE_OTHER): Payer: Federal, State, Local not specified - PPO | Admitting: General Practice

## 2020-09-19 ENCOUNTER — Telehealth: Payer: Federal, State, Local not specified - PPO | Admitting: General Practice

## 2020-09-19 DIAGNOSIS — E782 Mixed hyperlipidemia: Secondary | ICD-10-CM

## 2020-09-19 DIAGNOSIS — I10 Essential (primary) hypertension: Secondary | ICD-10-CM

## 2020-09-19 DIAGNOSIS — Z8673 Personal history of transient ischemic attack (TIA), and cerebral infarction without residual deficits: Secondary | ICD-10-CM

## 2020-09-19 DIAGNOSIS — G3184 Mild cognitive impairment, so stated: Secondary | ICD-10-CM

## 2020-09-19 DIAGNOSIS — F419 Anxiety disorder, unspecified: Secondary | ICD-10-CM

## 2020-09-19 DIAGNOSIS — F339 Major depressive disorder, recurrent, unspecified: Secondary | ICD-10-CM

## 2020-09-19 NOTE — Patient Instructions (Signed)
Visit Information  PATIENT GOALS: Patient Care Plan: General Social Work (Adult)    Problem Identified: Coping Skills (General Plan of Care)     Goal: Coping Skills Enhanced   Start Date: 07/31/2020  Priority: Medium  Note:   Evidence-based guidance:   Acknowledge, normalize and validate difficulty of making life-long lifestyle changes.   Identify current effective and ineffective coping strategies.   Encourage patient and caregiver participation in care to increase self-esteem, confidence and feelings of control.   Consider alternative and complementary therapy approaches such as meditation, mindfulness or yoga.   Encourage participation in cognitive behavioral therapy to foster a positive identity, increase self-awareness, as well as bolster self-esteem, confidence and self-efficacy.   Discuss spirituality; be present as concerns are identified; encourage journaling, prayer, worship services, meditation or pastoral counseling.   Encourage participation in pleasurable group activities such as hobbies, singing, sports or volunteering).   Encourage the use of mindfulness; refer for training or intensive intervention.   Consider the use of meditative movement therapy such as tai chi, yoga or qigong.   Promote a regular daily exercise program based on tolerance, ability and patient choice to support positive thinking about disease or aging.   Notes:   Timeframe:  Long-Range Goal Priority:  Medium Start Date: 07/31/20 , adjusted on 08/09/20                          Expected End Date: 09/28/20                    Follow Up Date -90 days from 08/09/20   - check out options for in-home help, long-term care or hospice - complete a living will - discuss my treatment options with the doctor or nurse - do one enjoyable thing every day - do something different, like talking to a new person or going to a new place, every day - learn something new by asking, reading and searching the  Internet every day - make an audio or video recording for my loved ones - make shared treatment decisions with doctor - meditate daily - name a health care proxy (decision maker) - share memories using a picture album or scrapbook with my loved ones - spend time with a child every day, borrow one if I have to - spend time outdoors at least 3 times a week - strengthen or fix relationships with loved ones    Why is this important?    Having a long-term illness can be scary.   It can also be stressful for you and your caregiver.   These steps may help.    Assessment, press & current barriers:  Patient unable to consistently perform activities of daily living and needs additional assistance and support in order to meet this unmet need . Limited social support, Level of care concerns, ADL IADL limitations, Social Isolation, Limited access to caregiver, Cognitive Deficits, Memory Deficits, and Blind . Lacks social connections  Clinical Goals:   Over the next 120 days, patient will have additional in home support education provided to both patient and daughter (caregiver) in order to increase support and safety within the home Over the next 120 days, patient/caregiver will work with SW to address concerns related to care coordination needs, lack of a stable support network and lack of Brewing technologist. LCSW will assist patient in gaining additional support in order to maintain health and mental health appropriately  Over the next  120 days, patient will demonstrate improved adherence to self care as evidenced by implementing healthy self-care into her daily routine such as: attending all medical appointments, deep breathing exercises, taking time for self-reflection, taking medications as prescribed, drinking water and daily exercise to improve mobility and mood.  Over the next 120 days, patient will demonstrate improved health management independence as evidenced by  implementing healthy self-care skills and positive support/resource implementation into their daily routine to help cope with stressors and improve overall health and well-being  Over the next 120 days, patient or caregiver will verbalize basic understanding of depression/stress process and self health management plan as evidenced by her participation in development of long term plan of care and institution of self health management strategies  Interventions : . Assessed needs, level of care concerns, basic eligibility and provided education on in home support resources . UPDATE 08/09/20- Family is very concerned about patient's current medications. [name] (daughter) is wanting direction from PCP on how to safely give patient her current medications. She is very concerned about over medicating patient since they changed pharmacies. Patient has a pain management appointment today at 12:40 pm and they are asking for pharmacist to call them after 2 if that works. Pharmacist is agreeable to reschedule time of her CCM Pharmacist appointment for today. They are wondering if pharmacist could possibly communicate with Dr. Wynetta Emery regarding these medication concerns if they cannot speak directly to PCP themselves. CCM LCSW updated CCM RNCM and PCP on this concern as well. CCM LCSW provided emotional support to caregiver today. CCM LCSW also provided education on [employer] and their caregiver support programs. CCM LCSW encouraged family to contact program about their caregiver support programs ASAP. Marland Kitchen Reviewed community support options (PCS, CHORE, [facility], CAP, private pay, PACE program) . Collaborate with primary care provider per patient request. Patient wishes for PCP to know that she is battling another sinus infection.  . Provided family with community resources for the blind. DSS Social Worker of the Blind- Phone: [phone]. The [place] [facility] of Services for the Blind (DSB) provides services  to people who are blind or visually impaired to help them reach their goals of independence and employment-[phone]. Cleora Fleet, Adaptive and Inclusive Recreation Coordinator-[phone]. These resources and other PCS resources were sent to daughter's email on 07/31/20.  . Patient reports that her memory loss has increased since her most recent stroke. She reports ongoing issues with finding her words in order to communicate. . Patient reports that her xanax medication continues to help her manage her anxiety and panic.  Marland Kitchen Patient's spouse is also in need of a caregiver as his last aide through the [place] ended her services. Family is currently working with [place] social worker to get these services started again.  . Patient has a recent neurologist appointment and will have a functional capacity test completed in [name] this week.  . Patient reports that her main socialization comes from talking over the phone to her sister, friend and cousin.  . Patient interviewed and appropriate assessments performed . Discussed plans with patient for ongoing care management follow up and provided patient with direct contact information for care management team . Assisted patient/caregiver with obtaining information about health plan benefits . Provided education and assistance to client regarding Advanced Directives. . Provided education to patient/caregiver regarding level of care options. . Provided education to patient/caregiver about Hospice and/or Palliative Care services . Other interventions provided: Solution-Focused Strategies Patient Goals/Self-Care Activities: Over the next 120 days .  Review resources provided  . Call agencies listed in email that was sent out on 07/31/20.    Task: Support Psychosocial Response to Risk or Actual Health Condition   Note:   Care Management Activities:    - active listening utilized - counseling provided - current coping strategies identified - decision-making  supported - healthy lifestyle promoted - journaling promoted - meditative movement therapy encouraged - mindfulness encouraged - participation in counseling encouraged - problem-solving facilitated - relaxation techniques promoted - self-reflection promoted - spiritual activities promoted - verbalization of feelings encouraged    Notes:    Patient Care Plan: RNCM: HLD management    Problem Identified: RNCM: HLD management   Priority: Medium    Long-Range Goal: RNCM: HLD Coping Skills Enhanced   Priority: Medium  Note:   Current Barriers:  . Poorly controlled hyperlipidemia, complicated by smoker, memory changes, several chronic conditions  . Current antihyperlipidemic regimen: Is unsure about the medications, referral in for pharmacy support and pcp recommendations  . Most recent lipid panel:  . Lab Results .  Component . Value . Date .   Marland Kitchen CHOL . 113 . 09/12/2020 .   Marland Kitchen HDL . 44 . 09/12/2020 .   Marland Kitchen [place] . 50 . 09/12/2020 .   Marland Kitchen LDLDIRECT . 145.7 (H) . 07/13/2019 .   Marland Kitchen TRIG . 103 . 09/12/2020 .   Marland Kitchen CHOLHDL . 5.5 . 07/13/2019 .    Marland Kitchen ASCVD risk enhancing conditions: age >78, HTN, current smoker . Unable to independently manage HLD . Unable to self administer medications as prescribed . Does not attend all scheduled provider appointments . Does not adhere to prescribed medication regimen . Lacks social connections . Unable to perform ADLs independently . Unable to perform IADLs independently . Does not maintain contact with provider office . Does not contact provider office for questions/concerns  RN Care Manager Clinical Goal(s):  Marland Kitchen Over the next 120 days, patient will work with Consulting civil engineer, providers, and care team towards execution of optimized self-health management plan . Over the next 120 days, patient will verbalize understanding of plan for effective management of HLD . Over the next 120 days, patient will work with [facility], [place] team and pcp to address needs  related to HLD management   Interventions: . Collaboration with Valerie Roys, DO regarding development and update of comprehensive plan of care as evidenced by provider attestation and co-signature . Inter-disciplinary care team collaboration (see longitudinal plan of care) . Medication review performed; medication list updated in electronic medical record. 09-19-2020: Reviewed  . Inter-disciplinary care team collaboration (see longitudinal plan of care) . Referred to pharmacy team for assistance with HLD medication management . Evaluation of current treatment plan related to HLD and patient's adherence to plan as established by provider. 09-19-2020: The patients daughter states that the patient is eating well and she is monitoring her intake.  . Advised patient to call the office for changes in conditions or questions. 09-19-2020: Review and reinforced  . Provided education to patient re: working with CCM team, referrals in place, and collaboration with pcp . Discussed plans with patient for ongoing care management follow up and provided patient with direct contact information for care management team . Next appointment with pcp on 12-11-2020 at 3 pm   Patient Goals/Self-Care Activities: . Over the next 120 days, patient will:   - call for medicine refill 2 or 3 days before it runs out - call if I am sick and can't  take my medicine - keep a list of all the medicines I take; vitamins and herbals too - learn to read medicine labels - use a pillbox to sort medicine- 09-19-2020: The patients daughter [name] is managing the patients medication - use an alarm clock or phone to remind me to take my medicine - change to whole grain breads, cereal, pasta - drink 6 to 8 glasses of water each day - eat 3 to 5 servings of fruits and vegetables each day - eat 5 or 6 small meals each day - fill half the plate with nonstarchy vegetables - limit fast food meals to no more than 1 per week - manage portion  size - prepare main meal at home 3 to 5 days each week - read food labels for fat, fiber, carbohydrates and portion size - be open to making changes - I can manage, know and watch for signs of a heart attack - if I have chest pain, call for help - learn about small changes that will make a big difference - learn my personal risk factors  - active listening utilized - caregiver stress acknowledged - counseling provided - current coping strategies identified - decision-making supported - healthy lifestyle promoted - meditative movement therapy encouraged - mindfulness encouraged - participation in counseling encouraged - problem-solving facilitated - self-reflection promoted - spiritual activities promoted - verbalization of feelings encouraged  Follow Up Plan: Telephone follow up appointment with care management team member scheduled for: 11-14-2020 at 0945 am     Task: RNCM: HLD-Support Psychosocial Response to Risk or Actual Health Condition   Note:   Care Management Activities:    - active listening utilized - caregiver stress acknowledged - counseling provided - current coping strategies identified - decision-making supported - healthy lifestyle promoted - meditative movement therapy encouraged - mindfulness encouraged - participation in counseling encouraged - problem-solving facilitated - self-reflection promoted - spiritual activities promoted - verbalization of feelings encouraged       Problem Identified: RNCM: Vision Changes Quality of Life (General Plan of Care)   Priority: High    Goal: RNCM: Vision Changes Quality of Life Maintained   Priority: High  Note:   Current Barriers:  Marland Kitchen Knowledge Deficits related to vision changes due to CVA . Care Coordination needs related to resources in the community  in a patient with changes in vision and loss of vision  . Lacks caregiver support.  . Film/video editor.  . Transportation barriers . Cognitive  Deficits . Unable to independently manage care due to changes in vision . Unable to self administer medications as prescribed . Does not attend all scheduled provider appointments . Does not adhere to prescribed medication regimen . Lacks social connections . Unable to perform ADLs independently . Unable to perform IADLs independently . Does not maintain contact with provider office . Does not contact provider office for questions/concerns  Nurse Case Manager Clinical Goal(s):  Marland Kitchen Over the next 120 days, patient will verbalize understanding of plan for effective management of meeting needs in patient with vision changes  . Over the next 120 days, patient will work with [facility], [place] team, and specialist  to address needs related to vision loss  . Over the next 120 days, patient will attend all scheduled medical appointments: eye specialist on 08-10-2020, appointment completed. The patient declared legally blind Interventions:  . 1:1 collaboration with Valerie Roys, DO regarding development and update of comprehensive plan of care as evidenced by provider attestation and co-signature .  Inter-disciplinary care team collaboration (see longitudinal plan of care) . Evaluation of current treatment plan related to loss of vision  and patient's adherence to plan as established by provider. 08-16-2020: Saw eye specialist last week. Was diagnosed with cortical blindness. The patient if not handling well, but has support from her daughter. 09-19-2020: The daughter states things are about the same. They are adjusting to the new diagnosis and are taking things a day at a time.  . Advised patient to keep appointment with eye doctor, call office for changes in condition  . Provided education to patient re: services for the blind and working with resources to help with patients expressed needs due to vision loss. 08-16-2020: Review of resources available for services for the blind. This is all new to the daughter and  the patient. Will continue to monitor for changes and needs. 09-19-2020: The daughter has resources. Also sending text message so the daughter can sign up for my chart to view notes and records. Text message sent to [phone]. Education provided for the daughter.   . Discussed plans with patient for ongoing care management follow up and provided patient with direct contact information for care management team . Reviewed scheduled/upcoming provider appointments including: eye specialist on 08-15-2020 at 0900 am. 08-16-2020: Completed this appointment and will see pcp on 12-11-2020 at 3 pm, saw pcp on 09-12-2020.    Patient Goals/Self-Care Activities Over the next 120 days, patient will:  - Patient will self administer medications as prescribed Patient will attend all scheduled provider appointments Patient will call pharmacy for medication refills Patient will continue to perform ADL's independently Patient will continue to perform IADL's independently Patient will call provider office for new concerns or questions Patient will work with BSW to address care coordination needs and will continue to work with the clinical team to address health care and disease management related needs.    - affirmation provided - community involvement promoted - expression of thoughts about present/future encouraged - independence in all possible areas promoted - patient strengths promoted - self-expression encouraged - sleep hygiene techniques encouraged - social relationships promoted Follow Up Plan: Telephone follow up appointment with care management team member scheduled for: 11-14-2020 at 0945 am       Task: RBNCM: Support and Maintain Acceptable Degree of Health, Comfort and Happiness   Note:   Care Management Activities:    - affirmation provided - community involvement promoted - expression of thoughts about present/future encouraged - independence in all possible areas promoted - patient  strengths promoted - self-expression encouraged - sleep hygiene techniques encouraged - social relationships promoted       Patient Care Plan: RNCM: Stroke (Adult)    Problem Identified: RNCM: Emotional Adjustment to Disease (Stroke)   Priority: High    Goal: RNCM: Effective management of stroke   Priority: High  Note:   Current Barriers:  Marland Kitchen Knowledge Deficits related to effective management of s/p stroke and TIas . 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 Chronic Disease Management support and education needs related to management of stroke and prevention of new HA and/orstroke  . Lacks caregiver support.  . Film/video editor.  . Transportation barriers . Cognitive Deficits . Unable to independently manage care due to "mini-strokes" and TIAs . Unable to self administer medications as prescribed . Lacks social connections . Unable to perform ADLs independently .  Unable to perform IADLs independently . Does not contact provider office for questions/concerns  Nurse Case Manager Clinical Goal(s):  Marland Kitchen Over the next 120 days, patient will verbalize understanding of plan for effective management of CVA and residual effects from TIAs  . Over the next 120 days, patient will work with [facility], CCM team and pcp to address needs related to **care of patient in the home with post residual deficits and decline in condition . Over the next 120 days, patient will demonstrate a decrease in TIA episodes  exacerbations as evidenced by no new CVA events  . Over the next 120 days, patient will work with CM team pharmacist to reconcile medications, polypharmacy and dubplications  . Over the next 120 days, patient will work with care guides  (community agency) to help with expressed needs: transportation, food resources, and community resources   Interventions:  . 1:1  collaboration with Valerie Roys, DO regarding development and update of comprehensive plan of care as evidenced by provider attestation and co-signature . Inter-disciplinary care team collaboration (see longitudinal plan of care) . Provided education to patient re: talking to care guides about resources, calling insurance to inquire about life alert, working with CCM team to meet expressed needs  . Reviewed medications with patient and discussed compliance and pharmacy referral. 08-16-2020: The pharmacist has reviewed with the daughter medications list and the daughter is more at ease with medications. The pharmacist will continue to work with the patients daughter. 09-19-2020: The daughter [name] denies any medication concerns today.  Nash Dimmer with pcp and pharmacy  regarding medication question and concerns. 09-19-2020: The patients daughter is managing medications and has not new questions at this time. Will continue to follow. . Discussed plans with patient for ongoing care management follow up and provided patient with direct contact information for care management team . Care Guide referral for food, transportation, and community resources  . Social Work referral for needs in the home, placement questions- currently working with LCSW . Pharmacy referral for polypharmacy, medication reconciliation, duplicate medications   Patient Goals/Self-Care Activities Over the next 120 days, patient will:  - Patient will self administer medications as prescribed Patient will attend all scheduled provider appointments Patient will call pharmacy for medication refills Patient will continue to perform ADL's independently Patient will continue to perform IADL's independently Patient will call provider office for new concerns or questions Patient will work with BSW to address care coordination needs and will continue to work with the clinical team to address health care and disease management related needs.    - caregiver stress acknowledged - caregiver support provided - counseling provided - decision-making supported - depression screen reviewed - family care conference arranged - financial counseling provided - goal-setting facilitated - positive reinforcement provided - problem-solving facilitated - self-care encouraged - self-reflection promoted - verbalization of feelings encouraged   Follow Up Plan: Telephone follow up appointment with care management team member scheduled for: 11-14-2020 0945 am       Task: RNCM: Support Psychosocial Response to Stroke   Note:   Care Management Activities:    - caregiver stress acknowledged - caregiver support provided - counseling provided - decision-making supported - depression screen reviewed - family care conference arranged - financial counseling provided - goal-setting facilitated - positive reinforcement provided - problem-solving facilitated - self-care encouraged - self-reflection promoted - verbalization of feelings encouraged       Patient Care Plan: RNCM: Hypertension (Adult)    Problem Identified: RNCM: Hypertension (  Hypertension)   Priority: Medium    Goal: RNCM: Hypertension Monitored   Priority: Medium  Note:   Objective:  . Last practice recorded BP readings:  . BP Readings from Last 3 Encounters: .  09/12/20 . 135/77 .  08/04/20 . 135/65 .  05/01/20 . (!) 150/90 .    Marland Kitchen Most recent eGFR/CrCl: No results found for: EGFR  No components found for: CRCL Current Barriers:  Marland Kitchen Knowledge Deficits related to basic understanding of hypertension pathophysiology and self care management . Knowledge Deficits related to understanding of medications prescribed for management of hypertension . Non-adherence to prescribed medication regimen . Non-adherence to scheduled provider appointments . Transportation barriers . Cognitive Deficits . Limited Social Support . Film/video editor.  . Unable to independently  manage HTN . Unable to self administer medications as prescribed . Does not attend all scheduled provider appointments . Does not adhere to prescribed medication regimen . Lacks social connections . Unable to perform ADLs independently . Unable to perform IADLs independently . Does not maintain contact with provider office . Does not contact provider office for questions/concerns Case Manager Clinical Goal(s):  Marland Kitchen Over the next 120 days, patient will verbalize understanding of plan for hypertension management . Over the next 120 days, patient will attend all scheduled medical appointments: will see pcp in the next few weeks. Knows to call for an appointment  . Over the next 120 days, patient will demonstrate improved adherence to prescribed treatment plan for hypertension as evidenced by taking all medications as prescribed, monitoring and recording blood pressure as directed, adhering to low sodium/DASH diet . Over the next 120 days, patient will demonstrate improved health management independence as evidenced by checking blood pressure as directed and notifying PCP if SBP>160 or DBP > 90, taking all medications as prescribe, and adhering to a low sodium diet as discussed. Interventions:  . Collaboration with Valerie Roys, DO regarding development and update of comprehensive plan of care as evidenced by provider attestation and co-signature . Inter-disciplinary care team collaboration (see longitudinal plan of care) . UNABLE to independently:manage HTN . Evaluation of current treatment plan related to hypertension self management and patient's adherence to plan as established by provider. 08-16-2020: Home health is coming into the home now working with the patient. Nurse has been there, pt working with the patient. OT ordered by home health provider. 09-19-2020: The patients daughter states that it is a day to day situation. She says things are about the same.  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  The patients daughter says she is doing what she needs to do to care for the patient. She is still having the sinusitis issue and is waiting to hear from the ENT for follow up. Will continue to monitor for changes.  . Provided education to patient re: stroke prevention, s/s of heart attack and stroke, DASH diet, complications of uncontrolled blood pressure . Reviewed medications with patient and discussed importance of compliance. 08-16-2020: The daughter is more at ease about the patients medications at this time. 09-19-2020: The patients daughter is managing medications for the patient. She is compliant with the current regimen.  . Discussed plans with patient for ongoing care management follow up and provided patient with direct contact information for care management team . Advised patient, providing education and rationale, to monitor blood pressure daily and record, calling PCP for findings outside established parameters.  Patient Goals/Self-Care Activities . Over the next 120 days, patient will:  - UNABLE to independently  manage HTN Self administers medications as prescribed Attends all scheduled provider appointments Calls provider office for new concerns, questions, or BP outside discussed parameters Checks BP and records as discussed Follows a low sodium diet/DASH diet- compliant with dietary restrictions  - blood pressure trends reviewed - depression screen reviewed - home or ambulatory blood pressure monitoring encouraged Follow Up Plan: Telephone follow up appointment with care management team member scheduled for: 11-14-2020 at 0945 am   Task: RNCM: Identify and Monitor Blood Pressure Elevation   Note:   Care Management Activities:    - blood pressure trends reviewed - depression screen reviewed - home or ambulatory blood pressure monitoring encouraged    Notes: Does not have a blood pressure cuff. Checking into OTC benefit with insurance company to obtain a blood pressure cuff.     Patient Care Plan: RNCM: Depression (Adult)    Problem Identified: RNCM: Depression Identification (Depression)   Priority: Medium    Goal: RNCM: Depressive Symptoms Identified   Priority: Medium  Note:   Current Barriers:  Marland Kitchen Knowledge Deficits related to depression and anxiety  . Chronic Disease Management support and education needs related to depression and anxiety  . Lacks caregiver support.  . Film/video editor.  . Transportation barriers . Non-adherence to scheduled provider appointments . Non-adherence to prescribed medication regimen . Unable to independently manage depression and anxiety  . Unable to self administer medications as prescribed . Does not attend all scheduled provider appointments . Does not adhere to prescribed medication regimen . Lacks social connections . Unable to perform ADLs independently . Unable to perform IADLs independently . Does not maintain contact with provider office . Does not contact provider office for questions/concerns  Nurse Case Manager Clinical Goal(s):  Marland Kitchen Over the next 120 days, patient will verbalize understanding of plan for management of depression and anxiety . Over the next 120 days, patient will work with [place], [place], and pcp to address needs related to depression and anxiety . Over the next 120 days, patient will attend all scheduled medical appointments: will call for appointment with pcp  Interventions:  . 1:1 collaboration with Valerie Roys, DO regarding development and update of comprehensive plan of care as evidenced by provider attestation and co-signature . Inter-disciplinary care team collaboration (see longitudinal plan of care) . Evaluation of current treatment plan related to anxiety and depression  and patient's adherence to plan as established by provider. 08-16-2020: Saw neurologist and eye specialist. 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 Will need CCM team support. 09-19-2020: [name]  states that they are taking it day by day. She has good days and not so good days. The daughter is with the patient 24/7, she can tell when she is having an off day. 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 The daughter knows to call for changes.  . Provided education to patient and/or caregiver about advanced directives . Provided education to patient re: calling the office for changes in mood/anxiety/depression . Reviewed medications with patient and discussed compliance, poly pharmacy, and duplications- pharmacy referral  . Collaborated with RNCM, pcp, and CCM team regarding patients expressed needs  . Discussed plans with patient for ongoing care management follow up and provided patient with direct contact information for care management team . Reviewed scheduled/upcoming provider appointments including: will see the pcp in the next few weeks, knows to call for changes.  Next appointment on 12-11-2020 at 3 pm  Patient Goals/Self-Care Activities Over the next 120 days, patient  will:  - Patient will self administer medications as prescribed Patient will attend all scheduled provider appointments Patient will call pharmacy for medication refills Patient is dependent on daughter and others to help meet her ADLS/IADLS Patient will call provider office for new concerns or questions Patient will work with BSW to address care coordination needs and will continue to work with the clinical team to address health care and disease management related needs.    - anxiety screen reviewed - depression screen reviewed - medication list reviewed - participation in psychiatric services encouraged - substance use assessed - substance use risk screen reviewed Follow Up Plan: Telephone follow up appointment with care management team member scheduled for: 11-14-2020 0945 am       Task: RNCM: Identify Depressive Symptoms and Facilitate Treatment   Note:   Care Management Activities:    - anxiety screen  reviewed - depression screen reviewed - medication list reviewed - participation in psychiatric services encouraged - substance use assessed - substance use risk screen reviewed         The patient verbalized understanding of instructions, educational materials, and care plan provided today and declined offer to receive copy of patient instructions, educational materials, and care plan.   Telephone follow up appointment with care management team member scheduled for: 11-14-2020 at [place] am  Noreene Larsson RN, MSN, [facility] Mobile: [phone]

## 2020-09-19 NOTE — Chronic Care Management (AMB) (Signed)
Chronic Care Management   CCM RN Visit Note  09/19/2020 Name: Tara [name] MRN: [id number] DOB: 10/08/1945  Subjective: Tara [name] is a 75 y.o. year old female who is a primary care patient of Valerie Roys, DO. The care management team was consulted for assistance with disease management and care coordination needs.    Engaged with patient by telephone for follow up visit in response to provider referral for case management and/or care coordination services. Spoke to the patients daughter, Tara [name].   Consent to Services:  The patient was given information about Chronic Care Management services, agreed to services, and gave verbal consent prior to initiation of services.  Please see initial visit note for detailed documentation.   Patient agreed to services and verbal consent obtained.   Assessment: Review of patient past medical history, allergies, medications, health status, including review of consultants reports, laboratory and other test data, was performed as part of comprehensive evaluation and provision of chronic care management services.   SDOH (Social Determinants of Health) assessments and interventions performed:    CCM Care Plan  No Known Allergies  Outpatient Encounter Medications as of 09/19/2020  Medication Sig Note  . ALPRAZolam (XANAX) 0.5 MG tablet Take 1 tablet (0.5 mg total) by mouth daily as needed for anxiety.   Marland Kitchen amLODipine (NORVASC) 10 MG tablet Take 1 tablet (10 mg total) by mouth daily.   Marland Kitchen aspirin EC 81 MG tablet Take 1 tablet (81 mg total) by mouth daily.   . B-D TB SYRINGE 1CC/27GX1/2" 27G X 1/2" 1 ML MISC USE AS DIRECTED EVERY 30 DAYS   . citalopram (CELEXA) 40 MG tablet Take 1 tablet (40 mg total) by mouth daily.   . clopidogrel (PLAVIX) 75 MG tablet Take 1 tablet (75 mg total) by mouth daily.   . cyanocobalamin (,VITAMIN B-12,) 1000 MCG/ML injection ADMINISTER 1 ML(1000 MCG) IN THE MUSCLE EVERY 30 DAYS   . diclofenac Sodium  (VOLTAREN) 1 % GEL APPLY 4 GRAMS EXTERNALLY TO THE AFFECTED AREA FOUR TIMES DAILY   . donepezil (ARICEPT) 5 MG tablet Take 5 mg by mouth daily. (Patient not taking: No sig reported)   . famotidine (PEPCID) 20 MG tablet TAKE 1 TABLET(20 MG) BY MOUTH AT BEDTIME   . fenofibrate micronized (LOFIBRA) 200 MG capsule TAKE 1 CAPSULE(200 MG) BY MOUTH DAILY   . Insulin Syringe 27G X 1/2" 1 ML MISC 1 each by Does not apply route every 30 (thirty) days.   . isosorbide mononitrate (IMDUR) 60 MG 24 hr tablet Take 1 tablet (60 mg total) by mouth in the morning and at bedtime. (Patient taking differently: Take 60 mg by mouth in the morning and at bedtime. Filled today. Will have delivered tomorrow.)   . lamoTRIgine (LAMICTAL) 25 MG tablet Take by mouth.   . lidocaine (LIDODERM) 5 % 2 patches daily.   Marland Kitchen lisinopril (ZESTRIL) 40 MG tablet TAKE 1 TABLET(40 MG) BY MOUTH TWICE DAILY   . loperamide (IMODIUM) 2 MG capsule Take 2 mg by mouth as needed for diarrhea or loose stools.   . nitroGLYCERIN (NITROSTAT) 0.4 MG SL tablet Place 1 tablet (0.4 mg total) under the tongue every 5 (five) minutes as needed for chest pain.   . nortriptyline (PAMELOR) 50 MG capsule Take 1 capsule (50 mg total) by mouth at bedtime. (Patient not taking: Reported on 09/12/2020)   . nystatin ointment (MYCOSTATIN) Apply 1 application topically 2 (two) times daily.   Marland Kitchen omeprazole (PRILOSEC) 40 MG capsule  TAKE 1 CAPSULE BY MOUTH TWICE DAILY   . ondansetron (ZOFRAN) 4 MG tablet Take 1 tablet (4 mg total) by mouth every 6 (six) hours as needed for nausea or vomiting.   Marland Kitchen oxyCODONE-acetaminophen (PERCOCET/ROXICET) 5-325 MG tablet Take 1 tablet by mouth 4 (four) times daily as needed.   . pregabalin (LYRICA) 75 MG capsule 1 cap qHS for 1 week, then increase to 1 cap BID for 1 week, then 1 cap qAM and 2 caps qPM for 1 week, then 2 caps BID until you see me   . rosuvastatin (CRESTOR) 40 MG tablet Take 1 tablet (40 mg total) by mouth daily. 08/09/2020: Filled  & signed out 05/22/20 90 day per [place]. Tara [name] will request refill as soon as insurance allopws. Unknown if patient was taking  . traZODone (DESYREL) 50 MG tablet Take 1 tablet (50 mg total) by mouth at bedtime.   . triamcinolone ointment (KENALOG) 0.5 % Apply 1 application topically 2 (two) times daily. (Patient not taking: Reported on 09/12/2020)    Facility-Administered Encounter Medications as of 09/19/2020  Medication  . cyanocobalamin ((VITAMIN B-12)) injection 1,000 mcg    Patient Active Problem List   Diagnosis Date Noted  . 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 S/P stroke due to cerebrovascular disease 08/06/2020  . Polyneuropathy, unspecified 12/08/2019  . Difficulty sleeping 10/29/2019  . Mild cognitive impairment 10/29/2019  . Right leg numbness 10/29/2019  . Lymphedema 10/26/2019  . Atherosclerosis of native arteries of extremity with intermittent claudication ([place]) 10/26/2019  . Nerve pain 10/12/2019  . Memory loss 10/12/2019  . PAD (peripheral artery disease) ([place]) 08/02/2019  . Temporal arteritis ([place])   . Other intervertebral disc degeneration, thoracic region 06/21/2019  . GERD (gastroesophageal reflux disease) 06/21/2019  . Angiodysplasia of intestinal tract   . Intestinal bypass or anastomosis status   . History of aortic valve replacement 02/07/2019  . B12 deficiency 02/07/2019  . Anxiety 02/05/2019  . HTN (hypertension) 02/05/2019  . Hyperlipidemia 02/05/2019  . IBS (irritable bowel syndrome) 02/05/2019  . Arthritis of both knees 02/05/2019  . CTS (carpal tunnel syndrome) 02/05/2019    Conditions to be addressed/monitored:HTN, HLD, Anxiety, Depression and history of Strokes and blindness  Care Plan : RNCM: HLD management  Updates made by Vanita Ingles since 09/19/2020 12:00 AM    Problem: RNCM: HLD management   Priority: Medium    Long-Range Goal: RNCM: HLD Coping Skills Enhanced   Priority: Medium  Note:    Current Barriers:  . Poorly controlled hyperlipidemia, complicated by smoker, memory changes, several chronic conditions  . Current antihyperlipidemic regimen: Is unsure about the medications, referral in for pharmacy support and pcp recommendations  . Most recent lipid panel:  . Lab Results .  Component . Value . Date .   Marland Kitchen CHOL . 113 . 09/12/2020 .   Marland Kitchen HDL . 44 . 09/12/2020 .   Marland Kitchen [place] . 50 . 09/12/2020 .   Marland Kitchen LDLDIRECT . 145.7 (H) . 07/13/2019 .   Marland Kitchen TRIG . 103 . 09/12/2020 .   Marland Kitchen CHOLHDL . 5.5 . 07/13/2019 .    Marland Kitchen ASCVD risk enhancing conditions: age >29, HTN, current smoker . Unable to independently manage HLD . Unable to self administer medications as prescribed . Does not attend all scheduled provider appointments . Does not adhere to prescribed medication regimen . Lacks social connections . Unable to perform ADLs independently . Unable to perform IADLs independently . Does not maintain  contact with provider office . Does not contact provider office for questions/concerns  RN Care Manager Clinical Goal(s):  Marland Kitchen Over the next 120 days, patient will work with Consulting civil engineer, providers, and care team towards execution of optimized self-health management plan . Over the next 120 days, patient will verbalize understanding of plan for effective management of HLD . Over the next 120 days, patient will work with [facility], [place] team and pcp to address needs related to HLD management   Interventions: . Collaboration with Valerie Roys, DO regarding development and update of comprehensive plan of care as evidenced by provider attestation and co-signature . Inter-disciplinary care team collaboration (see longitudinal plan of care) . Medication review performed; medication list updated in electronic medical record. 09-19-2020: Reviewed  . Inter-disciplinary care team collaboration (see longitudinal plan of care) . Referred to pharmacy team for assistance with HLD medication  management . Evaluation of current treatment plan related to HLD and patient's adherence to plan as established by provider. 09-19-2020: The patients daughter states that the patient is eating well and she is monitoring her intake.  . Advised patient to call the office for changes in conditions or questions. 09-19-2020: Review and reinforced  . Provided education to patient re: working with CCM team, referrals in place, and collaboration with pcp . Discussed plans with patient for ongoing care management follow up and provided patient with direct contact information for care management team . Next appointment with pcp on 12-11-2020 at 3 pm   Patient Goals/Self-Care Activities: . Over the next 120 days, patient will:   - call for medicine refill 2 or 3 days before it runs out - call if I am sick and can't take my medicine - keep a list of all the medicines I take; vitamins and herbals too - learn to read medicine labels - use a pillbox to sort medicine- 09-19-2020: The patients daughter Tara [name] is managing the patients medication - use an alarm clock or phone to remind me to take my medicine - change to whole grain breads, cereal, pasta - drink 6 to 8 glasses of water each day - eat 3 to 5 servings of fruits and vegetables each day - eat 5 or 6 small meals each day - fill half the plate with nonstarchy vegetables - limit fast food meals to no more than 1 per week - manage portion size - prepare main meal at home 3 to 5 days each week - read food labels for fat, fiber, carbohydrates and portion size - be open to making changes - I can manage, know and watch for signs of a heart attack - if I have chest pain, call for help - learn about small changes that will make a big difference - learn my personal risk factors  - active listening utilized - caregiver stress acknowledged - counseling provided - current coping strategies identified - decision-making supported - healthy lifestyle promoted -  meditative movement therapy encouraged - mindfulness encouraged - participation in counseling encouraged - problem-solving facilitated - self-reflection promoted - spiritual activities promoted - verbalization of feelings encouraged  Follow Up Plan: Telephone follow up appointment with care management team member scheduled for: 11-14-2020 at 0945 am     Problem: RNCM: Vision Changes Quality of Life (General Plan of Care)   Priority: High    Goal: RNCM: Vision Changes Quality of Life Maintained   Priority: High  Note:   Current Barriers:  Marland Kitchen Knowledge Deficits related to vision changes due to  CVA . Care Coordination needs related to resources in the community  in a patient with changes in vision and loss of vision  . Lacks caregiver support.  . Film/video editor.  . Transportation barriers . Cognitive Deficits . Unable to independently manage care due to changes in vision . Unable to self administer medications as prescribed . Does not attend all scheduled provider appointments . Does not adhere to prescribed medication regimen . Lacks social connections . Unable to perform ADLs independently . Unable to perform IADLs independently . Does not maintain contact with provider office . Does not contact provider office for questions/concerns  Nurse Case Manager Clinical Goal(s):  Marland Kitchen Over the next 120 days, patient will verbalize understanding of plan for effective management of meeting needs in patient with vision changes  . Over the next 120 days, patient will work with [facility], [place] team, and specialist  to address needs related to vision loss  . Over the next 120 days, patient will attend all scheduled medical appointments: eye specialist on 08-10-2020, appointment completed. The patient declared legally blind Interventions:  . 1:1 collaboration with Valerie Roys, DO regarding development and update of comprehensive plan of care as evidenced by provider attestation and  co-signature . Inter-disciplinary care team collaboration (see longitudinal plan of care) . Evaluation of current treatment plan related to loss of vision  and patient's adherence to plan as established by provider. 08-16-2020: Saw eye specialist last week. Was diagnosed with cortical blindness. The patient if not handling well, but has support from her daughter. 09-19-2020: The daughter states things are about the same. They are adjusting to the new diagnosis and are taking things a day at a time.  . Advised patient to keep appointment with eye doctor, call office for changes in condition  . Provided education to patient re: services for the blind and working with resources to help with patients expressed needs due to vision loss. 08-16-2020: Review of resources available for services for the blind. This is all new to the daughter and the patient. Will continue to monitor for changes and needs. 09-19-2020: The daughter has resources. Also sending text message so the daughter can sign up for my chart to view notes and records. Text message sent to [phone]. Education provided for the daughter.   . Discussed plans with patient for ongoing care management follow up and provided patient with direct contact information for care management team . Reviewed scheduled/upcoming provider appointments including: eye specialist on 08-15-2020 at 0900 am. 08-16-2020: Completed this appointment and will see pcp on 12-11-2020 at 3 pm, saw pcp on 09-12-2020.    Patient Goals/Self-Care Activities Over the next 120 days, patient will:  - Patient will self administer medications as prescribed Patient will attend all scheduled provider appointments Patient will call pharmacy for medication refills Patient will continue to perform ADL's independently Patient will continue to perform IADL's independently Patient will call provider office for new concerns or questions Patient will work with BSW to address care coordination needs  and will continue to work with the clinical team to address health care and disease management related needs.    - affirmation provided - community involvement promoted - expression of thoughts about present/future encouraged - independence in all possible areas promoted - patient strengths promoted - self-expression encouraged - sleep hygiene techniques encouraged - social relationships promoted Follow Up Plan: Telephone follow up appointment with care management team member scheduled for: 11-14-2020 at 0945 am  Care Plan : RNCM: Stroke (Adult)  Updates made by Vanita Ingles since 09/19/2020 12:00 AM    Problem: RNCM: Emotional Adjustment to Disease (Stroke)   Priority: High    Goal: RNCM: Effective management of stroke   Priority: High  Note:   Current Barriers:  Marland Kitchen Knowledge Deficits related to effective management of s/p stroke and TIas . 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 Chronic Disease Management support and education needs related to management of stroke and prevention of new HA and/orstroke  . Lacks caregiver support.  . Film/video editor.  . Transportation barriers . Cognitive Deficits . Unable to independently manage care due to "mini-strokes" and TIAs . Unable to self administer medications as prescribed . Lacks social connections . Unable to perform ADLs independently . Unable to perform IADLs independently . Does not contact provider office for questions/concerns  Nurse Case Manager Clinical Goal(s):  Marland Kitchen Over the next 120 days, patient will verbalize understanding of plan for effective management of CVA and residual effects from TIAs  . Over the next 120 days, patient will work with [facility], CCM team and pcp to address needs related to **care of patient in the home with post residual deficits and decline in condition . Over the  next 120 days, patient will demonstrate a decrease in TIA episodes  exacerbations as evidenced by no new CVA events  . Over the next 120 days, patient will work with CM team pharmacist to reconcile medications, polypharmacy and dubplications  . Over the next 120 days, patient will work with care guides  (community agency) to help with expressed needs: transportation, food resources, and community resources   Interventions:  . 1:1 collaboration with Valerie Roys, DO regarding development and update of comprehensive plan of care as evidenced by provider attestation and co-signature . Inter-disciplinary care team collaboration (see longitudinal plan of care) . Provided education to patient re: talking to care guides about resources, calling insurance to inquire about life alert, working with CCM team to meet expressed needs  . Reviewed medications with patient and discussed compliance and pharmacy referral. 08-16-2020: The pharmacist has reviewed with the daughter medications list and the daughter is more at ease with medications. The pharmacist will continue to work with the patients daughter. 09-19-2020: The daughter Tara [name] denies any medication concerns today.  Nash Dimmer with pcp and pharmacy  regarding medication question and concerns. 09-19-2020: The patients daughter is managing medications and has not new questions at this time. Will continue to follow. . Discussed plans with patient for ongoing care management follow up and provided patient with direct contact information for care management team . Care Guide referral for food, transportation, and community resources  . Social Work referral for needs in the home, placement questions- currently working with LCSW . Pharmacy referral for polypharmacy, medication reconciliation, duplicate medications   Patient Goals/Self-Care Activities Over the next 120 days, patient will:  - Patient will self administer medications as prescribed Patient will  attend all scheduled provider appointments Patient will call pharmacy for medication refills Patient will continue to perform ADL's independently Patient will continue to perform IADL's independently Patient will call provider office for new concerns or questions Patient will work with BSW to address care coordination needs and will continue to work with the clinical team to address health care and disease management related needs.   - caregiver  stress acknowledged - caregiver support provided - counseling provided - decision-making supported - depression screen reviewed - family care conference arranged - financial counseling provided - goal-setting facilitated - positive reinforcement provided - problem-solving facilitated - self-care encouraged - self-reflection promoted - verbalization of feelings encouraged   Follow Up Plan: Telephone follow up appointment with care management team member scheduled for: 11-14-2020 0945 am       Care Plan : RNCM: Hypertension (Adult)  Updates made by Vanita Ingles since 09/19/2020 12:00 AM    Problem: RNCM: Hypertension (Hypertension)   Priority: Medium    Goal: RNCM: Hypertension Monitored   Priority: Medium  Note:   Objective:  . Last practice recorded BP readings:  . BP Readings from Last 3 Encounters: .  09/12/20 . 135/77 .  08/04/20 . 135/65 .  05/01/20 . (!) 150/90 .    Marland Kitchen Most recent eGFR/CrCl: No results found for: EGFR  No components found for: CRCL Current Barriers:  Marland Kitchen Knowledge Deficits related to basic understanding of hypertension pathophysiology and self care management . Knowledge Deficits related to understanding of medications prescribed for management of hypertension . Non-adherence to prescribed medication regimen . Non-adherence to scheduled provider appointments . Transportation barriers . Cognitive Deficits . Limited Social Support . Film/video editor.  . Unable to independently manage HTN . Unable to  self administer medications as prescribed . Does not attend all scheduled provider appointments . Does not adhere to prescribed medication regimen . Lacks social connections . Unable to perform ADLs independently . Unable to perform IADLs independently . Does not maintain contact with provider office . Does not contact provider office for questions/concerns Case Manager Clinical Goal(s):  Marland Kitchen Over the next 120 days, patient will verbalize understanding of plan for hypertension management . Over the next 120 days, patient will attend all scheduled medical appointments: will see pcp in the next few weeks. Knows to call for an appointment  . Over the next 120 days, patient will demonstrate improved adherence to prescribed treatment plan for hypertension as evidenced by taking all medications as prescribed, monitoring and recording blood pressure as directed, adhering to low sodium/DASH diet . Over the next 120 days, patient will demonstrate improved health management independence as evidenced by checking blood pressure as directed and notifying PCP if SBP>160 or DBP > 90, taking all medications as prescribe, and adhering to a low sodium diet as discussed. Interventions:  . Collaboration with Valerie Roys, DO regarding development and update of comprehensive plan of care as evidenced by provider attestation and co-signature . Inter-disciplinary care team collaboration (see longitudinal plan of care) . UNABLE to independently:manage HTN . Evaluation of current treatment plan related to hypertension self management and patient's adherence to plan as established by provider. 08-16-2020: Home health is coming into the home now working with the patient. Nurse has been there, pt working with the patient. OT ordered by home health provider. 09-19-2020: The patients daughter states that it is a day to day situation. She says things are about the same.  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 The patients daughter  says she is doing what she needs to do to care for the patient. She is still having the sinusitis issue and is waiting to hear from the ENT for follow up. Will continue to monitor for changes.  . Provided education to patient re: stroke prevention, s/s of heart attack and stroke, DASH diet, complications of uncontrolled blood pressure . Reviewed medications with patient and discussed importance  of compliance. 08-16-2020: The daughter is more at ease about the patients medications at this time. 09-19-2020: The patients daughter is managing medications for the patient. She is compliant with the current regimen.  . Discussed plans with patient for ongoing care management follow up and provided patient with direct contact information for care management team . Advised patient, providing education and rationale, to monitor blood pressure daily and record, calling PCP for findings outside established parameters.  Patient Goals/Self-Care Activities . Over the next 120 days, patient will:  - UNABLE to independently manage HTN Self administers medications as prescribed Attends all scheduled provider appointments Calls provider office for new concerns, questions, or BP outside discussed parameters Checks BP and records as discussed Follows a low sodium diet/DASH diet- compliant with dietary restrictions  - blood pressure trends reviewed - depression screen reviewed - home or ambulatory blood pressure monitoring encouraged Follow Up Plan: Telephone follow up appointment with care management team member scheduled for: 11-14-2020 at 0945 am   Care Plan : RNCM: Depression (Adult)  Updates made by Vanita Ingles since 09/19/2020 12:00 AM    Problem: RNCM: Depression Identification (Depression)   Priority: Medium    Goal: RNCM: Depressive Symptoms Identified   Priority: Medium  Note:   Current Barriers:  Marland Kitchen Knowledge Deficits related to depression and anxiety  . Chronic Disease Management support and education  needs related to depression and anxiety  . Lacks caregiver support.  . Film/video editor.  . Transportation barriers . Non-adherence to scheduled provider appointments . Non-adherence to prescribed medication regimen . Unable to independently manage depression and anxiety  . Unable to self administer medications as prescribed . Does not attend all scheduled provider appointments . Does not adhere to prescribed medication regimen . Lacks social connections . Unable to perform ADLs independently . Unable to perform IADLs independently . Does not maintain contact with provider office . Does not contact provider office for questions/concerns  Nurse Case Manager Clinical Goal(s):  Marland Kitchen Over the next 120 days, patient will verbalize understanding of plan for management of depression and anxiety . Over the next 120 days, patient will work with [place], [place], and pcp to address needs related to depression and anxiety . Over the next 120 days, patient will attend all scheduled medical appointments: will call for appointment with pcp  Interventions:  . 1:1 collaboration with Valerie Roys, DO regarding development and update of comprehensive plan of care as evidenced by provider attestation and co-signature . Inter-disciplinary care team collaboration (see longitudinal plan of care) . Evaluation of current treatment plan related to anxiety and depression  and patient's adherence to plan as established by provider. 08-16-2020: Saw neurologist and eye specialist. 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 Will need CCM team support. 09-19-2020: Tara [name] states that they are taking it day by day. She has good days and not so good days. The daughter is with the patient 24/7, she can tell when she is having an off day. 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 The daughter knows to call for changes.  . Provided education to patient and/or caregiver about advanced directives . Provided education  to patient re: calling the office for changes in mood/anxiety/depression . Reviewed medications with patient and discussed compliance, poly pharmacy, and duplications- pharmacy referral  . Collaborated with RNCM, pcp, and CCM team regarding patients expressed needs  . Discussed plans with patient for ongoing care management follow up and provided patient with direct contact information for  care management team . Reviewed scheduled/upcoming provider appointments including: will see the pcp in the next few weeks, knows to call for changes.  Next appointment on 12-11-2020 at 3 pm  Patient Goals/Self-Care Activities Over the next 120 days, patient will:  - Patient will self administer medications as prescribed Patient will attend all scheduled provider appointments Patient will call pharmacy for medication refills Patient is dependent on daughter and others to help meet her ADLS/IADLS Patient will call provider office for new concerns or questions Patient will work with BSW to address care coordination needs and will continue to work with the clinical team to address health care and disease management related needs.    - anxiety screen reviewed - depression screen reviewed - medication list reviewed - participation in psychiatric services encouraged - substance use assessed - substance use risk screen reviewed Follow Up Plan: Telephone follow up appointment with care management team member scheduled for: 11-14-2020 0945 am         Plan:Telephone follow up appointment with care management team member scheduled for:  11-14-2020 at [place] am  Noreene Larsson RN, MSN, [facility] Mobile: [phone]

## 2020-10-02 ENCOUNTER — Ambulatory Visit: Payer: Federal, State, Local not specified - PPO | Admitting: Licensed Clinical Social Worker

## 2020-10-02 DIAGNOSIS — I10 Essential (primary) hypertension: Secondary | ICD-10-CM | POA: Diagnosis not present

## 2020-10-02 DIAGNOSIS — G3184 Mild cognitive impairment, so stated: Secondary | ICD-10-CM

## 2020-10-02 DIAGNOSIS — E782 Mixed hyperlipidemia: Secondary | ICD-10-CM

## 2020-10-02 DIAGNOSIS — F339 Major depressive disorder, recurrent, unspecified: Secondary | ICD-10-CM

## 2020-10-02 DIAGNOSIS — F419 Anxiety disorder, unspecified: Secondary | ICD-10-CM

## 2020-10-02 NOTE — Chronic Care Management (AMB) (Addendum)
Chronic Care Management    Clinical Social Work Note  10/02/2020 Name: Tara [name] MRN: [id number] DOB: 11-22-1945  Tara [name] is a 75 y.o. year old female who is a primary care patient of Valerie Roys, DO. The CCM team was consulted to assist the patient with chronic disease management and/or care coordination needs related to: Mental Health Counseling and Resources.   Collaboration with daughter for follow up visit in response to provider referral for social work chronic care management and care coordination services.   Consent to Services:  The patient was given the following information about Chronic Care Management services today, agreed to services, and gave verbal consent: 1. CCM service includes personalized support from designated clinical staff supervised by the primary care provider, including individualized plan of care and coordination with other care providers 2. 24/7 contact phone numbers for assistance for urgent and routine care needs. 3. Service will only be billed when office clinical staff spend 20 minutes or more in a month to coordinate care. 4. Only one practitioner may furnish and bill the service in a calendar month. 5.The patient may stop CCM services at any time (effective at the end of the month) by phone call to the office staff. 6. The patient will be responsible for cost sharing (co-pay) of up to 20% of the service fee (after annual deductible is met). Patient agreed to services and consent obtained.  Patient agreed to services and consent obtained.   Assessment: Review of patient past medical history, allergies, medications, and health status, including review of relevant consultants reports was performed today as part of a comprehensive evaluation and provision of chronic care management and care coordination services.     SDOH (Social Determinants of Health) assessments and interventions performed:    Advanced Directives Status: See Care Plan  for related entries.  CCM Care Plan  No Known Allergies  Outpatient Encounter Medications as of 10/02/2020  Medication Sig Note  . ALPRAZolam (XANAX) 0.5 MG tablet Take 1 tablet (0.5 mg total) by mouth daily as needed for anxiety.   Marland Kitchen amLODipine (NORVASC) 10 MG tablet Take 1 tablet (10 mg total) by mouth daily.   Marland Kitchen aspirin EC 81 MG tablet Take 1 tablet (81 mg total) by mouth daily.   . B-D TB SYRINGE 1CC/27GX1/2" 27G X 1/2" 1 ML MISC USE AS DIRECTED EVERY 30 DAYS   . citalopram (CELEXA) 40 MG tablet Take 1 tablet (40 mg total) by mouth daily.   . clopidogrel (PLAVIX) 75 MG tablet Take 1 tablet (75 mg total) by mouth daily.   . cyanocobalamin (,VITAMIN B-12,) 1000 MCG/ML injection ADMINISTER 1 ML(1000 MCG) IN THE MUSCLE EVERY 30 DAYS   . diclofenac Sodium (VOLTAREN) 1 % GEL APPLY 4 GRAMS EXTERNALLY TO THE AFFECTED AREA FOUR TIMES DAILY   . donepezil (ARICEPT) 5 MG tablet Take 5 mg by mouth daily. (Patient not taking: No sig reported)   . famotidine (PEPCID) 20 MG tablet TAKE 1 TABLET(20 MG) BY MOUTH AT BEDTIME   . fenofibrate micronized (LOFIBRA) 200 MG capsule TAKE 1 CAPSULE(200 MG) BY MOUTH DAILY   . Insulin Syringe 27G X 1/2" 1 ML MISC 1 each by Does not apply route every 30 (thirty) days.   . isosorbide mononitrate (IMDUR) 60 MG 24 hr tablet Take 1 tablet (60 mg total) by mouth in the morning and at bedtime. (Patient taking differently: Take 60 mg by mouth in the morning and at bedtime. Filled today. Will have delivered  tomorrow.)   . lamoTRIgine (LAMICTAL) 25 MG tablet Take by mouth.   . lidocaine (LIDODERM) 5 % 2 patches daily.   Marland Kitchen lisinopril (ZESTRIL) 40 MG tablet TAKE 1 TABLET(40 MG) BY MOUTH TWICE DAILY   . loperamide (IMODIUM) 2 MG capsule Take 2 mg by mouth as needed for diarrhea or loose stools.   . nitroGLYCERIN (NITROSTAT) 0.4 MG SL tablet Place 1 tablet (0.4 mg total) under the tongue every 5 (five) minutes as needed for chest pain.   . nortriptyline (PAMELOR) 50 MG capsule  Take 1 capsule (50 mg total) by mouth at bedtime. (Patient not taking: Reported on 09/12/2020)   . nystatin ointment (MYCOSTATIN) Apply 1 application topically 2 (two) times daily.   Marland Kitchen omeprazole (PRILOSEC) 40 MG capsule TAKE 1 CAPSULE BY MOUTH TWICE DAILY   . ondansetron (ZOFRAN) 4 MG tablet Take 1 tablet (4 mg total) by mouth every 6 (six) hours as needed for nausea or vomiting.   Marland Kitchen oxyCODONE-acetaminophen (PERCOCET/ROXICET) 5-325 MG tablet Take 1 tablet by mouth 4 (four) times daily as needed.   . pregabalin (LYRICA) 75 MG capsule 1 cap qHS for 1 week, then increase to 1 cap BID for 1 week, then 1 cap qAM and 2 caps qPM for 1 week, then 2 caps BID until you see me   . rosuvastatin (CRESTOR) 40 MG tablet Take 1 tablet (40 mg total) by mouth daily. 08/09/2020: Filled & signed out 05/22/20 90 day per [place]. [name] will request refill as soon as insurance allopws. Unknown if patient was taking  . traZODone (DESYREL) 50 MG tablet Take 1 tablet (50 mg total) by mouth at bedtime.   . triamcinolone ointment (KENALOG) 0.5 % Apply 1 application topically 2 (two) times daily. (Patient not taking: Reported on 09/12/2020)    Facility-Administered Encounter Medications as of 10/02/2020  Medication  . cyanocobalamin ((VITAMIN B-12)) injection 1,000 mcg    Patient Active Problem List   Diagnosis Date Noted  . 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 S/P stroke due to cerebrovascular disease 08/06/2020  . Polyneuropathy, unspecified 12/08/2019  . Difficulty sleeping 10/29/2019  . Mild cognitive impairment 10/29/2019  . Right leg numbness 10/29/2019  . Lymphedema 10/26/2019  . Atherosclerosis of native arteries of extremity with intermittent claudication ([place]) 10/26/2019  . Nerve pain 10/12/2019  . Memory loss 10/12/2019  . PAD (peripheral artery disease) ([place]) 08/02/2019  . Temporal arteritis ([place])   . Other intervertebral disc degeneration, thoracic region  06/21/2019  . GERD (gastroesophageal reflux disease) 06/21/2019  . Angiodysplasia of intestinal tract   . Intestinal bypass or anastomosis status   . History of aortic valve replacement 02/07/2019  . B12 deficiency 02/07/2019  . Anxiety 02/05/2019  . HTN (hypertension) 02/05/2019  . Hyperlipidemia 02/05/2019  . IBS (irritable bowel syndrome) 02/05/2019  . Arthritis of both knees 02/05/2019  . CTS (carpal tunnel syndrome) 02/05/2019    Conditions to be addressed/monitored: Anxiety and Depression; Mental Health Concerns   Care Plan : General Social Work (Adult)  Updates made by Greg Cutter, LCSW since 10/02/2020 12:00 AM    Problem: Coping Skills (General Plan of Care)     Long-Range Goal: Coping Skills Enhanced   Start Date: 07/31/2020  Priority: Medium  Note:     Timeframe:  Long-Range Goal Priority:  Medium Start Date: 10/02/20  Expected End Date: 01/02/21                   Follow Up Date -90 days from 10/02/20   - check out options for in-home help, long-term care or hospice - complete a living will - discuss my treatment options with the doctor or nurse - do one enjoyable thing every day - do something different, like talking to a new person or going to a new place, every day - learn something new by asking, reading and searching the Internet every day - make an audio or video recording for my loved ones - make shared treatment decisions with doctor - meditate daily - name a health care proxy (decision maker) - share memories using a picture album or scrapbook with my loved ones - spend time with a child every day, borrow one if I have to - spend time outdoors at least 3 times a week - strengthen or fix relationships with loved ones    Why is this important?    Having a long-term illness can be scary.   It can also be stressful for you and your caregiver.   These steps may help.    Assessment, press & current barriers:  Patient unable  to consistently perform activities of daily living and needs additional assistance and support in order to meet this unmet need . Limited social support, Level of care concerns, ADL IADL limitations, Social Isolation, Limited access to caregiver, Cognitive Deficits, Memory Deficits, and Blind . Lacks social connections  Clinical Goals:   Over the next 120 days, patient will have additional in home support education provided to both patient and daughter (caregiver) in order to increase support and safety within the home Over the next 120 days, patient/caregiver will work with SW to address concerns related to care coordination needs, lack of a stable support network and lack of Brewing technologist. LCSW will assist patient in gaining additional support in order to maintain health and mental health appropriately  Over the next 120 days, patient will demonstrate improved adherence to self care as evidenced by implementing healthy self-care into her daily routine such as: attending all medical appointments, deep breathing exercises, taking time for self-reflection, taking medications as prescribed, drinking water and daily exercise to improve mobility and mood.  Over the next 120 days, patient will demonstrate improved health management independence as evidenced by implementing healthy self-care skills and positive support/resource implementation into their daily routine to help cope with stressors and improve overall health and well-being  Over the next 120 days, patient or caregiver will verbalize basic understanding of depression/stress process and self health management plan as evidenced by her participation in development of long term plan of care and institution of self health management strategies  Interventions : . Assessed needs, level of care concerns, basic eligibility and provided education on in home support resources . Reviewed community support options (PCS, CHORE,  [facility], CAP, private pay, PACE program) . Collaborate with primary care provider per patient request. Patient's daughter reports that patient is doing well and stable for now. . Patient is now going to a pain clinic and on pain medication which contributed to her having to discontinue xanax. Daughter reports NO withdrawal symptoms with this transition as patient only took her xanax as needed.   . Provided family with community resources for the blind. DSS Social Worker of the Blind- Phone: [phone]. The [place] Division of Services for the Blind (DSB) provides services to  people who are blind or visually impaired to help them reach their goals of independence and employment-[phone]. Cleora Fleet, Adaptive and Inclusive Recreation Coordinator-[phone]. These resources and other PCS resources were sent to daughter's email on 07/31/20.  . Patient reports that her memory loss has increased since her most recent stroke. She reports ongoing issues with finding her words in order to communicate. . Patient reports that her xanax medication continues to help her manage her anxiety and panic.  Marland Kitchen Patient's spouse is also in need of a caregiver as his last aide through the [place] ended her services. Family is currently working with [facility] social worker to get these services started again.  . Patient's family was unaware of their upcoming appointment with NP Laurann Montana on 10/10/20. Family was unaware of this referral. This is a telephonic visit but daughter is agreeable to contact this provider to question reason for visit. Contact information provided to family.  . Patient reports that her main socialization comes from talking over the phone to her sister, friend and cousin.  . [employer] continues to deliver food. Daughter lives with patient and is able to go to the door if they do not leave the meal out (they have a sign to drop meals off at front door) . Patient interviewed and appropriate  assessments performed . Discussed plans with patient for ongoing care management follow up and provided patient with direct contact information for care management team . Assisted patient/caregiver with obtaining information about health plan benefits . Provided education and assistance to client regarding Advanced Directives. . Provided education to patient/caregiver regarding level of care options. . Provided education to patient/caregiver about Hospice and/or Palliative Care services . Other interventions provided: Solution-Focused Strategies Patient Goals/Self-Care Activities: Over the next 120 days . Review resources provided  . Call agencies listed in email that was sent out on 07/31/20.      Follow Up Plan: SW will follow up with patient by phone over the next quarter      Eula Fried, BSW, MSW, [email or web address] Phone: [phone]

## 2020-10-02 NOTE — Patient Instructions (Signed)
Licensed Clinical Social Worker Visit Information  Goals we discussed today:  Goals Addressed            This Visit's Progress   . SW-Matintain My Quality of Life       Timeframe:  Long-Range Goal Priority:  Medium Start Date: 10/02/20                          Expected End Date: 01/02/21                   Follow Up Date -90 days from 10/02/20   - check out options for in-home help, long-term care or hospice - complete a living will - discuss my treatment options with the doctor or nurse - do one enjoyable thing every day - do something different, like talking to a new person or going to a new place, every day - learn something new by asking, reading and searching the Internet every day - make an audio or video recording for my loved ones - make shared treatment decisions with doctor - meditate daily - name a health care proxy (decision maker) - share memories using a picture album or scrapbook with my loved ones - spend time with a child every day, borrow one if I have to - spend time outdoors at least 3 times a week - strengthen or fix relationships with loved ones    Why is this important?    Having a long-term illness can be scary.   It can also be stressful for you and your caregiver.   These steps may help.    Assessment, press & current barriers:  Patient unable to consistently perform activities of daily living and needs additional assistance and support in order to meet this unmet need . Limited social support, Level of care concerns, ADL IADL limitations, Social Isolation, Limited access to caregiver, Cognitive Deficits, Memory Deficits, and Blind . Lacks social connections  Clinical Goals:   Over the next 120 days, patient will have additional in home support education provided to both patient and daughter (caregiver) in order to increase support and safety within the home Over the next 120 days, patient/caregiver will work with SW to address concerns related to  care coordination needs, lack of a stable support network and lack of Economist. LCSW will assist patient in gaining additional support in order to maintain health and mental health appropriately  Over the next 120 days, patient will demonstrate improved adherence to self care as evidenced by implementing healthy self-care into her daily routine such as: attending all medical appointments, deep breathing exercises, taking time for self-reflection, taking medications as prescribed, drinking water and daily exercise to improve mobility and mood.  Over the next 120 days, patient will demonstrate improved health management independence as evidenced by implementing healthy self-care skills and positive support/resource implementation into their daily routine to help cope with stressors and improve overall health and well-being  Over the next 120 days, patient or caregiver will verbalize basic understanding of depression/stress process and self health management plan as evidenced by her participation in development of long term plan of care and institution of self health management strategies  Interventions : . Assessed needs, level of care concerns, basic eligibility and provided education on in home support resources . Reviewed community support options (PCS, CHORE, [facility], CAP, private pay, PACE program) . Collaborate with primary care provider per patient request. Patient's daughter reports that  patient is doing well and stable for now. . Patient is now going to a pain clinic and on pain medication which contributed to her having to discontinue xanax. Daughter reports NO withdrawal symptoms with this transition as patient only took her xanax as needed.   . Provided family with community resources for the blind. DSS Social Worker of the Blind- Phone: [phone]. The [place] Division of Services for the Blind (DSB) provides services to people who are blind or visually  impaired to help them reach their goals of independence and employment-[phone]. Lurena Joiner, [address] and Inclusive Recreation Coordinator-[phone]. These resources and other PCS resources were sent to daughter's email on 07/31/20.  . Patient reports that her memory loss has increased since her most recent stroke. She reports ongoing issues with finding her words in order to communicate. . Patient reports that her xanax medication continues to help her manage her anxiety and panic.  Marland Kitchen Patient's spouse is also in need of a caregiver as his last aide through the [place] ended her services. [facility] to get these services started again.  . Patient's family was unaware of their upcoming appointment with NP Gillian Shields on 10/10/20. Family was unaware of this referral. This is a telephonic visit but daughter is agreeable to contact this provider to question reason for visit. Contact information provided to family.  . Patient reports that her main socialization comes from talking over the phone to her sister, friend and cousin.  . Patient interviewed and appropriate assessments performed . Discussed plans with patient for ongoing care management follow up and provided patient with direct contact information for care management team . Assisted patient/caregiver with obtaining information about health plan benefits . Provided education and assistance to client regarding Advanced Directives. . Provided education to patient/caregiver regarding level of care options. . Provided education to patient/caregiver about Hospice and/or Palliative Care services . Other interventions provided: Solution-Focused Strategies Patient Goals/Self-Care Activities: Over the next 120 days . Review resources provided  . Call agencies listed in email that was sent out on 07/31/20.

## 2020-10-10 ENCOUNTER — Other Ambulatory Visit: Payer: Self-pay

## 2020-10-10 ENCOUNTER — Telehealth: Payer: Self-pay

## 2020-10-10 ENCOUNTER — Telehealth (INDEPENDENT_AMBULATORY_CARE_PROVIDER_SITE_OTHER): Payer: Federal, State, Local not specified - PPO | Admitting: Family

## 2020-10-10 ENCOUNTER — Encounter: Payer: Self-pay | Admitting: Family

## 2020-10-10 VITALS — BP 130/70 | Ht 64.0 in | Wt 172.0 lb

## 2020-10-10 DIAGNOSIS — I10 Essential (primary) hypertension: Secondary | ICD-10-CM | POA: Diagnosis not present

## 2020-10-10 DIAGNOSIS — I25118 Atherosclerotic heart disease of native coronary artery with other forms of angina pectoris: Secondary | ICD-10-CM

## 2020-10-10 DIAGNOSIS — Z8673 Personal history of transient ischemic attack (TIA), and cerebral infarction without residual deficits: Secondary | ICD-10-CM

## 2020-10-10 DIAGNOSIS — G459 Transient cerebral ischemic attack, unspecified: Secondary | ICD-10-CM

## 2020-10-10 DIAGNOSIS — E785 Hyperlipidemia, unspecified: Secondary | ICD-10-CM

## 2020-10-10 MED ORDER — ROSUVASTATIN CALCIUM 40 MG PO TABS: 40.0000 mg | ORAL_TABLET | Freq: Every day | ORAL | 3 refills | Status: AC

## 2020-10-10 NOTE — Patient Instructions (Signed)
Medication Instructions:  Continue your current medications.   *If you need a refill on your cardiac medications before your next appointment, please call your pharmacy*   Lab Work: None ordered today.   Testing/Procedures: None ordered today.  Your previous ZIO monitor, echocardiogram, and stress test were reassuring.   Follow-Up: At [facility], you and your health needs are our priority.  As part of our continuing mission to provide you with exceptional heart care, we have created designated Provider Care Teams.  These Care Teams include your primary Cardiologist (physician) and Advanced Practice Providers (APPs -  Physician Assistants and Nurse Practitioners) who all work together to provide you with the care you need, when you need it.  We recommend signing up for the patient portal called "MyChart".  Sign up information is provided on this After Visit Summary.  MyChart is used to connect with patients for Virtual Visits (Telemedicine).  Patients are able to view lab/test results, encounter notes, upcoming appointments, etc.  Non-urgent messages can be sent to your provider as well.   To learn more about what you can do with MyChart, go to [email or web address].    Your next appointment:   4 month(s)  The format for your next appointment:   In Person  Provider:   Lorine Bears, MD  Other Instructions  Below are your recent cholesterol labs from 09/12/20. The results looked great! Continue your Rosuvastatin (Crestor) 40mg  daily. A refill was sent to [facility]. These low numbers help prevent stroke and prevent plaque buildup in the arteries of your legs and heart.     Type of cholesterol Labs  Goal  Total 113 Less than 200  HDL 44 More than 40  LDL 50 Less than 70  Triglycerides 103 Less than 150    Your recent lab work 09/12/20 with your primary care provider showed normal kidney function, liver function, thyroid function, and normal blood counts.   Your recent lab  work 09/12/20 showed a Hemoglobin A1c on 6.2 which is in the pre-diabetic range.    Prediabetes Eating Plan Prediabetes is a condition that causes blood sugar (glucose) levels to be higher than normal. This increases the risk for developing type 2 diabetes (type 2 diabetes mellitus). Working with a health care provider or nutrition specialist (dietitian) to make diet and lifestyle changes can help prevent the onset of diabetes. These changes may help you:  Control your blood glucose levels.  Improve your cholesterol levels.  Manage your blood pressure. What are tips for following this plan? Reading food labels  Read food labels to check the amount of fat, salt (sodium), and sugar in prepackaged foods. Avoid foods that have: ? Saturated fats. ? Trans fats. ? Added sugars.  Avoid foods that have more than 300 milligrams (mg) of sodium per serving. Limit your sodium intake to less than 2,300 mg each day. Shopping  Avoid buying pre-made and processed foods.  Avoid buying drinks with added sugar. Cooking  Cook with olive oil. Do not use butter, lard, or ghee.  Bake, broil, grill, steam, or boil foods. Avoid frying. Meal planning  Work with your dietitian to create an eating plan that is right for you. This may include tracking how many calories you take in each day. Use a food diary, notebook, or mobile application to track what you eat at each meal.  Consider following a Mediterranean diet. This includes: ? Eating several servings of fresh fruits and vegetables each day. ? Eating fish at least twice  a week. ? Eating one serving each day of whole grains, beans, nuts, and seeds. ? Using olive oil instead of other fats. ? Limiting alcohol. ? Limiting red meat. ? Using nonfat or low-fat dairy products.  Consider following a plant-based diet. This includes dietary choices that focus on eating mostly vegetables and fruit, grains, beans, nuts, and seeds.  If you have high blood  pressure, you may need to limit your sodium intake or follow a diet such as the DASH (Dietary Approaches to Stop Hypertension) eating plan. The DASH diet aims to lower high blood pressure.   Lifestyle  Set weight loss goals with help from your health care team. It is recommended that most people with prediabetes lose 7% of their body weight.  Exercise for at least 30 minutes 5 or more days a week.  Attend a support group or seek support from a mental health counselor.  Take over-the-counter and prescription medicines only as told by your health care provider. What foods are recommended? Fruits Berries. Bananas. Apples. Oranges. Grapes. Papaya. Mango. Pomegranate. Kiwi. Grapefruit. Cherries. Vegetables Lettuce. Spinach. Peas. Beets. Cauliflower. Cabbage. Broccoli. Carrots. Tomatoes. Squash. Eggplant. Herbs. Peppers. Onions. Cucumbers. Brussels sprouts. Grains Whole grains, such as whole-wheat or whole-grain breads, crackers, cereals, and pasta. Unsweetened oatmeal. Bulgur. Barley. Quinoa. Brown rice. Corn or whole-wheat flour tortillas or taco shells. Meats and other proteins Seafood. Poultry without skin. Lean cuts of pork and beef. Tofu. Eggs. Nuts. Beans. Dairy Low-fat or fat-free dairy products, such as yogurt, cottage cheese, and cheese. Beverages Water. Tea. Coffee. Sugar-free or diet soda. Seltzer water. Low-fat or nonfat milk. Milk alternatives, such as soy or almond milk. Fats and oils Olive oil. Canola oil. Sunflower oil. Grapeseed oil. Avocado. Walnuts. Sweets and desserts Sugar-free or low-fat pudding. Sugar-free or low-fat ice cream and other frozen treats. Seasonings and condiments Herbs. Sodium-free spices. Mustard. Relish. Low-salt, low-sugar ketchup. Low-salt, low-sugar barbecue sauce. Low-fat or fat-free mayonnaise. The items listed above may not be a complete list of recommended foods and beverages. Contact a dietitian for more information. What foods are not  recommended? Fruits Fruits canned with syrup. Vegetables Canned vegetables. Frozen vegetables with butter or cream sauce. Grains Refined white flour and flour products, such as bread, pasta, snack foods, and cereals. Meats and other proteins Fatty cuts of meat. Poultry with skin. Breaded or fried meat. Processed meats. Dairy Full-fat yogurt, cheese, or milk. Beverages Sweetened drinks, such as iced tea and soda. Fats and oils Butter. Lard. Ghee. Sweets and desserts Baked goods, such as cake, cupcakes, pastries, cookies, and cheesecake. Seasonings and condiments Spice mixes with added salt. Ketchup. Barbecue sauce. Mayonnaise. The items listed above may not be a complete list of foods and beverages that are not recommended. Contact a dietitian for more information. Where to find more information  American Diabetes Association: www.diabetes.org Summary  You may need to make diet and lifestyle changes to help prevent the onset of diabetes. These changes can help you control blood sugar, improve cholesterol levels, and manage blood pressure.  Set weight loss goals with help from your health care team. It is recommended that most people with prediabetes lose 7% of their body weight.  Consider following a Mediterranean diet. This includes eating plenty of fresh fruits and vegetables, whole grains, beans, nuts, seeds, fish, and low-fat dairy, and using olive oil instead of other fats. This information is not intended to replace advice given to you by your health care provider. Make sure you discuss any questions you  have with your health care provider. Document Revised: 10/07/2019 Document Reviewed: 10/07/2019 Elsevier Patient Education  2021 [employer].

## 2020-10-10 NOTE — Telephone Encounter (Signed)
Spoke with patient and she has questions regarding her A1c level. Patient was informed that her level was a 6.2 at her last visit. Patient verbalized understanding and has no further questions at this time.

## 2020-10-10 NOTE — Telephone Encounter (Signed)
Copied from CRM [id number]. Topic: Quick Communication - Lab Results (Clinic Use ONLY) >> Oct 10, 2020  8:52 AM Leafy Ro wrote: Pt had blood work on 09-12-2020 and states she did not get a call concerning her blood work results

## 2020-10-10 NOTE — Progress Notes (Signed)
Virtual Visit via Telephone Note   This visit type was conducted due to national recommendations for restrictions regarding the COVID-19 Pandemic (e.g. social distancing) in an effort to limit this patient's exposure and mitigate transmission in our community.  Due to her co-morbid illnesses, this patient is at least at moderate risk for complications without adequate follow up.  This format is felt to be most appropriate for this patient at this time.  The patient did not have access to video technology/had technical difficulties with video requiring transitioning to audio format only (telephone).  All issues noted in this document were discussed and addressed.  No physical exam could be performed with this format.  Please refer to the patient's chart for her  consent to telehealth for [facility].    Date:  10/10/2020   ID:  Tara [name], DOB [birth year], MRN [id number] The patient was identified using 2 identifiers.  Patient Location: Home Provider Location: Office/Clinic   PCP:  Dorcas Carrow DO   [facility]  Cardiologist:  Lorine Bears, MD  Advanced Practice Provider:  No care team member to display Electrophysiologist:  None   Evaluation Performed:  Follow-Up Visit  Chief Complaint:  Follow up after cardiac testing  History of Present Illness:    Tara [name] is a 75 y.o. 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   Hx CAD previous stent in 2008 in [place]. AVR with tissue valve 2016 in [place] for AI.  Her son committed suicide approximately 4 years ago and she has not been interested in quitting smoking since that time.    She had cardiac work-up at [facility] in [place] in March 2020.  Echocardiogram 09/2018 with normal LVEF she had a Lexiscan with no evidence of  ischemia.  She had a lower extremity arterial Doppler which showed significant left SFA disease.   She had a temporal artery biopsy done 07/12/19.    Seen in clinic 06/2019. Reported 3 month history of  "charley horses" in her bilateral calves with ambulation. Her chief complaint at that time was a shooting pain in her left upper arm radiating to scapula similar to her anginal equivalent requiring 2 nitroglycerin. Her AM dose of Imdur was increased to . She was recommended for aortogram.   She had angiography with Dr. 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 She was recommended for right femoral to above-knee popliteal bypass which was performed 08/02/19 by Dr. Arbie Cookey.    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  12/14/19 she had normal ABI with 1.01 on the right and 0.99 on the left with triphasic waveforms. She was recommended for compression, elevation, and increased activity for her lyphedema. She noted significant neuropathic pain in the RLE after procedure.   Following with Dr. Malvin Johns of neurology for mild memory loss. With episode of vision changes, imbalance in June. She presented to our office 05/01/20 noting left arm pain which was similar to her anginal equivalent. She was transitioned from Atorvastatin to Rosuvastatin for optimal cholesterol control. Lexiscan myoview 05/15/20 low risk study with LVEF 55-65%. She had echocardiogram and ZIO monitor at the request of neurology. Echo 07/11/20 normal LVEF 60-65%, moderate LVH, no RWMA, gr1DD, RV normal size and function, trivial MR  with moderate mitral annular calcification, mild to moderate aortic valve stenosis with valve prosthesis present VTI 1.49 cm2 and AV mean gradient 15.76mmHg. ZIO monitor 1/21/211 predominantly NSR 76 bpm with 5 beat run of wide complex tachycardia and 2 episodes of SVT longest 7 beats.    Follow-up via phone call today.  Reports no chest pain,  pressure, tightness.  Reports intermittent left arm pain that she tells me is occurring"PRN" for which she is taking nitroglycerin less than once per week and it often resolves with nitroglycerin.  She had recent stress test which was unremarkable and we discussed further increasing her Imdur that she is not interested at this time.No lightheadedness nor dizziness. Checking her BP - average 130/70. No formal exercise routine. Tells me it is hard to exercise as she will sporadically get tremors when she is walking and is following with neurology regarding tremors as well as seizures with adjustment of her medications. Reports she gets "charley horses" in her legs. Predominantly in her left leg up to the back of her backside.  This has occurred since her vascular surgery continue to follow with vascular.  The patient does not have symptoms concerning for COVID-19 infection (fever, chills, cough, or new shortness of breath).    Past Medical History:  Diagnosis Date  . Anxiety   . Arthritis   . Barrett's syndrome   . Carpal tunnel syndrome   . Depression   . GERD (gastroesophageal reflux disease)   . Heart murmur   . Hyperlipidemia   . Hypertension   . Myocardial infarction (HCC)    08  . PAD (peripheral artery disease) (HCC)   . Stroke [facility])    Past Surgical History:  Procedure Laterality Date  . ABDOMINAL AORTOGRAM W/LOWER EXTREMITY N/A 07/28/2019   Procedure: ABDOMINAL AORTOGRAM W/LOWER EXTREMITY;  Surgeon: Iran Ouch, MD;  [facility];  Service: Cardiovascular;  Laterality: N/A;  . ABDOMINAL HYSTERECTOMY    . ARTERY BIOPSY Right 07/12/2019   Procedure: BIOPSY TEMPORAL ARTERY;  Surgeon: Henrene Dodge, MD;  Location: [facility] ORS;  Service: General;  Laterality: Right;  . BREAST BIOPSY Left 2007   Neg- [place]  . CARDIAC CATHETERIZATION    . CARDIAC VALVE REPLACEMENT    . CHOLECYSTECTOMY    . COLON SURGERY    . COLONOSCOPY WITH PROPOFOL N/A 04/09/2019   Procedure:  COLONOSCOPY WITH PROPOFOL;  Surgeon: Pasty Spillers, MD;  Location: [facility] ENDOSCOPY;  Service: Endoscopy;  Laterality: N/A;  . CORONARY ANGIOPLASTY WITH STENT PLACEMENT  2008   [place]; [facility] Stent placement x 1   . DENTAL SURGERY    . ESOPHAGOGASTRODUODENOSCOPY (EGD) WITH PROPOFOL N/A 04/09/2019   Procedure: ESOPHAGOGASTRODUODENOSCOPY (EGD) WITH PROPOFOL;  Surgeon: Pasty Spillers, MD;  Location: [facility] ENDOSCOPY;  Service: Endoscopy;  Laterality: N/A;  . EXCISIONAL HEMORRHOIDECTOMY    . EYE SURGERY    . FEMORAL-POPLITEAL BYPASS GRAFT Right 08/02/2019   Procedure: BYPASS GRAFT FEMORAL-POPLITEAL ARTERY;  Surgeon: Larina Earthly, MD;  Location: [facility] OR;  Service: Vascular;  Laterality: Right;  . HERNIA REPAIR    . LOWER EXTREMITY ANGIOGRAPHY Left 11/11/2019   Procedure: LOWER EXTREMITY ANGIOGRAPHY;  Surgeon: Annice Needy, MD;  Location: [facility] INVASIVE CV LAB;  Service: Cardiovascular;  Laterality: Left;  Marland Kitchen MULTIPLE TOOTH EXTRACTIONS    . TONSILLECTOMY       Current Meds  Medication Sig  . ALPRAZolam (XANAX) 0.5 MG tablet Take 1 tablet (0.5 mg total) by mouth daily  as needed for anxiety.  Marland Kitchen amLODipine (NORVASC) 10 MG tablet Take 1 tablet (10 mg total) by mouth daily.  Marland Kitchen aspirin EC 81 MG tablet Take 1 tablet (81 mg total) by mouth daily.  . B-D TB SYRINGE 1CC/27GX1/2" 27G X 1/2" 1 ML MISC USE AS DIRECTED EVERY 30 DAYS  . citalopram (CELEXA) 40 MG tablet Take 1 tablet (40 mg total) by mouth daily.  . clopidogrel (PLAVIX) 75 MG tablet Take 1 tablet (75 mg total) by mouth daily.  . cyanocobalamin (,VITAMIN B-12,) 1000 MCG/ML injection ADMINISTER 1 ML(1000 MCG) IN THE MUSCLE EVERY 30 DAYS  . diclofenac Sodium (VOLTAREN) 1 % GEL APPLY 4 GRAMS EXTERNALLY TO THE AFFECTED AREA FOUR TIMES DAILY  . famotidine (PEPCID) 20 MG tablet TAKE 1 TABLET(20 MG) BY MOUTH AT BEDTIME  . fenofibrate micronized (LOFIBRA) 200 MG capsule TAKE 1 CAPSULE(200 MG) BY MOUTH DAILY  . Insulin Syringe  27G X 1/2" 1 ML MISC 1 each by Does not apply route every 30 (thirty) days.  . isosorbide mononitrate (IMDUR) 60 MG 24 hr tablet Take 1 tablet (60 mg total) by mouth in the morning and at bedtime.  . lamoTRIgine (LAMICTAL) 25 MG tablet Take by mouth.  . lidocaine (LIDODERM) 5 % 2 patches daily.  Marland Kitchen lisinopril (ZESTRIL) 40 MG tablet TAKE 1 TABLET(40 MG) BY MOUTH TWICE DAILY  . loperamide (IMODIUM) 2 MG capsule Take 2 mg by mouth as needed for diarrhea or loose stools.  . nitroGLYCERIN (NITROSTAT) 0.4 MG SL tablet Place 1 tablet (0.4 mg total) under the tongue every 5 (five) minutes as needed for chest pain.  Marland Kitchen nystatin ointment (MYCOSTATIN) Apply 1 application topically 2 (two) times daily.  Marland Kitchen omeprazole (PRILOSEC) 40 MG capsule TAKE 1 CAPSULE BY MOUTH TWICE DAILY  . ondansetron (ZOFRAN) 4 MG tablet Take 1 tablet (4 mg total) by mouth every 6 (six) hours as needed for nausea or vomiting.  Marland Kitchen oxyCODONE-acetaminophen (PERCOCET/ROXICET) 5-325 MG tablet Take 1 tablet by mouth 4 (four) times daily as needed.  . pregabalin (LYRICA) 75 MG capsule 1 cap qHS for 1 week, then increase to 1 cap BID for 1 week, then 1 cap qAM and 2 caps qPM for 1 week, then 2 caps BID until you see me  . traZODone (DESYREL) 50 MG tablet Take 1 tablet (50 mg total) by mouth at bedtime.  . [DISCONTINUED] rosuvastatin (CRESTOR) 40 MG tablet Take 1 tablet (40 mg total) by mouth daily.   Current Facility-Administered Medications for the 10/10/20 encounter (Telemedicine) with Alver Sorrow, NP  Medication  . cyanocobalamin ((VITAMIN B-12)) injection 1,000 mcg     Allergies:   Patient has no known allergies.   Social History   Tobacco Use  . Smoking status: Light Tobacco Smoker    Packs/day: 0.25    Years: 3.00    Pack years: 0.75    Types: Cigarettes  . Smokeless tobacco: Never Used  . Tobacco comment: " 3 cigarettes per day "-1 pack per week  Vaping Use  . Vaping Use: Never used  Substance Use Topics  . Alcohol  use: Never  . Drug use: Never     Family Hx: The patient's family history includes Breast cancer (age of onset: 10) in her mother. She was adopted.  ROS:   Please see the history of present illness.     All other systems reviewed and are negative.   Prior CV studies:   The following studies were reviewed today  Lexiscan myoview  04/2020 T wave inversion was noted during stress in the I and aVL leads. There was no ST segment deviation noted during stress. The study is normal. This is a low risk study. The left ventricular ejection fraction is normal (55-65%).  Monitor 08/11/2020 Normal sinus rhythm with an average heart rate of 76 bpm. 5 beat run of  wide-complex tachycardia at a rate of 167 bpm. 2 episodes of SVT noted the longest lasted 7 beats. Rare PACs and rare PVCs. No evidence of atrial fibrillation.  Echo 07/11/2020 1. Left ventricular ejection fraction, by estimation, is 60 to 65%. The  left ventricle has normal function. The left ventricle has no regional  wall motion abnormalities. There is moderate left ventricular hypertrophy.  Left ventricular diastolic  parameters are consistent with Grade I diastolic dysfunction (impaired  relaxation).   2. Right ventricular systolic function is normal. The right ventricular  size is normal. Tricuspid regurgitation signal is inadequate for assessing  PA pressure.   3. Left atrial size was mildly dilated.   4. The mitral valve is abnormal. Trivial mitral valve regurgitation. No  evidence of mitral stenosis. Moderate mitral annular calcification.   5. The aortic valve is normal in structure. Aortic valve regurgitation is  not visualized. Mild to moderate aortic valve stenosis. There is a bovine  valve present in the aortic position. Procedure Date: 2016. Aortic valve  area, by VTI measures 1.49 cm.  Aortic valve mean gradient measures 15.8 mmHg.    Labs/Other Tests and Data Reviewed:    EKG:  No ECG reviewed.  Recent  Labs: 09/12/2020: ALT 11; BUN 27; Creatinine, Ser 0.95; Hemoglobin 12.7; Platelets 176; Potassium 4.1; Sodium 143; TSH 2.400   Recent Lipid Panel Lab Results  Component Value Date/Time   CHOL 113 09/12/2020 02:15 PM   TRIG 103 09/12/2020 02:15 PM   HDL 44 09/12/2020 02:15 PM   CHOLHDL 5.5 07/13/2019 04:15 PM   LDLCALC 50 09/12/2020 02:15 PM   LDLDIRECT 145.7 (H) 07/13/2019 04:15 PM    Wt Readings from Last 3 Encounters:  10/10/20 172 lb (78 kg)  09/12/20 195 lb (88.5 kg)  08/04/20 185 lb (83.9 kg)     Objective:    Vital Signs:  BP 130/70 (BP Location: Left Arm, Patient Position: Sitting, Cuff Size: Normal)   Ht 5\' 4"  (1.626 m)   Wt 172 lb (78 kg)   BMI 29.52 kg/m    VITAL SIGNS:  reviewed  ASSESSMENT & PLAN:    1. CAD -reports occasional left arm pain which is her anginal equivalent.  Lexiscan Myoview October 2021 with no evidence of ischemia.  Tells me the left arm pain has been similar since that time.  She is taking nitroglycerin 1 tablet less than once per week with relief.  We discussed further increasing her dose of Imdur but she prefers to defer at this time.  She will report worsening anginal equivalent for which would recommend invasive evaluation versus trial of increased dose of Imdur.  GDMT includes aspirin, Plavix, rosuvastatin.   2. PAD - Continue to follow with vascular surgery.  3. S/p bioprosthetic AVR - Continue to monitor with periodic echo.  4. HTN - BP well controlled. Continue current antihypertensive regimen.   5. Tobacco use -not interested in quitting. Smoking cessation encouraged. Recommend utilization of 1800QUITNOW.  6. HLD, LDL goal <70 -recent LDL goal less than 70.  Continue rosuvastatin 40 mg daily.  7. History of CVA - Continue Plavix, Aspirin, and rosuvastatin.  Continue  to follow with neurology.  Noted expressive aphasia.  COVID-19 Education: The signs and symptoms of COVID-19 were discussed with the patient and how to seek care for  testing (follow up with PCP or arrange E-visit).  The importance of social distancing was discussed today.  Time:   Today, I have spent 12 minutes with the patient with telehealth technology discussing the above problems.     Medication Adjustments/Labs and Tests Ordered: Current medicines are reviewed at length with the patient today.  Concerns regarding medicines are outlined above.   Tests Ordered: No orders of the defined types were placed in this encounter.   Medication Changes: Meds ordered this encounter  Medications  . rosuvastatin (CRESTOR) 40 MG tablet    Sig: Take 1 tablet (40 mg total) by mouth daily.    Dispense:  90 tablet    Refill:  3    Order Specific Question:   Supervising Provider    Answer:   Baldo Daub [[id number]]    Follow Up:  In Person in 4 month(s) with Dr. Kirke Corin or APP  Signed, Alver Sorrow, NP  10/10/2020 4:20 PM    [facility]

## 2020-10-30 ENCOUNTER — Other Ambulatory Visit: Payer: Self-pay | Admitting: Family Medicine

## 2020-11-14 ENCOUNTER — Telehealth: Payer: Federal, State, Local not specified - PPO | Admitting: General Practice

## 2020-11-14 ENCOUNTER — Ambulatory Visit (INDEPENDENT_AMBULATORY_CARE_PROVIDER_SITE_OTHER): Payer: Federal, State, Local not specified - PPO | Admitting: General Practice

## 2020-11-14 DIAGNOSIS — E782 Mixed hyperlipidemia: Secondary | ICD-10-CM | POA: Diagnosis not present

## 2020-11-14 DIAGNOSIS — F339 Major depressive disorder, recurrent, unspecified: Secondary | ICD-10-CM | POA: Diagnosis not present

## 2020-11-14 DIAGNOSIS — I10 Essential (primary) hypertension: Secondary | ICD-10-CM | POA: Diagnosis not present

## 2020-11-14 DIAGNOSIS — R413 Other amnesia: Secondary | ICD-10-CM

## 2020-11-14 DIAGNOSIS — I69312 Visuospatial deficit and spatial neglect following cerebral infarction: Secondary | ICD-10-CM

## 2020-11-14 DIAGNOSIS — Z8673 Personal history of transient ischemic attack (TIA), and cerebral infarction without residual deficits: Secondary | ICD-10-CM

## 2020-11-14 NOTE — Patient Instructions (Signed)
Visit Information  PATIENT GOALS: Patient Care Plan: General Social Work (Adult)    Problem Identified: Coping Skills (General Plan of Care)     Long-Range Goal: Coping Skills Enhanced   Start Date: 07/31/2020  Priority: Medium  Note:     Timeframe:  Long-Range Goal Priority:  Medium Start Date: 10/02/20                          Expected End Date: 01/02/21                   Follow Up Date -90 days from 10/02/20   - check out options for in-home help, long-term care or hospice - complete a living will - discuss my treatment options with the doctor or nurse - do one enjoyable thing every day - do something different, like talking to a new person or going to a new place, every day - learn something new by asking, reading and searching the Internet every day - make an audio or video recording for my loved ones - make shared treatment decisions with doctor - meditate daily - name a health care proxy (decision maker) - share memories using a picture album or scrapbook with my loved ones - spend time with a child every day, borrow one if I have to - spend time outdoors at least 3 times a week - strengthen or fix relationships with loved ones    Why is this important?    Having a long-term illness can be scary.   It can also be stressful for you and your caregiver.   These steps may help.    Assessment, press & current barriers:  Patient unable to consistently perform activities of daily living and needs additional assistance and support in order to meet this unmet need . Limited social support, Level of care concerns, ADL IADL limitations, Social Isolation, Limited access to caregiver, Cognitive Deficits, Memory Deficits, and Blind . Lacks social connections  Clinical Goals:   Over the next 120 days, patient will have additional in home support education provided to both patient and daughter (caregiver) in order to increase support and safety within the home Over the next 120  days, patient/caregiver will work with SW to address concerns related to care coordination needs, lack of a stable support network and lack of Brewing technologist. LCSW will assist patient in gaining additional support in order to maintain health and mental health appropriately  Over the next 120 days, patient will demonstrate improved adherence to self care as evidenced by implementing healthy self-care into her daily routine such as: attending all medical appointments, deep breathing exercises, taking time for self-reflection, taking medications as prescribed, drinking water and daily exercise to improve mobility and mood.  Over the next 120 days, patient will demonstrate improved health management independence as evidenced by implementing healthy self-care skills and positive support/resource implementation into their daily routine to help cope with stressors and improve overall health and well-being  Over the next 120 days, patient or caregiver will verbalize basic understanding of depression/stress process and self health management plan as evidenced by her participation in development of long term plan of care and institution of self health management strategies  Interventions : . Assessed needs, level of care concerns, basic eligibility and provided education on in home support resources . Reviewed community support options (PCS, CHORE, [facility], CAP, private pay, PACE program) . Collaborate with primary care provider per patient request.  Patient's daughter reports that patient is doing well and stable for now. . Patient is now going to a pain clinic and on pain medication which contributed to her having to discontinue xanax. Daughter reports NO withdrawal symptoms with this transition as patient only took her xanax as needed.   . Provided family with community resources for the blind. DSS Social Worker of the Blind- Phone: [phone]. The [place] Division of Services for  the Blind (DSB) provides services to people who are blind or visually impaired to help them reach their goals of independence and employment-[phone]. Cleora Fleet, Adaptive and Inclusive Recreation Coordinator-[phone]. These resources and other PCS resources were sent to daughter's email on 07/31/20.  . Patient reports that her memory loss has increased since her most recent stroke. She reports ongoing issues with finding her words in order to communicate. . Patient reports that her xanax medication continues to help her manage her anxiety and panic.  Marland Kitchen Patient's spouse is also in need of a caregiver as his last aide through the [place] ended her services. Family is currently working with [place] social worker to get these services started again.  . Patient's family was unaware of their upcoming appointment with NP Laurann Montana on 10/10/20. Family was unaware of this referral. This is a telephonic visit but daughter is agreeable to contact this provider to question reason for visit. Contact information provided to family.  . Patient reports that her main socialization comes from talking over the phone to her sister, friend and cousin.  . Patient interviewed and appropriate assessments performed . Discussed plans with patient for ongoing care management follow up and provided patient with direct contact information for care management team . Assisted patient/caregiver with obtaining information about health plan benefits . Provided education and assistance to client regarding Advanced Directives. . Provided education to patient/caregiver regarding level of care options. . Provided education to patient/caregiver about Hospice and/or Palliative Care services . Other interventions provided: Solution-Focused Strategies Patient Goals/Self-Care Activities: Over the next 120 days . Review resources provided  . Call agencies listed in email that was sent out on 07/31/20.   Task: Support Psychosocial  Response to Risk or Actual Health Condition   Note:   Care Management Activities:    - active listening utilized - counseling provided - current coping strategies identified - decision-making supported - healthy lifestyle promoted - journaling promoted - meditative movement therapy encouraged - mindfulness encouraged - participation in counseling encouraged - problem-solving facilitated - relaxation techniques promoted - self-reflection promoted - spiritual activities promoted - verbalization of feelings encouraged    Notes:    Patient Care Plan: RNCM: HLD management    Problem Identified: RNCM: HLD management   Priority: Medium    Long-Range Goal: RNCM: HLD Coping Skills Enhanced   Priority: Medium  Note:   Current Barriers:  . Poorly controlled hyperlipidemia, complicated by smoker, memory changes, several chronic conditions  . Current antihyperlipidemic regimen: Is unsure about the medications, referral in for pharmacy support and pcp recommendations  . Most recent lipid panel:  . Lab Results .  Component . Value . Date .   Marland Kitchen CHOL . 113 . 09/12/2020 .   Marland Kitchen HDL . 44 . 09/12/2020 .   Marland Kitchen [place] . 50 . 09/12/2020 .   Marland Kitchen LDLDIRECT . 145.7 (H) . 07/13/2019 .   Marland Kitchen TRIG . 103 . 09/12/2020 .   Marland Kitchen CHOLHDL . 5.5 . 07/13/2019 .    Marland Kitchen ASCVD risk enhancing conditions: age >91,  HTN, current smoker . Unable to independently manage HLD . Unable to self administer medications as prescribed . Does not attend all scheduled provider appointments . Does not adhere to prescribed medication regimen . Lacks social connections . Unable to perform ADLs independently . Unable to perform IADLs independently . Does not maintain contact with provider office . Does not contact provider office for questions/concerns  RN Care Manager Clinical Goal(s):  Marland Kitchen Over the next 120 days, patient will work with Consulting civil engineer, providers, and care team towards execution of optimized self-health management  plan . Over the next 120 days, patient will verbalize understanding of plan for effective management of HLD . Over the next 120 days, patient will work with [facility] [place] team and pcp to address needs related to HLD management   Interventions: . Collaboration with Valerie Roys, DO regarding development and update of comprehensive plan of care as evidenced by provider attestation and co-signature . Inter-disciplinary care team collaboration (see longitudinal plan of care) . Medication review performed; medication list updated in electronic medical record. 11-14-2020: Reviewed and changes noted from recent neurology appointment  . Inter-disciplinary care team collaboration (see longitudinal plan of care) . Referred to pharmacy team for assistance with HLD medication management . Evaluation of current treatment plan related to HLD and patient's adherence to plan as established by provider. 11-14-2020: The patients daughter states that the patient is eating well and she is monitoring her intake.  . Advised patient to call the office for changes in conditions or questions. 11-14-2020: Review and reinforced  . Provided education to patient re: working with CCM team, referrals in place, and collaboration with pcp . Discussed plans with patient for ongoing care management follow up and provided patient with direct contact information for care management team . Next appointment with pcp on 12-11-2020 at 3 pm   Patient Goals/Self-Care Activities: . Over the next 120 days, patient will:   - call for medicine refill 2 or 3 days before it runs out - call if I am sick and can't take my medicine - keep a list of all the medicines I take; vitamins and herbals too - learn to read medicine labels - use a pillbox to sort medicine- 11-14-2020: The patients daughter [name] is managing the patients medication - use an alarm clock or phone to remind me to take my medicine - change to whole grain breads, cereal, pasta -  drink 6 to 8 glasses of water each day - eat 3 to 5 servings of fruits and vegetables each day - eat 5 or 6 small meals each day - fill half the plate with nonstarchy vegetables - limit fast food meals to no more than 1 per week - manage portion size - prepare main meal at home 3 to 5 days each week - read food labels for fat, fiber, carbohydrates and portion size - be open to making changes - I can manage, know and watch for signs of a heart attack - if I have chest pain, call for help - learn about small changes that will make a big difference - learn my personal risk factors  - active listening utilized - caregiver stress acknowledged - counseling provided - current coping strategies identified - decision-making supported - healthy lifestyle promoted - meditative movement therapy encouraged - mindfulness encouraged - participation in counseling encouraged - problem-solving facilitated - self-reflection promoted - spiritual activities promoted - verbalization of feelings encouraged  Follow Up Plan: Telephone follow up appointment with  care management team member scheduled for: 01-17-2021 1030 am     Task: RNCM: HLD-Support Psychosocial Response to Risk or Actual Health Condition   Note:   Care Management Activities:    - active listening utilized - caregiver stress acknowledged - counseling provided - current coping strategies identified - decision-making supported - healthy lifestyle promoted - meditative movement therapy encouraged - mindfulness encouraged - participation in counseling encouraged - problem-solving facilitated - self-reflection promoted - spiritual activities promoted - verbalization of feelings encouraged       Problem Identified: RNCM: Vision Changes Quality of Life (General Plan of Care)   Priority: High    Long-Range Goal: RNCM: Vision Changes Quality of Life Maintained   Priority: High  Note:   Current Barriers:  Marland Kitchen Knowledge Deficits  related to vision changes due to CVA . Care Coordination needs related to resources in the community  in a patient with changes in vision and loss of vision  . Lacks caregiver support.  . Film/video editor.  . Transportation barriers . Cognitive Deficits . Unable to independently manage care due to changes in vision . Unable to self administer medications as prescribed . Does not attend all scheduled provider appointments . Does not adhere to prescribed medication regimen . Lacks social connections . Unable to perform ADLs independently . Unable to perform IADLs independently . Does not maintain contact with provider office . Does not contact provider office for questions/concerns  Nurse Case Manager Clinical Goal(s):  Marland Kitchen Over the next 120 days, patient will verbalize understanding of plan for effective management of meeting needs in patient with vision changes  . Over the next 120 days, patient will work with [facility] [place] team, and specialist  to address needs related to vision loss  . Over the next 120 days, patient will attend all scheduled medical appointments: eye specialist on 08-10-2020, appointment completed. The patient declared legally blind Interventions:  . 1:1 collaboration with Valerie Roys, DO regarding development and update of comprehensive plan of care as evidenced by provider attestation and co-signature . Inter-disciplinary care team collaboration (see longitudinal plan of care) . Evaluation of current treatment plan related to loss of vision  and patient's adherence to plan as established by provider. 08-16-2020: Saw eye specialist last week. Was diagnosed with cortical blindness. The patient if not handling well, but has support from her daughter. 09-19-2020: The daughter states things are about the same. They are adjusting to the new diagnosis and are taking things a day at a time. 11-14-2020: The patients daughter states the patient is not seeing well at all. She gets  frustrated and sometimes thinks she has different shoes on and the daughter has to redirect her. She is dealing with it the best she can and it is on a day to day basis.  . Advised patient to keep appointment with eye doctor, call office for changes in condition  . Provided education to patient re: services for the blind and working with resources to help with patients expressed needs due to vision loss. 08-16-2020: Review of resources available for services for the blind. This is all new to the daughter and the patient. Will continue to monitor for changes and needs. 09-19-2020: The daughter has resources. Also sending text message so the daughter can sign up for my chart to view notes and records. Text message sent to [phone]. Education provided for the daughter.  11-14-2020: The patients daughter is set up for my chart now and can view notes.  Marland Kitchen  Discussed plans with patient for ongoing care management follow up and provided patient with direct contact information for care management team . Reviewed scheduled/upcoming provider appointments including: eye specialist on 08-15-2020 at 0900 am. 11-14-2020: Will see pcp on 12-11-2020 at 3 pm, has upcoming sleep study in May.  Patient Goals/Self-Care Activities Over the next 120 days, patient will:  - Patient will self administer medications as prescribed Patient will attend all scheduled provider appointments Patient will call pharmacy for medication refills Patient will continue to perform ADL's independently Patient will continue to perform IADL's independently Patient will call provider office for new concerns or questions Patient will work with BSW to address care coordination needs and will continue to work with the clinical team to address health care and disease management related needs.    - affirmation provided - community involvement promoted - expression of thoughts about present/future encouraged - independence in all possible areas  promoted - patient strengths promoted - self-expression encouraged - sleep hygiene techniques encouraged - social relationships promoted Follow Up Plan: Telephone follow up appointment with care management team member scheduled for: 6-29--2022 at 1030 am       Task: [facility] Psychiatric: Support and Maintain Acceptable Degree of Health, Comfort and Happiness   Note:   Care Management Activities:    - affirmation provided - community involvement promoted - expression of thoughts about present/future encouraged - independence in all possible areas promoted - patient strengths promoted - self-expression encouraged - sleep hygiene techniques encouraged - social relationships promoted       Patient Care Plan: RNCM: Stroke (Adult)    Problem Identified: RNCM: Emotional Adjustment to Disease (Stroke)   Priority: High    Long-Range Goal: RNCM: Effective management of stroke   Priority: High  Note:   Current Barriers:  Marland Kitchen Knowledge Deficits related to effective management of s/p stroke and TIas . 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 Chronic Disease Management support and education needs related to management of stroke and prevention of new HA and/orstroke  . Lacks caregiver support.  . Film/video editor.  . Transportation barriers . Cognitive Deficits . Unable to independently manage care due to "mini-strokes" and TIAs . Unable to self administer medications as prescribed . Lacks social connections . Unable to perform ADLs independently . Unable to perform IADLs independently . Does not contact provider office for questions/concerns  Nurse Case Manager Clinical Goal(s):  Marland Kitchen Over the next 120 days, patient will verbalize understanding of plan for effective management of CVA and residual effects from TIAs  . Over the next 120 days, patient will work with [facility],  CCM team and pcp to address needs related to **care of patient in the home with post residual deficits and decline in condition . Over the next 120 days, patient will demonstrate a decrease in TIA episodes  exacerbations as evidenced by no new CVA events  . Over the next 120 days, patient will work with CM team pharmacist to reconcile medications, polypharmacy and dubplications  . Over the next 120 days, patient will work with care guides  (community agency) to help with expressed needs: transportation, food resources, and community resources   Interventions:  . 1:1 collaboration with Valerie Roys, DO regarding development and update of comprehensive plan of care as evidenced by provider attestation and co-signature . Inter-disciplinary care team collaboration (see longitudinal plan of care) .  Provided education to patient re: talking to care guides about resources, calling insurance to inquire about life alert, working with CCM team to meet expressed needs  . Reviewed medications with patient and discussed compliance and pharmacy referral. 08-16-2020: The pharmacist has reviewed with the daughter medications list and the daughter is more at ease with medications. The pharmacist will continue to work with the patients daughter. 11-14-2020: The daughter [name] denies any medication concerns today. Review of medication and daughter updated the RNCM on changes recently from the neurology. Nash Dimmer with pcp and pharmacy  regarding medication question and concerns. 11-14-2020: The patients daughter is managing medications and has not new questions at this time. Will continue to follow. . Discussed plans with patient for ongoing care management follow up and provided patient with direct contact information for care management team . Care Guide referral for food, transportation, and community resources  . Social Work referral for needs in the home, placement questions- currently working with  LCSW . Pharmacy referral for polypharmacy, medication reconciliation, duplicate medications   Patient Goals/Self-Care Activities Over the next 120 days, patient will:  - Patient will self administer medications as prescribed Patient will attend all scheduled provider appointments Patient will call pharmacy for medication refills Patient will continue to perform ADL's independently Patient will continue to perform IADL's independently Patient will call provider office for new concerns or questions Patient will work with BSW to address care coordination needs and will continue to work with the clinical team to address health care and disease management related needs.   - caregiver stress acknowledged - caregiver support provided - counseling provided - decision-making supported - depression screen reviewed - family care conference arranged - financial counseling provided - goal-setting facilitated - positive reinforcement provided - problem-solving facilitated - self-care encouraged - self-reflection promoted - verbalization of feelings encouraged   Follow Up Plan: Telephone follow up appointment with care management team member scheduled for: 01-17-2021 at 1030 am       Task: RNCM: Support Psychosocial Response to Stroke   Note:   Care Management Activities:    - caregiver stress acknowledged - caregiver support provided - counseling provided - decision-making supported - depression screen reviewed - family care conference arranged - financial counseling provided - goal-setting facilitated - positive reinforcement provided - problem-solving facilitated - self-care encouraged - self-reflection promoted - verbalization of feelings encouraged       Patient Care Plan: RNCM: Hypertension (Adult)    Problem Identified: RNCM: Hypertension (Hypertension)   Priority: Medium    Long-Range Goal: RNCM: Hypertension Monitored   Priority: Medium  Note:   Objective:   . Last practice recorded BP readings:  . BP Readings from Last 3 Encounters: .  10/10/20 . 130/70 .  09/12/20 . 135/77 .  08/04/20 . 135/65 .     Marland Kitchen Most recent eGFR/CrCl: No results found for: EGFR  No components found for: CRCL Current Barriers:  Marland Kitchen Knowledge Deficits related to basic understanding of hypertension pathophysiology and self care management . Knowledge Deficits related to understanding of medications prescribed for management of hypertension . Non-adherence to prescribed medication regimen . Non-adherence to scheduled provider appointments . Transportation barriers . Cognitive Deficits . Limited Social Support . Film/video editor.  . Unable to independently manage HTN . Unable to self administer medications as prescribed . Does not attend all scheduled provider appointments . Does not adhere to prescribed medication regimen . Lacks social connections . Unable to perform ADLs independently . Unable to perform  IADLs independently . Does not maintain contact with provider office . Does not contact provider office for questions/concerns Case Manager Clinical Goal(s):  Marland Kitchen Over the next 120 days, patient will verbalize understanding of plan for hypertension management . Over the next 120 days, patient will attend all scheduled medical appointments: will see pcp in the next few weeks. Knows to call for an appointment  . Over the next 120 days, patient will demonstrate improved adherence to prescribed treatment plan for hypertension as evidenced by taking all medications as prescribed, monitoring and recording blood pressure as directed, adhering to low sodium/DASH diet . Over the next 120 days, patient will demonstrate improved health management independence as evidenced by checking blood pressure as directed and notifying PCP if SBP>160 or DBP > 90, taking all medications as prescribe, and adhering to a low sodium diet as discussed. Interventions:  . Collaboration with  Valerie Roys, DO regarding development and update of comprehensive plan of care as evidenced by provider attestation and co-signature . Inter-disciplinary care team collaboration (see longitudinal plan of care) . UNABLE to independently:manage HTN . Evaluation of current treatment plan related to hypertension self management and patient's adherence to plan as established by provider. 08-16-2020: Home health is coming into the home now working with the patient. Nurse has been there, pt working with the patient. OT ordered by home health provider. 11-14-2020: The patients daughter states that it is a day to day situation. She says things are about the same.  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 The patients daughter says she is doing what she needs to do to care for the patient. Feels the medication changes has been helpful and the patient is sleeping better. She is still having tremors. Continues to work with neurologist at this time. Will see the pcp on 12-11-2020: Recommended writing down questions to ask the pcp at her visit. Marland Kitchen Provided education to patient re: stroke prevention, s/s of heart attack and stroke, DASH diet, complications of uncontrolled blood pressure . Reviewed medications with patient and discussed importance of compliance. 08-16-2020: The daughter is more at ease about the patients medications at this time. 11-14-2020: The patients daughter is managing medications for the patient. She is compliant with the current regimen.  . Discussed plans with patient for ongoing care management follow up and provided patient with direct contact information for care management team . Advised patient, providing education and rationale, to monitor blood pressure daily and record, calling PCP for findings outside established parameters.  Patient Goals/Self-Care Activities . Over the next 120 days, patient will:  - UNABLE to independently manage HTN Self administers medications as prescribed Attends all  scheduled provider appointments Calls provider office for new concerns, questions, or BP outside discussed parameters Checks BP and records as discussed Follows a low sodium diet/DASH diet- compliant with dietary restrictions  - blood pressure trends reviewed - depression screen reviewed - home or ambulatory blood pressure monitoring encouraged Follow Up Plan: Telephone follow up appointment with care management team member scheduled for: 01-17-2021 at 1030 am   Task: RNCM: Identify and Monitor Blood Pressure Elevation   Note:   Care Management Activities:    - blood pressure trends reviewed - depression screen reviewed - home or ambulatory blood pressure monitoring encouraged    Notes: Does not have a blood pressure cuff. Checking into OTC benefit with insurance company to obtain a blood pressure cuff.    Patient Care Plan: RNCM: Depression (Adult)    Problem Identified: RNCM:  Depression Identification (Depression)   Priority: Medium    Long-Range Goal: RNCM: Depressive Symptoms Identified   Priority: Medium  Note:   Current Barriers:  Marland Kitchen Knowledge Deficits related to depression and anxiety  . Chronic Disease Management support and education needs related to depression and anxiety  . Lacks caregiver support.  . Film/video editor.  . Transportation barriers . Non-adherence to scheduled provider appointments . Non-adherence to prescribed medication regimen . Unable to independently manage depression and anxiety  . Unable to self administer medications as prescribed . Does not attend all scheduled provider appointments . Does not adhere to prescribed medication regimen . Lacks social connections . Unable to perform ADLs independently . Unable to perform IADLs independently . Does not maintain contact with provider office . Does not contact provider office for questions/concerns  Nurse Case Manager Clinical Goal(s):  Marland Kitchen Over the next 120 days, patient will verbalize  understanding of plan for management of depression and anxiety . Over the next 120 days, patient will work with [place], [place], and pcp to address needs related to depression and anxiety . Over the next 120 days, patient will attend all scheduled medical appointments: will call for appointment with pcp  Interventions:  . 1:1 collaboration with Valerie Roys, DO regarding development and update of comprehensive plan of care as evidenced by provider attestation and co-signature . Inter-disciplinary care team collaboration (see longitudinal plan of care) . Evaluation of current treatment plan related to anxiety and depression  and patient's adherence to plan as established by provider. 08-16-2020: Saw neurologist and eye specialist. 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 Will need CCM team support. 11-14-2020: [name] states that they are taking it day by day. She has good days and not so good days. The daughter is with the patient 24/7, she can tell when she is having an off day. 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 The daughter knows to call for changes. States some days are worse than other with her depression. She is having a good day today. Working with LCSW also. Knows resources are available.  . Provided education to patient and/or caregiver about advanced directives . Provided education to patient re: calling the office for changes in mood/anxiety/depression . Reviewed medications with patient and discussed compliance, poly pharmacy, and duplications- pharmacy referral  . Collaborated with RNCM, pcp, and CCM team regarding patients expressed needs  . Discussed plans with patient for ongoing care management follow up and provided patient with direct contact information for care management team . Reviewed scheduled/upcoming provider appointments including: will see the pcp in the next few weeks, knows to call for changes.  Next appointment on 12-11-2020 at 3 pm  Patient  Goals/Self-Care Activities Over the next 120 days, patient will:  - Patient will self administer medications as prescribed Patient will attend all scheduled provider appointments Patient will call pharmacy for medication refills Patient is dependent on daughter and others to help meet her ADLS/IADLS Patient will call provider office for new concerns or questions Patient will work with BSW to address care coordination needs and will continue to work with the clinical team to address health care and disease management related needs.    - anxiety screen reviewed - depression screen reviewed - medication list reviewed - participation in psychiatric services encouraged - substance use assessed - substance use risk screen reviewed Follow Up Plan: Telephone follow up appointment with care management team member scheduled for: 01-17-2021 at 1030  am  Task: RNCM: Identify Depressive Symptoms and Facilitate Treatment   Note:   Care Management Activities:    - anxiety screen reviewed - depression screen reviewed - medication list reviewed - participation in psychiatric services encouraged - substance use assessed - substance use risk screen reviewed         Patient verbalizes understanding of instructions provided today and agrees to view in [place].   Telephone follow up appointment with care management team member scheduled for: 01-17-2021 at 63 am  Noreene Larsson RN, MSN, [facility] Mobile: [phone]

## 2020-11-14 NOTE — Chronic Care Management (AMB) (Signed)
Chronic Care Management   CCM RN Visit Note  11/14/2020 Name: Tara [name] MRN: [id number] DOB: [birth year]  Subjective: Tara [name] is a 75 y.o. year old female who is a primary care patient of Valerie Roys, DO. The care management team was consulted for assistance with disease management and care coordination needs.    Engaged with patient by telephone for follow up visit in response to provider referral for case management and/or care coordination services. Spoke to the patients daughter Tara [name].   Consent to Services:  The patient was given information about Chronic Care Management services, agreed to services, and gave verbal consent prior to initiation of services.  Please see initial visit note for detailed documentation.   Patient agreed to services and verbal consent obtained.   Assessment: Review of patient past medical history, allergies, medications, health status, including review of consultants reports, laboratory and other test data, was performed as part of comprehensive evaluation and provision of chronic care management services.   SDOH (Social Determinants of Health) assessments and interventions performed:    CCM Care Plan  No Known Allergies  Outpatient Encounter Medications as of 11/14/2020  Medication Sig  . ALPRAZolam (XANAX) 0.5 MG tablet Take 1 tablet (0.5 mg total) by mouth daily as needed for anxiety.  Marland Kitchen amLODipine (NORVASC) 10 MG tablet Take 1 tablet (10 mg total) by mouth daily.  Marland Kitchen aspirin EC 81 MG tablet Take 1 tablet (81 mg total) by mouth daily.  . B-D TB SYRINGE 1CC/27GX1/2" 27G X 1/2" 1 ML MISC USE AS DIRECTED EVERY 30 DAYS  . citalopram (CELEXA) 40 MG tablet Take 1 tablet (40 mg total) by mouth daily.  . clopidogrel (PLAVIX) 75 MG tablet Take 1 tablet (75 mg total) by mouth daily.  . cyanocobalamin (,VITAMIN B-12,) 1000 MCG/ML injection ADMINISTER 1 ML(1000 MCG) IN THE MUSCLE EVERY 30 DAYS  . diclofenac Sodium (VOLTAREN) 1 % GEL  APPLY 4 GRAMS EXTERNALLY TO THE AFFECTED AREA FOUR TIMES DAILY  . famotidine (PEPCID) 20 MG tablet TAKE 1 TABLET(20 MG) BY MOUTH AT BEDTIME  . fenofibrate micronized (LOFIBRA) 200 MG capsule TAKE 1 CAPSULE(200 MG) BY MOUTH DAILY  . Insulin Syringe 27G X 1/2" 1 ML MISC 1 each by Does not apply route every 30 (thirty) days.  . isosorbide mononitrate (IMDUR) 60 MG 24 hr tablet Take 1 tablet (60 mg total) by mouth in the morning and at bedtime.  . lamoTRIgine (LAMICTAL) 25 MG tablet Take by mouth.  . lidocaine (LIDODERM) 5 % 2 patches daily.  Marland Kitchen lisinopril (ZESTRIL) 40 MG tablet TAKE 1 TABLET(40 MG) BY MOUTH TWICE DAILY  . loperamide (IMODIUM) 2 MG capsule Take 2 mg by mouth as needed for diarrhea or loose stools.  . nitroGLYCERIN (NITROSTAT) 0.4 MG SL tablet Place 1 tablet (0.4 mg total) under the tongue every 5 (five) minutes as needed for chest pain.  Marland Kitchen nystatin ointment (MYCOSTATIN) Apply 1 application topically 2 (two) times daily.  Marland Kitchen omeprazole (PRILOSEC) 40 MG capsule TAKE 1 CAPSULE BY MOUTH TWICE DAILY  . ondansetron (ZOFRAN) 4 MG tablet Take 1 tablet (4 mg total) by mouth every 6 (six) hours as needed for nausea or vomiting.  Marland Kitchen oxyCODONE-acetaminophen (PERCOCET/ROXICET) 5-325 MG tablet Take 1 tablet by mouth 4 (four) times daily as needed.  . pregabalin (LYRICA) 75 MG capsule 1 cap qHS for 1 week, then increase to 1 cap BID for 1 week, then 1 cap qAM and 2 caps qPM for 1 week, then  2 caps BID until you see me  . rosuvastatin (CRESTOR) 40 MG tablet Take 1 tablet (40 mg total) by mouth daily.  . traZODone (DESYREL) 50 MG tablet Take 1 tablet (50 mg total) by mouth at bedtime.   No facility-administered encounter medications on file as of 11/14/2020.    Patient Active Problem List   Diagnosis Date Noted  . 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 S/P stroke due to cerebrovascular disease 08/06/2020  . Polyneuropathy, unspecified 12/08/2019  .  Difficulty sleeping 10/29/2019  . Mild cognitive impairment 10/29/2019  . Right leg numbness 10/29/2019  . Lymphedema 10/26/2019  . Atherosclerosis of native arteries of extremity with intermittent claudication ([place]) 10/26/2019  . Nerve pain 10/12/2019  . Memory loss 10/12/2019  . PAD (peripheral artery disease) ([place]) 08/02/2019  . Temporal arteritis ([place])   . Other intervertebral disc degeneration, thoracic region 06/21/2019  . GERD (gastroesophageal reflux disease) 06/21/2019  . Angiodysplasia of intestinal tract   . Intestinal bypass or anastomosis status   . History of aortic valve replacement 02/07/2019  . B12 deficiency 02/07/2019  . Anxiety 02/05/2019  . HTN (hypertension) 02/05/2019  . Hyperlipidemia 02/05/2019  . IBS (irritable bowel syndrome) 02/05/2019  . Arthritis of both knees 02/05/2019  . CTS (carpal tunnel syndrome) 02/05/2019    Conditions to be addressed/monitored:HTN, HLD, Depression and History of stroke with mini-strokes and blindness   Care Plan : RNCM: HLD management  Updates made by Vanita Ingles since 11/14/2020 12:00 AM    Problem: RNCM: HLD management   Priority: Medium    Long-Range Goal: RNCM: HLD Coping Skills Enhanced   Priority: Medium  Note:   Current Barriers:  . Poorly controlled hyperlipidemia, complicated by smoker, memory changes, several chronic conditions  . Current antihyperlipidemic regimen: Is unsure about the medications, referral in for pharmacy support and pcp recommendations  . Most recent lipid panel:  . Lab Results .  Component . Value . Date .   Marland Kitchen CHOL . 113 . 09/12/2020 .   Marland Kitchen HDL . 44 . 09/12/2020 .   Marland Kitchen [place] . 50 . 09/12/2020 .   Marland Kitchen LDLDIRECT . 145.7 (H) . 07/13/2019 .   Marland Kitchen TRIG . 103 . 09/12/2020 .   Marland Kitchen CHOLHDL . 5.5 . 07/13/2019 .    Marland Kitchen ASCVD risk enhancing conditions: age >65, HTN, current smoker . Unable to independently manage HLD . Unable to self administer medications as prescribed . Does not attend all  scheduled provider appointments . Does not adhere to prescribed medication regimen . Lacks social connections . Unable to perform ADLs independently . Unable to perform IADLs independently . Does not maintain contact with provider office . Does not contact provider office for questions/concerns  RN Care Manager Clinical Goal(s):  Marland Kitchen Over the next 120 days, patient will work with Consulting civil engineer, providers, and care team towards execution of optimized self-health management plan . Over the next 120 days, patient will verbalize understanding of plan for effective management of HLD . Over the next 120 days, patient will work with [facility] [place] team and pcp to address needs related to HLD management   Interventions: . Collaboration with Valerie Roys, DO regarding development and update of comprehensive plan of care as evidenced by provider attestation and co-signature . Inter-disciplinary care team collaboration (see longitudinal plan of care) . Medication review performed; medication list updated in electronic medical record. 11-14-2020: Reviewed and changes noted from recent neurology appointment  .  Inter-disciplinary care team collaboration (see longitudinal plan of care) . Referred to pharmacy team for assistance with HLD medication management . Evaluation of current treatment plan related to HLD and patient's adherence to plan as established by provider. 11-14-2020: The patients daughter states that the patient is eating well and she is monitoring her intake.  . Advised patient to call the office for changes in conditions or questions. 11-14-2020: Review and reinforced  . Provided education to patient re: working with CCM team, referrals in place, and collaboration with pcp . Discussed plans with patient for ongoing care management follow up and provided patient with direct contact information for care management team . Next appointment with pcp on 12-11-2020 at 3 pm   Patient Goals/Self-Care  Activities: . Over the next 120 days, patient will:   - call for medicine refill 2 or 3 days before it runs out - call if I am sick and can't take my medicine - keep a list of all the medicines I take; vitamins and herbals too - learn to read medicine labels - use a pillbox to sort medicine- 11-14-2020: The patients daughter Tara [name] is managing the patients medication - use an alarm clock or phone to remind me to take my medicine - change to whole grain breads, cereal, pasta - drink 6 to 8 glasses of water each day - eat 3 to 5 servings of fruits and vegetables each day - eat 5 or 6 small meals each day - fill half the plate with nonstarchy vegetables - limit fast food meals to no more than 1 per week - manage portion size - prepare main meal at home 3 to 5 days each week - read food labels for fat, fiber, carbohydrates and portion size - be open to making changes - I can manage, know and watch for signs of a heart attack - if I have chest pain, call for help - learn about small changes that will make a big difference - learn my personal risk factors  - active listening utilized - caregiver stress acknowledged - counseling provided - current coping strategies identified - decision-making supported - healthy lifestyle promoted - meditative movement therapy encouraged - mindfulness encouraged - participation in counseling encouraged - problem-solving facilitated - self-reflection promoted - spiritual activities promoted - verbalization of feelings encouraged  Follow Up Plan: Telephone follow up appointment with care management team member scheduled for: 01-17-2021 1030 am     Problem: RNCM: Vision Changes Quality of Life (General Plan of Care)   Priority: High    Long-Range Goal: RNCM: Vision Changes Quality of Life Maintained   Priority: High  Note:   Current Barriers:  Marland Kitchen Knowledge Deficits related to vision changes due to CVA . Care Coordination needs related to resources  in the community  in a patient with changes in vision and loss of vision  . Lacks caregiver support.  . Film/video editor.  . Transportation barriers . Cognitive Deficits . Unable to independently manage care due to changes in vision . Unable to self administer medications as prescribed . Does not attend all scheduled provider appointments . Does not adhere to prescribed medication regimen . Lacks social connections . Unable to perform ADLs independently . Unable to perform IADLs independently . Does not maintain contact with provider office . Does not contact provider office for questions/concerns  Nurse Case Manager Clinical Goal(s):  Marland Kitchen Over the next 120 days, patient will verbalize understanding of plan for effective management of meeting needs in patient  with vision changes  . Over the next 120 days, patient will work with [facility], [employer] team, and specialist  to address needs related to vision loss  . Over the next 120 days, patient will attend all scheduled medical appointments: eye specialist on 08-10-2020, appointment completed. The patient declared legally blind Interventions:  . 1:1 collaboration with Valerie Roys, DO regarding development and update of comprehensive plan of care as evidenced by provider attestation and co-signature . Inter-disciplinary care team collaboration (see longitudinal plan of care) . Evaluation of current treatment plan related to loss of vision  and patient's adherence to plan as established by provider. 08-16-2020: Saw eye specialist last week. Was diagnosed with cortical blindness. The patient if not handling well, but has support from her daughter. 09-19-2020: The daughter states things are about the same. They are adjusting to the new diagnosis and are taking things a day at a time. 11-14-2020: The patients daughter states the patient is not seeing well at all. She gets frustrated and sometimes thinks she has different shoes on and the daughter has to  redirect her. She is dealing with it the best she can and it is on a day to day basis.  . Advised patient to keep appointment with eye doctor, call office for changes in condition  . Provided education to patient re: services for the blind and working with resources to help with patients expressed needs due to vision loss. 08-16-2020: Review of resources available for services for the blind. This is all new to the daughter and the patient. Will continue to monitor for changes and needs. 09-19-2020: The daughter has resources. Also sending text message so the daughter can sign up for my chart to view notes and records. Text message sent to [phone]. Education provided for the daughter.  11-14-2020: The patients daughter is set up for my chart now and can view notes.  . Discussed plans with patient for ongoing care management follow up and provided patient with direct contact information for care management team . Reviewed scheduled/upcoming provider appointments including: eye specialist on 08-15-2020 at 0900 am. 11-14-2020: Will see pcp on 12-11-2020 at 3 pm, has upcoming sleep study in May.  Patient Goals/Self-Care Activities Over the next 120 days, patient will:  - Patient will self administer medications as prescribed Patient will attend all scheduled provider appointments Patient will call pharmacy for medication refills Patient will continue to perform ADL's independently Patient will continue to perform IADL's independently Patient will call provider office for new concerns or questions Patient will work with BSW to address care coordination needs and will continue to work with the clinical team to address health care and disease management related needs.    - affirmation provided - community involvement promoted - expression of thoughts about present/future encouraged - independence in all possible areas promoted - patient strengths promoted - self-expression encouraged - sleep hygiene  techniques encouraged - social relationships promoted Follow Up Plan: Telephone follow up appointment with care management team member scheduled for: 6-29--2022 at 35 am       Care Plan : RNCM: Stroke (Adult)  Updates made by Vanita Ingles since 11/14/2020 12:00 AM    Problem: RNCM: Emotional Adjustment to Disease (Stroke)   Priority: High    Long-Range Goal: RNCM: Effective management of stroke   Priority: High  Note:   Current Barriers:  Marland Kitchen Knowledge Deficits related to effective management of s/p stroke and TIas . Care Coordination needs related to community resources and  expressed needs  in a patient with past stroke and changes in condition related to TIAs and mini-strokes  . Chronic Disease Management support and education needs related to management of stroke and prevention of new HA and/orstroke  . Lacks caregiver support.  . Film/video editor.  . Transportation barriers . Cognitive Deficits . Unable to independently manage care due to "mini-strokes" and TIAs . Unable to self administer medications as prescribed . Lacks social connections . Unable to perform ADLs independently . Unable to perform IADLs independently . Does not contact provider office for questions/concerns  Nurse Case Manager Clinical Goal(s):  Marland Kitchen Over the next 120 days, patient will verbalize understanding of plan for effective management of CVA and residual effects from TIAs  . Over the next 120 days, patient will work with [facility], CCM team and pcp to address needs related to **care of patient in the home with post residual deficits and decline in condition . Over the next 120 days, patient will demonstrate a decrease in TIA episodes  exacerbations as evidenced by no new CVA events  . Over the next 120 days, patient will work with CM team pharmacist to reconcile medications, polypharmacy and dubplications  . Over the next 120 days, patient will work with care guides  (community agency) to help with  expressed needs: transportation, food resources, and community resources   Interventions:  . 1:1 collaboration with Valerie Roys, DO regarding development and update of comprehensive plan of care as evidenced by provider attestation and co-signature . Inter-disciplinary care team collaboration (see longitudinal plan of care) . Provided education to patient re: talking to care guides about resources, calling insurance to inquire about life alert, working with CCM team to meet expressed needs  . Reviewed medications with patient and discussed compliance and pharmacy referral. 08-16-2020: The pharmacist has reviewed with the daughter medications list and the daughter is more at ease with medications. The pharmacist will continue to work with the patients daughter. 11-14-2020: The daughter Tara [name] denies any medication concerns today. Review of medication and daughter updated the RNCM on changes recently from the neurology. Nash Dimmer with pcp and pharmacy  regarding medication question and concerns. 11-14-2020: The patients daughter is managing medications and has not new questions at this time. Will continue to follow. . Discussed plans with patient for ongoing care management follow up and provided patient with direct contact information for care management team . Care Guide referral for food, transportation, and community resources  . Social Work referral for needs in the home, placement questions- currently working with LCSW . Pharmacy referral for polypharmacy, medication reconciliation, duplicate medications   Patient Goals/Self-Care Activities Over the next 120 days, patient will:  - Patient will self administer medications as prescribed Patient will attend all scheduled provider appointments Patient will call pharmacy for medication refills Patient will continue to perform ADL's independently Patient will continue to perform IADL's independently Patient will call provider office for new  concerns or questions Patient will work with BSW to address care coordination needs and will continue to work with the clinical team to address health care and disease management related needs.   - caregiver stress acknowledged - caregiver support provided - counseling provided - decision-making supported - depression screen reviewed - family care conference arranged - financial counseling provided - goal-setting facilitated - positive reinforcement provided - problem-solving facilitated - self-care encouraged - self-reflection promoted - verbalization of feelings encouraged   Follow Up Plan: Telephone follow up appointment with care management team  member scheduled for: 01-17-2021 at 39 am       Care Plan : RNCM: Hypertension (Adult)  Updates made by Vanita Ingles since 11/14/2020 12:00 AM    Problem: RNCM: Hypertension (Hypertension)   Priority: Medium    Long-Range Goal: RNCM: Hypertension Monitored   Priority: Medium  Note:   Objective:  . Last practice recorded BP readings:  . BP Readings from Last 3 Encounters: .  10/10/20 . 130/70 .  09/12/20 . 135/77 .  08/04/20 . 135/65 .     Marland Kitchen Most recent eGFR/CrCl: No results found for: EGFR  No components found for: CRCL Current Barriers:  Marland Kitchen Knowledge Deficits related to basic understanding of hypertension pathophysiology and self care management . Knowledge Deficits related to understanding of medications prescribed for management of hypertension . Non-adherence to prescribed medication regimen . Non-adherence to scheduled provider appointments . Transportation barriers . Cognitive Deficits . Limited Social Support . Film/video editor.  . Unable to independently manage HTN . Unable to self administer medications as prescribed . Does not attend all scheduled provider appointments . Does not adhere to prescribed medication regimen . Lacks social connections . Unable to perform ADLs independently . Unable to  perform IADLs independently . Does not maintain contact with provider office . Does not contact provider office for questions/concerns Case Manager Clinical Goal(s):  Marland Kitchen Over the next 120 days, patient will verbalize understanding of plan for hypertension management . Over the next 120 days, patient will attend all scheduled medical appointments: will see pcp in the next few weeks. Knows to call for an appointment  . Over the next 120 days, patient will demonstrate improved adherence to prescribed treatment plan for hypertension as evidenced by taking all medications as prescribed, monitoring and recording blood pressure as directed, adhering to low sodium/DASH diet . Over the next 120 days, patient will demonstrate improved health management independence as evidenced by checking blood pressure as directed and notifying PCP if SBP>160 or DBP > 90, taking all medications as prescribe, and adhering to a low sodium diet as discussed. Interventions:  . Collaboration with Valerie Roys, DO regarding development and update of comprehensive plan of care as evidenced by provider attestation and co-signature . Inter-disciplinary care team collaboration (see longitudinal plan of care) . UNABLE to independently:manage HTN . Evaluation of current treatment plan related to hypertension self management and patient's adherence to plan as established by provider. 08-16-2020: Home health is coming into the home now working with the patient. Nurse has been there, pt working with the patient. OT ordered by home health provider. 11-14-2020: The patients daughter states that it is a day to day situation. She says things are about the same.  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 The patients daughter says she is doing what she needs to do to care for the patient. Feels the medication changes has been helpful and the patient is sleeping better. She is still having tremors. Continues to work with neurologist at this time. Will  see the pcp on 12-11-2020: Recommended writing down questions to ask the pcp at her visit. Marland Kitchen Provided education to patient re: stroke prevention, s/s of heart attack and stroke, DASH diet, complications of uncontrolled blood pressure . Reviewed medications with patient and discussed importance of compliance. 08-16-2020: The daughter is more at ease about the patients medications at this time. 11-14-2020: The patients daughter is managing medications for the patient. She is compliant with the current regimen.  . Discussed plans with patient  for ongoing care management follow up and provided patient with direct contact information for care management team . Advised patient, providing education and rationale, to monitor blood pressure daily and record, calling PCP for findings outside established parameters.  Patient Goals/Self-Care Activities . Over the next 120 days, patient will:  - UNABLE to independently manage HTN Self administers medications as prescribed Attends all scheduled provider appointments Calls provider office for new concerns, questions, or BP outside discussed parameters Checks BP and records as discussed Follows a low sodium diet/DASH diet- compliant with dietary restrictions  - blood pressure trends reviewed - depression screen reviewed - home or ambulatory blood pressure monitoring encouraged Follow Up Plan: Telephone follow up appointment with care management team member scheduled for: 01-17-2021 at 25 am   Care Plan : RNCM: Depression (Adult)  Updates made by Vanita Ingles since 11/14/2020 12:00 AM    Problem: RNCM: Depression Identification (Depression)   Priority: Medium    Long-Range Goal: RNCM: Depressive Symptoms Identified   Priority: Medium  Note:   Current Barriers:  Marland Kitchen Knowledge Deficits related to depression and anxiety  . Chronic Disease Management support and education needs related to depression and anxiety  . Lacks caregiver support.  . Museum/gallery exhibitions officer.  . Transportation barriers . Non-adherence to scheduled provider appointments . Non-adherence to prescribed medication regimen . Unable to independently manage depression and anxiety  . Unable to self administer medications as prescribed . Does not attend all scheduled provider appointments . Does not adhere to prescribed medication regimen . Lacks social connections . Unable to perform ADLs independently . Unable to perform IADLs independently . Does not maintain contact with provider office . Does not contact provider office for questions/concerns  Nurse Case Manager Clinical Goal(s):  Marland Kitchen Over the next 120 days, patient will verbalize understanding of plan for management of depression and anxiety . Over the next 120 days, patient will work with [place], [place], and pcp to address needs related to depression and anxiety . Over the next 120 days, patient will attend all scheduled medical appointments: will call for appointment with pcp  Interventions:  . 1:1 collaboration with Valerie Roys, DO regarding development and update of comprehensive plan of care as evidenced by provider attestation and co-signature . Inter-disciplinary care team collaboration (see longitudinal plan of care) . Evaluation of current treatment plan related to anxiety and depression  and patient's adherence to plan as established by provider. 08-16-2020: Saw neurologist and eye specialist. 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 Will need CCM team support. 11-14-2020: Tara [name] states that they are taking it day by day. She has good days and not so good days. The daughter is with the patient 24/7, she can tell when she is having an off day. 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 The daughter knows to call for changes. States some days are worse than other with her depression. She is having a good day today. Working with LCSW also. Knows resources are available.  . Provided education to patient  and/or caregiver about advanced directives . Provided education to patient re: calling the office for changes in mood/anxiety/depression . Reviewed medications with patient and discussed compliance, poly pharmacy, and duplications- pharmacy referral  . Collaborated with RNCM, pcp, and CCM team regarding patients expressed needs  . Discussed plans with patient for ongoing care management follow up and provided patient with direct contact information for care management team . Reviewed scheduled/upcoming provider appointments including: will see the pcp  in the next few weeks, knows to call for changes.  Next appointment on 12-11-2020 at 3 pm  Patient Goals/Self-Care Activities Over the next 120 days, patient will:  - Patient will self administer medications as prescribed Patient will attend all scheduled provider appointments Patient will call pharmacy for medication refills Patient is dependent on daughter and others to help meet her ADLS/IADLS Patient will call provider office for new concerns or questions Patient will work with BSW to address care coordination needs and will continue to work with the clinical team to address health care and disease management related needs.    - anxiety screen reviewed - depression screen reviewed - medication list reviewed - participation in psychiatric services encouraged - substance use assessed - substance use risk screen reviewed Follow Up Plan: Telephone follow up appointment with care management team member scheduled for: 01-17-2021 at 1030  am         Plan:Telephone follow up appointment with care management team member scheduled for:  01-17-2021 at 11 am   [place], MSN, [facility] Mobile: [phone]

## 2020-11-20 ENCOUNTER — Ambulatory Visit (INDEPENDENT_AMBULATORY_CARE_PROVIDER_SITE_OTHER): Payer: Federal, State, Local not specified - PPO | Admitting: Licensed Clinical Social Worker

## 2020-11-20 DIAGNOSIS — I739 Peripheral vascular disease, unspecified: Secondary | ICD-10-CM

## 2020-11-20 DIAGNOSIS — I10 Essential (primary) hypertension: Secondary | ICD-10-CM

## 2020-11-20 DIAGNOSIS — F419 Anxiety disorder, unspecified: Secondary | ICD-10-CM

## 2020-11-23 NOTE — Patient Instructions (Signed)
Visit Information  Goals Addressed            This Visit's Progress   . SW-Matintain My Quality of Life       Timeframe:  Long-Range Goal Priority:  Medium Start Date: 10/02/20                          Expected End Date: 03/20/21                   Follow Up Date -01/29/21   Patient Goals/Self-Care Activities: Over the next 120 days . Continue with utilizing strong support system . Continue with following up with medical appointments . Continue with medication management . Review resources provided        Patient verbalizes understanding of instructions provided today.   Telephone follow up appointment with care management team member scheduled for: 01/29/21  Jenel Lucks, MSW, LCSW [facility] Care Management [facility]  [facility] [email or web address] Phone [phone] 8:52 AM

## 2020-11-23 NOTE — Chronic Care Management (AMB) (Signed)
Chronic Care Management    Clinical Social Work Note  11/23/2020 Name: Tara [name] MRN: [id number] DOB: 1945/10/19  Tara [name] is a 75 y.o. year old female who is a primary care patient of Tara Carrow, DO. The CCM team was consulted to assist the patient with chronic disease management and/or care coordination needs related to: Level of Care Concerns and Mental Health Counseling and Resources.   Engaged with patient's daughter, Tara [name], by telephone for follow up visit in response to provider referral for social work chronic care management and care coordination services.   Consent to Services:  The patient was given information about Chronic Care Management services, agreed to services, and gave verbal consent prior to initiation of services.  Please see initial visit note for detailed documentation.   Patient agreed to services and consent obtained.   Assessment: Patient's daughter, Tara [name], provided all information during this encounter. Patient is making progress with management of depression and anxiety symptoms , but continues to have difficulty with adjusting to chronic health conditions, such as, blindness. Tara [name] continues to stay in the home 6 days a week to provide support to assist pt in management of health conditions. See Care Plan below for interventions and patient self-care actives. Recent life changes Tara [name]: Chronic health conditions Recommendation: Patient may benefit from, and is in agreement to continue compliance with medication management and utilization of healthy coping skills.  Follow up Plan: Patient would like continued follow-up.  CCM LCSW will follow up with patient on 01/29/21. Patient will call office if needed prior to next encounter.   SDOH (Social Determinants of Health) assessments and interventions performed:    Advanced Directives Status: Not addressed in this encounter.  CCM Care Plan  No Known Allergies  Outpatient Encounter  Medications as of 11/20/2020  Medication Sig  . ALPRAZolam (XANAX) 0.5 MG tablet Take 1 tablet (0.5 mg total) by mouth daily as needed for anxiety.  Marland Kitchen amLODipine (NORVASC) 10 MG tablet Take 1 tablet (10 mg total) by mouth daily.  Marland Kitchen aspirin EC 81 MG tablet Take 1 tablet (81 mg total) by mouth daily.  . B-D TB SYRINGE 1CC/27GX1/2" 27G X 1/2" 1 ML MISC USE AS DIRECTED EVERY 30 DAYS  . citalopram (CELEXA) 40 MG tablet Take 1 tablet (40 mg total) by mouth daily.  . clopidogrel (PLAVIX) 75 MG tablet Take 1 tablet (75 mg total) by mouth daily.  . cyanocobalamin (,VITAMIN B-12,) 1000 MCG/ML injection ADMINISTER 1 ML(1000 MCG) IN THE MUSCLE EVERY 30 DAYS  . diclofenac Sodium (VOLTAREN) 1 % GEL APPLY 4 GRAMS EXTERNALLY TO THE AFFECTED AREA FOUR TIMES DAILY  . famotidine (PEPCID) 20 MG tablet TAKE 1 TABLET(20 MG) BY MOUTH AT BEDTIME  . fenofibrate micronized (LOFIBRA) 200 MG capsule TAKE 1 CAPSULE(200 MG) BY MOUTH DAILY  . Insulin Syringe 27G X 1/2" 1 ML MISC 1 each by Does not apply route every 30 (thirty) days.  . isosorbide mononitrate (IMDUR) 60 MG 24 hr tablet Take 1 tablet (60 mg total) by mouth in the morning and at bedtime.  . lamoTRIgine (LAMICTAL) 25 MG tablet Take by mouth.  . lidocaine (LIDODERM) 5 % 2 patches daily.  Marland Kitchen lisinopril (ZESTRIL) 40 MG tablet TAKE 1 TABLET(40 MG) BY MOUTH TWICE DAILY  . loperamide (IMODIUM) 2 MG capsule Take 2 mg by mouth as needed for diarrhea or loose stools.  . nitroGLYCERIN (NITROSTAT) 0.4 MG SL tablet Place 1 tablet (0.4 mg total) under the tongue every 5 (five)  minutes as needed for chest pain.  Marland Kitchen nystatin ointment (MYCOSTATIN) Apply 1 application topically 2 (two) times daily.  Marland Kitchen omeprazole (PRILOSEC) 40 MG capsule TAKE 1 CAPSULE BY MOUTH TWICE DAILY  . ondansetron (ZOFRAN) 4 MG tablet Take 1 tablet (4 mg total) by mouth every 6 (six) hours as needed for nausea or vomiting.  Marland Kitchen oxyCODONE-acetaminophen (PERCOCET/ROXICET) 5-325 MG tablet Take 1 tablet by mouth 4  (four) times daily as needed.  . pregabalin (LYRICA) 75 MG capsule 1 cap qHS for 1 week, then increase to 1 cap BID for 1 week, then 1 cap qAM and 2 caps qPM for 1 week, then 2 caps BID until you see me  . rosuvastatin (CRESTOR) 40 MG tablet Take 1 tablet (40 mg total) by mouth daily.  . traZODone (DESYREL) 50 MG tablet Take 1 tablet (50 mg total) by mouth at bedtime.   No facility-administered encounter medications on file as of 11/20/2020.    Patient Active Problem List   Diagnosis Date Noted  . 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 S/P stroke due to cerebrovascular disease 08/06/2020  . Polyneuropathy, unspecified 12/08/2019  . Difficulty sleeping 10/29/2019  . Mild cognitive impairment 10/29/2019  . Right leg numbness 10/29/2019  . Lymphedema 10/26/2019  . Atherosclerosis of native arteries of extremity with intermittent claudication (HCC) 10/26/2019  . Nerve pain 10/12/2019  . Memory loss 10/12/2019  . PAD (peripheral artery disease) (HCC) 08/02/2019  . Temporal arteritis (HCC)   . Other intervertebral disc degeneration, thoracic region 06/21/2019  . GERD (gastroesophageal reflux disease) 06/21/2019  . Angiodysplasia of intestinal tract   . Intestinal bypass or anastomosis status   . History of aortic valve replacement 02/07/2019  . B12 deficiency 02/07/2019  . Anxiety 02/05/2019  . HTN (hypertension) 02/05/2019  . Hyperlipidemia 02/05/2019  . IBS (irritable bowel syndrome) 02/05/2019  . Arthritis of both knees 02/05/2019  . CTS (carpal tunnel syndrome) 02/05/2019    Conditions to be addressed/monitored: Anxiety and Depression; Level of care concerns and Mental Health Concerns   Care Plan : General Social Work (Adult)  Updates made by Bridgett Larsson, LCSW since 11/23/2020 12:00 AM    Problem: Coping Skills (General Plan of Care)     Long-Range Goal: Coping Skills Enhanced   Start Date: 07/31/2020  This Visit's Progress: On  track  Priority: Medium  Note:     Timeframe:  Long-Range Goal Priority:  Medium Start Date: 10/02/20                          Expected End Date: 03/20/21                   Follow Up Date -01/29/21   Current barriers:  Patient unable to consistently perform activities of daily living and needs additional assistance and support in order to meet this unmet need . Limited social support, Level of care concerns, ADL IADL limitations, Social Isolation, Limited access to caregiver, Cognitive Deficits, Memory Deficits, and Blind . Lacks social connections  Clinical Goals:   . Over the next 120 days, patient will have additional in home support education provided to both patient and daughter (caregiver) in order to increase support and safety within the home . Over the next 120 days, patient/caregiver will work with SW to address concerns related to care coordination needs, lack of a stable support network and lack of Economist. LCSW will assist patient  in gaining additional support in order to maintain health and mental health appropriately  . Over the next 120 days, patient will demonstrate improved adherence to self care as evidenced by implementing healthy self-care into her daily routine such as: attending all medical appointments, deep breathing exercises, taking time for self-reflection, taking medications as prescribed, drinking water and daily exercise to improve mobility and mood.  . Over the next 120 days, patient will demonstrate improved health management independence as evidenced by implementing healthy self-care skills and positive support/resource implementation into their daily routine to help cope with stressors and improve overall health and well-being  . Over the next 120 days, patient or caregiver will verbalize basic understanding of depression/stress process and self health management plan as evidenced by her participation in development of long term  plan of care and institution of self health management strategies  Interventions : . Assessed needs, level of care concerns, and barriers to treatment . Patient interviewed and appropriate assessments performed . Pt's daughter shared that pt's depression and anxiety symptoms (triggered by grief of eyesight and ongoing medical conditions) fluctuate from "day to day" Pt is compliant with Celexa and reports no adverse side effects . Pt completed session at [facility] last Tuesday, April 26, 22 . Patient continues to talk over the phone to her sister, friend (Ms. Dottie) and cousin.  . Patient is not interested in therapy or support groups at this time. CCM LCSW informed family of the Stroke Support Group for patient and/or family to attend through [facility].  . Pt's daughter disclosed that her parents are thinking of relocating to [place] in June 2022 to reside with granddaughter's family. They are currently expanding on the residential property. This in turn will promote pt's safety but also independence. Pt's daughter agreed to keep CCM team and PCP informed . Discussed plans with patient for ongoing care management follow up and provided patient with direct contact information for care management team . Other interventions provided: Solution-Focused Strategies Patient Goals/Self-Care Activities: Over the next 120 days . Continue with utilizing strong support system . Continue with following up with medical appointments . Continue with medication management . Review resources provided      Jenel Lucks, MSW, LCSW [facility] Care Management   [facility] [email or web address] Phone [phone] 8:50 AM

## 2020-11-27 ENCOUNTER — Other Ambulatory Visit: Payer: Self-pay | Admitting: Family Medicine

## 2020-11-27 NOTE — Telephone Encounter (Signed)
Requested Prescriptions  Pending Prescriptions Disp Refills  . citalopram (CELEXA) 40 MG tablet [Pharmacy Med Name: CITALOPRAM HBR 40 MG TABLET] 30 tablet 0    Sig: Take 1 tablet (40 mg total) by mouth daily.     Psychiatry:  Antidepressants - SSRI Passed - 11/27/2020  1:20 PM      Passed - Valid encounter within last 6 months    Recent Outpatient Visits          2 months ago Acute recurrent frontal sinusitis   [facility] Clifford, [place], DO   3 months ago Anxiety   [facility] Amelia, [place], DO   11 months ago Polyneuropathy, unspecified   [facility] [place], [place], DO   1 year ago Essential hypertension   [facility] [place], [place], DO   1 year ago Memory loss   [employer], [place], DO      Future Appointments            In 2 weeks Laural Benes, Oralia Rud, DO [employer] [facility], [facility]   In 2 months Kirke Corin, Chelsea Aus, MD [facility], [facility]

## 2020-12-04 ENCOUNTER — Telehealth (INDEPENDENT_AMBULATORY_CARE_PROVIDER_SITE_OTHER): Payer: Self-pay | Admitting: Vascular Surgery

## 2020-12-04 NOTE — Telephone Encounter (Signed)
Patient called stating that her right leg is swollen, red, and experiencing pain. Patient states she has been using her lymph pump, taking water pills daily and elevating her legs and nothing has worked. Patient would like to come in to be seen. Patient was last seen 11/2019. Please advise.

## 2020-12-04 NOTE — Telephone Encounter (Signed)
I would say bring her in for ABI and OFC visit . Please advise

## 2020-12-04 NOTE — Telephone Encounter (Signed)
ABI is fine but also DVT study

## 2020-12-05 NOTE — Telephone Encounter (Signed)
Called and scheduled patient

## 2020-12-05 NOTE — Telephone Encounter (Signed)
Could you please schedule the pt for the following study's below and a visit.

## 2020-12-11 ENCOUNTER — Ambulatory Visit (INDEPENDENT_AMBULATORY_CARE_PROVIDER_SITE_OTHER): Payer: Federal, State, Local not specified - PPO | Admitting: Family Medicine

## 2020-12-11 ENCOUNTER — Other Ambulatory Visit: Payer: Self-pay

## 2020-12-11 ENCOUNTER — Encounter: Payer: Self-pay | Admitting: Family Medicine

## 2020-12-11 VITALS — BP 134/76 | HR 76

## 2020-12-11 DIAGNOSIS — Z720 Tobacco use: Secondary | ICD-10-CM

## 2020-12-11 DIAGNOSIS — I10 Essential (primary) hypertension: Secondary | ICD-10-CM | POA: Diagnosis not present

## 2020-12-11 DIAGNOSIS — G629 Polyneuropathy, unspecified: Secondary | ICD-10-CM

## 2020-12-11 DIAGNOSIS — F419 Anxiety disorder, unspecified: Secondary | ICD-10-CM

## 2020-12-11 DIAGNOSIS — K219 Gastro-esophageal reflux disease without esophagitis: Secondary | ICD-10-CM

## 2020-12-11 MED ORDER — FENOFIBRATE 200 MG PO CAPS: ORAL_CAPSULE | ORAL | 1 refills | Status: DC

## 2020-12-11 MED ORDER — LISINOPRIL 40 MG PO TABS: ORAL_TABLET | ORAL | 1 refills | Status: DC

## 2020-12-11 MED ORDER — OMEPRAZOLE 40 MG PO CPDR: DELAYED_RELEASE_CAPSULE | ORAL | 1 refills | Status: DC

## 2020-12-11 MED ORDER — AMLODIPINE BESYLATE 10 MG PO TABS: 10.0000 mg | ORAL_TABLET | Freq: Every day | ORAL | 1 refills | Status: DC

## 2020-12-11 MED ORDER — FAMOTIDINE 20 MG PO TABS: ORAL_TABLET | ORAL | 1 refills | Status: DC

## 2020-12-11 MED ORDER — PREGABALIN 150 MG PO CAPS: 150.0000 mg | ORAL_CAPSULE | Freq: Two times a day (BID) | ORAL | 3 refills | Status: DC

## 2020-12-11 MED ORDER — NICOTINE 21 MG/24HR TD PT24: 21.0000 mg | MEDICATED_PATCH | Freq: Every day | TRANSDERMAL | 0 refills | Status: DC

## 2020-12-11 MED ORDER — CITALOPRAM HYDROBROMIDE 40 MG PO TABS: 40.0000 mg | ORAL_TABLET | Freq: Every day | ORAL | 0 refills | Status: DC

## 2020-12-11 MED ORDER — BUPROPION HCL ER (SR) 150 MG PO TB12: 150.0000 mg | ORAL_TABLET | Freq: Two times a day (BID) | ORAL | 2 refills | Status: DC

## 2020-12-11 NOTE — Progress Notes (Addendum)
BP 134/76   Pulse 76   SpO2 98%    Subjective:    Patient ID: Tara [name], female    DOB: March 24, 1946, 75 y.o.   MRN: [id number]  HPI: Tara [name] is a 75 y.o. female  Chief Complaint  Patient presents with  . Hypertension  . Gastroesophageal Reflux  . Anxiety   SMOKING CESSATION Smoking Status: daily smoker Smoking Amount: almost a pack a day Smoking Quit Date: none Smoking triggers: boredom, anxiety Type of tobacco use: cigarettes Children in the house: yes Other household members who smoke: yes Treatments attempted: cold [place] Pneumovax: not up to date  ANXIETY/STRESS Duration: chronic Status:stable Anxious mood: yes  Excessive worrying: no Irritability: no  Sweating: no Nausea: no Palpitations:no Hyperventilation: no Panic attacks: no Agoraphobia: no  Obscessions/compulsions: no Depressed mood: yes Depression screen [facility] 2/9 12/11/2020 08/08/2020 08/04/2020 04/22/2019 02/05/2019  Decreased Interest 0 0 0 2 2  Down, Depressed, Hopeless 1 3 0 2 2  PHQ - 2 Score 1 3 0 4 4  Altered sleeping 2 0 0 3 3  Tired, decreased energy 2 3 3 3 2   Change in appetite 1 0 0 2 3  Feeling bad or failure about yourself  3 0 0 2 2  Trouble concentrating 3 3 1 2 1   Moving slowly or fidgety/restless 3 3 0 0 2  Suicidal thoughts 1 0 0 0 0  PHQ-9 Score 16 12 4 16 17   Difficult doing work/chores Not difficult at all Very difficult Somewhat difficult Extremely dIfficult Very difficult   GAD 7 : Generalized Anxiety Score 12/11/2020 08/04/2020 04/22/2019 02/05/2019  Nervous, Anxious, on Edge 1 1 3 3   Control/stop worrying 1 0 3 3  Worry too much - different things 2 0 3 3  Trouble relaxing 1 0 3 3  Restless 0 0 2 2  Easily annoyed or irritable 2 1 3 3   Afraid - awful might happen 0 0 3 1  Total GAD 7 Score 7 2 20 18   Anxiety Difficulty Not difficult at all Somewhat difficult Very difficult Very difficult   Anhedonia: no Weight changes: no Insomnia: no    Hypersomnia: no Fatigue/loss of energy: yes Feelings of worthlessness: no Feelings of guilt: no Impaired concentration/indecisiveness: no Suicidal ideations: no  Crying spells: no Recent Stressors/Life Changes: no   Relationship problems: no   Family stress: no     Financial stress: no    Job stress: no    Recent death/loss: no  HYPERTENSION Hypertension status: controlled  Satisfied with current treatment? yes Duration of hypertension: chronic BP monitoring frequency:  not checking BP medication side effects:  no Medication compliance: good compliance Aspirin: yes Recurrent headaches: no Visual changes: no Palpitations: no Dyspnea: no Chest pain: no Lower extremity edema: no Dizzy/lightheaded: no    Relevant past medical, surgical, family and social history reviewed and updated as indicated. Interim medical history since our last visit reviewed. Allergies and medications reviewed and updated.  Review of Systems  Constitutional: Negative.   Respiratory: Negative.   Cardiovascular: Negative.   Gastrointestinal: Negative.   Musculoskeletal: Negative.   Psychiatric/Behavioral: Negative.     Per HPI unless specifically indicated above     Objective:    BP 134/76   Pulse 76   SpO2 98%   Wt Readings from Last 3 Encounters:  10/10/20 172 lb (78 kg)  09/12/20 195 lb (88.5 kg)  08/04/20 185 lb (83.9 kg)    Physical Exam Vitals and  nursing note reviewed.  Pulmonary:     Effort: Pulmonary effort is normal. No respiratory distress.     Comments: Speaking in full sentences Neurological:     Mental Status: She is alert.  Psychiatric:        Mood and Affect: Mood normal.        Behavior: Behavior normal.        Thought Content: Thought content normal.        Judgment: Judgment normal.     Results for orders placed or performed in visit on 09/12/20  Bayer DCA Hb A1c Waived  Result Value Ref Range   HB A1C (BAYER DCA - WAIVED) 6.2 <7.0 %  CBC with  Differential/Platelet  Result Value Ref Range   WBC 6.0 3.4 - 10.8 x10E3/uL   RBC 4.43 3.77 - 5.28 x10E6/uL   Hemoglobin 12.7 11.1 - 15.9 g/dL   Hematocrit 63.0 16.0 - 46.6 %   MCV 86 79 - 97 fL   MCH 28.7 26.6 - 33.0 pg   MCHC 33.3 31.5 - 35.7 g/dL   RDW 10.9 32.3 - 55.7 %   Platelets 176 150 - 450 x10E3/uL   Neutrophils 65 Not Estab. %   Lymphs 26 Not Estab. %   Monocytes 8 Not Estab. %   Eos 1 Not Estab. %   Basos 0 Not Estab. %   Neutrophils Absolute 3.8 1.4 - 7.0 x10E3/uL   Lymphocytes Absolute 1.6 0.7 - 3.1 x10E3/uL   Monocytes Absolute 0.5 0.1 - 0.9 x10E3/uL   EOS (ABSOLUTE) 0.1 0.0 - 0.4 x10E3/uL   Basophils Absolute 0.0 0.0 - 0.2 x10E3/uL   Immature Granulocytes 0 Not Estab. %   Immature Grans (Abs) 0.0 0.0 - 0.1 x10E3/uL  Comprehensive metabolic panel  Result Value Ref Range   Glucose 131 (H) 65 - 99 mg/dL   BUN 27 8 - 27 mg/dL   Creatinine, Ser 3.22 0.57 - 1.00 mg/dL   GFR calc non Af Amer 60 >59 mL/min/1.73   GFR calc Af Amer 69 >59 mL/min/1.73   BUN/Creatinine Ratio 28 12 - 28   Sodium 143 134 - 144 mmol/L   Potassium 4.1 3.5 - 5.2 mmol/L   Chloride 107 (H) 96 - 106 mmol/L   CO2 19 (L) 20 - 29 mmol/L   Calcium 9.2 8.7 - 10.3 mg/dL   Total Protein 6.4 6.0 - 8.5 g/dL   Albumin 4.2 3.7 - 4.7 g/dL   Globulin, Total 2.2 1.5 - 4.5 g/dL   Albumin/Globulin Ratio 1.9 1.2 - 2.2   Bilirubin Total 0.2 0.0 - 1.2 mg/dL   Alkaline Phosphatase 68 44 - 121 IU/L   AST 17 0 - 40 IU/L   ALT 11 0 - 32 IU/L  Lipid Panel w/o Chol/HDL Ratio  Result Value Ref Range   Cholesterol, Total 113 100 - 199 mg/dL   Triglycerides 025 0 - 149 mg/dL   HDL 44 >42 mg/dL   VLDL Cholesterol Cal 19 5 - 40 mg/dL   LDL Chol Calc (NIH) 50 0 - 99 mg/dL  TSH  Result Value Ref Range   TSH 2.400 0.450 - 4.500 uIU/mL      Assessment & Plan:   Problem List Items Addressed This Visit      Cardiovascular and Mediastinum   HTN (hypertension)    Under good control on current regimen. Continue  current regimen. Continue to monitor. Call with any concerns. Refills given.        Relevant Medications  amLODipine (NORVASC) 10 MG tablet   fenofibrate micronized (LOFIBRA) 200 MG capsule   lisinopril (ZESTRIL) 40 MG tablet     Digestive   GERD (gastroesophageal reflux disease)    Under good control on current regimen. Continue current regimen. Continue to monitor. Call with any concerns. Refills given.       Relevant Medications   famotidine (PEPCID) 20 MG tablet   omeprazole (PRILOSEC) 40 MG capsule     Nervous and Auditory   Polyneuropathy, unspecified    Under good control on current regimen. Continue current regimen. Continue to monitor. Call with any concerns. Refills given.       Relevant Medications   nicotine (NICODERM CQ) 21 mg/24hr patch   buPROPion (WELLBUTRIN SR) 150 MG 12 hr tablet   citalopram (CELEXA) 40 MG tablet   pregabalin (LYRICA) 150 MG capsule     Other   Anxiety    Under good control on current regimen. Continue current regimen. Continue to monitor. Call with any concerns. Refills given.       Relevant Medications   buPROPion (WELLBUTRIN SR) 150 MG 12 hr tablet   citalopram (CELEXA) 40 MG tablet   Tobacco abuse - Primary    Will start nicoderm and wellbutrin. Recheck 1 month. Call with any concerns. Continue to monitor.           Follow up plan: Return in about 4 weeks (around 01/08/2021).    . This visit was completed via telephone due to the restrictions of the COVID-19 pandemic. All issues as above were discussed and addressed but no physical exam was performed. If it was felt that the patient should be evaluated in the office, they were directed there. The patient verbally consented to this visit. Patient was unable to complete an audio/visual visit due to Lack of equipment. Due to the catastrophic nature of the COVID-19 pandemic, this visit was done through audio contact only. . Location of the patient: home . Location of the provider:  work . Those involved with this call:  . Provider: Olevia Perches, DO . CMA: Rolley Sims, CMA . Front Desk/Registration: Beverely Pace  . Time spent on call: 25 minutes on the phone discussing health concerns. 30 minutes total spent in review of patient's record and preparation of their chart.

## 2020-12-11 NOTE — Assessment & Plan Note (Signed)
Under good control on current regimen. Continue current regimen. Continue to monitor. Call with any concerns. Refills given.   

## 2020-12-11 NOTE — Assessment & Plan Note (Signed)
Will start nicoderm and wellbutrin. Recheck 1 month. Call with any concerns. Continue to monitor.

## 2020-12-12 ENCOUNTER — Other Ambulatory Visit (INDEPENDENT_AMBULATORY_CARE_PROVIDER_SITE_OTHER): Payer: Self-pay | Admitting: Nurse Practitioner

## 2020-12-12 DIAGNOSIS — M79604 Pain in right leg: Secondary | ICD-10-CM

## 2020-12-12 DIAGNOSIS — L819 Disorder of pigmentation, unspecified: Secondary | ICD-10-CM

## 2020-12-12 DIAGNOSIS — M7989 Other specified soft tissue disorders: Secondary | ICD-10-CM

## 2020-12-13 ENCOUNTER — Ambulatory Visit (INDEPENDENT_AMBULATORY_CARE_PROVIDER_SITE_OTHER): Payer: Federal, State, Local not specified - PPO

## 2020-12-13 ENCOUNTER — Encounter (INDEPENDENT_AMBULATORY_CARE_PROVIDER_SITE_OTHER): Payer: Self-pay | Admitting: Nurse Practitioner

## 2020-12-13 ENCOUNTER — Ambulatory Visit (INDEPENDENT_AMBULATORY_CARE_PROVIDER_SITE_OTHER): Payer: Federal, State, Local not specified - PPO | Admitting: Nurse Practitioner

## 2020-12-13 ENCOUNTER — Other Ambulatory Visit: Payer: Self-pay

## 2020-12-13 VITALS — BP 140/75 | HR 66 | Resp 16

## 2020-12-13 DIAGNOSIS — E782 Mixed hyperlipidemia: Secondary | ICD-10-CM | POA: Diagnosis not present

## 2020-12-13 DIAGNOSIS — M7989 Other specified soft tissue disorders: Secondary | ICD-10-CM

## 2020-12-13 DIAGNOSIS — I890 Lymphedema, not elsewhere classified: Secondary | ICD-10-CM | POA: Diagnosis not present

## 2020-12-13 DIAGNOSIS — Z95828 Presence of other vascular implants and grafts: Secondary | ICD-10-CM

## 2020-12-13 DIAGNOSIS — M79604 Pain in right leg: Secondary | ICD-10-CM

## 2020-12-13 DIAGNOSIS — L819 Disorder of pigmentation, unspecified: Secondary | ICD-10-CM

## 2020-12-13 DIAGNOSIS — I10 Essential (primary) hypertension: Secondary | ICD-10-CM

## 2020-12-14 ENCOUNTER — Encounter: Payer: Self-pay | Admitting: Family Medicine

## 2020-12-14 DIAGNOSIS — R32 Unspecified urinary incontinence: Secondary | ICD-10-CM

## 2020-12-24 ENCOUNTER — Encounter (INDEPENDENT_AMBULATORY_CARE_PROVIDER_SITE_OTHER): Payer: Self-pay | Admitting: Nurse Practitioner

## 2020-12-24 NOTE — Progress Notes (Signed)
Subjective:    Patient ID: Tara [name], female    DOB: [birth year], 75 y.o.   MRN: [id number] Chief Complaint  Patient presents with  . Follow-up    Ultrasound follow up    Tara [name] is a 75 year old woman that presents today after having concerning swelling in her right lower extremity.  This is been ongoing for couple of weeks.  The patient has also had a previous history of a bypass graft placed in her right lower extremity.  This bypass was done at a different facility but the patient has had considerable difficulties with this.  Patient does have a lymphedema pump to help with the lymphedema that has occurred after her surgery.  The patient notes she utilizes it daily and it does help with the swelling however the swelling returns.  She notes that she elevates her lower extremity however she has not utilize compression stockings daily.  The patient's daughter notes that the swelling in this instance went from her lower leg up to her thigh.  At this time the swelling has seemingly decreased to below the knee.  She denies any significant claudication or rest pain like symptoms.  She denies any fevers or chills.  She continues to have significant tenderness and pain in the right lower extremity following the bypass.  There is no new wounds or ulcerations.  Today noninvasive studies show an ABI 1.04 on the right and 0.88 on the left.  Patient has triphasic/biphasic waveforms in the bilateral tibial arteries with good toe waveforms bilaterally.  There was also a DVT study done which showed no evidence of DVT.   Review of Systems  Cardiovascular: Positive for leg swelling.  Neurological: Positive for weakness.  All other systems reviewed and are negative.      Objective:   Physical Exam Vitals reviewed.  HENT:     Head: Normocephalic.  Cardiovascular:     Rate and Rhythm: Normal rate.     Pulses: Decreased pulses.  Pulmonary:     Effort: Pulmonary effort is normal.   Musculoskeletal:        General: Tenderness present.     Right lower leg: Edema present.  Skin:    General: Skin is warm and dry.  Neurological:     Mental Status: She is alert and oriented to person, place, and time.     Motor: Weakness present.     Gait: Gait abnormal.  Psychiatric:        Mood and Affect: Mood normal.        Behavior: Behavior normal.        Thought Content: Thought content normal.        Judgment: Judgment normal.     BP 140/75 (BP Location: Left Arm)   Pulse 66   Resp 16   Past Medical History:  Diagnosis Date  . Anxiety   . Arthritis   . Barrett's syndrome   . Carpal tunnel syndrome   . Depression   . GERD (gastroesophageal reflux disease)   . Heart murmur   . Hyperlipidemia   . Hypertension   . Myocardial infarction (HCC)    08  . PAD (peripheral artery disease) (HCC)   . Stroke [facility])     Social History   Socioeconomic History  . Marital status: Married    Spouse name: Not on file  . Number of children: Not on file  . Years of education: Not on file  . Highest education level: Not on file  Occupational History  . Not on file  Tobacco Use  . Smoking status: Light Tobacco Smoker    Packs/day: 0.25    Years: 3.00    Pack years: 0.75    Types: Cigarettes  . Smokeless tobacco: Never Used  . Tobacco comment: " 3 cigarettes per day "-1 pack per week  Vaping Use  . Vaping Use: Never used  Substance and Sexual Activity  . Alcohol use: Never  . Drug use: Never  . Sexual activity: Yes  Other Topics Concern  . Not on file  Social History Narrative  . Not on file   Social Determinants of Health   Financial Resource Strain: High Risk  . Difficulty of Paying Living Expenses: Hard  Food Insecurity: No Food Insecurity  . Worried About Programme researcher, broadcasting/film/video in the Last Year: Never true  . Ran Out of Food in the Last Year: Never true  Transportation Needs: No Transportation Needs  . Lack of Transportation (Medical): No  . Lack of  Transportation (Non-Medical): No  Physical Activity: Inactive  . Days of Exercise per Week: 0 days  . Minutes of Exercise per Session: 0 min  Stress: Stress Concern Present  . Feeling of Stress : Rather much  Social Connections: Moderately Isolated  . Frequency of Communication with Friends and Family: More than three times a week  . Frequency of Social Gatherings with Friends and Family: More than three times a week  . Attends Religious Services: Never  . Active Member of Clubs or Organizations: No  . Attends Banker Meetings: Never  . Marital Status: Married  Catering manager Violence: Not At Risk  . Fear of Current or Ex-Partner: No  . Emotionally Abused: No  . Physically Abused: No  . Sexually Abused: No    Past Surgical History:  Procedure Laterality Date  . ABDOMINAL AORTOGRAM W/LOWER EXTREMITY N/A 07/28/2019   Procedure: ABDOMINAL AORTOGRAM W/LOWER EXTREMITY;  Surgeon: Iran Ouch, MD;  Location: [facility];  Service: Cardiovascular;  Laterality: N/A;  . ABDOMINAL HYSTERECTOMY    . ARTERY BIOPSY Right 07/12/2019   Procedure: BIOPSY TEMPORAL ARTERY;  Surgeon: Henrene Dodge, MD;  Location: [facility] ORS;  Service: General;  Laterality: Right;  . BREAST BIOPSY Left 2007   Neg- [place]  . CARDIAC CATHETERIZATION    . CARDIAC VALVE REPLACEMENT    . CHOLECYSTECTOMY    . COLON SURGERY    . COLONOSCOPY WITH PROPOFOL N/A 04/09/2019   Procedure: COLONOSCOPY WITH PROPOFOL;  Surgeon: Pasty Spillers, MD;  Location: [facility] ENDOSCOPY;  Service: Endoscopy;  Laterality: N/A;  . CORONARY ANGIOPLASTY WITH STENT PLACEMENT  2008   [place]; [facility] Stent placement x 1   . DENTAL SURGERY    . ESOPHAGOGASTRODUODENOSCOPY (EGD) WITH PROPOFOL N/A 04/09/2019   Procedure: ESOPHAGOGASTRODUODENOSCOPY (EGD) WITH PROPOFOL;  Surgeon: Pasty Spillers, MD;  Location: [facility] ENDOSCOPY;  Service: Endoscopy;  Laterality: N/A;  . EXCISIONAL HEMORRHOIDECTOMY    . EYE  SURGERY    . FEMORAL-POPLITEAL BYPASS GRAFT Right 08/02/2019   Procedure: BYPASS GRAFT FEMORAL-POPLITEAL ARTERY;  Surgeon: Larina Earthly, MD;  Location: [facility] OR;  Service: Vascular;  Laterality: Right;  . HERNIA REPAIR    . LOWER EXTREMITY ANGIOGRAPHY Left 11/11/2019   Procedure: LOWER EXTREMITY ANGIOGRAPHY;  Surgeon: Annice Needy, MD;  Location: [facility] INVASIVE CV LAB;  Service: Cardiovascular;  Laterality: Left;  Marland Kitchen MULTIPLE TOOTH EXTRACTIONS    . TONSILLECTOMY      Family  History  Adopted: Yes  Problem Relation Age of Onset  . Breast cancer Mother 25    No Known Allergies  CBC Latest Ref Rng & Units 09/12/2020 11/12/2019 10/01/2019  WBC 3.4 - 10.8 x10E3/uL 6.0 6.5 7.0  Hemoglobin 11.1 - 15.9 g/dL 78.2 16.0(H) 15.4  Hematocrit 34.0 - 46.6 % 38.1 48.2(H) 46.2  Platelets 150 - 450 x10E3/uL 176 170 207      CMP     Component Value Date/Time   NA 143 09/12/2020 1415   K 4.1 09/12/2020 1415   CL 107 (H) 09/12/2020 1415   CO2 19 (L) 09/12/2020 1415   GLUCOSE 131 (H) 09/12/2020 1415   GLUCOSE 163 (H) 08/03/2019 0617   BUN 27 09/12/2020 1415   CREATININE 0.95 09/12/2020 1415   CALCIUM 9.2 09/12/2020 1415   PROT 6.4 09/12/2020 1415   ALBUMIN 4.2 09/12/2020 1415   AST 17 09/12/2020 1415   ALT 11 09/12/2020 1415   ALKPHOS 68 09/12/2020 1415   BILITOT 0.2 09/12/2020 1415   GFRNONAA 60 09/12/2020 1415   GFRAA 69 09/12/2020 1415     VAS [place] ABI WITH/WO TBI  Result Date: 12/19/2020  LOWER EXTREMITY DOPPLER STUDY Patient Name:  Lakyla Biswas Griner  Date of Exam:   12/13/2020 Medical Rec #: [id number]             Accession #:    [id number] Date of Birth: 10-Jun-1946              Patient Gender: F Patient Age:   74Y Exam Location:  [facility] Procedure:      VAS [place] ABI WITH/WO TBI Referring Phys: [id number] Erma Pinto Melonie Germani --------------------------------------------------------------------------------  Indications: Claudication.  Comparison Study: 12/14/2019 Performing Technologist:  Reece Agar RT (R)(VS)  Examination Guidelines: A complete evaluation includes at minimum, Doppler waveform signals and systolic blood pressure reading at the level of bilateral brachial, anterior tibial, and posterior tibial arteries, when vessel segments are accessible. Bilateral testing is considered an integral part of a complete examination. Photoelectric Plethysmograph (PPG) waveforms and toe systolic pressure readings are included as required and additional duplex testing as needed. Limited examinations for reoccurring indications may be performed as noted.  ABI Findings: +---------+------------------+-----+--------+--------+ Right    Rt Pressure (mmHg)IndexWaveformComment  +---------+------------------+-----+--------+--------+ Brachial 180                                     +---------+------------------+-----+--------+--------+ ATA      192               1.04 biphasic         +---------+------------------+-----+--------+--------+ PTA      179               0.97 biphasic         +---------+------------------+-----+--------+--------+ Great Toe110               0.60 Abnormal         +---------+------------------+-----+--------+--------+ +---------+------------------+-----+---------+-------+ Left     Lt Pressure (mmHg)IndexWaveform Comment +---------+------------------+-----+---------+-------+ Brachial 184                                     +---------+------------------+-----+---------+-------+ ATA      162               0.88 triphasic        +---------+------------------+-----+---------+-------+  PTA      139               0.76 biphasic         +---------+------------------+-----+---------+-------+ Great Toe116               0.63 Abnormal         +---------+------------------+-----+---------+-------+ +-------+-----------+-----------+------------+------------+ ABI/TBIToday's ABIToday's TBIPrevious ABIPrevious TBI  +-------+-----------+-----------+------------+------------+ Right  1.04       .60        1.01        .62          +-------+-----------+-----------+------------+------------+ Left   .88        .63        .99         .65          +-------+-----------+-----------+------------+------------+ Right ABIs appear essentially unchanged compared to prior study on 12/14/2019. Left ABIs appear decreased compared to prior study on 12/14/2019. Bilateral TBI appears essentially unchanged as compared to the previous exa on 12/14/2019.  Summary: Right: Resting right ankle-brachial index is within normal range. No evidence of significant right lower extremity arterial disease. The right toe-brachial index is abnormal. 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 The left toe-brachial index is abnormal. *See table(s) above for measurements and observations.  Electronically signed by Festus Barren MD on 12/19/2020 at 12:41:33 PM.    Final        Assessment & Plan:   1. S/P femoropopliteal bypass surgery Patient still continues to have significant neuropathic pain following her bypass surgery.  The patient has utilize gabapentin to no relief.  It is possible that she may gain some relief over time.  2. Lymphedema The patient is advised to continue with conservative therapy tactics.  This includes utilization of medical grade compression stockings daily.  The stockings should be 20 to 30 mmHg at least knee-high.  They should be put on first thing in the morning and remove before bed.  The patient is instructed that she should not sleep in the stockings.  Elevation should be done whenever she is not currently active.  Activity is also helpful when possible.  The patient should also continue usage of her lymphedema pump on a daily basis.  3. Essential hypertension Continue antihypertensive medications as already ordered, these medications have been reviewed and there are no  changes at this time.   4. Mixed hyperlipidemia Continue statin as ordered and reviewed, no changes at this time    Current Outpatient Medications on File Prior to Visit  Medication Sig Dispense Refill  . amLODipine (NORVASC) 10 MG tablet Take 1 tablet (10 mg total) by mouth daily. 90 tablet 1  . aspirin EC 81 MG tablet Take 1 tablet (81 mg total) by mouth daily. 150 tablet 2  . B-D TB SYRINGE 1CC/27GX1/2" 27G X 1/2" 1 ML MISC USE AS DIRECTED EVERY 30 DAYS    . buPROPion (WELLBUTRIN SR) 150 MG 12 hr tablet Take 1 tablet (150 mg total) by mouth 2 (two) times daily. 60 tablet 2  . citalopram (CELEXA) 40 MG tablet Take 1 tablet (40 mg total) by mouth daily. 30 tablet 0  . clopidogrel (PLAVIX) 75 MG tablet Take 1 tablet (75 mg total) by mouth daily. 30 tablet 11  . cyanocobalamin (,VITAMIN B-12,) 1000 MCG/ML injection ADMINISTER 1 ML(1000 MCG) IN THE MUSCLE EVERY 30 DAYS 1 mL 12  . diclofenac Sodium (VOLTAREN) 1 % GEL APPLY 4 GRAMS EXTERNALLY TO  THE AFFECTED AREA FOUR TIMES DAILY 100 g 0  . famotidine (PEPCID) 20 MG tablet TAKE 1 TABLET(20 MG) BY MOUTH AT BEDTIME 90 tablet 1  . fenofibrate micronized (LOFIBRA) 200 MG capsule TAKE 1 CAPSULE(200 MG) BY MOUTH DAILY 90 capsule 1  . Insulin Syringe 27G X 1/2" 1 ML MISC 1 each by Does not apply route every 30 (thirty) days. 10 each 3  . isosorbide mononitrate (IMDUR) 60 MG 24 hr tablet Take 1 tablet (60 mg total) by mouth in the morning and at bedtime. 180 tablet 1  . lamoTRIgine (LAMICTAL) 25 MG tablet Take by mouth.    . lidocaine (LIDODERM) 5 % 2 patches daily.    Marland Kitchen. lisinopril (ZESTRIL) 40 MG tablet TAKE 1 TABLET(40 MG) BY MOUTH TWICE DAILY 180 tablet 1  . loperamide (IMODIUM) 2 MG capsule Take 2 mg by mouth as needed for diarrhea or loose stools.    . nicotine (NICODERM CQ) 21 mg/24hr patch Place 1 patch (21 mg total) onto the skin daily. 28 patch 0  . nitroGLYCERIN (NITROSTAT) 0.4 MG SL tablet Place 1 tablet (0.4 mg total) under the tongue  every 5 (five) minutes as needed for chest pain. 25 tablet 3  . nystatin ointment (MYCOSTATIN) Apply 1 application topically 2 (two) times daily. 30 g 0  . omeprazole (PRILOSEC) 40 MG capsule TAKE 1 CAPSULE BY MOUTH TWICE DAILY 180 capsule 1  . ondansetron (ZOFRAN) 4 MG tablet Take 1 tablet (4 mg total) by mouth every 6 (six) hours as needed for nausea or vomiting. 90 tablet 1  . oxyCODONE-acetaminophen (PERCOCET/ROXICET) 5-325 MG tablet Take 1 tablet by mouth 4 (four) times daily as needed.    . pregabalin (LYRICA) 150 MG capsule Take 1 capsule (150 mg total) by mouth 2 (two) times daily. 60 capsule 3  . rosuvastatin (CRESTOR) 40 MG tablet Take 1 tablet (40 mg total) by mouth daily. 90 tablet 3  . traZODone (DESYREL) 50 MG tablet Take 1 tablet (50 mg total) by mouth at bedtime. 90 tablet 1   No current facility-administered medications on file prior to visit.    There are no Patient Instructions on file for this visit. No follow-ups on file.   Georgiana SpinnerFallon E Albana Saperstein, NP

## 2020-12-28 ENCOUNTER — Ambulatory Visit: Payer: Federal, State, Local not specified - PPO | Admitting: Urology

## 2020-12-29 ENCOUNTER — Encounter: Payer: Self-pay | Admitting: Urology

## 2021-01-07 ENCOUNTER — Inpatient Hospital Stay: Payer: Medicare Other

## 2021-01-07 ENCOUNTER — Emergency Department: Payer: Medicare Other

## 2021-01-07 ENCOUNTER — Inpatient Hospital Stay
Admission: EM | Admit: 2021-01-07 | Discharge: 2021-01-12 | DRG: 947 | Disposition: A | Discharge status: Skilled Nursing Facility | Payer: Medicare Other | Attending: Internal Medicine | Admitting: Internal Medicine

## 2021-01-07 ENCOUNTER — Other Ambulatory Visit: Payer: Self-pay

## 2021-01-07 ENCOUNTER — Encounter: Payer: Self-pay | Admitting: Emergency Medicine

## 2021-01-07 DIAGNOSIS — F17210 Nicotine dependence, cigarettes, uncomplicated: Secondary | ICD-10-CM | POA: Diagnosis present

## 2021-01-07 DIAGNOSIS — F32A Depression, unspecified: Secondary | ICD-10-CM | POA: Diagnosis present

## 2021-01-07 DIAGNOSIS — G5600 Carpal tunnel syndrome, unspecified upper limb: Secondary | ICD-10-CM | POA: Diagnosis present

## 2021-01-07 DIAGNOSIS — E782 Mixed hyperlipidemia: Secondary | ICD-10-CM

## 2021-01-07 DIAGNOSIS — Z8673 Personal history of transient ischemic attack (TIA), and cerebral infarction without residual deficits: Secondary | ICD-10-CM | POA: Diagnosis not present

## 2021-01-07 DIAGNOSIS — R4781 Slurred speech: Secondary | ICD-10-CM | POA: Diagnosis present

## 2021-01-07 DIAGNOSIS — I10 Essential (primary) hypertension: Secondary | ICD-10-CM | POA: Diagnosis present

## 2021-01-07 DIAGNOSIS — K2270 Barrett's esophagus without dysplasia: Secondary | ICD-10-CM | POA: Diagnosis present

## 2021-01-07 DIAGNOSIS — M1990 Unspecified osteoarthritis, unspecified site: Secondary | ICD-10-CM | POA: Diagnosis present

## 2021-01-07 DIAGNOSIS — K219 Gastro-esophageal reflux disease without esophagitis: Secondary | ICD-10-CM | POA: Diagnosis present

## 2021-01-07 DIAGNOSIS — R600 Localized edema: Secondary | ICD-10-CM

## 2021-01-07 DIAGNOSIS — Z952 Presence of prosthetic heart valve: Secondary | ICD-10-CM | POA: Diagnosis not present

## 2021-01-07 DIAGNOSIS — G629 Polyneuropathy, unspecified: Secondary | ICD-10-CM | POA: Diagnosis present

## 2021-01-07 DIAGNOSIS — Z7902 Long term (current) use of antithrombotics/antiplatelets: Secondary | ICD-10-CM

## 2021-01-07 DIAGNOSIS — Z7982 Long term (current) use of aspirin: Secondary | ICD-10-CM

## 2021-01-07 DIAGNOSIS — R29810 Facial weakness: Secondary | ICD-10-CM | POA: Diagnosis present

## 2021-01-07 DIAGNOSIS — M25552 Pain in left hip: Secondary | ICD-10-CM

## 2021-01-07 DIAGNOSIS — K589 Irritable bowel syndrome without diarrhea: Secondary | ICD-10-CM | POA: Diagnosis present

## 2021-01-07 DIAGNOSIS — F419 Anxiety disorder, unspecified: Secondary | ICD-10-CM | POA: Diagnosis present

## 2021-01-07 DIAGNOSIS — I2510 Atherosclerotic heart disease of native coronary artery without angina pectoris: Secondary | ICD-10-CM | POA: Diagnosis present

## 2021-01-07 DIAGNOSIS — H47619 Cortical blindness, unspecified side of brain: Secondary | ICD-10-CM | POA: Diagnosis present

## 2021-01-07 DIAGNOSIS — Z9049 Acquired absence of other specified parts of digestive tract: Secondary | ICD-10-CM

## 2021-01-07 DIAGNOSIS — G459 Transient cerebral ischemic attack, unspecified: Secondary | ICD-10-CM | POA: Diagnosis not present

## 2021-01-07 DIAGNOSIS — R531 Weakness: Principal | ICD-10-CM

## 2021-01-07 DIAGNOSIS — R296 Repeated falls: Secondary | ICD-10-CM | POA: Diagnosis present

## 2021-01-07 DIAGNOSIS — Z720 Tobacco use: Secondary | ICD-10-CM | POA: Diagnosis present

## 2021-01-07 DIAGNOSIS — R0689 Other abnormalities of breathing: Secondary | ICD-10-CM

## 2021-01-07 DIAGNOSIS — Z20822 Contact with and (suspected) exposure to covid-19: Secondary | ICD-10-CM | POA: Diagnosis present

## 2021-01-07 DIAGNOSIS — G9340 Encephalopathy, unspecified: Secondary | ICD-10-CM | POA: Diagnosis present

## 2021-01-07 DIAGNOSIS — J9601 Acute respiratory failure with hypoxia: Secondary | ICD-10-CM | POA: Diagnosis present

## 2021-01-07 DIAGNOSIS — G8929 Other chronic pain: Secondary | ICD-10-CM | POA: Diagnosis present

## 2021-01-07 DIAGNOSIS — I252 Old myocardial infarction: Secondary | ICD-10-CM

## 2021-01-07 DIAGNOSIS — W06XXXA Fall from bed, initial encounter: Secondary | ICD-10-CM | POA: Diagnosis present

## 2021-01-07 DIAGNOSIS — N289 Disorder of kidney and ureter, unspecified: Secondary | ICD-10-CM | POA: Diagnosis present

## 2021-01-07 DIAGNOSIS — N3000 Acute cystitis without hematuria: Secondary | ICD-10-CM | POA: Diagnosis present

## 2021-01-07 DIAGNOSIS — M7989 Other specified soft tissue disorders: Secondary | ICD-10-CM

## 2021-01-07 DIAGNOSIS — Z28310 Unvaccinated for covid-19: Secondary | ICD-10-CM

## 2021-01-07 DIAGNOSIS — G479 Sleep disorder, unspecified: Secondary | ICD-10-CM | POA: Diagnosis present

## 2021-01-07 DIAGNOSIS — M25569 Pain in unspecified knee: Secondary | ICD-10-CM | POA: Diagnosis present

## 2021-01-07 DIAGNOSIS — J9602 Acute respiratory failure with hypercapnia: Secondary | ICD-10-CM | POA: Diagnosis present

## 2021-01-07 DIAGNOSIS — W19XXXA Unspecified fall, initial encounter: Secondary | ICD-10-CM

## 2021-01-07 DIAGNOSIS — R569 Unspecified convulsions: Secondary | ICD-10-CM | POA: Diagnosis present

## 2021-01-07 DIAGNOSIS — R251 Tremor, unspecified: Secondary | ICD-10-CM | POA: Diagnosis not present

## 2021-01-07 DIAGNOSIS — E872 Acidosis: Secondary | ICD-10-CM | POA: Diagnosis present

## 2021-01-07 DIAGNOSIS — I8390 Asymptomatic varicose veins of unspecified lower extremity: Secondary | ICD-10-CM | POA: Diagnosis present

## 2021-01-07 DIAGNOSIS — M25519 Pain in unspecified shoulder: Secondary | ICD-10-CM

## 2021-01-07 DIAGNOSIS — E538 Deficiency of other specified B group vitamins: Secondary | ICD-10-CM | POA: Diagnosis present

## 2021-01-07 DIAGNOSIS — Z955 Presence of coronary angioplasty implant and graft: Secondary | ICD-10-CM

## 2021-01-07 DIAGNOSIS — I739 Peripheral vascular disease, unspecified: Secondary | ICD-10-CM | POA: Diagnosis present

## 2021-01-07 DIAGNOSIS — Z79899 Other long term (current) drug therapy: Secondary | ICD-10-CM

## 2021-01-07 DIAGNOSIS — Z993 Dependence on wheelchair: Secondary | ICD-10-CM

## 2021-01-07 DIAGNOSIS — Z90710 Acquired absence of both cervix and uterus: Secondary | ICD-10-CM

## 2021-01-07 DIAGNOSIS — M549 Dorsalgia, unspecified: Secondary | ICD-10-CM | POA: Diagnosis present

## 2021-01-07 DIAGNOSIS — W19XXXD Unspecified fall, subsequent encounter: Secondary | ICD-10-CM | POA: Diagnosis not present

## 2021-01-07 DIAGNOSIS — E785 Hyperlipidemia, unspecified: Secondary | ICD-10-CM | POA: Diagnosis present

## 2021-01-07 LAB — COMPREHENSIVE METABOLIC PANEL
ALT: 17 U/L (ref 0–44)
AST: 24 U/L (ref 15–41)
Albumin: 4.6 g/dL (ref 3.5–5.0)
Alkaline Phosphatase: 51 U/L (ref 38–126)
Anion gap: 7 (ref 5–15)
BUN: 30 mg/dL — ABNORMAL HIGH (ref 8–23)
CO2: 23 mmol/L (ref 22–32)
Calcium: 9.3 mg/dL (ref 8.9–10.3)
Chloride: 110 mmol/L (ref 98–111)
Creatinine, Ser: 1.03 mg/dL — ABNORMAL HIGH (ref 0.44–1.00)
GFR, Estimated: 58 mL/min — ABNORMAL LOW (ref >60)
Glucose, Bld: 125 mg/dL — ABNORMAL HIGH (ref 70–99)
Potassium: 3.5 mmol/L (ref 3.5–5.1)
Sodium: 140 mmol/L (ref 135–145)
Total Bilirubin: 0.9 mg/dL (ref 0.3–1.2)
Total Protein: 7.7 g/dL (ref 6.5–8.1)

## 2021-01-07 LAB — CBC
HCT: 39.8 % (ref 36.0–46.0)
Hemoglobin: 13.1 g/dL (ref 12.0–15.0)
MCH: 28.2 pg (ref 26.0–34.0)
MCHC: 32.9 g/dL (ref 30.0–36.0)
MCV: 85.6 fL (ref 80.0–100.0)
Platelets: 166 10*3/uL (ref 150–400)
RBC: 4.65 MIL/uL (ref 3.87–5.11)
RDW: 15.2 % (ref 11.5–15.5)
WBC: 6.5 10*3/uL (ref 4.0–10.5)
nRBC: 0 % (ref 0.0–0.2)

## 2021-01-07 LAB — URINALYSIS, COMPLETE (UACMP) WITH MICROSCOPIC
Bilirubin Urine: NEGATIVE
Glucose, UA: NEGATIVE mg/dL
Hgb urine dipstick: NEGATIVE
Ketones, ur: NEGATIVE mg/dL
Nitrite: NEGATIVE
Protein, ur: 30 mg/dL — AB
Specific Gravity, Urine: 1.021 (ref 1.005–1.030)
pH: 5 (ref 5.0–8.0)

## 2021-01-07 LAB — D-DIMER, QUANTITATIVE: D-Dimer, Quant: 0.94 ug/mL-FEU — ABNORMAL HIGH (ref 0.00–0.50)

## 2021-01-07 LAB — SEDIMENTATION RATE: Sed Rate: 8 mm/hr (ref 0–30)

## 2021-01-07 LAB — RESP PANEL BY RT-PCR (FLU A&B, COVID) ARPGX2
Influenza A by PCR: NEGATIVE
Influenza B by PCR: NEGATIVE
SARS Coronavirus 2 by RT PCR: NEGATIVE

## 2021-01-07 LAB — CK: Total CK: 227 U/L (ref 38–234)

## 2021-01-07 LAB — TROPONIN I (HIGH SENSITIVITY)
Troponin I (High Sensitivity): 11 ng/L (ref <18)
Troponin I (High Sensitivity): 11 ng/L (ref <18)

## 2021-01-07 LAB — AMMONIA: Ammonia: 22 umol/L (ref 9–35)

## 2021-01-07 IMAGING — MR MR HEAD WO/W CM
14 series · 46 of 48 positions shown · IV contrast (gadavist)
Comparison: Brain MRI [DATE].

CLINICAL DATA: Mental status change, unknown cause.

EXAM:
MRI HEAD WITHOUT AND WITH CONTRAST
TECHNIQUE: Multiplanar, multiecho pulse sequences of the brain and surrounding
structures were obtained without and with intravenous contrast.
CONTRAST:  7.5mL GADAVIST GADOBUTROL 1 MMOL/ML IV SOLN

[Series 5: ax dwi_tracew · axial · 3.0mm · 0.71mm/px · z∈[-106,+56]mm · 4 of 56 slices shown]
[im 1/56]
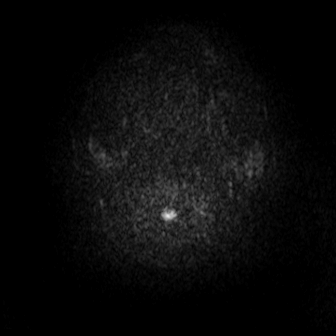
[im 19/56]
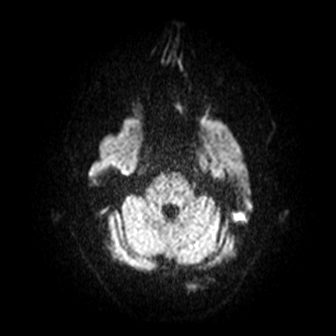
[im 37/56]
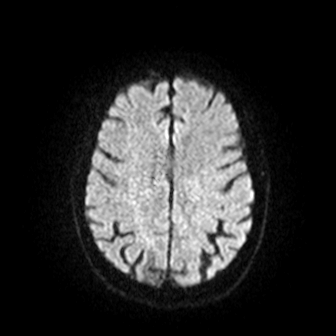
[im 56/56]
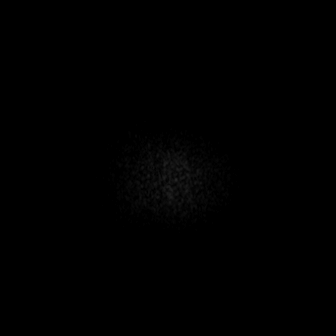

[Series 6: ax dwi_adc · axial · 3.0mm · 0.71mm/px · z∈[-106,+56]mm · 3 of 56 slices shown]
[im 1/56]
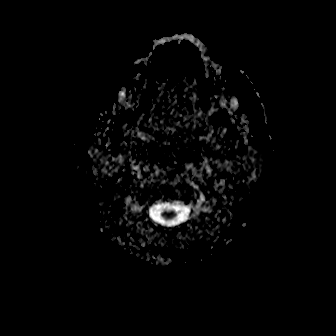
[im 28/56]
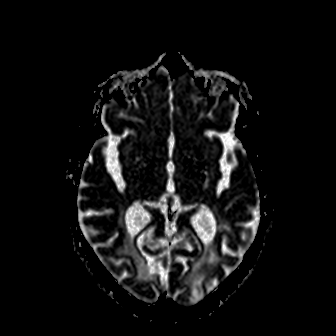
[im 56/56]
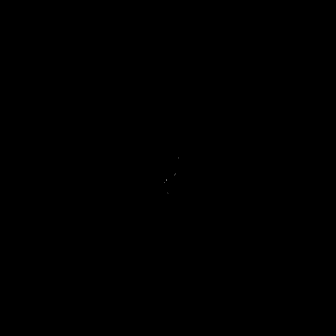

[Series 7: cor dwi_tracew · coronal · 5.0mm · 0.68mm/px · 2 of 40 slices shown]
[im 1/40]
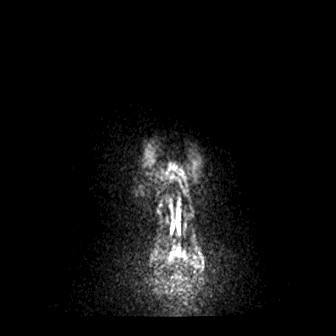
[im 40/40]
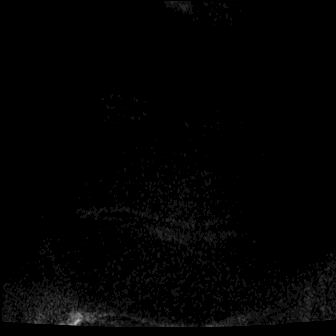

[Series 8: cor dwi_adc · coronal · 5.0mm · 0.68mm/px · 2 of 39 slices shown]
[im 1/39]
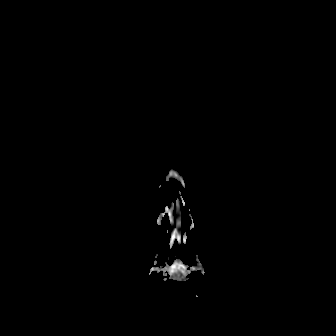
[im 39/39]
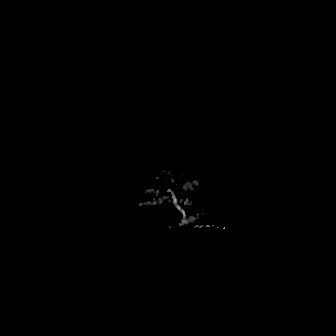

[Series 9: T1 · sagittal · 5.0mm · 0.47mm/px · 1 of 24 slices shown (1 of 2)]
[im 1/24]
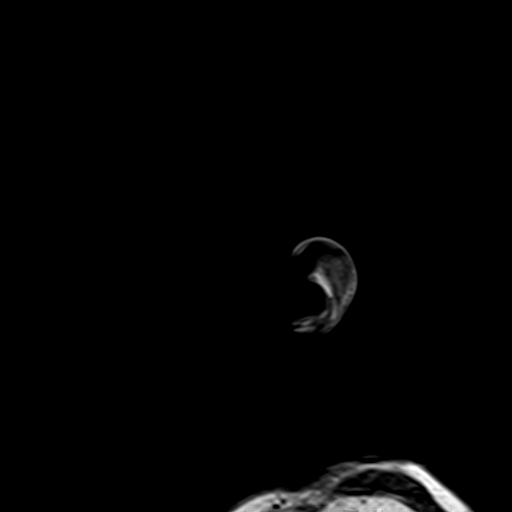

[Series 10: T2 · axial · 5.0mm · 0.86mm/px · z∈[-89,+52]mm · 2 of 25 slices shown (1 of 2)]
[im 1/25]
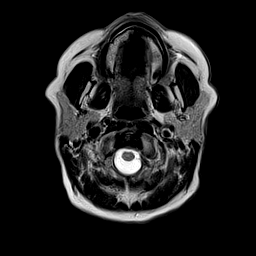
[im 25/25]
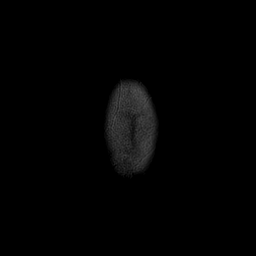

[Series 12: pha_images · axial · 3.0mm · 0.90mm/px · z∈[-95,+55]mm · 3 of 51 slices shown]
[im 1/51]
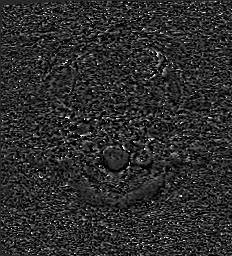
[im 26/51]
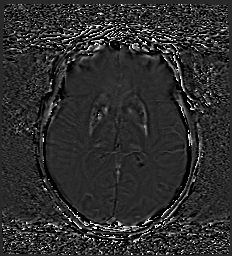
[im 51/51]
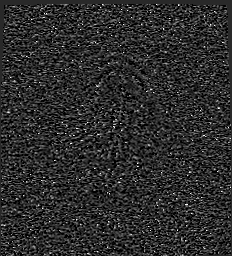

[Series 13: swi_images · axial · 3.0mm · 0.90mm/px · z∈[-95,+55]mm · 3 of 52 slices shown]
[im 1/52]
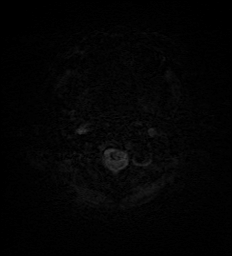
[im 26/52]
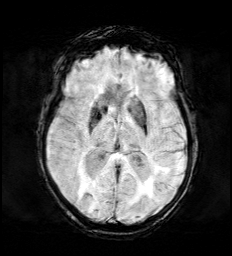
[im 52/52]
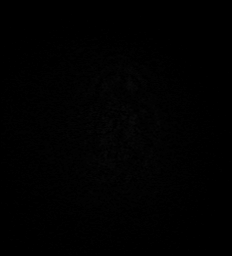

[Series 15: FLAIR · axial · 3.0mm · 0.69mm/px · z∈[-91,+53]mm · 3 of 50 slices shown]
[im 1/50]
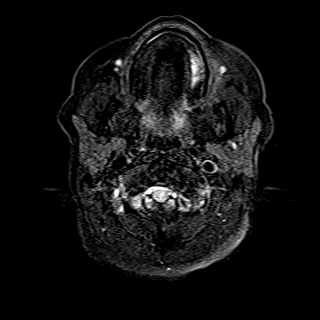
[im 25/50]
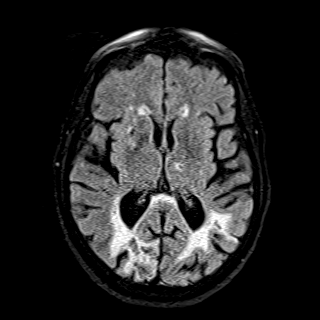
[im 50/50]
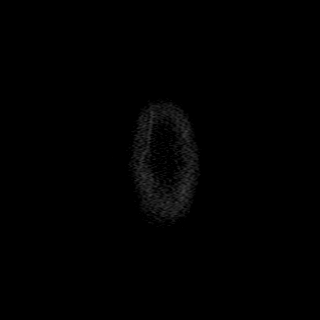

[Series 16: T1 · axial · 1.0mm · 0.98mm/px · z∈[-99,+57]mm · 8 of 160 slices shown (2 of 2)]
[im 1/160]
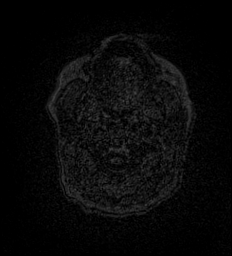
[im 18/160]
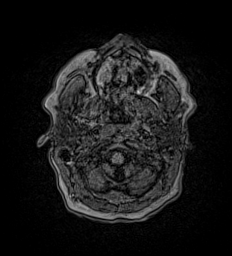
[im 54/160]
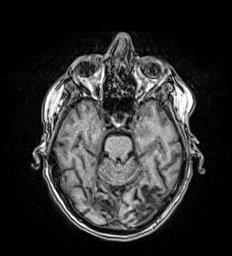
[im 71/160]
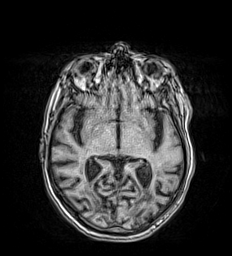
[im 89/160]
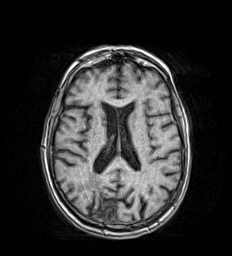
[im 107/160]
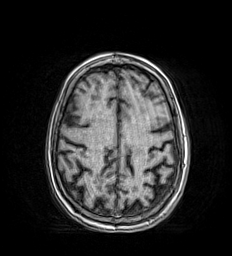
[im 142/160]
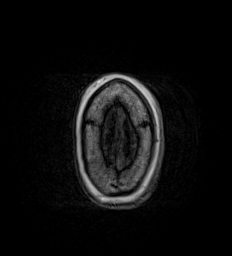
[im 160/160]
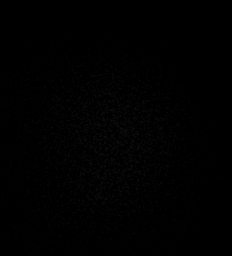

[Series 17: T2 · coronal · 5.0mm · 0.45mm/px · 2 of 30 slices shown (2 of 2)]
[im 1/30]
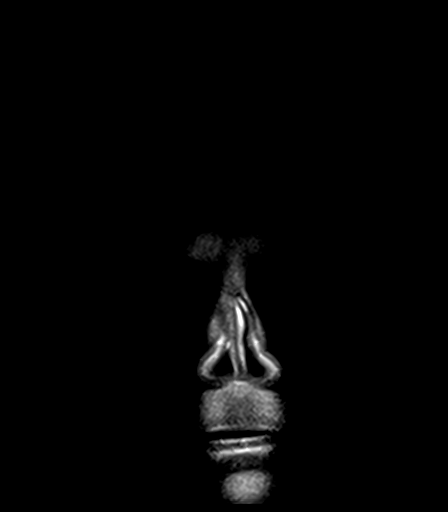
[im 30/30]
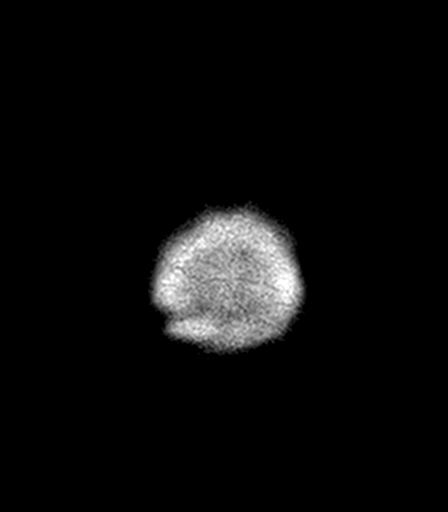

[Series 18: T1 post-contrast · axial · 1.0mm · 0.98mm/px · z∈[-99,+57]mm · 10 of 160 slices shown (1 of 3)]
[im 1/160]
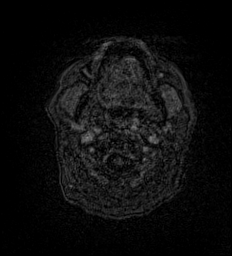
[im 18/160]
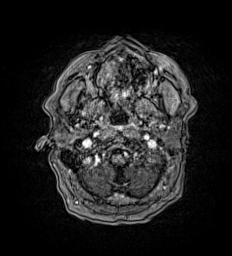
[im 36/160]
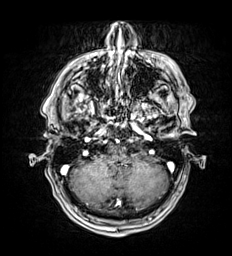
[im 54/160]
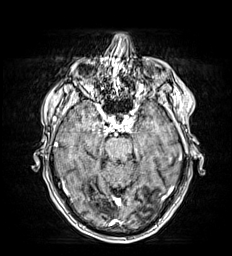
[im 71/160]
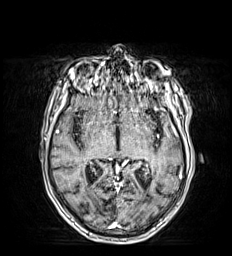
[im 89/160]
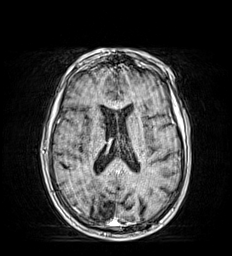
[im 107/160]
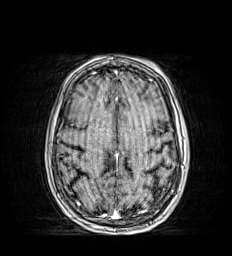
[im 124/160]
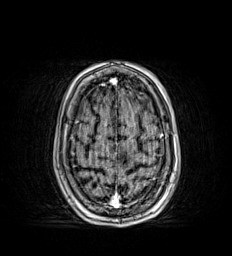
[im 142/160]
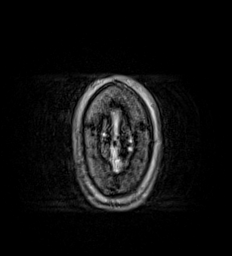
[im 160/160]
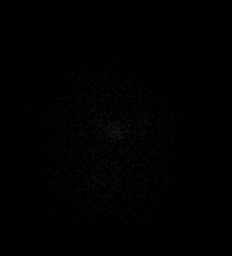

[Series 19: T1 post-contrast · coronal · 5.0mm · 0.43mm/px · 2 of 30 slices shown (2 of 3)]
[im 1/30]
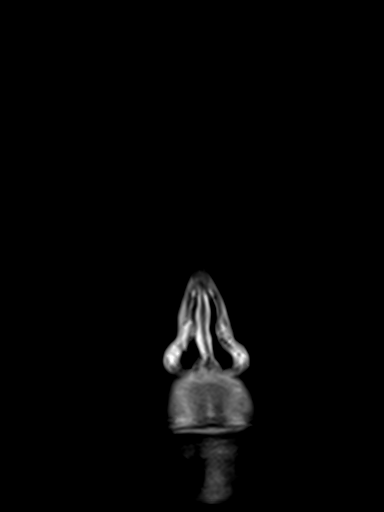
[im 30/30]
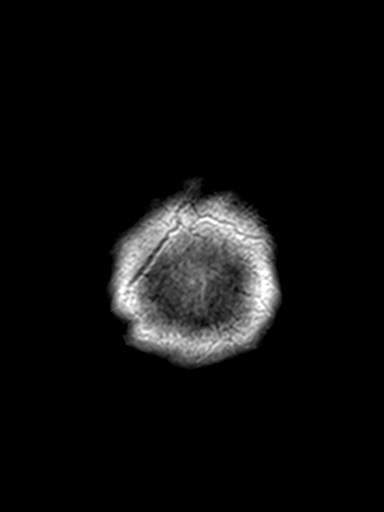

[Series 20: T1 post-contrast · sagittal · 5.0mm · 0.47mm/px · 1 of 24 slices shown (3 of 3)]
[im 1/24]
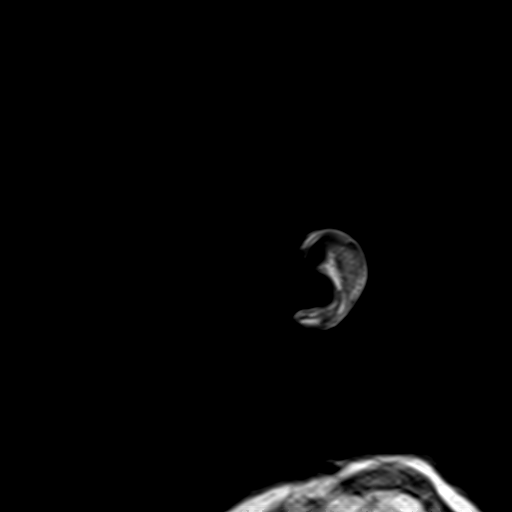

[46 of 48 positions shown; findings below may reference images not displayed]

FINDINGS: Brain:

Intermittently motion degraded examination, limiting evaluation.
Most notably, there is moderate/severe motion degradation of the
axial T1 weighted postcontrast sequence, coronal T1 weighted
postcontrast sequence and sagittal T1 weighted postcontrast
sequence.

Mild generalized cerebral and cerebellar atrophy.

Redemonstrated chronic cortical/subcortical infarcts within the
bilateral frontal lobes, bilateral parietal lobes, bilateral
occipital lobes and posterior left temporal lobe.

Subcentimeter focus of diffusion weighted hyperintensity within the
left occipital infarction territory, which is favored related to
chronic hemosiderin deposition at this site rather than superimposed
acute infarct.

No evidence of acute infarct elsewhere.

Redemonstrated chronic lacunar infarcts within the bilateral basal
ganglia, thalami and cerebellar hemispheres.

Background moderate cerebral white matter chronic small vessel
ischemic disease.

No evidence of intracranial mass.

No extra-axial fluid collection.

No midline shift.

No abnormal intracranial enhancement.

Vascular: Expected proximal arterial flow voids.

Skull and upper cervical spine: No focal marrow lesion.

Sinuses/Orbits: Visualized orbits show no acute finding. No
significant paranasal sinus disease.
IMPRESSION: Motion degraded examination, as described and limiting evaluation.

Redemonstrated chronic cortical/subcortical infarcts within the
bilateral frontal, parietal, occipital and left temporal lobes.

A subcentimeter focus of diffusion weighted hyperintensity within
the left occipital lobe infarction territory is favored secondary to
chronic hemosiderin deposition or cortical laminar necrosis at this
site, rather than superimposed acute infarct.

Redemonstrated chronic lacunar infarcts within the bilateral basal
ganglia, thalami and cerebellar hemispheres.

Background moderate cerebral white matter chronic small vessel
ischemic disease, and mild generalized parenchymal atrophy.

## 2021-01-07 IMAGING — US US EXTREM LOW VENOUS
1 series · 14 of 24 positions shown · non-contrast
Comparison: None.

CLINICAL DATA: Lower extremity swelling.

EXAM:
BILATERAL LOWER EXTREMITY VENOUS DOPPLER ULTRASOUND
TECHNIQUE: Gray-scale sonography with compression, as well as color and duplex
ultrasound, were performed to evaluate the deep venous system(s)
from the level of the common femoral vein through the popliteal and
proximal calf veins.

[Series 1: us venous img lower bilat (dvt) · portal-venous · 14 of 49 slices shown]
[im 1/49]
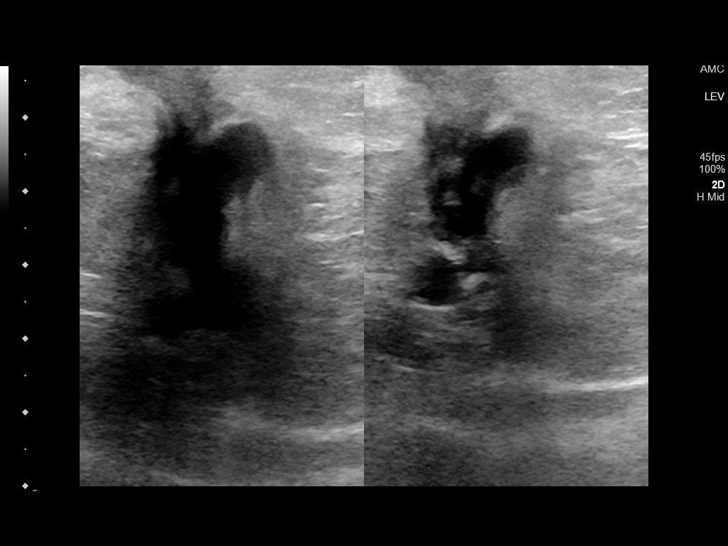
[im 5/49]
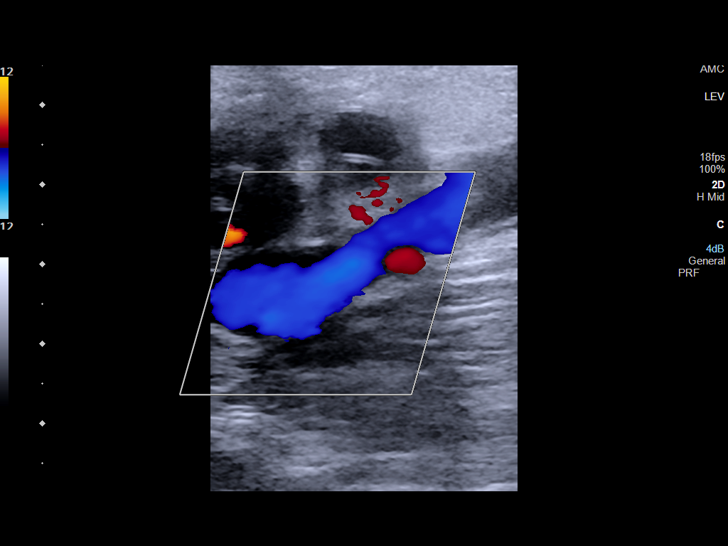
[im 9/49]
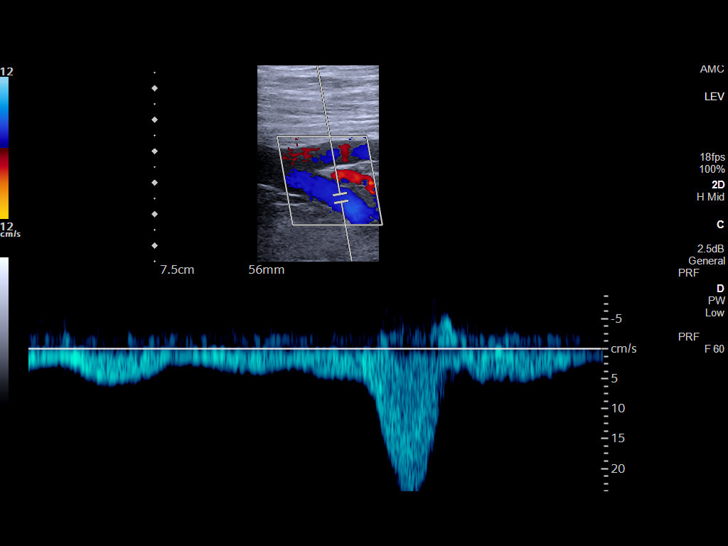
[im 13/49]
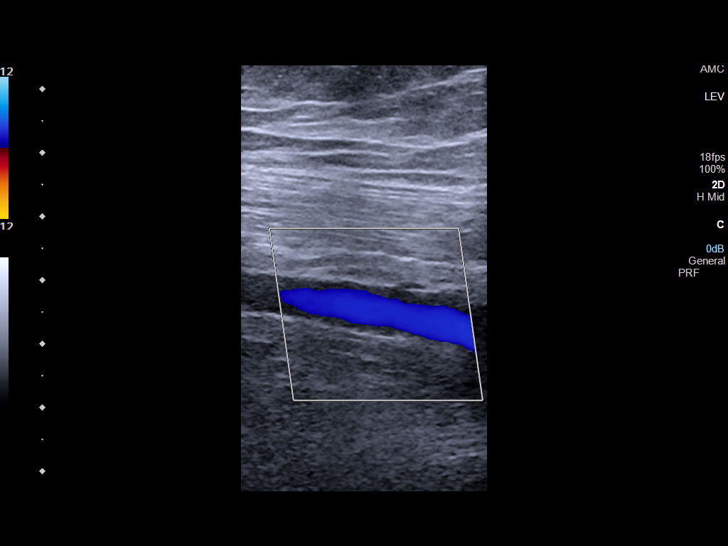
[im 15/49]
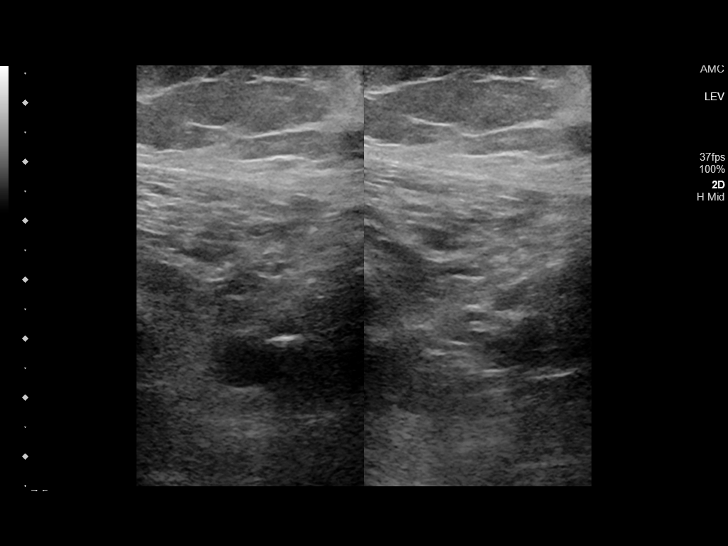
[im 19/49]
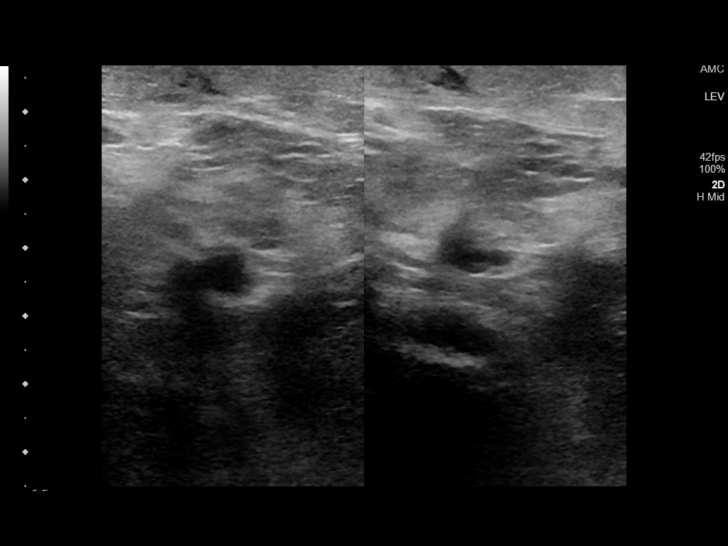
[im 23/49]
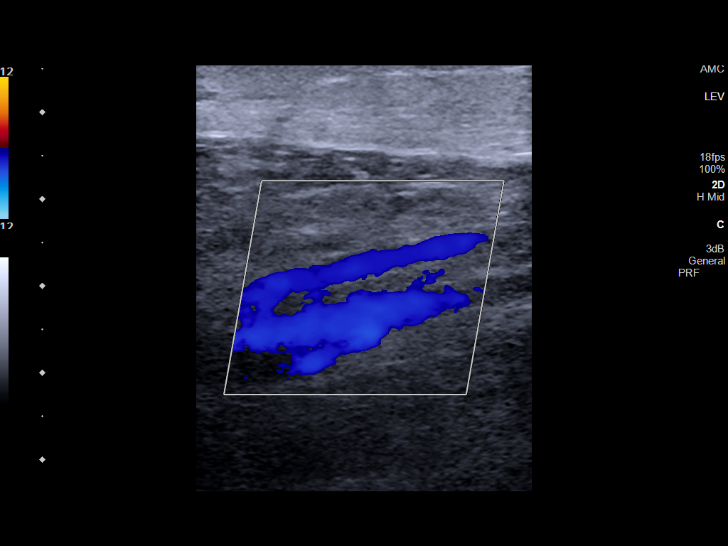
[im 26/49]
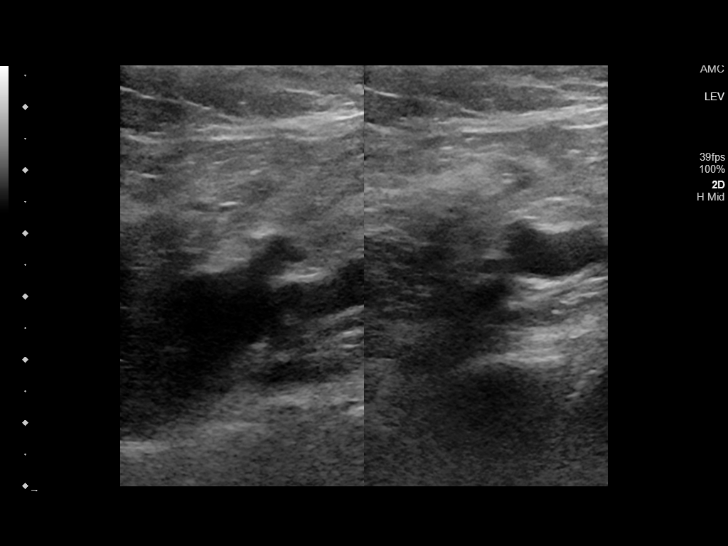
[im 30/49]
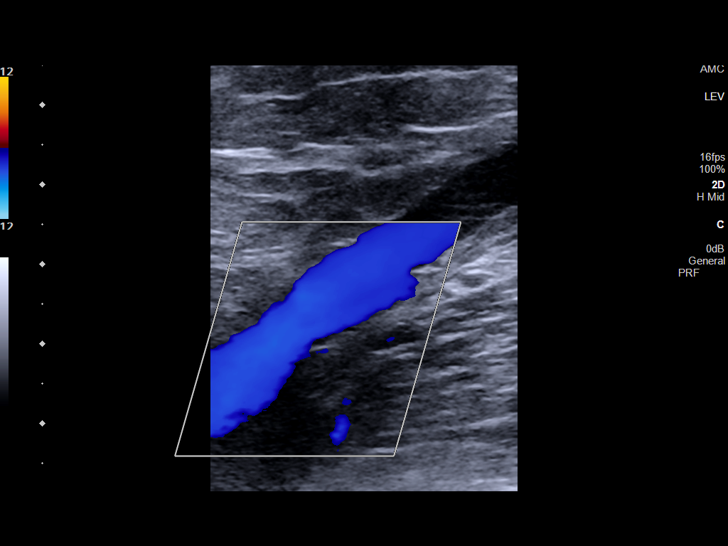
[im 34/49]
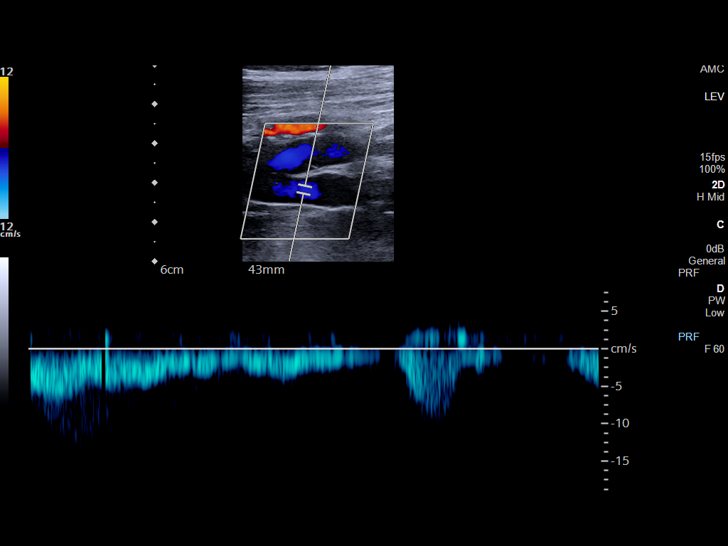
[im 38/49]
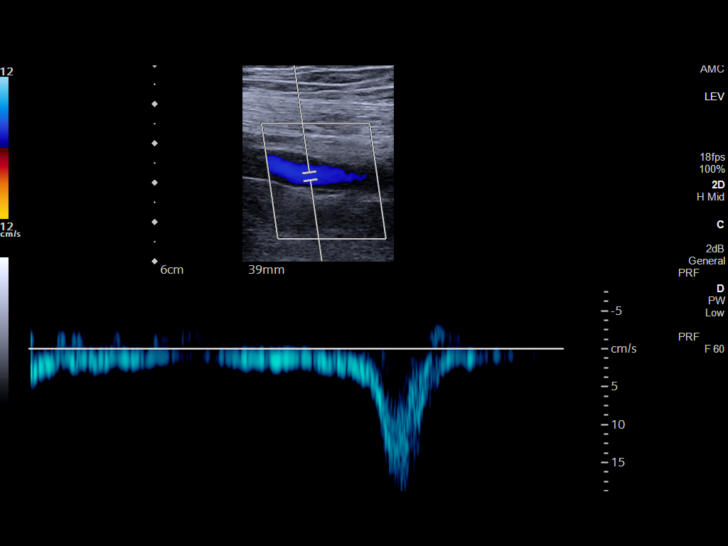
[im 40/49]
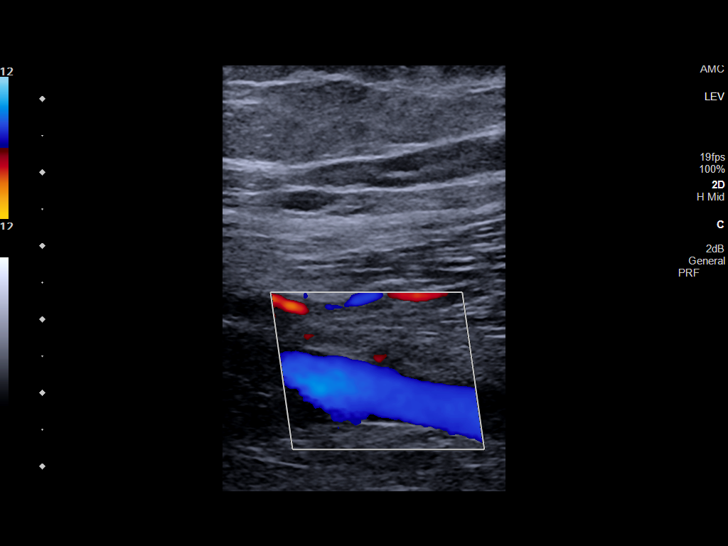
[im 44/49]
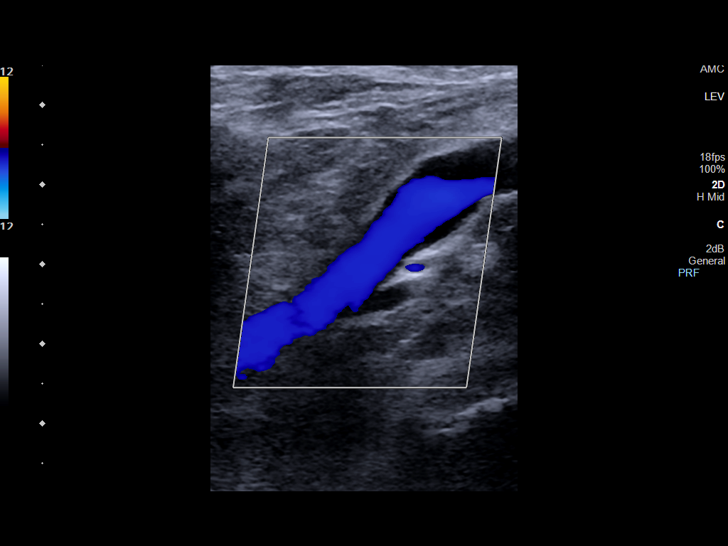
[im 49/49]
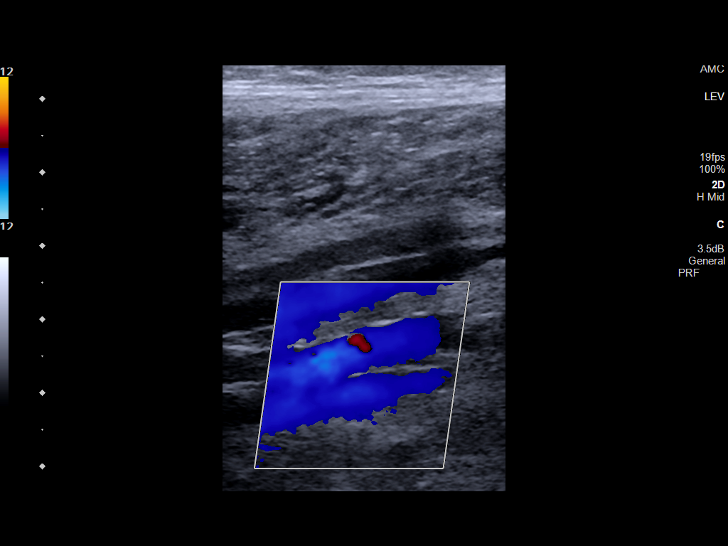

[14 of 24 positions shown; findings below may reference images not displayed]

FINDINGS: VENOUS

Normal compressibility of the common femoral, superficial femoral,
and popliteal veins, as well as the visualized calf veins.
Visualized portions of profunda femoral vein and great saphenous
vein unremarkable. No filling defects to suggest DVT on grayscale or
color Doppler imaging. Doppler waveforms show normal direction of
venous flow, normal respiratory plasticity and response to
augmentation.

OTHER

None.

Limitations: none
IMPRESSION: No evidence of bilateral lower extremity DVT.

## 2021-01-07 IMAGING — DX DG CHEST 1V PORT
1 series · 1 of 1 positions shown · non-contrast
Comparison: None.

CLINICAL DATA: Altered mental status.  Confusion and weakness

EXAM:
PORTABLE CHEST 1 VIEW

[chest ap]
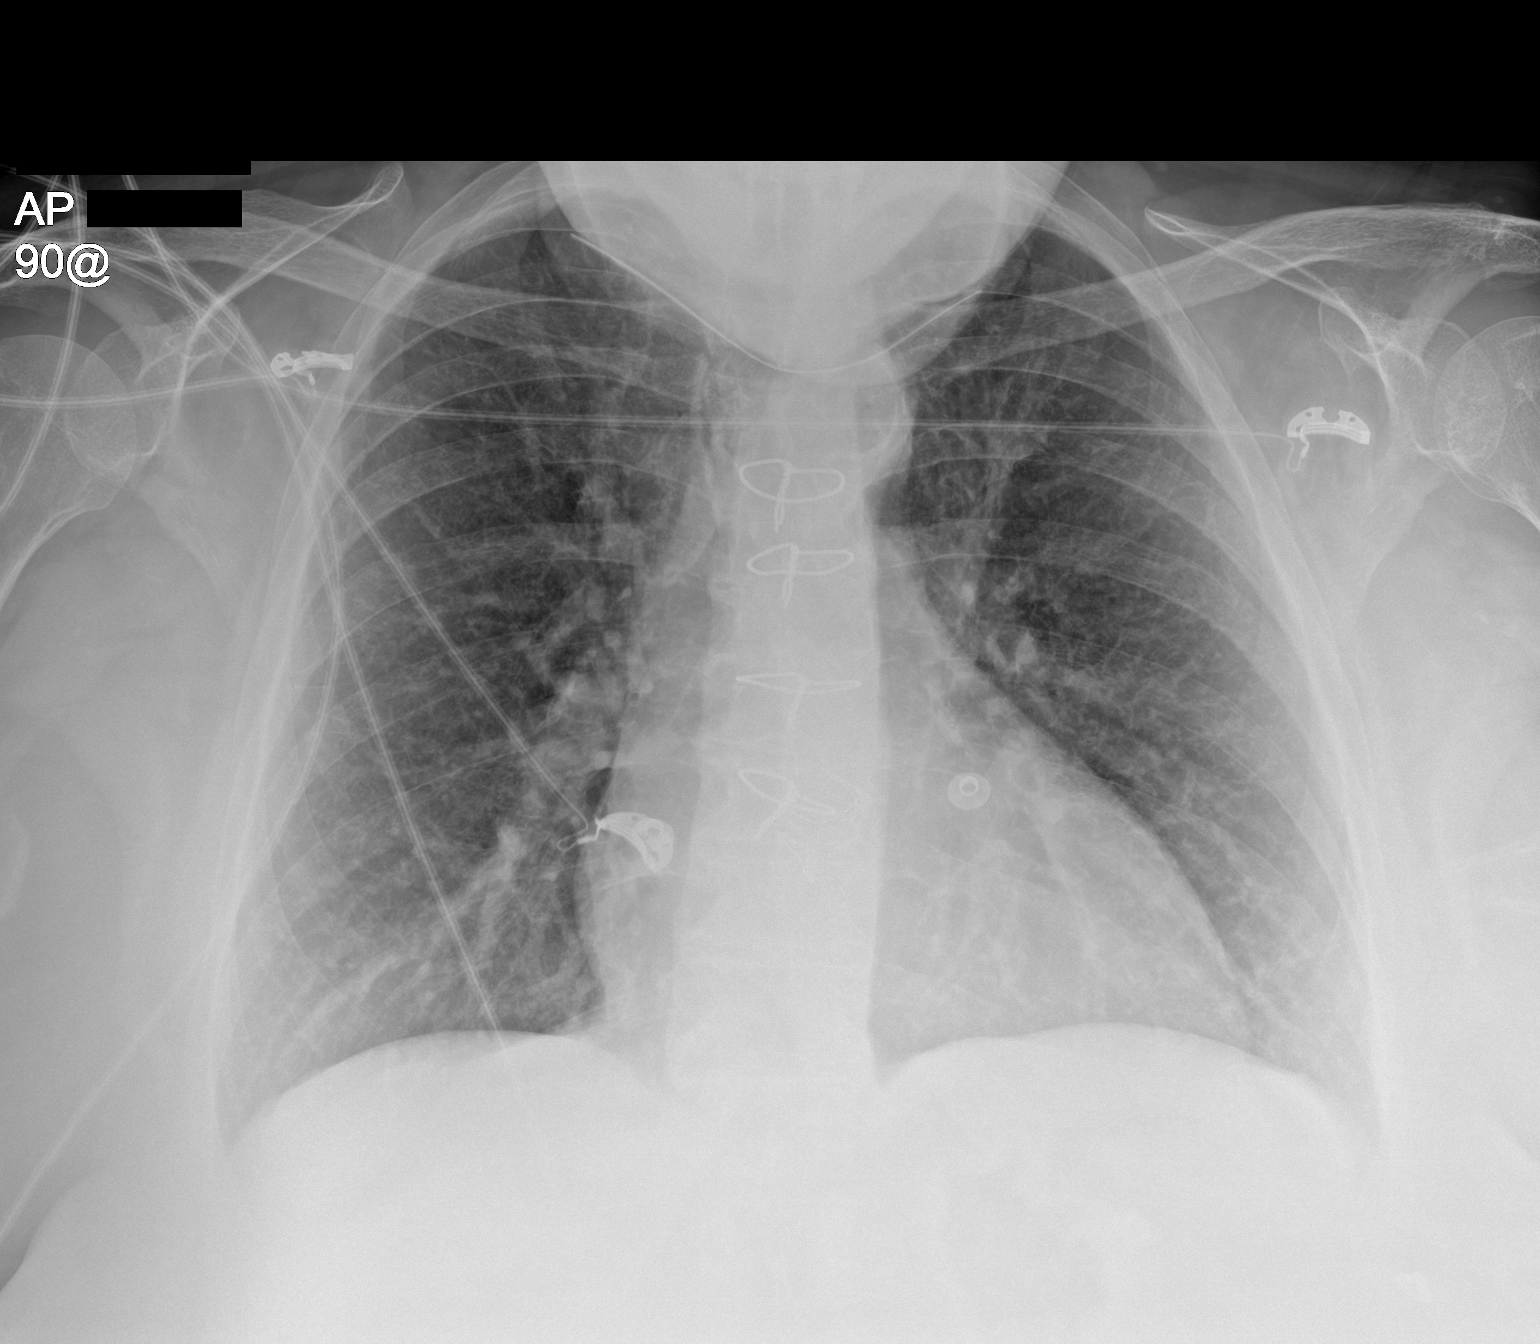

[1 of 1 positions shown; findings below may reference images not displayed]

FINDINGS: The heart size and mediastinal contours are within normal limits.
Both lungs are clear. The visualized skeletal structures are
unremarkable.
IMPRESSION: No active disease.

## 2021-01-07 MED ORDER — CLOPIDOGREL BISULFATE 75 MG PO TABS
75.0000 mg | ORAL_TABLET | Freq: Every day | ORAL | Status: DC
  Administered 2021-01-08 – 2021-01-12 (×5): 75 mg via ORAL
  Filled 2021-01-07 (×5): qty 1

## 2021-01-07 MED ORDER — NORTRIPTYLINE HCL 25 MG PO CAPS
50.0000 mg | ORAL_CAPSULE | Freq: Every day | ORAL | Status: DC
  Administered 2021-01-08 – 2021-01-11 (×4): 50 mg via ORAL
  Filled 2021-01-07 (×7): qty 2

## 2021-01-07 MED ORDER — DONEPEZIL HCL 5 MG PO TABS
5.0000 mg | ORAL_TABLET | Freq: Every day | ORAL | Status: DC
  Administered 2021-01-08 – 2021-01-12 (×5): 5 mg via ORAL
  Filled 2021-01-07 (×5): qty 1

## 2021-01-07 MED ORDER — ACETAMINOPHEN 325 MG PO TABS
650.0000 mg | ORAL_TABLET | Freq: Four times a day (QID) | ORAL | Status: AC | PRN
  Administered 2021-01-07: 650 mg via ORAL
  Filled 2021-01-07: qty 2

## 2021-01-07 MED ORDER — LISINOPRIL 20 MG PO TABS
40.0000 mg | ORAL_TABLET | Freq: Every day | ORAL | Status: DC
  Administered 2021-01-08 – 2021-01-12 (×5): 40 mg via ORAL
  Filled 2021-01-07 (×5): qty 2

## 2021-01-07 MED ORDER — NITROGLYCERIN 0.4 MG SL SUBL: 0.4000 mg | SUBLINGUAL_TABLET | SUBLINGUAL | Status: DC | PRN

## 2021-01-07 MED ORDER — GADOBUTROL 1 MMOL/ML IV SOLN
7.5000 mL | Freq: Once | INTRAVENOUS | Status: AC | PRN
  Administered 2021-01-07: 7.5 mL via INTRAVENOUS

## 2021-01-07 MED ORDER — NICOTINE 21 MG/24HR TD PT24
21.0000 mg | MEDICATED_PATCH | Freq: Every day | TRANSDERMAL | Status: DC
  Administered 2021-01-08 – 2021-01-12 (×5): 21 mg via TRANSDERMAL
  Filled 2021-01-07 (×5): qty 1

## 2021-01-07 MED ORDER — CLONAZEPAM 0.25 MG PO TBDP
0.2500 mg | ORAL_TABLET | Freq: Every day | ORAL | Status: DC
  Administered 2021-01-08 – 2021-01-12 (×5): 0.25 mg via ORAL
  Filled 2021-01-07 (×5): qty 1

## 2021-01-07 MED ORDER — ROSUVASTATIN CALCIUM 20 MG PO TABS
40.0000 mg | ORAL_TABLET | Freq: Every day | ORAL | Status: DC
  Administered 2021-01-08 – 2021-01-11 (×4): 40 mg via ORAL
  Filled 2021-01-07: qty 4
  Filled 2021-01-07 (×4): qty 2

## 2021-01-07 MED ORDER — ACETAMINOPHEN 650 MG RE SUPP: 650.0000 mg | Freq: Four times a day (QID) | RECTAL | Status: AC | PRN

## 2021-01-07 MED ORDER — ASPIRIN EC 81 MG PO TBEC
81.0000 mg | DELAYED_RELEASE_TABLET | Freq: Every day | ORAL | Status: DC
  Administered 2021-01-08 – 2021-01-12 (×5): 81 mg via ORAL
  Filled 2021-01-07 (×5): qty 1

## 2021-01-07 MED ORDER — LAMOTRIGINE 100 MG PO TABS
50.0000 mg | ORAL_TABLET | Freq: Two times a day (BID) | ORAL | Status: DC
  Administered 2021-01-08 – 2021-01-12 (×10): 50 mg via ORAL
  Filled 2021-01-07: qty 2
  Filled 2021-01-07 (×10): qty 1

## 2021-01-07 MED ORDER — BUPROPION HCL ER (SR) 150 MG PO TB12
150.0000 mg | ORAL_TABLET | Freq: Two times a day (BID) | ORAL | Status: DC
  Administered 2021-01-08: 150 mg via ORAL
  Filled 2021-01-07 (×3): qty 1

## 2021-01-07 MED ORDER — NAPROXEN 375 MG PO TABS
375.0000 mg | ORAL_TABLET | Freq: Two times a day (BID) | ORAL | Status: AC | PRN
Start: 2021-01-07 — End: 2021-01-11
  Administered 2021-01-11 (×2): 375 mg via ORAL
  Filled 2021-01-07 (×4): qty 1

## 2021-01-07 MED ORDER — CITALOPRAM HYDROBROMIDE 20 MG PO TABS
40.0000 mg | ORAL_TABLET | Freq: Every day | ORAL | Status: DC
  Administered 2021-01-08 – 2021-01-12 (×5): 40 mg via ORAL
  Filled 2021-01-07 (×5): qty 2

## 2021-01-07 MED ORDER — ONDANSETRON HCL 4 MG/2ML IJ SOLN: 4.0000 mg | Freq: Four times a day (QID) | INTRAMUSCULAR | Status: DC | PRN

## 2021-01-07 MED ORDER — ONDANSETRON HCL 4 MG PO TABS
4.0000 mg | ORAL_TABLET | Freq: Four times a day (QID) | ORAL | Status: DC | PRN
Start: 2021-01-07 — End: 2021-01-12

## 2021-01-07 MED ORDER — MONTELUKAST SODIUM 10 MG PO TABS
10.0000 mg | ORAL_TABLET | Freq: Every evening | ORAL | Status: DC
  Administered 2021-01-08 – 2021-01-11 (×4): 10 mg via ORAL
  Filled 2021-01-07 (×4): qty 1

## 2021-01-07 MED ORDER — ENOXAPARIN SODIUM 40 MG/0.4ML IJ SOSY
40.0000 mg | PREFILLED_SYRINGE | INTRAMUSCULAR | Status: DC
  Administered 2021-01-07 – 2021-01-11 (×5): 40 mg via SUBCUTANEOUS
  Filled 2021-01-07 (×5): qty 0.4

## 2021-01-07 MED ORDER — FLUTICASONE PROPIONATE 50 MCG/ACT NA SUSP
1.0000 | Freq: Every day | NASAL | Status: DC | PRN
  Filled 2021-01-07: qty 16

## 2021-01-07 MED ORDER — AMLODIPINE BESYLATE 10 MG PO TABS
10.0000 mg | ORAL_TABLET | Freq: Every day | ORAL | Status: DC
  Administered 2021-01-08 – 2021-01-12 (×5): 10 mg via ORAL
  Filled 2021-01-07 (×5): qty 1

## 2021-01-07 NOTE — ED Notes (Signed)
[facility]RT notified to set up bipap.

## 2021-01-07 NOTE — ED Triage Notes (Signed)
Pt brought in by [facility] with complaints of altered mental status changes. Daughter is reporting she is not herself. Pt is alert and oriented times 4. She states that she has history of UTIs and has been having trouble with them again, she reports she has an appointment with a urologist. She does have an odor of urine

## 2021-01-07 NOTE — H&P (Addendum)
History and Physical   Tara [name] TIW:[id number] DOB: Jan 19, 1946 DOA: 01/07/2021  PCP: Dorcas Carrow, DO  Outpatient Specialists: Dr. Malvin Johns, neurology Patient coming from: home  I have personally briefly reviewed patient's old medical records in [facility] EMR.  Chief Concern: bilateral lower extremity weakness  HPI: Tara [name] is a 75 y.o. 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  She reports sudden onset of bilateral leg weakness and feeling like her legs are lead heavy.  These symptoms started at approximately 3 AM on day of presentation.  She was not able to return to sleep after this and did not have any p.o. intake after this.  She also experienced difficulty ambulating at this time.  She denies chest pain, shortness of breath, nausea, vomiting, abdominal pain, dysuria, hematuria  She denies changes to her current medications at this time.  Social history: lives at home with husband. She smokes 1/3 pack per day. She denies etoh and recreational drug use. She is retired and formerly worked for the [employer].  Vaccinations: she is not vaccinated for covid 19.   ROS: Constitutional: no weight change, no fever ENT/Mouth: no sore throat, no rhinorrhea Eyes: no eye pain, no vision changes Cardiovascular: no chest pain, no dyspnea,  no edema, no palpitations Respiratory: no cough, no sputum, no wheezing Gastrointestinal: no nausea, no vomiting, no diarrhea, no constipation Genitourinary: no urinary incontinence, no dysuria, no hematuria Musculoskeletal: no arthralgias, no myalgias Skin: no skin lesions,  no pruritus, Neuro: + weakness, no loss of consciousness, no syncope Psych: no anxiety, no depression, no decrease appetite Heme/Lymph: no bruising, no bleeding  ED Course: Discussed with ED provider, patient requiring hospitalization for bilateral lower extremity weakness.  Vitals in the emergency department show temperature of 97.9, respiration rate of 21, blood pressure 157/71, SPO2 of 94% on 2 L nasal cannula.  ED VBG performed showed 7.31/79/30 9.8  CK was within normal limits, sed rate was within normal limits  Assessment/Plan  Active Problems:   Anxiety   HTN (hypertension)   Hyperlipidemia   IBS (irritable bowel syndrome)   History of aortic valve replacement   B12 deficiency   GERD (gastroesophageal reflux disease)   PAD (peripheral artery disease) (HCC)   Difficulty sleeping   History of stroke   Tobacco abuse   TIA (transient ischemic attack)   Weakness   Acute respiratory failure with hypoxia and hypercarbia (HCC)   # Bilateral lower extremity weakness - MRI of the brain with and without contrast was ordered by EDP and read as redemonstration of chronic cortical/subcortical infarcts within the bilateral frontal, parietal, occipital, left temporal lobes.  Subcentimeter focus of diffusion weighted hyperintensity within the left occipital brain infarction territories.  Secondary to chronic hemosiderin deposition or cortical laminar necrosis at the site, rather than superimposed acute infarct. - Chronic lacunar infarcts in bilateral basal ganglia, thalami, cerebral hemisphere. - Background moderate cerebral white matter chronic small vessel ischemic disease.. - Checking B12, B1, TSH - EDP ordered a NIF, every 12 hours S4 respiratory muscles - If the above work-up is negative would recommend a.m. team to consider consult to neurology - Bilateral lower extremity ultrasound to assess for DVT  # Respiratory acidosis with CO2 retention # In setting  of patient's increased  drowsiness and sleepiness - Check ammonia - Concern for respiratory decompensation - BiPAP was ordered, extensive discussion with patient regarding the benefits of BiPAP - She adamantly declines BiPAP  # Patient is status post aortic valve placement in 2015-bovine, resumed aspirin, Plavix  # History of seizure activity-follows with Dr. Malvin JohnsPotter, on 01/01/2021 patient was scheduled for 3-hour EEG in the future - Patient had an EEG on 12/03/2020 which showed within normal limits  # Possible obstructive sleep apnea-CPAP nightly ordered  # Chronic knee and back pain-patient reports she does not currently take Percocets however requested Percocet -Patient declines acetaminophen - Due to concerns for respiratory decompensation, I have ordered naproxen  # Tremors-Lamictal 50 mg twice daily resumed, follow-up with outpatient neurologist  # History of myocardial infarct in 1990s and 2008-Per care everywhere review, patient had stent placement of unknown details on cardiology note in 09/29/2018 - Resumed home aspirin and Plavix for secondary prevention  # Multiple eye surgery including intraocular repositioning in 01/06/2018  # Varicose veins # COVID PCR/influenza A/influenza B were negative  Chart reviewed.   DVT prophylaxis: Enoxaparin 40 mg subcutaneous Code Status: Limited. She wants CPR and no intubation and no BIPAP Diet: N.p.o. except for sips of meds Family Communication: Family was not at bedside Disposition Plan: Pending clinical course Consults called: None at this time Admission status: Progressive cardiac, inpatient, with telemetry  Past Medical History:  Diagnosis Date   Anxiety    Arthritis    Barrett's syndrome    Carpal tunnel syndrome    Depression    GERD (gastroesophageal reflux disease)    Heart murmur    Hyperlipidemia    Hypertension    Myocardial infarction (HCC)    08   PAD (peripheral artery disease) (HCC)    Stroke [facility](HCC)    Past Surgical History:   Procedure Laterality Date   ABDOMINAL AORTOGRAM W/LOWER EXTREMITY N/A 07/28/2019   Procedure: ABDOMINAL AORTOGRAM W/LOWER EXTREMITY;  Surgeon: Iran OuchArida, Muhammad A, MD;  Location: [facility];  Service: Cardiovascular;  Laterality: N/A;   ABDOMINAL HYSTERECTOMY     ARTERY BIOPSY Right 07/12/2019   Procedure: BIOPSY TEMPORAL ARTERY;  Surgeon: Henrene DodgePiscoya, Jose, MD;  Location: [facility] ORS;  Service: General;  Laterality: Right;   BREAST BIOPSY Left 2007   Neg- [place]   CARDIAC CATHETERIZATION     CARDIAC VALVE REPLACEMENT     CHOLECYSTECTOMY     COLON SURGERY     COLONOSCOPY WITH PROPOFOL N/A 04/09/2019   Procedure: COLONOSCOPY WITH PROPOFOL;  Surgeon: Pasty Spillersahiliani, Varnita B, MD;  Location: [facility] ENDOSCOPY;  Service: Endoscopy;  Laterality: N/A;   CORONARY [place] WITH [place]ACEMENT  [place]    DENTAL SURGERY     ESOPHAGOGASTRODUODENOSCOPY (EGD) WITH PROPOFOL N/A 04/09/2019   Procedure: ESOPHAGOGASTRODUODENOSCOPY (EGD) WITH PROPOFOL;  Surgeon: Pasty Spillersahiliani, Varnita B, MD;  Location: [facility] ENDOSCOPY;  Service: Endoscopy;  Laterality: N/A;   EXCISIONAL HEMORRHOIDECTOMY     EYE SURGERY     FEMORAL-POPLITEAL BYPASS GRAFT Right 08/02/2019   Procedure: BYPASS GRAFT FEMORAL-POPLITEAL ARTERY;  Surgeon: Larina EarthlyEarly, Todd F, MD;  [facility];  Service: Vascular;  Laterality: Right;   HERNIA REPAIR     LOWER EXTREMITY ANGIOGRAPHY Left 11/11/2019   Procedure: LOWER EXTREMITY ANGIOGRAPHY;  Surgeon: Annice Needyew, Jason S, MD;  Location: [facility] INVASIVE CV LAB;  Service: Cardiovascular;  Laterality: Left;   MULTIPLE TOOTH EXTRACTIONS     TONSILLECTOMY  Social History:  reports that she has been smoking cigarettes. She has a 0.75 pack-year smoking history. She has never used smokeless tobacco. She reports that she does not drink alcohol and does not use drugs.  No Known Allergies Family History  Adopted: Yes  Problem Relation Age of Onset   Breast cancer Mother 52    Family history: Family history reviewed and not pertinent  Prior to Admission medications   Medication Sig Start Date End Date Taking? Authorizing Provider  amLODipine (NORVASC) 10 MG tablet Take 1 tablet (10 mg total) by mouth daily. 12/11/20   Olevia Perches P, DO  aspirin EC 81 MG tablet Take 1 tablet (81 mg total) by mouth daily. 11/11/19   Annice Needy, MD  B-D TB SYRINGE 1CC/27GX1/2" 27G X 1/2" 1 ML MISC USE AS DIRECTED EVERY 30 DAYS 11/06/19   [provider]  buPROPion (WELLBUTRIN SR) 150 MG 12 hr tablet Take 1 tablet (150 mg total) by mouth 2 (two) times daily. 12/11/20   Johnson, Megan P, DO  citalopram (CELEXA) 40 MG tablet Take 1 tablet (40 mg total) by mouth daily. 12/11/20   Olevia Perches P, DO  clopidogrel (PLAVIX) 75 MG tablet Take 1 tablet (75 mg total) by mouth daily. 11/11/19   Annice Needy, MD  cyanocobalamin (,VITAMIN B-12,) 1000 MCG/ML injection ADMINISTER 1 ML(1000 MCG) IN THE MUSCLE EVERY 30 DAYS 03/12/20   Olevia Perches P, DO  diclofenac Sodium (VOLTAREN) 1 % GEL APPLY 4 GRAMS EXTERNALLY TO THE AFFECTED AREA FOUR TIMES DAILY 08/10/19   Laural Benes, Megan P, DO  famotidine (PEPCID) 20 MG tablet TAKE 1 TABLET(20 MG) BY MOUTH AT BEDTIME 12/11/20   Johnson, Megan P, DO  fenofibrate micronized (LOFIBRA) 200 MG capsule TAKE 1 CAPSULE(200 MG) BY MOUTH DAILY 12/11/20   Johnson, Megan P, DO  Insulin Syringe 27G X 1/2" 1 ML MISC 1 each by Does not apply route every 30 (thirty) days. 02/08/19   Johnson, Megan P, DO  isosorbide mononitrate (IMDUR) 60 MG 24 hr tablet Take 1 tablet (60 mg total) by mouth in the morning and at bedtime. 05/01/20   Alver Sorrow, NP  lamoTRIgine (LAMICTAL) 25 MG tablet Take by mouth. 09/06/20   [provider]  lidocaine (LIDODERM) 5 % 2 patches daily. 11/23/19   [provider]  lisinopril (ZESTRIL) 40 MG tablet TAKE 1 TABLET(40 MG) BY MOUTH TWICE DAILY 12/11/20   Olevia Perches P, DO  loperamide (IMODIUM) 2 MG capsule Take 2 mg by mouth  as needed for diarrhea or loose stools.    [provider]  nicotine (NICODERM CQ) 21 mg/24hr patch Place 1 patch (21 mg total) onto the skin daily. 12/11/20   Johnson, Megan P, DO  nitroGLYCERIN (NITROSTAT) 0.4 MG SL tablet Place 1 tablet (0.4 mg total) under the tongue every 5 (five) minutes as needed for chest pain. 05/01/20   Alver Sorrow, NP  nystatin ointment (MYCOSTATIN) Apply 1 application topically 2 (two) times daily. 09/13/20   Johnson, Megan P, DO  omeprazole (PRILOSEC) 40 MG capsule TAKE 1 CAPSULE BY MOUTH TWICE DAILY 12/11/20   Laural Benes, Megan P, DO  ondansetron (ZOFRAN) 4 MG tablet Take 1 tablet (4 mg total) by mouth every 6 (six) hours as needed for nausea or vomiting. 09/13/20   Olevia Perches P, DO  oxyCODONE-acetaminophen (PERCOCET/ROXICET) 5-325 MG tablet Take 1 tablet by mouth 4 (four) times daily as needed. 08/16/20   [provider]  pregabalin (LYRICA) 150 MG  capsule Take 1 capsule (150 mg total) by mouth 2 (two) times daily. 12/11/20   Johnson, Megan P, DO  rosuvastatin (CRESTOR) 40 MG tablet Take 1 tablet (40 mg total) by mouth daily. 10/10/20 01/08/21  Alver Sorrow, NP  traZODone (DESYREL) 50 MG tablet Take 1 tablet (50 mg total) by mouth at bedtime. 09/13/20   Dorcas Carrow, DO   Physical Exam: Vitals:   01/07/21 1717 01/07/21 1835 01/07/21 1900 01/07/21 1930  BP:  (!) 149/62 (!) 126/58 130/65  Pulse:  70 70 65  Resp:  15 11 12   Temp: 97.6 F (36.4 C)     TempSrc: Oral     SpO2:  98% 95% 94%  Weight:      Height:       Constitutional: appears age-appropriate, NAD, calm, comfortable Eyes: PRRL, right pupil is 4 mm, left pupil is 2.5 mm, lids and conjunctivae normal ENMT: Mucous membranes are moist. Posterior pharynx clear of any exudate or lesions. Age-appropriate dentition. Hearing appropriate Neck: normal, supple, no masses, no thyromegaly Respiratory: clear to auscultation bilaterally, no wheezing, no crackles. Normal respiratory effort.  No accessory muscle use.  Cardiovascular: Regular rate and rhythm, no murmurs / rubs / gallops.  Trace bilateral lower extremity edema. 2+ pedal pulses. No carotid bruits.  Abdomen: no tenderness, no masses palpated, no hepatosplenomegaly. Bowel sounds positive.  Musculoskeletal: no clubbing / cyanosis. No joint deformity upper and lower extremities. Good ROM, no contractures, no atrophy. Normal muscle tone.  Skin: no rashes, lesions, ulcers. No induration Neurologic: Sensation intact. Strength 5/5 in all 4.  Psychiatric: Normal judgment and insight. Alert and oriented x 3. Normal mood.   EKG: independently reviewed, showing sinus rhythm with rate of 64, QTc 474  Chest x-ray on Admission: I personally reviewed and I agree with radiologist reading as below.  MR Brain W and Wo Contrast  Result Date: 01/07/2021 CLINICAL DATA:  Mental status change, unknown cause. EXAM: MRI HEAD WITHOUT AND WITH CONTRAST TECHNIQUE: Multiplanar, multiecho pulse sequences of the brain and surrounding structures were obtained without and with intravenous contrast. CONTRAST:  7.59mL GADAVIST GADOBUTROL 1 MMOL/ML IV SOLN COMPARISON:  Brain MRI 05/10/2020. FINDINGS: Brain: Intermittently motion degraded examination, limiting evaluation. Most notably, there is moderate/severe motion degradation of the axial T1 weighted postcontrast sequence, coronal T1 weighted postcontrast sequence and sagittal T1 weighted postcontrast sequence. Mild generalized cerebral and cerebellar atrophy. Redemonstrated chronic cortical/subcortical infarcts within the bilateral frontal lobes, bilateral parietal lobes, bilateral occipital lobes and posterior left temporal lobe. Subcentimeter focus of diffusion weighted hyperintensity within the left occipital infarction territory, which is favored related to chronic hemosiderin deposition at this site rather than superimposed acute infarct. No evidence of acute infarct elsewhere. Redemonstrated chronic  lacunar infarcts within the bilateral basal ganglia, thalami and cerebellar hemispheres. Background moderate cerebral white matter chronic small vessel ischemic disease. No evidence of intracranial mass. No extra-axial fluid collection. No midline shift. No abnormal intracranial enhancement. Vascular: Expected proximal arterial flow voids. Skull and upper cervical spine: No focal marrow lesion. Sinuses/Orbits: Visualized orbits show no acute finding. No significant paranasal sinus disease. IMPRESSION: Motion degraded examination, as described and limiting evaluation. Redemonstrated chronic cortical/subcortical infarcts within the bilateral frontal, parietal, occipital and left temporal lobes. A subcentimeter focus of diffusion weighted hyperintensity within the left occipital lobe infarction territory is favored secondary to chronic hemosiderin deposition or cortical laminar necrosis at this site, rather than superimposed acute infarct. Redemonstrated chronic lacunar infarcts within the bilateral basal ganglia, thalami and  cerebellar hemispheres. Background moderate cerebral white matter chronic small vessel ischemic disease, and mild generalized parenchymal atrophy. Electronically Signed   By: Jackey Loge DO   On: 01/07/2021 18:12   DG Chest Portable 1 View  Result Date: 01/07/2021 CLINICAL DATA:  Altered mental status.  Confusion and weakness EXAM: PORTABLE CHEST 1 VIEW COMPARISON:  None. FINDINGS: The heart size and mediastinal contours are within normal limits. Both lungs are clear. The visualized skeletal structures are unremarkable. IMPRESSION: No active disease. Electronically Signed   By: Marlan Palau M.D.   On: 01/07/2021 17:19    Labs on Admission: I have personally reviewed following labs  CBC: Recent Labs  Lab 01/07/21 1348  WBC 6.5  HGB 13.1  HCT 39.8  MCV 85.6  PLT 166   Basic Metabolic Panel: Recent Labs  Lab 01/07/21 1348  NA 140  K 3.5  CL 110  CO2 23  GLUCOSE 125*  BUN  30*  CREATININE 1.03*  CALCIUM 9.3  GFR: Estimated Creatinine Clearance: 49.9 mL/min (A) (by C-G formula based on SCr of 1.03 mg/dL (H)).  Liver Function Tests: Recent Labs  Lab 01/07/21 1348  AST 24  ALT 17  ALKPHOS 51  BILITOT 0.9  PROT 7.7  ALBUMIN 4.6   Cardiac Enzymes: Recent Labs  Lab 01/07/21 1848  CKTOTAL 227   Urine analysis:    Component Value Date/Time   COLORURINE YELLOW (A) 01/07/2021 1350   APPEARANCEUR HAZY (A) 01/07/2021 1350   APPEARANCEUR Clear 11/12/2019 1431   LABSPEC 1.021 01/07/2021 1350   PHURINE 5.0 01/07/2021 1350   GLUCOSEU NEGATIVE 01/07/2021 1350   HGBUR NEGATIVE 01/07/2021 1350   BILIRUBINUR NEGATIVE 01/07/2021 1350   BILIRUBINUR Negative 11/12/2019 1431   KETONESUR NEGATIVE 01/07/2021 1350   PROTEINUR 30 (A) 01/07/2021 1350   NITRITE NEGATIVE 01/07/2021 1350   LEUKOCYTESUR SMALL (A) 01/07/2021 1350   Dr. Sedalia Muta [facility]  If 7PM-7AM, please contact overnight-coverage provider If 7AM-7PM, please contact day coverage provider [email or web address]  01/07/2021, 7:45 PM

## 2021-01-07 NOTE — ED Notes (Signed)
Admitting MD Cox at bedside.

## 2021-01-07 NOTE — ED Provider Notes (Signed)
[facility] Emergency Department Provider Note  ____________________________________________   Event Date/Time   First MD Initiated Contact with Patient 01/07/21 1559     (approximate)  I have reviewed the triage vital signs and the nursing notes.   HISTORY  Chief Complaint Altered Mental Status    HPI Tara [name] is a 75 y.o. female with extensive past medical history as below here with altered mental status.  History provided primarily by the patient's daughter.  Per report, the patient was in her usual state of health earlier today.  The patient had reportedly fallen out of bed.  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  Patient's daughter came to check on her.  She states that fairly abruptly, the patient became confused.  She became very weak and was unable to stand up or get her legs to coordinate removed.  She had associated significant fatigue.  She reportedly had difficulty following commands.  She had slightly slurred speech as well.  She states that she was awake remembers this, but was reportedly too fatigued to respond or do what she needed to do.  Denies any recent changes in medication.  No recent illness.  She does note some dysuria over the last several days.  No known fevers.    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  08/06/2020   History of stroke 07/25/2020   Memory loss, short term 07/25/2020   Visual disturbance 07/25/2020   Tremor 07/25/2020   Difficulty walking 03/09/2020   TIA (transient ischemic attack) 03/09/2020   Polyneuropathy, unspecified 12/08/2019   Difficulty sleeping 10/29/2019   Numbness 10/29/2019   Right leg numbness 10/29/2019   Numbness and tingling in left hand 10/29/2019   Lymphedema 10/26/2019   Atherosclerosis of native arteries of extremity with intermittent claudication ([place]) 10/26/2019   Nerve pain 10/12/2019   Memory loss 10/12/2019   PAD (peripheral artery disease) ([place]) 08/02/2019   Temporal arteritis (HCC)    Other intervertebral disc degeneration, thoracic region 06/21/2019   GERD (gastroesophageal reflux disease) 06/21/2019   Angiodysplasia of intestinal tract    Intestinal bypass or anastomosis status    History of aortic valve replacement 02/07/2019   B12 deficiency 02/07/2019   Anxiety 02/05/2019   HTN (hypertension) 02/05/2019   Hyperlipidemia 02/05/2019   IBS (irritable bowel syndrome) 02/05/2019   Arthritis of both knees 02/05/2019   CTS (carpal tunnel syndrome) 02/05/2019    Past Surgical History:  Procedure Laterality Date   ABDOMINAL AORTOGRAM W/LOWER EXTREMITY N/A 07/28/2019   Procedure: ABDOMINAL AORTOGRAM W/LOWER EXTREMITY;  Surgeon: Wellington Hampshire, MD;  Location: [place] CV LAB;  Service: Cardiovascular;  Laterality: N/A;   ABDOMINAL HYSTERECTOMY     ARTERY BIOPSY Right 07/12/2019   Procedure: BIOPSY TEMPORAL ARTERY;  Surgeon: Olean Ree, MD;  Location:  [facility] ORS;  Service: General;  Laterality: Right;   BREAST BIOPSY Left 2007   [place] VALVE REPLACEMENT     CHOLECYSTECTOMY     COLON SURGERY     COLONOSCOPY WITH PROPOFOL N/A 04/09/2019   Procedure: COLONOSCOPY WITH PROPOFOL;  Surgeon: Virgel Manifold, MD;  Location: [facility] ENDOSCOPY;  Service: Endoscopy;  Laterality: N/A;   CORONARY  ANGIOPLASTY WITH STENT PLACEMENT  2008   [place]; [facility] Stent placement x 1    DENTAL SURGERY     ESOPHAGOGASTRODUODENOSCOPY (EGD) WITH PROPOFOL N/A 04/09/2019   Procedure: ESOPHAGOGASTRODUODENOSCOPY (EGD) WITH PROPOFOL;  Surgeon: Virgel Manifold, MD;  Location: [facility] ENDOSCOPY;  Service: Endoscopy;  Laterality: N/A;   EXCISIONAL HEMORRHOIDECTOMY     EYE SURGERY     FEMORAL-POPLITEAL BYPASS GRAFT Right 08/02/2019   Procedure: BYPASS GRAFT FEMORAL-POPLITEAL ARTERY;  Surgeon: Rosetta Posner, MD;  Location: [facility] OR;  Service: Vascular;  Laterality: Right;   HERNIA REPAIR     LOWER EXTREMITY ANGIOGRAPHY Left 11/11/2019   Procedure: LOWER EXTREMITY ANGIOGRAPHY;  Surgeon: Algernon Huxley, MD;  Location: [place] CV LAB;  Service: Cardiovascular;  Laterality: Left;   MULTIPLE TOOTH EXTRACTIONS     TONSILLECTOMY      Prior to Admission medications   Medication Sig Start Date End Date Taking? Authorizing Provider  amLODipine (NORVASC) 10 MG tablet Take 1 tablet (10 mg total) by mouth daily. 12/11/20   Park Liter P, DO  aspirin EC 81 MG tablet Take 1 tablet (81 mg total) by mouth daily. 11/11/19   Algernon Huxley, MD  B-D TB SYRINGE 1CC/27GX1/2" 27G X 1/2" 1 ML MISC USE AS DIRECTED EVERY 30 DAYS 11/06/19   [provider]  buPROPion (WELLBUTRIN SR) 150 MG 12 hr tablet Take 1 tablet (150 mg total) by mouth 2 (two) times daily. 12/11/20   Johnson, Megan P, DO  citalopram (CELEXA) 40 MG tablet Take 1 tablet (40 mg total) by mouth daily. 12/11/20   Park Liter P, DO  clopidogrel (PLAVIX) 75 MG tablet Take 1 tablet (75 mg total) by mouth daily. 11/11/19   Algernon Huxley, MD  cyanocobalamin (,VITAMIN B-12,) 1000 MCG/ML injection ADMINISTER 1 ML(1000 MCG) IN THE MUSCLE EVERY 30 DAYS 03/12/20   Park Liter P, DO  diclofenac Sodium (VOLTAREN) 1 % GEL APPLY 4 GRAMS EXTERNALLY TO THE AFFECTED AREA FOUR TIMES DAILY 08/10/19   Wynetta Emery, Megan P, DO  famotidine (PEPCID) 20 MG tablet TAKE  1 TABLET(20 MG) BY MOUTH AT BEDTIME 12/11/20   Johnson, Megan P, DO  fenofibrate micronized (LOFIBRA) 200 MG capsule TAKE 1 CAPSULE(200 MG) BY MOUTH DAILY 12/11/20   Johnson, Megan P, DO  Insulin Syringe 27G X 1/2" 1 ML MISC 1 each by Does not apply route every 30 (thirty) days. 02/08/19   Johnson, Megan P, DO  isosorbide mononitrate (IMDUR) 60 MG 24 hr tablet Take 1 tablet (60 mg total) by mouth in the morning and at bedtime. 05/01/20   Loel Dubonnet, NP  lamoTRIgine (LAMICTAL) 25 MG tablet Take by mouth. 09/06/20   [provider]  lidocaine (LIDODERM) 5 % 2 patches daily. 11/23/19   [provider]  lisinopril (ZESTRIL) 40 MG tablet TAKE 1 TABLET(40 MG) BY MOUTH TWICE DAILY 12/11/20   Park Liter P, DO  loperamide (IMODIUM) 2 MG capsule Take 2 mg by mouth as needed for diarrhea or loose stools.    [provider]  nicotine (NICODERM CQ) 21 mg/24hr patch Place 1 patch (21 mg total) onto the skin daily. 12/11/20   Johnson, Megan P, DO  nitroGLYCERIN (NITROSTAT) 0.4 MG SL tablet Place 1 tablet (0.4 mg total) under the tongue every 5 (five) minutes as needed for chest pain. 05/01/20   Loel Dubonnet, NP  nystatin ointment (MYCOSTATIN) Apply 1 application topically 2 (two) times daily. 09/13/20   Johnson, Megan P, DO  omeprazole (PRILOSEC) 40 MG capsule TAKE 1 CAPSULE BY MOUTH TWICE DAILY 12/11/20   Wynetta Emery, Megan P, DO  ondansetron (ZOFRAN) 4 MG tablet Take 1 tablet (4 mg total) by mouth every 6 (six) hours as needed for nausea or vomiting. 09/13/20   Park Liter P, DO  oxyCODONE-acetaminophen (PERCOCET/ROXICET) 5-325 MG tablet Take 1 tablet by mouth 4 (four) times daily as needed. 08/16/20   [provider]  pregabalin (LYRICA) 150 MG capsule Take 1 capsule (150 mg total) by mouth 2 (two) times daily. 12/11/20   Johnson, Megan P, DO  rosuvastatin (CRESTOR) 40 MG tablet Take 1 tablet (40 mg total) by mouth daily. 10/10/20 01/08/21  Loel Dubonnet, NP  traZODone  (DESYREL) 50 MG tablet Take 1 tablet (50 mg total) by mouth at bedtime. 09/13/20   Valerie Roys, DO    Allergies Patient has no known allergies.  Family History  Adopted: Yes  Problem Relation Age of Onset   Breast cancer Mother 69    Social History Social History   Tobacco Use   Smoking status: Light Smoker    Packs/day: 0.25    Years: 3.00    Pack years: 0.75    Types: Cigarettes   Smokeless tobacco: Never   Tobacco comments:    " 3 cigarettes per day "-1 pack per week  Vaping Use   Vaping Use: Never used  Substance Use Topics   Alcohol use: Never   Drug use: Never    Review of Systems  Review of Systems  Constitutional:  Negative for fatigue and fever.  HENT:  Negative for congestion and sore throat.   Eyes:  Negative for visual disturbance.  Respiratory:  Negative for cough and shortness of breath.   Cardiovascular:  Negative for chest pain.  Gastrointestinal:  Negative for abdominal pain, diarrhea, nausea and vomiting.  Genitourinary:  Positive for dysuria and frequency. Negative for flank pain.  Musculoskeletal:  Negative for back pain and neck pain.  Skin:  Negative for rash and wound.  Neurological:  Positive for speech difficulty and weakness.  All other systems reviewed and are negative.   ____________________________________________  PHYSICAL EXAM:      VITAL SIGNS: ED Triage Vitals  Enc Vitals Group     BP 01/07/21 1615 (!) 157/60     Pulse Rate 01/07/21 1615 65     Resp 01/07/21 1615 20     Temp --      Temp src --      SpO2 01/07/21 1615 93 %     Weight 01/07/21 1343 171 lb 15.3 oz (78 kg)     Height 01/07/21 1343 _0  (1.626 m)     Head Circumference --      Peak Flow --      Pain Score 01/07/21 1343 0     Pain Loc --      Pain Edu? --      Excl. in [place]? --      Physical Exam Vitals and nursing note reviewed.  Constitutional:  General: She is not in acute distress.    Appearance: She is well-developed.  HENT:     Head:  Normocephalic and atraumatic.  Eyes:     Conjunctiva/sclera: Conjunctivae normal.  Cardiovascular:     Rate and Rhythm: Normal rate and regular rhythm.     Heart sounds: Normal heart sounds. No murmur heard.   No friction rub.  Pulmonary:     Effort: Pulmonary effort is normal. No respiratory distress.     Breath sounds: Normal breath sounds. No wheezing or rales.  Abdominal:     General: There is no distension.     Palpations: Abdomen is soft.     Tenderness: There is no abdominal tenderness.  Musculoskeletal:     Cervical back: Neck supple.  Skin:    General: Skin is warm.     Capillary Refill: Capillary refill takes less than 2 seconds.  Neurological:     Mental Status: She is alert and oriented to person, place, and time.     Motor: No abnormal muscle tone.     Comments: Vision intact to light/dark only, which is baseline.  No other cranial nerve deficits.  Strength is antigravity bilateral upper and lower extremities, though significantly weak in bilateral lower extremities.  Endorses normal sensation to light touch bilateral upper and lower extremities.  Gait deferred.      ____________________________________________   LABS (all labs ordered are listed, but only abnormal results are displayed)  Labs Reviewed  COMPREHENSIVE METABOLIC PANEL - Abnormal; Notable for the following components:      Result Value   Glucose, Bld 125 (*)    BUN 30 (*)    Creatinine, Ser 1.03 (*)    GFR, Estimated 58 (*)    All other components within normal limits  URINALYSIS, COMPLETE (UACMP) WITH MICROSCOPIC - Abnormal; Notable for the following components:   Color, Urine YELLOW (*)    APPearance HAZY (*)    Protein, ur 30 (*)    Leukocytes,Ua SMALL (*)    Bacteria, UA RARE (*)    All other components within normal limits  BLOOD GAS, VENOUS - Abnormal; Notable for the following components:   pCO2, Ven 79 (*)    Bicarbonate 39.8 (*)    Acid-Base Excess 10.3 (*)    All other components  within normal limits  RESP PANEL BY RT-PCR (FLU A&B, COVID) ARPGX2  URINE CULTURE  CBC  SEDIMENTATION RATE  CK  C-REACTIVE PROTEIN  VITAMIN B12  VITAMIN B1  D-DIMER, QUANTITATIVE  AMMONIA  BASIC METABOLIC PANEL  CBC  TROPONIN I (HIGH SENSITIVITY)  TROPONIN I (HIGH SENSITIVITY)    ____________________________________________  EKG: Normal sinus rhythm, ventricular rate 64.  PR 203, QRS 112, QTc 474.  Incomplete left bundle branch block.  No acute ST elevations or depressions. ________________________________________  RADIOLOGY All imaging, including plain films, CT scans, and ultrasounds, independently reviewed by me, and interpretations confirmed via formal radiology reads.  ED MD interpretation:   CXR: Clear MR Brain: Multiple chronic infarcts, no acute changes  Official radiology report(s): MR Brain W and Wo Contrast  Result Date: 01/07/2021 CLINICAL DATA:  Mental status change, unknown cause. EXAM: MRI HEAD WITHOUT AND WITH CONTRAST TECHNIQUE: Multiplanar, multiecho pulse sequences of the brain and surrounding structures were obtained without and with intravenous contrast. CONTRAST:  7.94m GADAVIST GADOBUTROL 1 MMOL/ML IV SOLN COMPARISON:  Brain MRI 05/10/2020. FINDINGS: Brain: Intermittently motion degraded examination, limiting evaluation. Most notably, there is moderate/severe motion degradation of the axial T1  weighted postcontrast sequence, coronal T1 weighted postcontrast sequence and sagittal T1 weighted postcontrast sequence. Mild generalized cerebral and cerebellar atrophy. Redemonstrated chronic cortical/subcortical infarcts within the bilateral frontal lobes, bilateral parietal lobes, bilateral occipital lobes and posterior left temporal lobe. Subcentimeter focus of diffusion weighted hyperintensity within the left occipital infarction territory, which is favored related to chronic hemosiderin deposition at this site rather than superimposed acute infarct. No evidence of  acute infarct elsewhere. Redemonstrated chronic lacunar infarcts within the bilateral basal ganglia, thalami and cerebellar hemispheres. Background moderate cerebral white matter chronic small vessel ischemic disease. No evidence of intracranial mass. No extra-axial fluid collection. No midline shift. No abnormal intracranial enhancement. Vascular: Expected proximal arterial flow voids. Skull and upper cervical spine: No focal marrow lesion. Sinuses/Orbits: Visualized orbits show no acute finding. No significant paranasal sinus disease. IMPRESSION: Motion degraded examination, as described and limiting evaluation. Redemonstrated chronic cortical/subcortical infarcts within the bilateral frontal, parietal, occipital and left temporal lobes. A subcentimeter focus of diffusion weighted hyperintensity within the left occipital lobe infarction territory is favored secondary to chronic hemosiderin deposition or cortical laminar necrosis at this site, rather than superimposed acute infarct. Redemonstrated chronic lacunar infarcts within the bilateral basal ganglia, thalami and cerebellar hemispheres. Background moderate cerebral white matter chronic small vessel ischemic disease, and mild generalized parenchymal atrophy. Electronically Signed   By: Kellie Simmering DO   On: 01/07/2021 18:12   DG Chest Portable 1 View  Result Date: 01/07/2021 CLINICAL DATA:  Altered mental status.  Confusion and weakness EXAM: PORTABLE CHEST 1 VIEW COMPARISON:  None. FINDINGS: The heart size and mediastinal contours are within normal limits. Both lungs are clear. The visualized skeletal structures are unremarkable. IMPRESSION: No active disease. Electronically Signed   By: Franchot Gallo M.D.   On: 01/07/2021 17:19    ____________________________________________  PROCEDURES   Procedure(s) performed (including Critical Care):  Procedures  ____________________________________________  INITIAL IMPRESSION / MDM / [place] / ED COURSE  As part of my medical decision making, I reviewed the following data within the [facility] notes reviewed and incorporated, Old chart reviewed, Notes from prior ED visits, and [place] Controlled Substance Database       *Daana Petrasek Levene was evaluated in Emergency Department on 01/07/2021 for the symptoms described in the history of present illness. She was evaluated in the context of the global COVID-19 pandemic, which necessitated consideration that the patient might be at risk for infection with the SARS-CoV-2 virus that causes COVID-19. Institutional protocols and algorithms that pertain to the evaluation of patients at risk for COVID-19 are in a state of rapid change based on information released by regulatory bodies including the CDC and federal and state organizations. These policies and algorithms were followed during the patient's care in the ED.  Some ED evaluations and interventions may be delayed as a result of limited staffing during the pandemic.*     Medical Decision Making:  74 yo F here with generalized weakness, AMS. DDx includes acute CVA, encephalopathy 2/2 UTI or occult metabolic process, less likely myasthenia/neuromuscular do. Screening labs reviewed. WBC normal. CMP with mild dehydration with elevated BUN:Cr ratio - IVF given. UA concerning for UTI. Will treat, check CXR and MR Brain, admit for weakness, inability to ambulate.  MRI negative for acute stroke.  Will admit.  Of note, patient has mild CO2 retention on blood gas.  Will add on a negative respiratory force to evaluate for possible neuromuscular disease.  CK, ESR, CRP  added on.  BiPAP ordered.  Admit to medicine.  ____________________________________________  FINAL CLINICAL IMPRESSION(S) / ED DIAGNOSES  Final diagnoses:  Acute encephalopathy  Acute cystitis without hematuria  Hypercapnia     MEDICATIONS GIVEN DURING THIS VISIT:  Medications  aspirin EC tablet 81 mg  (has no administration in time range)  amLODipine (NORVASC) tablet 10 mg (has no administration in time range)  lisinopril (ZESTRIL) tablet 40 mg (has no administration in time range)  nitroGLYCERIN (NITROSTAT) SL tablet 0.4 mg (has no administration in time range)  rosuvastatin (CRESTOR) tablet 40 mg (has no administration in time range)  buPROPion (WELLBUTRIN SR) 12 hr tablet 150 mg (has no administration in time range)  citalopram (CELEXA) tablet 40 mg (has no administration in time range)  nicotine (NICODERM CQ - dosed in mg/24 hours) patch 21 mg (has no administration in time range)  clopidogrel (PLAVIX) tablet 75 mg (has no administration in time range)  lamoTRIgine (LAMICTAL) tablet 50 mg (has no administration in time range)  acetaminophen (TYLENOL) tablet 650 mg (has no administration in time range)    Or  acetaminophen (TYLENOL) suppository 650 mg (has no administration in time range)  ondansetron (ZOFRAN) tablet 4 mg (has no administration in time range)    Or  ondansetron (ZOFRAN) injection 4 mg (has no administration in time range)  enoxaparin (LOVENOX) injection 40 mg (has no administration in time range)  gadobutrol (GADAVIST) 1 MMOL/ML injection 7.5 mL (7.5 mLs Intravenous Contrast Given 01/07/21 1753)     ED Discharge Orders     None        Note:  This document was prepared using Dragon voice recognition software and may include unintentional dictation errors.   Duffy Bruce, MD 01/07/21 1945

## 2021-01-07 NOTE — ED Notes (Signed)
Pt unable to bear weight for transfer from wheelchair to bed with 2 RN assist. Pt moved via lateral transfer by 4 RNs.

## 2021-01-07 NOTE — ED Notes (Signed)
XR at bedside

## 2021-01-07 NOTE — ED Notes (Signed)
Pt in MRI.

## 2021-01-07 NOTE — ED Notes (Signed)
Med verification request sent to pharmacy.   Pt c/o chronic R knee pain increasing at this time and sts she uses oxycodone at home for pain management. Pt declined ordered tylenol at this time.

## 2021-01-08 ENCOUNTER — Inpatient Hospital Stay: Payer: Medicare Other

## 2021-01-08 ENCOUNTER — Encounter: Payer: Self-pay | Admitting: Internal Medicine

## 2021-01-08 DIAGNOSIS — G9340 Encephalopathy, unspecified: Secondary | ICD-10-CM

## 2021-01-08 LAB — TSH: TSH: 4.286 u[IU]/mL (ref 0.350–4.500)

## 2021-01-08 LAB — C-REACTIVE PROTEIN: CRP: 0.6 mg/dL (ref <1.0)

## 2021-01-08 LAB — BASIC METABOLIC PANEL
Anion gap: 6 (ref 5–15)
BUN: 31 mg/dL — ABNORMAL HIGH (ref 8–23)
CO2: 26 mmol/L (ref 22–32)
Calcium: 9.1 mg/dL (ref 8.9–10.3)
Chloride: 110 mmol/L (ref 98–111)
Creatinine, Ser: 1.13 mg/dL — ABNORMAL HIGH (ref 0.44–1.00)
GFR, Estimated: 52 mL/min — ABNORMAL LOW (ref >60)
Glucose, Bld: 125 mg/dL — ABNORMAL HIGH (ref 70–99)
Potassium: 3.9 mmol/L (ref 3.5–5.1)
Sodium: 142 mmol/L (ref 135–145)

## 2021-01-08 LAB — CBC
HCT: 39.9 % (ref 36.0–46.0)
Hemoglobin: 12.9 g/dL (ref 12.0–15.0)
MCH: 27.2 pg (ref 26.0–34.0)
MCHC: 32.3 g/dL (ref 30.0–36.0)
MCV: 84.2 fL (ref 80.0–100.0)
Platelets: 162 10*3/uL (ref 150–400)
RBC: 4.74 MIL/uL (ref 3.87–5.11)
RDW: 15.2 % (ref 11.5–15.5)
WBC: 5 10*3/uL (ref 4.0–10.5)
nRBC: 0 % (ref 0.0–0.2)

## 2021-01-08 LAB — VITAMIN B12: Vitamin B-12: 186 pg/mL (ref 180–914)

## 2021-01-08 LAB — BRAIN NATRIURETIC PEPTIDE: B Natriuretic Peptide: 134.9 pg/mL — ABNORMAL HIGH (ref 0.0–100.0)

## 2021-01-08 IMAGING — MR MR LUMBAR SPINE WO/W CM
4 of 7 series · 28 of 48 positions shown · IV contrast (gadavist)
Comparison: Prior radiograph from [DATE].

CLINICAL DATA: Initial evaluation for acute bilateral lower
extremity weakness, myelopathy, frequent falls. B12 deficiency.

EXAM:
MRI THORACIC AND LUMBAR SPINE WITHOUT AND WITH CONTRAST
TECHNIQUE: Multiplanar and multiecho pulse sequences of the thoracic and lumbar
spine were obtained without and with intravenous contrast.
CONTRAST:  7mL GADAVIST GADOBUTROL 1 MMOL/ML IV SOLN

[Series 31: T2 · sagittal · 4.0mm · 0.81mm/px · 6 of 18 slices shown (1 of 2)]
[im 1/18]
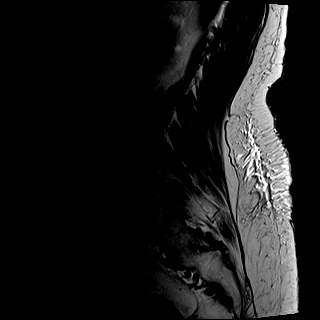
[im 4/18]
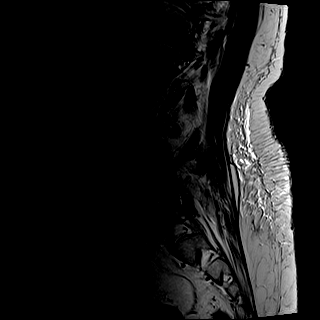
[im 7/18]
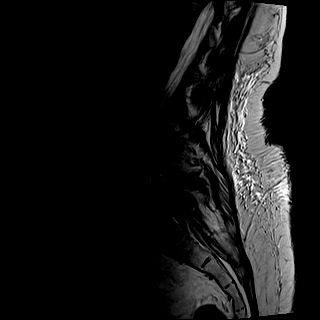
[im 11/18]
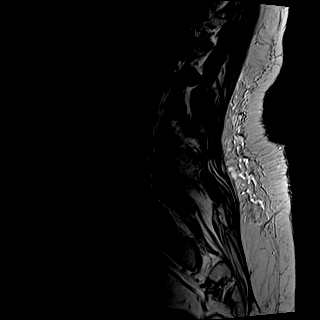
[im 14/18]
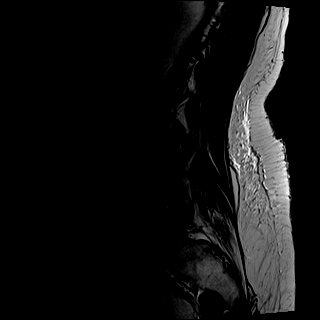
[im 18/18]
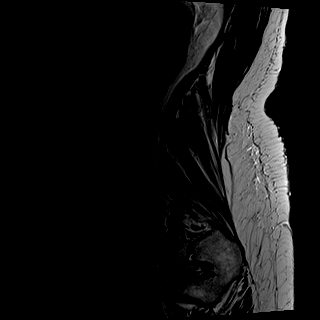

[Series 32: T1 · sagittal · 4.0mm · 0.81mm/px · 5 of 18 slices shown (1 of 2)]
[im 1/18]
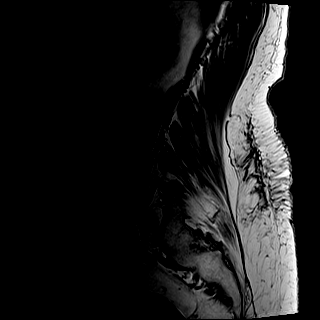
[im 5/18]
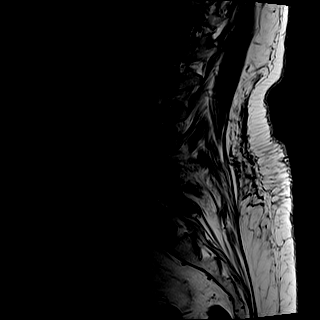
[im 9/18]
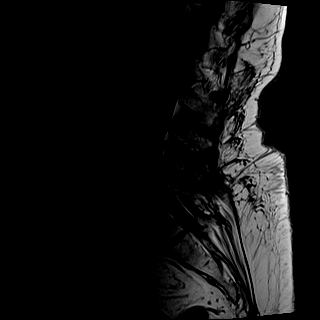
[im 13/18]
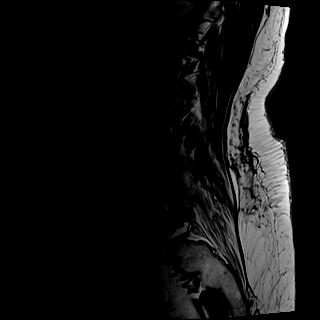
[im 18/18]
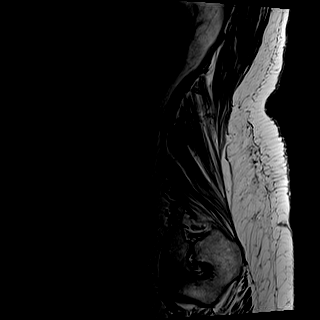

[Series 34: T2 · axial · 4.0mm · 0.78mm/px · z∈[-522,-305]mm · 9 of 30 slices shown (2 of 2)]
[im 1/30]
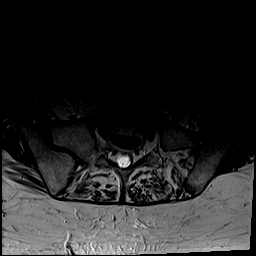
[im 4/30]
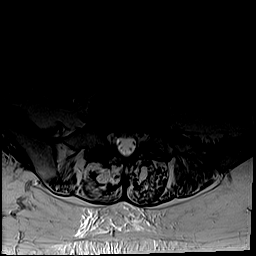
[im 8/30]
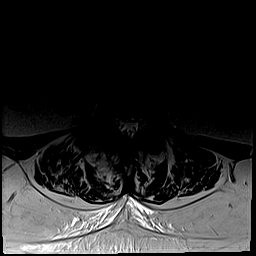
[im 11/30]
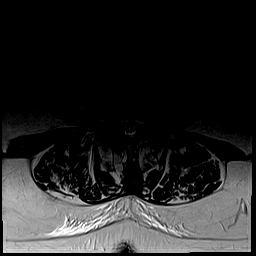
[im 15/30]
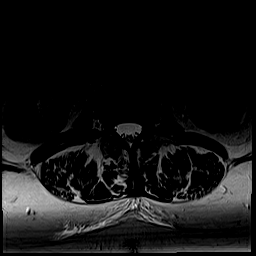
[im 19/30]
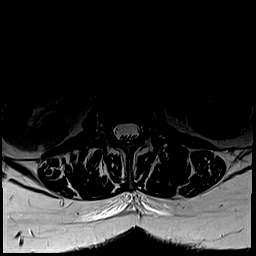
[im 22/30]
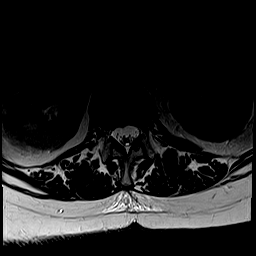
[im 26/30]
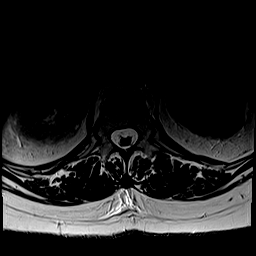
[im 30/30]
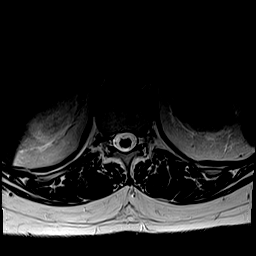

[Series 35: T1 · axial · 4.0mm · 0.39mm/px · z∈[-522,-324]mm · 8 of 30 slices shown (2 of 2)]
[im 1/30]
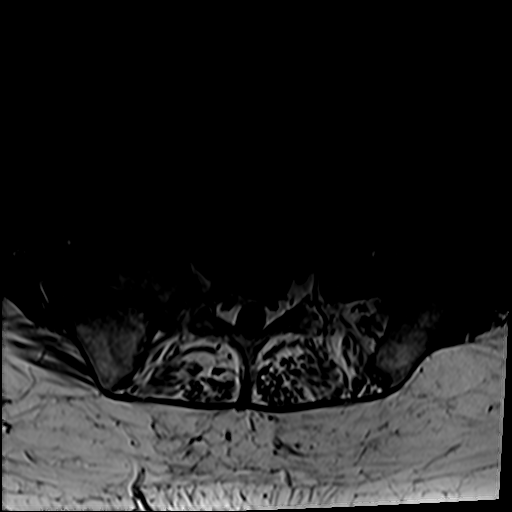
[im 4/30]
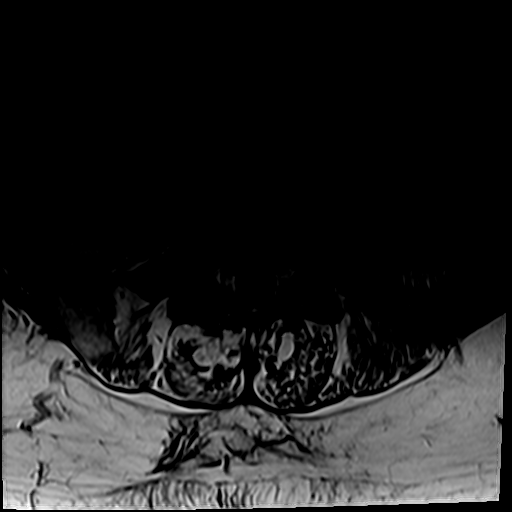
[im 8/30]
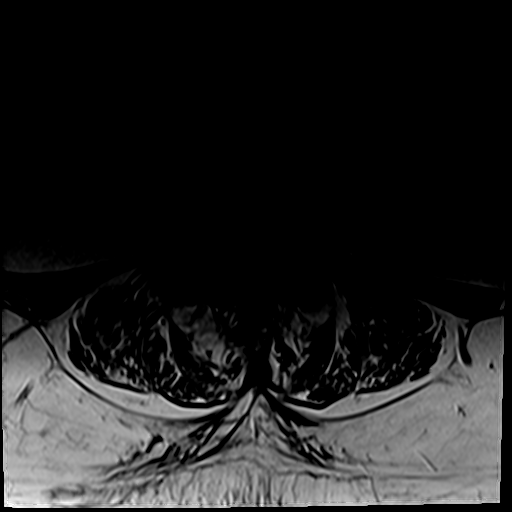
[im 11/30]
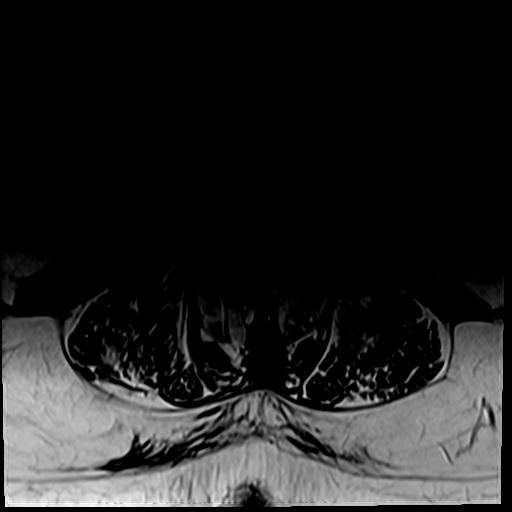
[im 15/30]
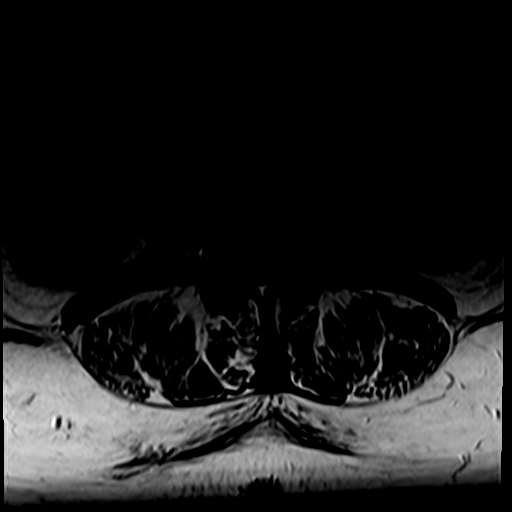
[im 19/30]
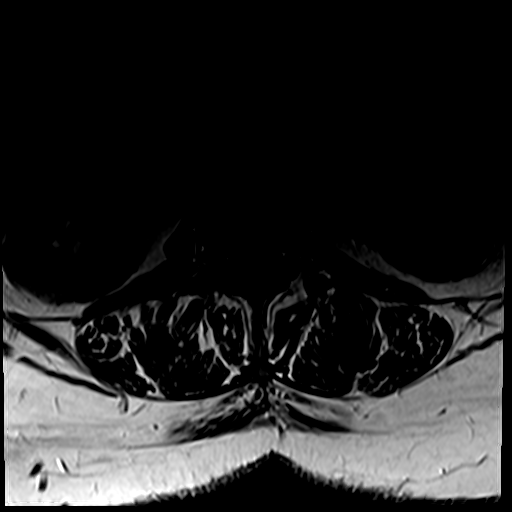
[im 22/30]
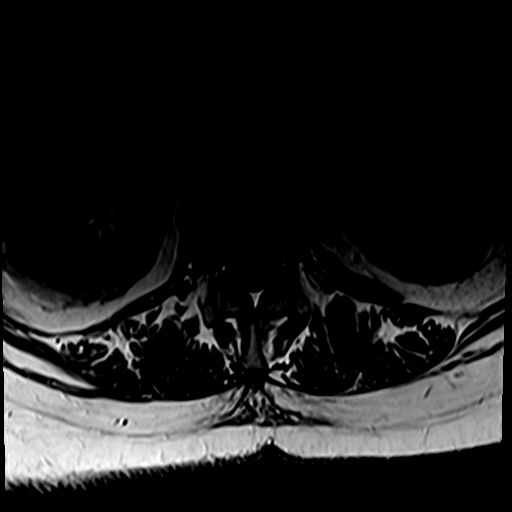
[im 26/30]
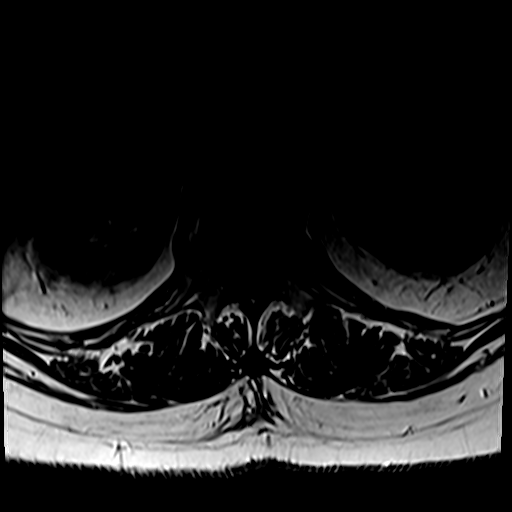

[28 of 48 positions shown; findings below may reference images not displayed]

FINDINGS: MRI THORACIC SPINE FINDINGS

Alignment: Physiologic with preservation of the normal thoracic
kyphosis. No significant listhesis.

Vertebrae: Vertebral body height maintained without acute or chronic
fracture. Bone marrow signal intensity within normal limits.
Subcentimeter benign hemangioma noted within the T11 vertebral body.
No worrisome osseous lesions. Mild discogenic reactive endplate
change noted about the partially visualized C6-7 interspace. No
other abnormal marrow edema or enhancement.

Cord: Normal signal and morphology. No signal changes to suggest
subacute combined degeneration. No abnormal enhancement. Normal cord
caliber and morphology.

Paraspinal and other soft tissues: Paraspinous soft tissues within
normal limits. Few small benign appearing cyst noted within the
visualized kidneys. Visualized visceral structures otherwise
unremarkable.

Disc levels:

T1-2:  Unremarkable.

T2-3: Unremarkable.

T3-4:  Unremarkable.

T4-5:  Unremarkable.

T5-6: Small right central disc protrusion mildly indents the ventral
thecal sac. No stenosis or cord impingement. Foramina remain patent.

T6-7: Small right paracentral disc protrusion mildly flattens the
right ventral thecal sac. No stenosis or cord impingement. Foramina
remain patent.

T7-8: Left paracentral disc protrusion flattens and partially
effaces the left ventral thecal sac. Mild flattening of the left
ventral cord without cord signal changes or significant spinal
stenosis. Foramina remain patent.

T8-9: Left paracentral disc protrusion indents the left ventral
thecal sac. Mild cord flattening without cord signal changes. No
spinal stenosis. Foramina remain patent.

T9-10: Unremarkable.

T10-11: Mild disc bulge. Posterior element hypertrophy. No
significant spinal stenosis. Foramina remain patent.

T11-12:  Minimal disc bulge.  No stenosis.

T12-L1: Small right central disc protrusion indents the right
ventral thecal sac. No significant spinal stenosis. Foramina remain
patent.

MRI LUMBAR SPINE FINDINGS

Segmentation: Transitional lumbosacral anatomy with sacralization of
the L5 vertebral body. Vertebral body count made from the dens.
L5-S1 interspace is rudimentary.

Alignment: Trace anterolisthesis of L3 on L4, with 3 mm
anterolisthesis of L4 on L5. Findings chronic and facet mediated.
Alignment otherwise normal with preservation of the normal lumbar
lordosis.

Vertebrae: Vertebral body height maintained without acute or chronic
fracture. Bone marrow signal intensity within normal limits. No
worrisome osseous lesions. No abnormal marrow edema or enhancement.

Conus medullaris: Extends to the L1 level and appears normal. No
abnormal enhancement.

Paraspinal and other soft tissues: Paraspinous soft tissues within
normal limits. Multiple scattered benign appearing cyst noted about
the visualized kidneys. Visualized visceral structures otherwise
unremarkable.

Disc levels:

L1-2: Disc desiccation without disc bulge. Mild facet hypertrophy.
No canal or foraminal stenosis.

L2-3: Degenerative intervertebral disc space narrowing with diffuse
disc bulge and disc desiccation. Associated reactive endplate change
with marginal endplate osteophytic spurring. Changes more pronounced
on the right. Moderate facet hypertrophy. Resultant mild spinal
stenosis. Moderate right L2 foraminal narrowing. Left neural
foramina remains patent.

L3-4: Trace anterolisthesis. Disc bulge with disc desiccation.
Superimposed left foraminal disc protrusion impinges upon the
exiting left L3 nerve root (series 31, image 13). Moderate facet and
ligament flavum hypertrophy with associated small joint effusions.
Resultant moderate spinal stenosis. Mild right with moderate left L3
foraminal narrowing.

L4-5: Anterolisthesis. Disc desiccation with mild disc bulge. Severe
facet arthrosis. No canal or lateral recess narrowing. Foramina
remain patent.

L5-S1: Transitional lumbosacral anatomy with rudimentary L5-S1
interspace. No disc bulge or focal disc herniation. No significant
stenosis.
IMPRESSION: MRI THORACIC SPINE IMPRESSION:

1. No acute abnormality within the thoracic spine, with normal MRI
appearance of the thoracic spinal cord. No evidence for subacute
combined degeneration. No other cord signal changes to suggest
myelopathy.
2. Multilevel degenerative spondylosis with small disc protrusions
at T5-6 through T8-9 as above. No significant spinal stenosis.

MRI LUMBAR SPINE IMPRESSION:

1. Normal MRI appearance of the conus medullaris and nerve roots of
the cauda equina.
2. Multifactorial degenerative changes at L3-4 with resultant
moderate spinal stenosis. Superimposed left foraminal disc
protrusion impinges on the left L3 nerve root.
3. Degenerative disc disease and facet hypertrophy at L2-3 with
resultant mild canal with moderate right L2 foraminal stenosis.
4. Transitional lumbosacral anatomy with sacralization of the L5
vertebral body.

## 2021-01-08 IMAGING — CR DG HIP (WITH OR WITHOUT PELVIS) 2-3V*L*
3 series · 3 of 3 positions shown · non-contrast
Comparison: None

CLINICAL DATA: Multiple falls, LEFT hip and LEFT shoulder pain

EXAM:
DG HIP (WITH OR WITHOUT PELVIS) 2-3V LEFT

[pelvis ap]
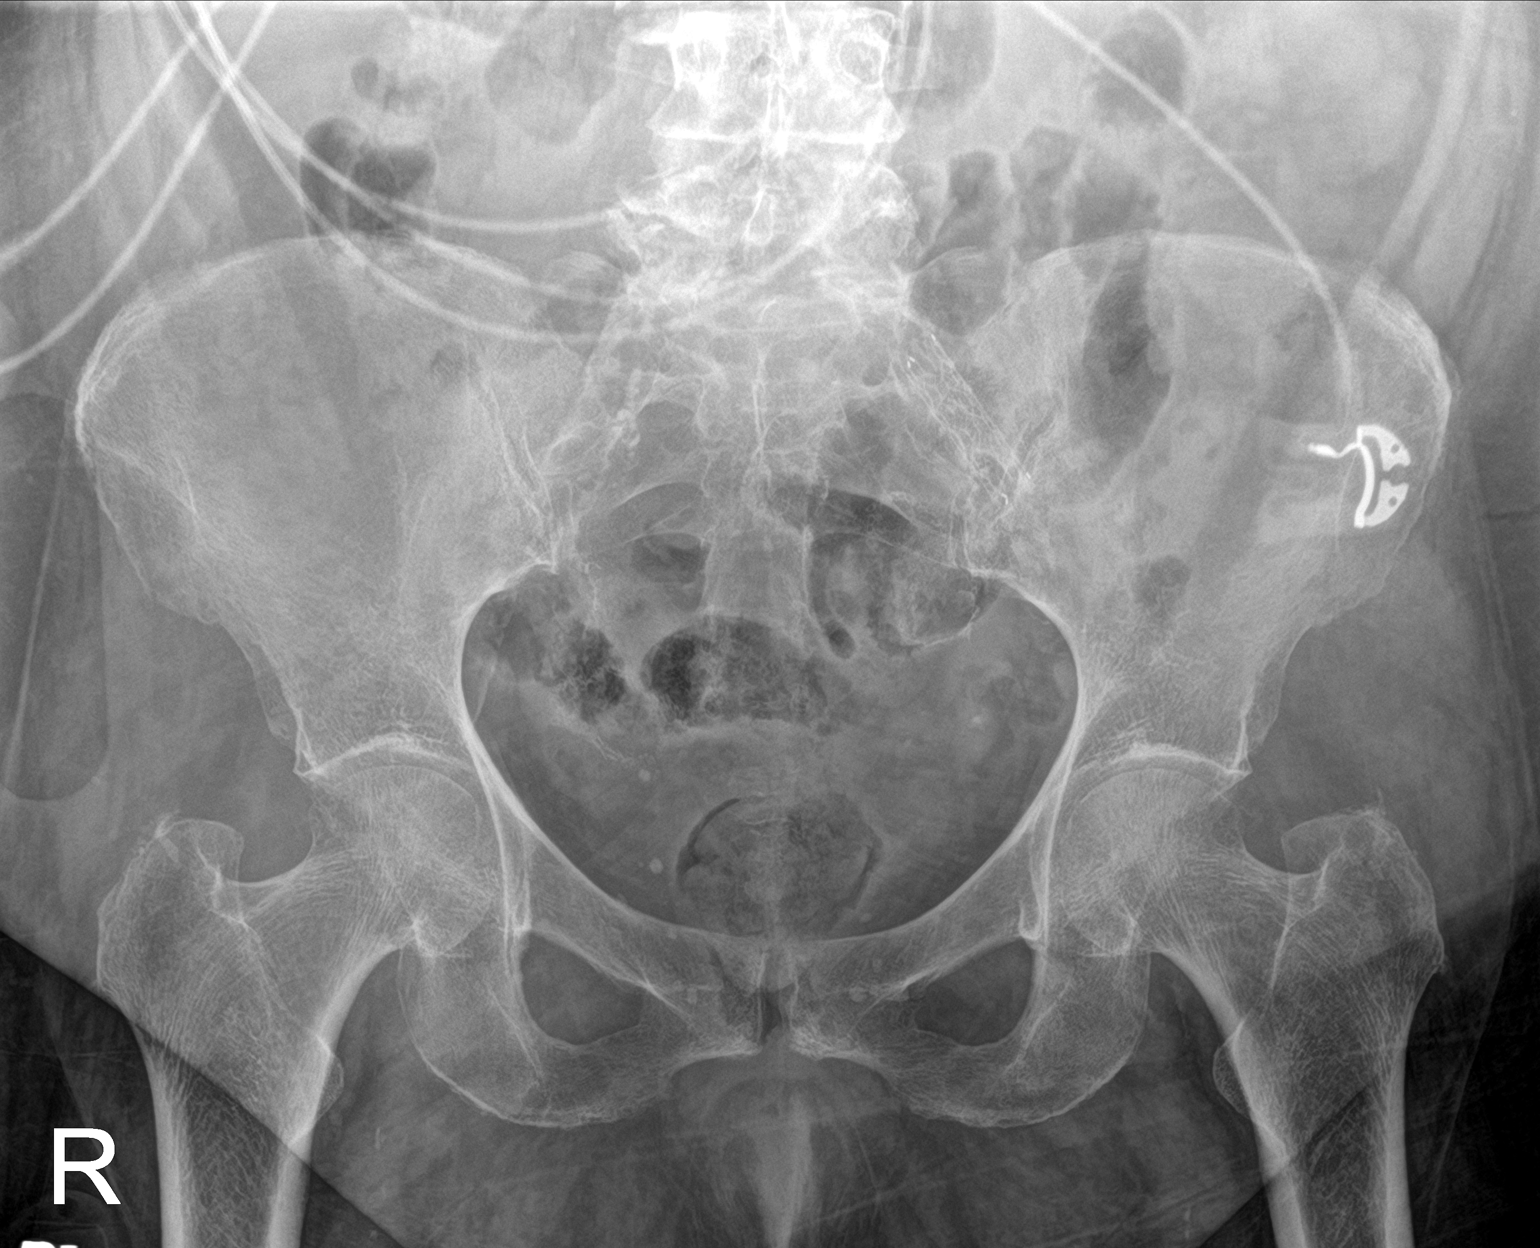

[hip ap]
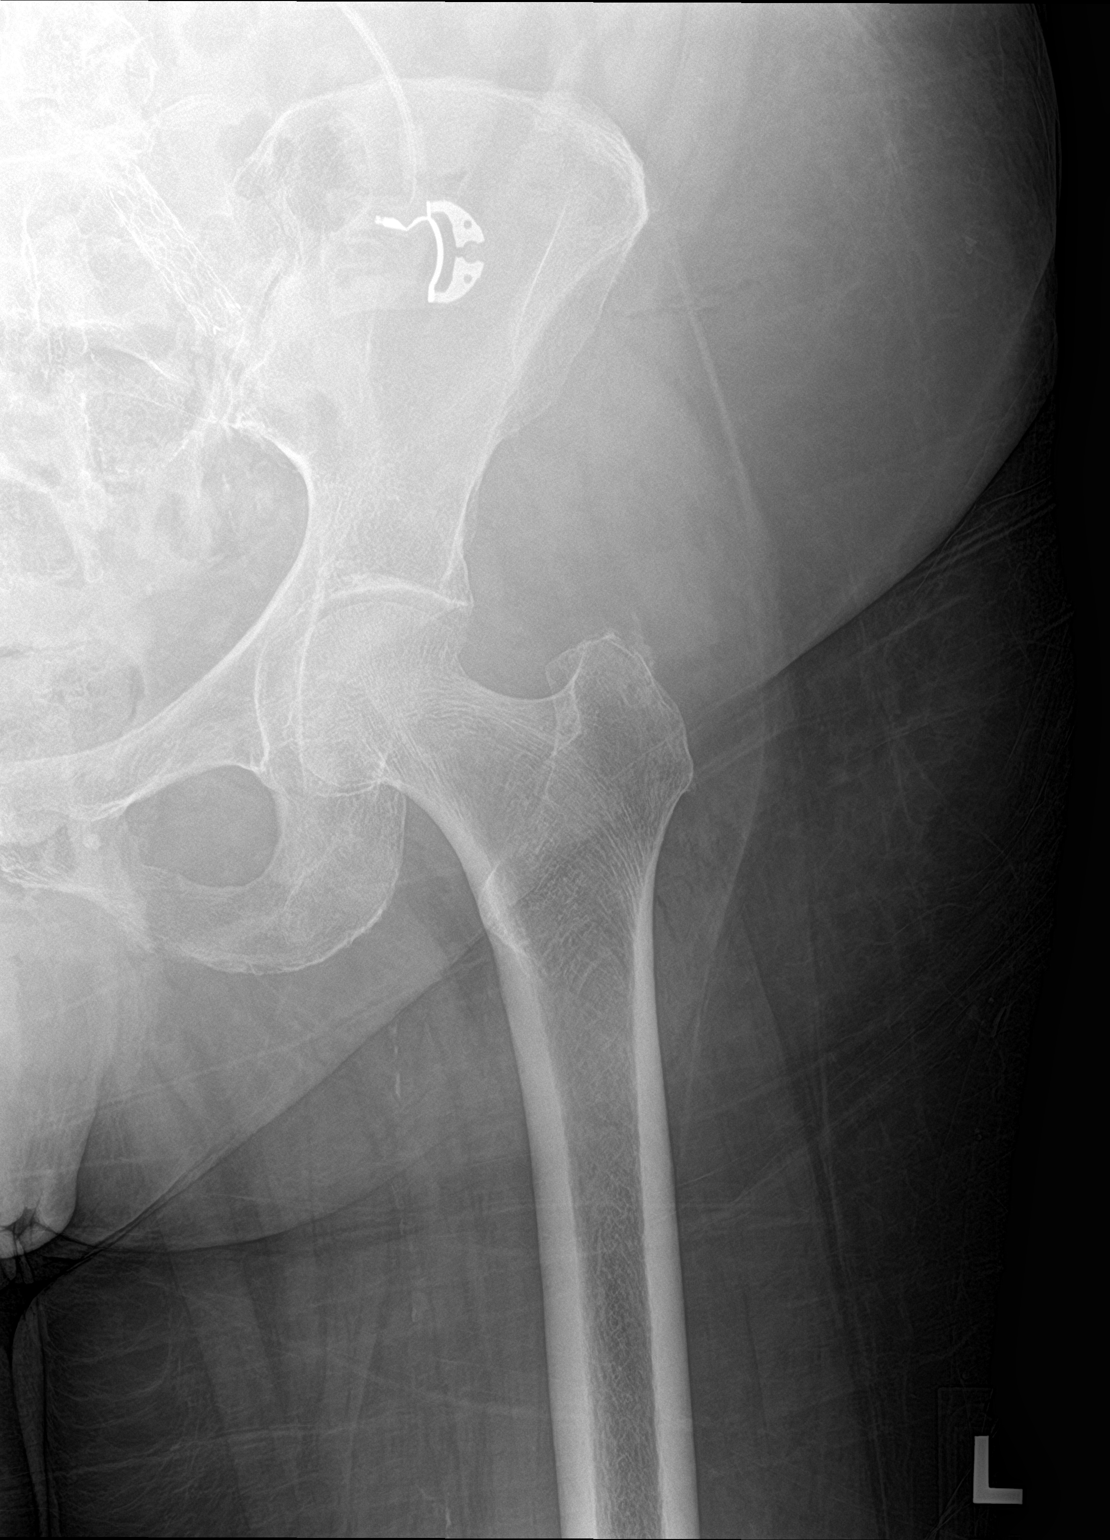

[hip lat]
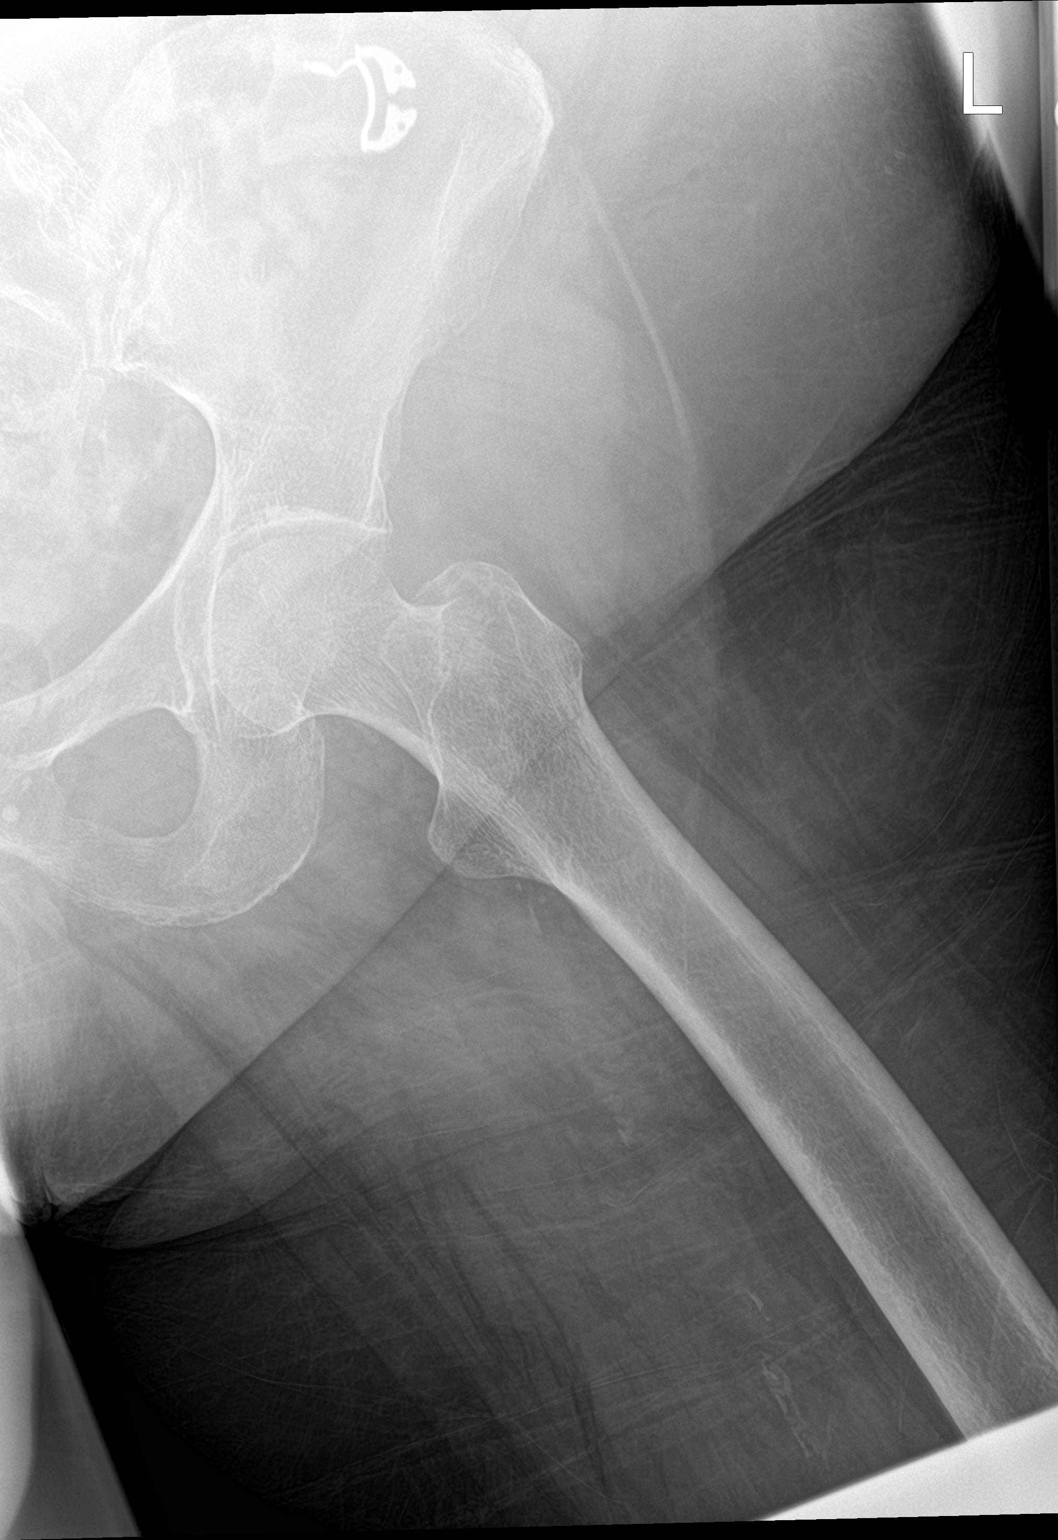

[3 of 3 positions shown; findings below may reference images not displayed]

FINDINGS: Osseous demineralization.

Hip and SI joint spaces preserved.

No acute fracture, dislocation, or bone destruction.

Vascular stent at LEFT common iliac artery.

Scattered atherosclerotic calcifications.

Facet degenerative changes lower lumbar spine.
IMPRESSION: No acute osseous abnormalities.

## 2021-01-08 IMAGING — MR MR THORACIC SPINE WO/W CM
6 of 9 series · 27 of 48 positions shown · IV contrast (gadavist)
Comparison: Prior radiograph from [DATE].

CLINICAL DATA: Initial evaluation for acute bilateral lower
extremity weakness, myelopathy, frequent falls. B12 deficiency.

EXAM:
MRI THORACIC AND LUMBAR SPINE WITHOUT AND WITH CONTRAST
TECHNIQUE: Multiplanar and multiecho pulse sequences of the thoracic and lumbar
spine were obtained without and with intravenous contrast.
CONTRAST:  7mL GADAVIST GADOBUTROL 1 MMOL/ML IV SOLN

[Series 16: T1 · sagittal · 5.0mm · 1.88mm/px · 1 of 9 slices shown (1 of 3)]
[im 1/9]
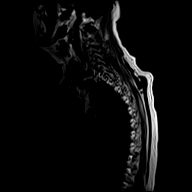

[Series 17: T2 · sagittal · 3.0mm · 1.06mm/px · 3 of 19 slices shown (1 of 2)]
[im 1/19]
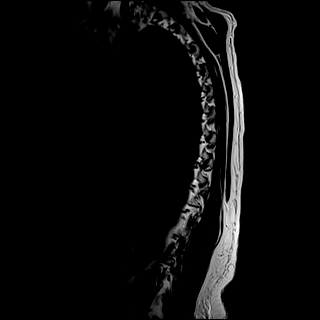
[im 10/19]
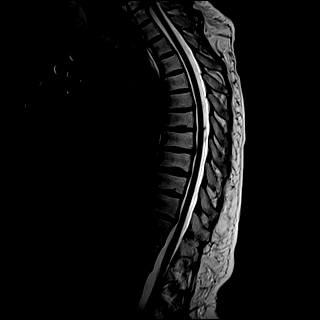
[im 19/19]
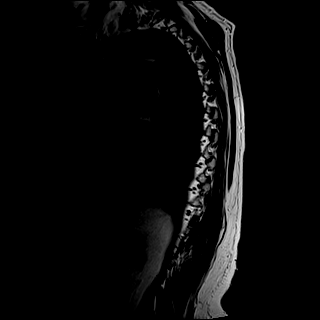

[Series 18: T1 · sagittal · 3.0mm · 1.06mm/px · 4 of 19 slices shown (2 of 3)]
[im 1/19]
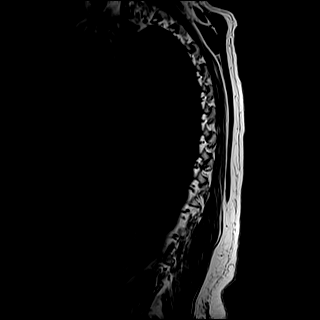
[im 7/19]
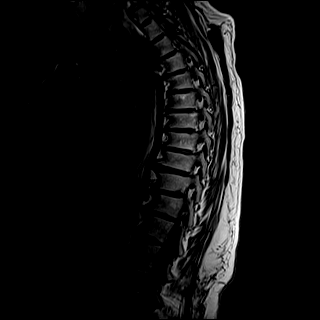
[im 13/19]
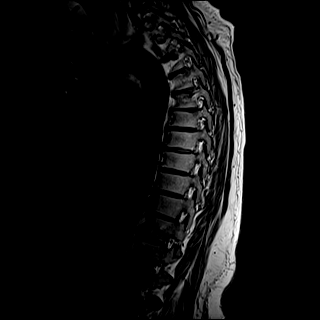
[im 19/19]
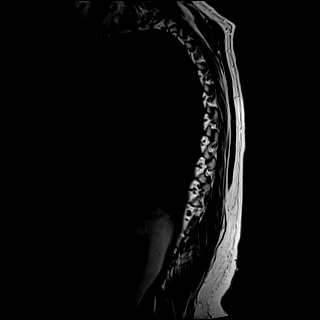

[Series 20: T2 · axial · 4.0mm · 0.59mm/px · z∈[-343,-143]mm · 8 of 39 slices shown (2 of 2)]
[im 1/39]
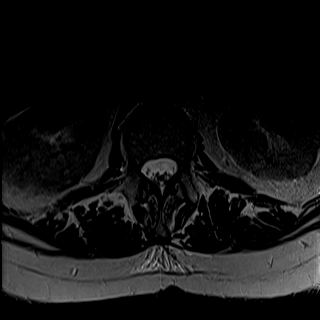
[im 6/39]
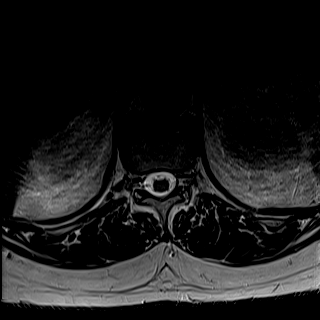
[im 11/39]
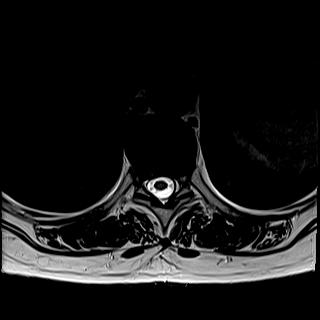
[im 17/39]
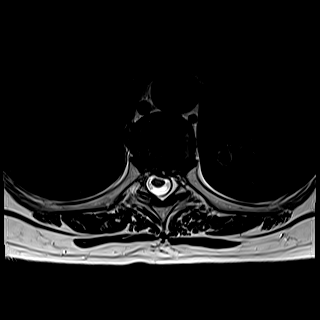
[im 22/39]
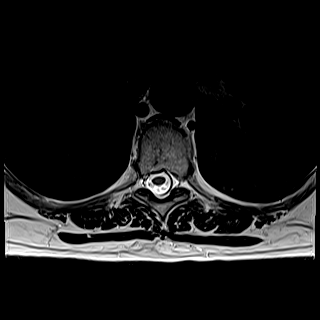
[im 28/39]
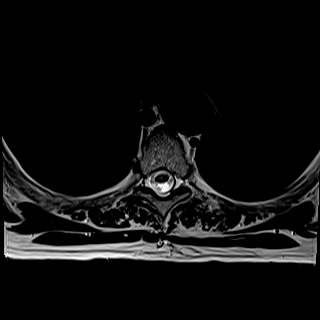
[im 33/39]
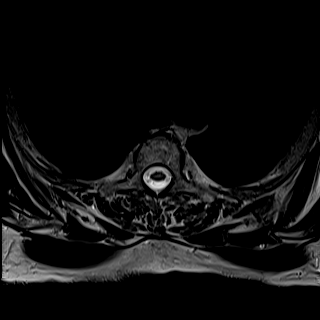
[im 39/39]
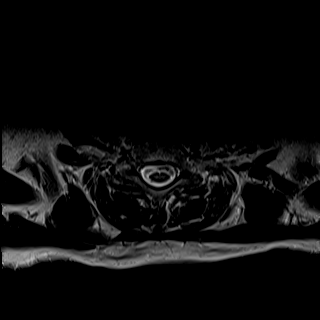

[Series 22: T1 · axial · non-contrast · 4.0mm · 0.37mm/px · z∈[-343,-143]mm · 8 of 39 slices shown (3 of 3)]
[im 1/39]
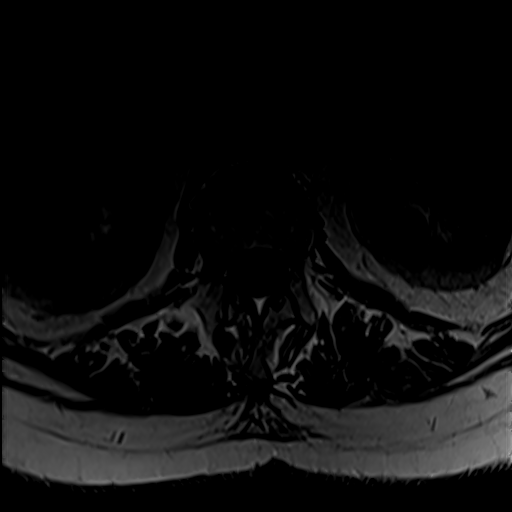
[im 6/39]
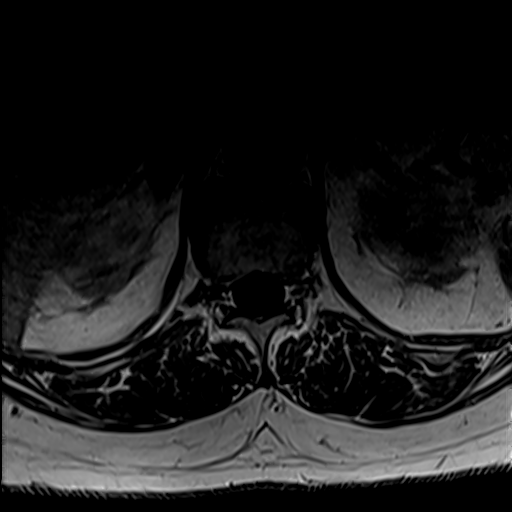
[im 11/39]
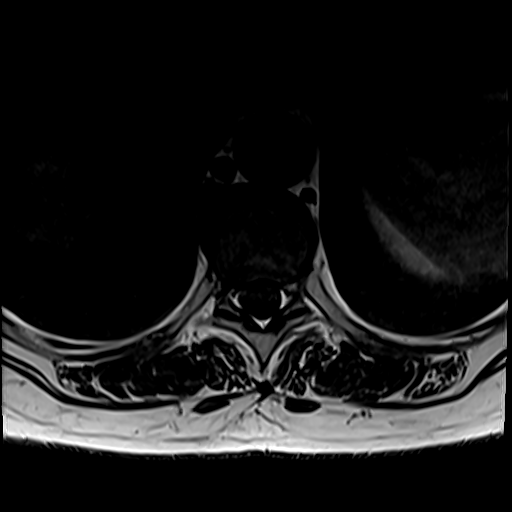
[im 17/39]
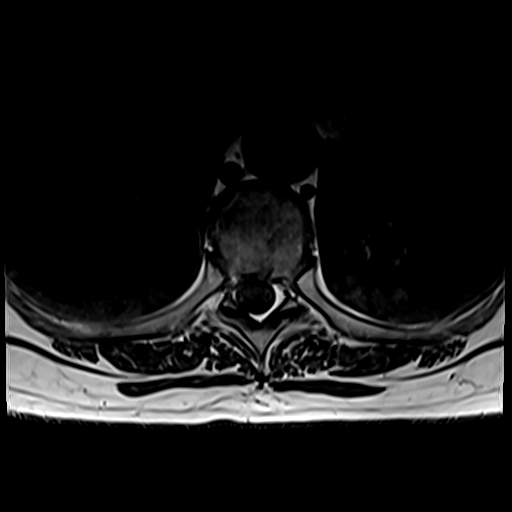
[im 22/39]
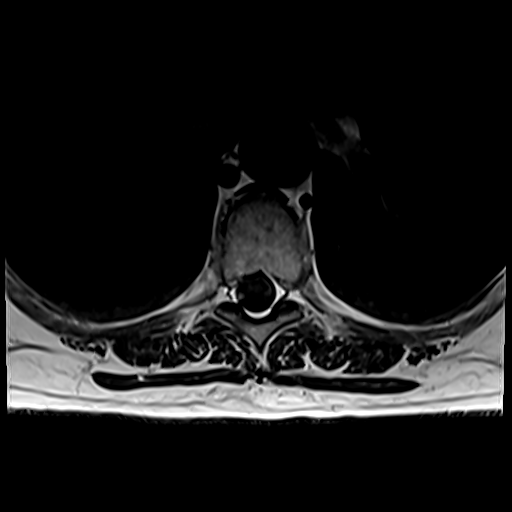
[im 28/39]
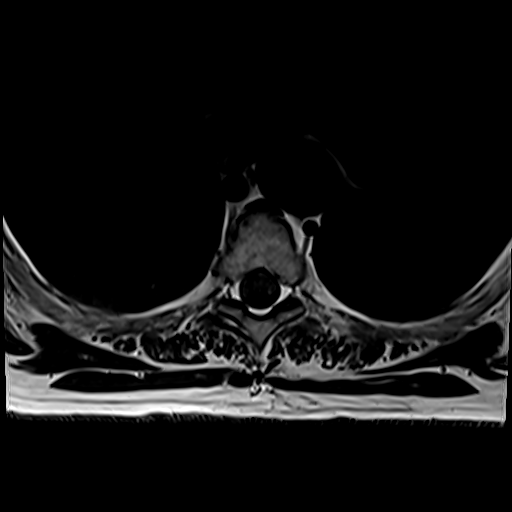
[im 33/39]
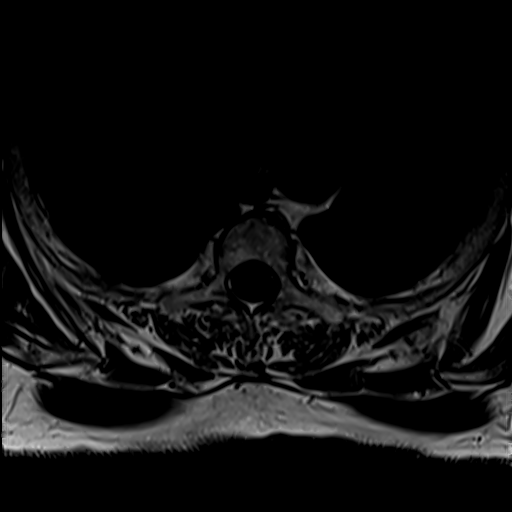
[im 39/39]
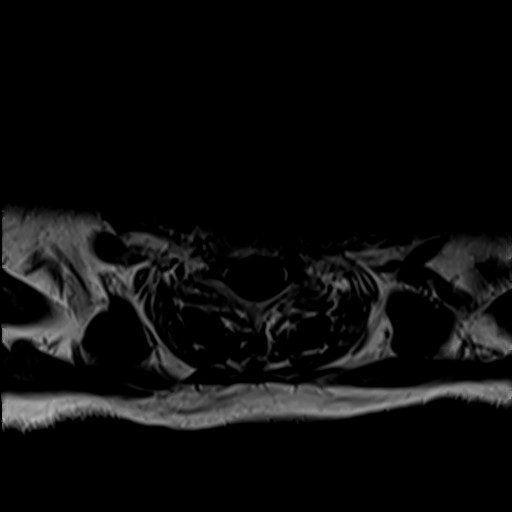

[Series 39: T1 fat-sat post-contrast · sagittal · 3.0mm · 1.06mm/px · 3 of 19 slices shown]
[im 1/19]
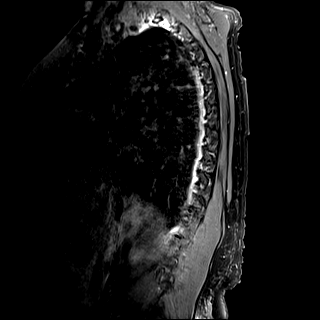
[im 7/19]
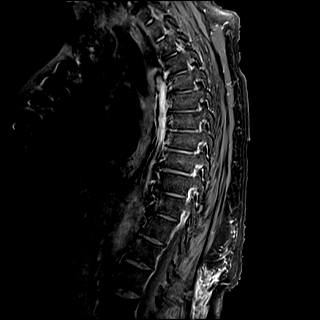
[im 13/19]
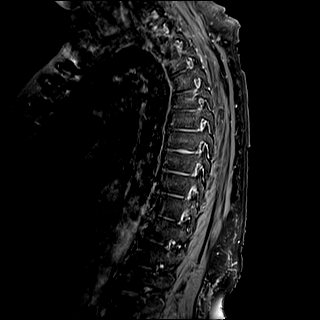

[27 of 48 positions shown; findings below may reference images not displayed]

FINDINGS: MRI THORACIC SPINE FINDINGS

Alignment: Physiologic with preservation of the normal thoracic
kyphosis. No significant listhesis.

Vertebrae: Vertebral body height maintained without acute or chronic
fracture. Bone marrow signal intensity within normal limits.
Subcentimeter benign hemangioma noted within the T11 vertebral body.
No worrisome osseous lesions. Mild discogenic reactive endplate
change noted about the partially visualized C6-7 interspace. No
other abnormal marrow edema or enhancement.

Cord: Normal signal and morphology. No signal changes to suggest
subacute combined degeneration. No abnormal enhancement. Normal cord
caliber and morphology.

Paraspinal and other soft tissues: Paraspinous soft tissues within
normal limits. Few small benign appearing cyst noted within the
visualized kidneys. Visualized visceral structures otherwise
unremarkable.

Disc levels:

T1-2:  Unremarkable.

T2-3: Unremarkable.

T3-4:  Unremarkable.

T4-5:  Unremarkable.

T5-6: Small right central disc protrusion mildly indents the ventral
thecal sac. No stenosis or cord impingement. Foramina remain patent.

T6-7: Small right paracentral disc protrusion mildly flattens the
right ventral thecal sac. No stenosis or cord impingement. Foramina
remain patent.

T7-8: Left paracentral disc protrusion flattens and partially
effaces the left ventral thecal sac. Mild flattening of the left
ventral cord without cord signal changes or significant spinal
stenosis. Foramina remain patent.

T8-9: Left paracentral disc protrusion indents the left ventral
thecal sac. Mild cord flattening without cord signal changes. No
spinal stenosis. Foramina remain patent.

T9-10: Unremarkable.

T10-11: Mild disc bulge. Posterior element hypertrophy. No
significant spinal stenosis. Foramina remain patent.

T11-12:  Minimal disc bulge.  No stenosis.

T12-L1: Small right central disc protrusion indents the right
ventral thecal sac. No significant spinal stenosis. Foramina remain
patent.

MRI LUMBAR SPINE FINDINGS

Segmentation: Transitional lumbosacral anatomy with sacralization of
the L5 vertebral body. Vertebral body count made from the dens.
L5-S1 interspace is rudimentary.

Alignment: Trace anterolisthesis of L3 on L4, with 3 mm
anterolisthesis of L4 on L5. Findings chronic and facet mediated.
Alignment otherwise normal with preservation of the normal lumbar
lordosis.

Vertebrae: Vertebral body height maintained without acute or chronic
fracture. Bone marrow signal intensity within normal limits. No
worrisome osseous lesions. No abnormal marrow edema or enhancement.

Conus medullaris: Extends to the L1 level and appears normal. No
abnormal enhancement.

Paraspinal and other soft tissues: Paraspinous soft tissues within
normal limits. Multiple scattered benign appearing cyst noted about
the visualized kidneys. Visualized visceral structures otherwise
unremarkable.

Disc levels:

L1-2: Disc desiccation without disc bulge. Mild facet hypertrophy.
No canal or foraminal stenosis.

L2-3: Degenerative intervertebral disc space narrowing with diffuse
disc bulge and disc desiccation. Associated reactive endplate change
with marginal endplate osteophytic spurring. Changes more pronounced
on the right. Moderate facet hypertrophy. Resultant mild spinal
stenosis. Moderate right L2 foraminal narrowing. Left neural
foramina remains patent.

L3-4: Trace anterolisthesis. Disc bulge with disc desiccation.
Superimposed left foraminal disc protrusion impinges upon the
exiting left L3 nerve root (series 31, image 13). Moderate facet and
ligament flavum hypertrophy with associated small joint effusions.
Resultant moderate spinal stenosis. Mild right with moderate left L3
foraminal narrowing.

L4-5: Anterolisthesis. Disc desiccation with mild disc bulge. Severe
facet arthrosis. No canal or lateral recess narrowing. Foramina
remain patent.

L5-S1: Transitional lumbosacral anatomy with rudimentary L5-S1
interspace. No disc bulge or focal disc herniation. No significant
stenosis.
IMPRESSION: MRI THORACIC SPINE IMPRESSION:

1. No acute abnormality within the thoracic spine, with normal MRI
appearance of the thoracic spinal cord. No evidence for subacute
combined degeneration. No other cord signal changes to suggest
myelopathy.
2. Multilevel degenerative spondylosis with small disc protrusions
at T5-6 through T8-9 as above. No significant spinal stenosis.

MRI LUMBAR SPINE IMPRESSION:

1. Normal MRI appearance of the conus medullaris and nerve roots of
the cauda equina.
2. Multifactorial degenerative changes at L3-4 with resultant
moderate spinal stenosis. Superimposed left foraminal disc
protrusion impinges on the left L3 nerve root.
3. Degenerative disc disease and facet hypertrophy at L2-3 with
resultant mild canal with moderate right L2 foraminal stenosis.
4. Transitional lumbosacral anatomy with sacralization of the L5
vertebral body.

## 2021-01-08 IMAGING — CR DG SHOULDER 2+V*L*
3 series · 3 of 3 positions shown · non-contrast
Comparison: None

CLINICAL DATA: Falls, LEFT hip and LEFT shoulder pain

EXAM:
LEFT SHOULDER - 2+ VIEW

[shoulder grashey (1 of 2)]
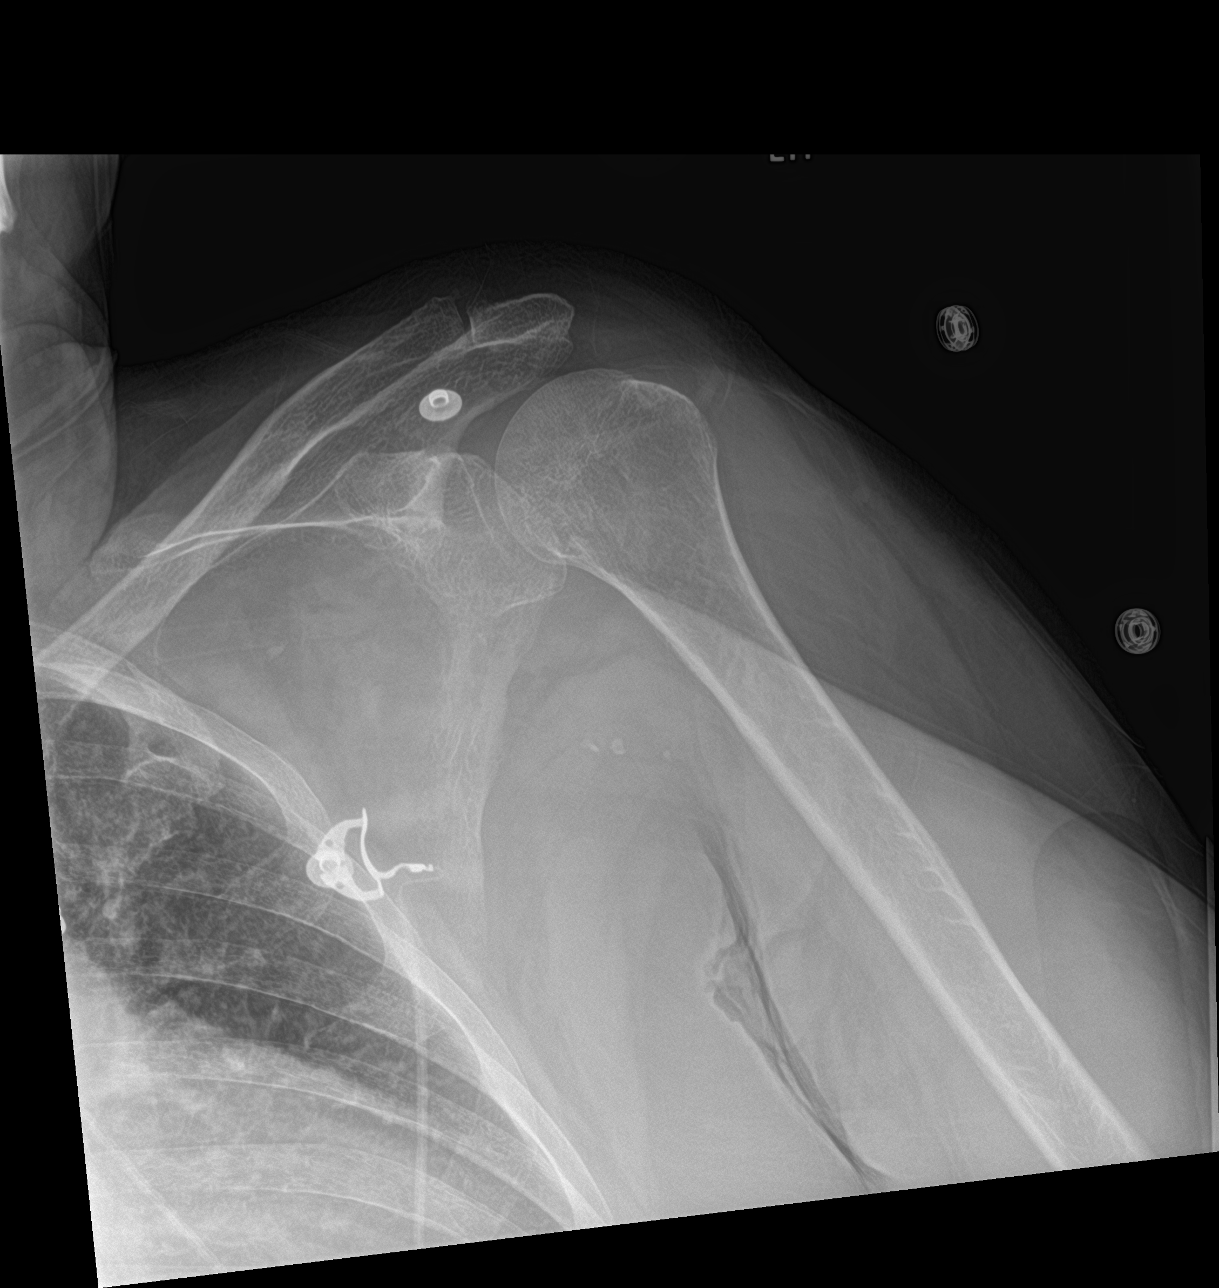

[shoulder y view]
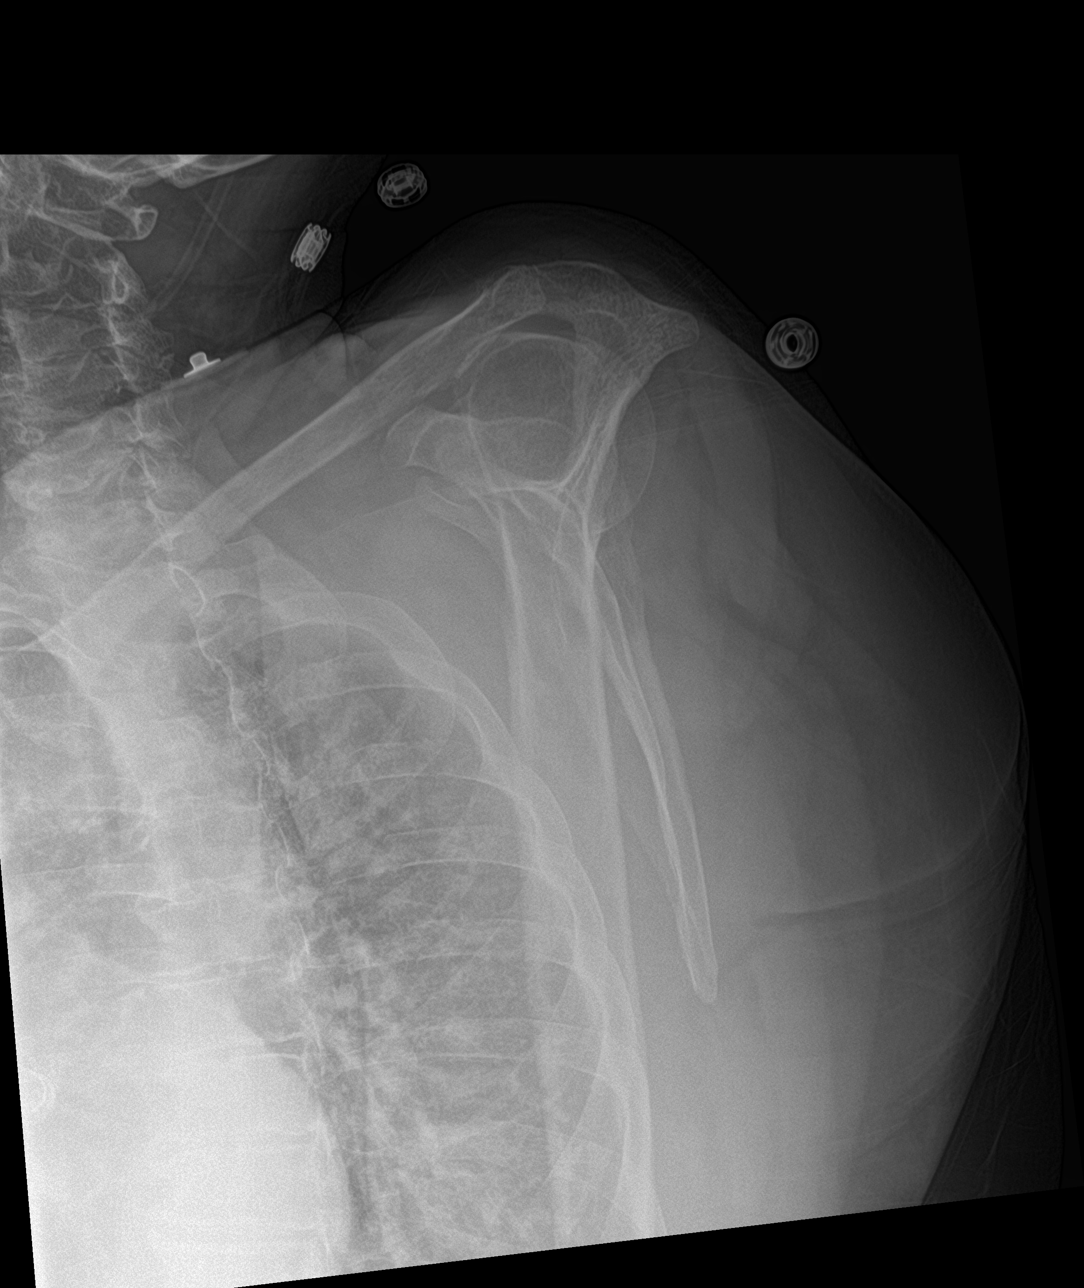

[shoulder grashey (2 of 2)]
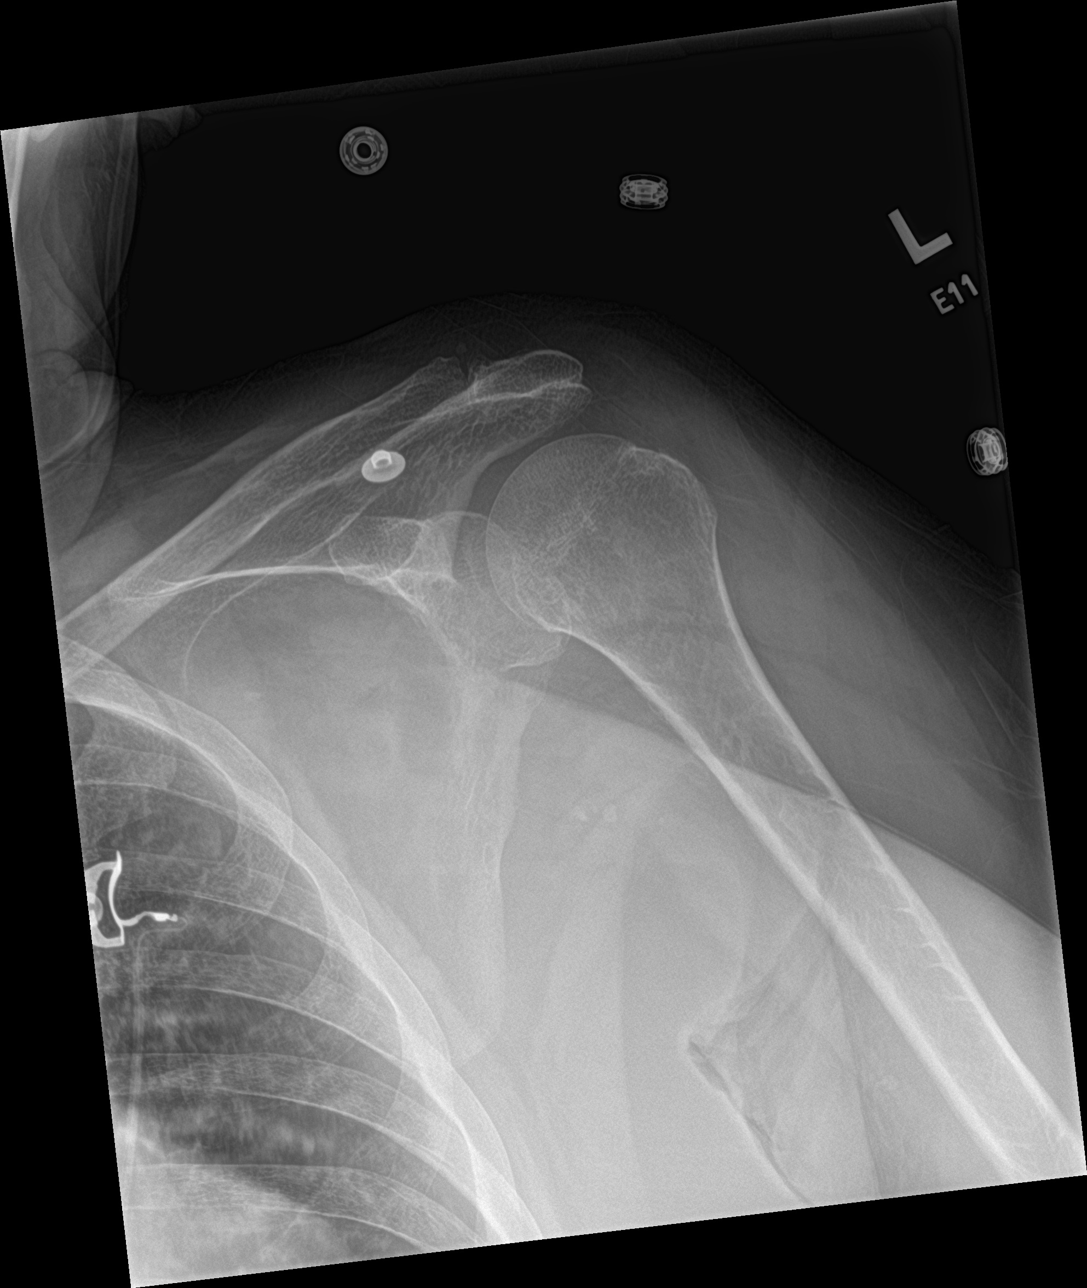

[3 of 3 positions shown; findings below may reference images not displayed]

FINDINGS: Osseous demineralization.

AC joint alignment normal.

Visualized LEFT ribs intact.

No glenohumeral fracture, dislocation, or bone destruction.
IMPRESSION: No acute osseous abnormalities.

## 2021-01-08 MED ORDER — TRAZODONE HCL 50 MG PO TABS
150.0000 mg | ORAL_TABLET | Freq: Every evening | ORAL | Status: DC | PRN
  Administered 2021-01-09 – 2021-01-11 (×2): 150 mg via ORAL
  Filled 2021-01-08 (×2): qty 3

## 2021-01-08 MED ORDER — FAMOTIDINE 20 MG PO TABS
20.0000 mg | ORAL_TABLET | Freq: Every day | ORAL | Status: DC
  Administered 2021-01-08: 20 mg via ORAL
  Filled 2021-01-08: qty 1

## 2021-01-08 MED ORDER — PREGABALIN 75 MG PO CAPS
75.0000 mg | ORAL_CAPSULE | Freq: Two times a day (BID) | ORAL | Status: DC
  Administered 2021-01-08 – 2021-01-12 (×8): 75 mg via ORAL
  Filled 2021-01-08 (×8): qty 1

## 2021-01-08 MED ORDER — OXYCODONE-ACETAMINOPHEN 5-325 MG PO TABS
1.0000 | ORAL_TABLET | Freq: Four times a day (QID) | ORAL | Status: AC | PRN
  Administered 2021-01-08: 1 via ORAL
  Filled 2021-01-08: qty 1

## 2021-01-08 MED ORDER — PANTOPRAZOLE SODIUM 40 MG PO TBEC
40.0000 mg | DELAYED_RELEASE_TABLET | Freq: Every day | ORAL | Status: DC
  Administered 2021-01-08 – 2021-01-12 (×5): 40 mg via ORAL
  Filled 2021-01-08 (×5): qty 1

## 2021-01-08 MED ORDER — TRAZODONE HCL 100 MG PO TABS: 100.0000 mg | ORAL_TABLET | Freq: Every day | ORAL | Status: DC

## 2021-01-08 MED ORDER — GADOBUTROL 1 MMOL/ML IV SOLN
7.0000 mL | Freq: Once | INTRAVENOUS | Status: AC | PRN
  Administered 2021-01-08: 7 mL via INTRAVENOUS

## 2021-01-08 MED ORDER — CYANOCOBALAMIN 1000 MCG/ML IJ SOLN
1000.0000 ug | Freq: Every day | INTRAMUSCULAR | Status: DC
  Administered 2021-01-08 – 2021-01-12 (×5): 1000 ug via SUBCUTANEOUS
  Filled 2021-01-08 (×5): qty 1

## 2021-01-08 MED ORDER — SODIUM CHLORIDE 0.9% FLUSH
3.0000 mL | Freq: Two times a day (BID) | INTRAVENOUS | Status: DC
  Administered 2021-01-08 – 2021-01-12 (×7): 3 mL via INTRAVENOUS

## 2021-01-08 NOTE — ED Notes (Signed)
Dr Sedalia Muta notified of pt request for percocet that she takes at home for chronic pain management. No new orders given at this time.

## 2021-01-08 NOTE — Progress Notes (Signed)
NIF performed with patient x3. Patient able to obtain -14 to -16 cm. Efforts were subjective to coordination difficulty. Good volumes and techniques noted with incentive use.

## 2021-01-08 NOTE — ED Notes (Signed)
Pt being transported to 259 at this time

## 2021-01-08 NOTE — Evaluation (Signed)
Physical Therapy Evaluation Patient Details Name: Tara [name] MRN: [id number] DOB: June 17, 1946 Today's Date: 01/08/2021   History of Present Illness  Tara [name] is a 75 y.o. 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     She reports sudden onset of bilateral leg weakness and feeling like her legs are lead heavy.   These symptoms started at approximately 3 AM on day of presentation.  She was not able to return to sleep after this and did not have any p.o. intake after this.  She also experienced difficulty ambulating at this time.  Clinical Impression  Patient received in bed, OT present. She requires min guard for supine to sit. Transfers require mod +2 for sit to stand and to obtain balance. Posterior lean. Patient able to take a few steps from bed to bsc and back to bed. Requires increased time and assistance to cue and move walker. Patient has a difficult time picking up and moving feet. She will continue to benefit from skilled PT while here to improve functional independence and mobility for safe return home.     Follow Up Recommendations SNF    Equipment Recommendations  None recommended by PT    Recommendations for Other Services       Precautions / Restrictions Precautions Precautions: Fall Restrictions Weight Bearing Restrictions: No      Mobility  Bed Mobility Overal bed mobility: Needs Assistance Bed Mobility: Supine to Sit;Sit to Supine     Supine to sit: Min guard Sit to supine: Min assist   General bed mobility comments: requires assist to bring LEs back onto bed.    Transfers Overall  transfer level: Needs assistance Equipment used: Rolling walker (2 wheeled) Transfers: Sit to/from Stand Sit to Stand: Min assist;+2 physical assistance;Mod assist         General transfer comment: posterior lean with initial standing  Ambulation/Gait Ambulation/Gait assistance: Mod assist;+2 physical assistance Gait Distance (Feet): 2 Feet   Gait Pattern/deviations: Step-to pattern;Decreased step length - right;Decreased step length - left Gait velocity: decreased   General Gait Details: requires mod +2 assist with taking a few pivoting steps to bsc and back to bed. Assist for balance and to move walker. Increased time needed  Stairs            Wheelchair Mobility    Modified Rankin (Stroke Patients Only)       Balance Overall balance assessment: Needs assistance Sitting-balance support: Feet supported Sitting balance-Leahy Scale: Good     Standing balance support: Bilateral upper extremity supported;During functional activity Standing balance-Leahy Scale: Poor Standing balance comment: Requires assistance for standing balance and dynamic standing activities. B UE assist.                             Pertinent Vitals/Pain Pain Assessment: No/denies pain    Home Living Family/patient expects to be discharged to:: Private residence Living Arrangements: Spouse/significant other;Children Available Help at Discharge: Family Type of Home: House Home Access: Stairs to enter   Secretary/administrator of Steps: 4 Home Layout: Two level;Able to live on main level with bedroom/bathroom Home Equipment: Dan Humphreys - 2 wheels Additional Comments: Patient has husband and daughter to assist. Legally blind  Prior Function Level of Independence: Needs assistance   Gait / Transfers Assistance Needed: able to ambulate short distances at home with walker  ADL's / Homemaking Assistance Needed: requires assistance with cooking, cleaning, bathing, patient legally  blind        Hand Dominance        Extremity/Trunk Assessment   Upper Extremity Assessment Upper Extremity Assessment: Generalized weakness    Lower Extremity Assessment Lower Extremity Assessment: RLE deficits/detail;LLE deficits/detail RLE Coordination: decreased gross motor LLE Coordination: decreased gross motor    Cervical / Trunk Assessment Cervical / Trunk Assessment: Normal  Communication   Communication: No difficulties  Cognition Arousal/Alertness: Awake/alert Behavior During Therapy: WFL for tasks assessed/performed Overall Cognitive Status: Within Functional Limits for tasks assessed                                        General Comments      Exercises     Assessment/Plan    PT Assessment Patient needs continued PT services  PT Problem List Decreased strength;Decreased mobility;Decreased activity tolerance;Decreased balance       PT Treatment Interventions Therapeutic exercise;Gait training;Functional mobility training;Therapeutic activities;Patient/family education;Balance training;DME instruction    PT Goals (Current goals can be found in the Care Plan section)  Acute Rehab PT Goals Patient Stated Goal: patient does not want to go to rehab, wants to go home PT Goal Formulation: With patient Time For Goal Achievement: 01/22/21 Potential to Achieve Goals: Fair    Frequency Min 2X/week   Barriers to discharge Decreased caregiver support      Co-evaluation               AM-PAC PT "6 Clicks" Mobility  Outcome Measure Help needed turning from your back to your side while in a flat bed without using bedrails?: A Little Help needed moving from lying on your back to sitting on the side of a flat bed without using bedrails?: A Little Help needed moving to and from a bed to a chair (including a wheelchair)?: A Lot Help needed standing up from a chair using your arms (e.g., wheelchair or bedside chair)?: A Lot Help needed to  walk in hospital room?: Total Help needed climbing 3-5 steps with a railing? : Total 6 Click Score: 12    End of Session   Activity Tolerance: Patient limited by fatigue Patient left: in bed;with call bell/phone within reach;with bed alarm set Nurse Communication: Mobility status PT Visit Diagnosis: Other abnormalities of gait and mobility (R26.89);Muscle weakness (generalized) (M62.81);Difficulty in walking, not elsewhere classified (R26.2)    Time: 3536-1443 PT Time Calculation (min) (ACUTE ONLY): 30 min   Charges:   PT Evaluation $PT Eval Moderate Complexity: 1 Mod          Elias Dennington, PT, GCS 01/08/21,12:24 PM

## 2021-01-08 NOTE — Progress Notes (Addendum)
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 Now presented with bilateral leg weakness and numbness since Friday. Also reports frequent falls due to losing balance.   Currently plan is further work up.  Assessment and Plan: 1. Bilateral lower extremity weakness Frequent falls B12 deficiency MRI of the brain with and without contrast was ordered by EDP and read as redemonstration of chronic cortical/subcortical infarcts within the bilateral frontal, parietal, occipital, left temporal lobes. -Subcentimeter focus of diffusion weighted hyperintensity within the left occipital brain infarction territories.  Secondary to chronic hemosiderin deposition or cortical laminar necrosis at the site, rather than superimposed acute infarct. B12 low, will replace with injection. Normal thyroid levels. B1 stable. Negative Bilateral lower extremity ultrasound for DVT. Neurology consulted recommended MRI spine L and T. They will follow up for formal consult tomorrow. Monitor on tele.   Addendum: Discussed with the daughter. Per daughter weakness in the legs is progressive for last few months. All symptoms started primarily with pain after her vascular surgery intervention and then she started having tremors which will cause her to have falls. The tremors progressed to the point that the patient become immobile and wheelchair-bound and caused more deconditioning. Patient has gone through multiple PT sessions and evaluations and therapy. Wellbutrin was recently started for smoking  cessation. Patient is on nortriptyline for neuropathy pain. Patient is on trazodone as needed only for sleep. Patient is on Celexa for depression. Lyrica was started by neurologist for neuropathy pain. Lamictal was started thinking that her tremors might have some association with seizure-like events. Currently neurology is weaning down Klonopin which was also started with the same concern of seizure-like events causing tremor.  Lynden Oxford 7:24 PM 01/08/2021    2. History of seizure activity follows with Dr. Malvin Johns,  on 01/01/2021 patient was scheduled for 3-hour EEG in the future Patient had an EEG on 12/03/2020 which showed within normal limits  3. Possible obstructive sleep apnea CPAP nightly ordered  4. Chronic knee and back pain Patient reports she does not currently take Percocets however requested Percocet Patient declines acetaminophen Due to concerns for respiratory decompensation, I have ordered naproxen  5. Tremors Lamictal 50 mg twice daily resumed, follow-up with outpatient neurologist  6. 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   Disposition:  Status is: Inpatient  Remains inpatient appropriate because:IV treatments appropriate due to intensity of illness or inability to take PO and Inpatient level of care appropriate due to severity of illness  Dispo: The patient is from: Home              Anticipated d/c is to: SNF              Patient currently is not medically  stable to d/c.   Difficult to place patient No  Subjective:  Continue to have weakness but feels better. No chest pain, no headache, no dizziness, no tingling numbness.   Physical Exam:  General: Appear in mild distress, no Rash; Oral Mucosa Clear, moist. no Abnormal Neck Mass Or lumps, Conjunctiva normal  Cardiovascular: S1 and S2 Present, no Murmur, Respiratory: good respiratory effort, Bilateral Air entry present and CTA, no Crackles, no wheezes Abdomen: Bowel Sound present, Soft and no tenderness Extremities: no Pedal edema Neurology: alert and oriented to time, place, and person affect appropriate. no new focal deficit Bilateral leg numbness and weakness, reflexes difficult to elicit.  Gait not checked due to patient safety concerns  Vitals:   01/08/21 0807 01/08/21 0813 01/08/21 1234 01/08/21 1734  BP: 123/64  (!) 117/57 (!) 144/53  Pulse: 65 63 66 67  Resp: 18  18 (!) 24  Temp: 97.9 F (36.6 C)  98.4 F (36.9 C) 97.6 F (36.4 C)  TempSrc:      SpO2: 91% 97% 94% 94%  Weight:      Height:       No intake or output data in the 24 hours ending 01/08/21 1815 Filed Weights   01/07/21 1343  Weight: 78 kg    Data Reviewed: I have personally reviewed and interpreted daily labs, tele strips, imaging. I reviewed all nursing notes, pharmacy notes, vitals, pertinent old records I have discussed plan of care as described above with RN and patient/family.  CBC: Recent Labs  Lab 01/07/21 1348 01/08/21 0602  WBC 6.5 5.0  HGB 13.1 12.9  HCT 39.8 39.9  MCV 85.6 84.2  PLT 166 162   Basic Metabolic Panel: Recent Labs  Lab 01/07/21 1348 01/08/21 0602  NA 140 142  K 3.5 3.9  CL 110 110  CO2 23 26  GLUCOSE 125* 125*  BUN 30* 31*  CREATININE 1.03* 1.13*  CALCIUM 9.3 9.1    Studies: US Venous Img Lower Bilateral (DVT)  Result Date: 01/07/2021 CLINICAL DATA:  Lower extremity swelling. EXAM: BILATERAL LOWER EXTREMITY VENOUS DOPPLER ULTRASOUND TECHNIQUE: Gray-scale sonography with compression, as well as color and duplex  ultrasound, were performed to evaluate the deep venous system(s) from the level of the common femoral vein through the popliteal and proximal calf veins. COMPARISON:  None. FINDINGS: VENOUS Normal compressibility of the common femoral, superficial femoral, and popliteal veins, as well as the visualized calf veins. Visualized portions of profunda femoral vein and great saphenous vein unremarkable. No filling defects to suggest DVT on grayscale or color Doppler imaging. Doppler waveforms show normal direction of venous flow, normal respiratory plasticity and response to augmentation. OTHER None. Limitations: none IMPRESSION: No evidence of bilateral lower extremity DVT. Electronically Signed   By: Narda Rutherford M.D.   On: 01/07/2021 21:35   DG Shoulder Left  Result Date: 01/08/2021 CLINICAL DATA:  Falls, LEFT hip and LEFT shoulder pain EXAM: LEFT SHOULDER - 2+ VIEW COMPARISON:  None FINDINGS: Osseous demineralization. AC joint alignment normal. Visualized LEFT ribs intact. No glenohumeral fracture, dislocation, or bone destruction. IMPRESSION: No acute osseous abnormalities. Electronically Signed   By: Ulyses Southward M.D.   On: 01/08/2021 16:46   DG HIP UNILAT WITH PELVIS 2-3 VIEWS LEFT  Result Date: 01/08/2021 CLINICAL DATA:  Multiple falls, LEFT hip and LEFT shoulder pain EXAM: DG HIP (WITH OR WITHOUT PELVIS) 2-3V LEFT COMPARISON:  None FINDINGS: Osseous demineralization. Hip and SI joint spaces preserved. No acute fracture, dislocation, or bone destruction.  Vascular stent at LEFT common iliac artery. Scattered atherosclerotic calcifications. Facet degenerative changes lower lumbar spine. IMPRESSION: No acute osseous abnormalities. Electronically Signed   By: Ulyses Southward M.D.   On: 01/08/2021 16:45    Scheduled Meds:  amLODipine  10 mg Oral Daily   aspirin EC  81 mg Oral Daily   buPROPion  150 mg Oral BID   citalopram  40 mg Oral Daily   clonazePAM  0.25 mg Oral Daily   clopidogrel  75 mg Oral Daily    cyanocobalamin  1,000 mcg Subcutaneous Daily   donepezil  5 mg Oral Daily   enoxaparin (LOVENOX) injection  40 mg Subcutaneous Q24H   famotidine  20 mg Oral QHS   lamoTRIgine  50 mg Oral BID   lisinopril  40 mg Oral Daily   montelukast  10 mg Oral QPM   nicotine  21 mg Transdermal Daily   nortriptyline  50 mg Oral QHS   pantoprazole  40 mg Oral Daily   pregabalin  75 mg Oral BID   rosuvastatin  40 mg Oral QHS   traZODone  100 mg Oral QHS   Continuous Infusions: PRN Meds: acetaminophen **OR** acetaminophen, fluticasone, naproxen, nitroGLYCERIN, ondansetron **OR** ondansetron (ZOFRAN) IV  Time spent: 35 minutes  Author: Lynden Oxford, MD [facility] 01/08/2021 6:15 PM  To reach On-call, see care teams to locate the attending and reach out via [email or web address]. Between 7PM-7AM, please contact night-coverage If you still have difficulty reaching the attending provider, please page the [facility] (Director on Call) for [facility] on amion for assistance.

## 2021-01-08 NOTE — Evaluation (Signed)
Occupational Therapy Evaluation Patient Details Name: Tara [name] MRN: [id number] DOB: 1945-11-15 Today's Date: 01/08/2021    History of Present Illness Tara [name] is a 75 y.o. 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     She reports sudden onset of bilateral leg weakness and feeling like her legs are lead heavy.   These symptoms started at approximately 3 AM on day of presentation.  She was not able to return to sleep after this and did not have any p.o. intake after this.  She also experienced difficulty ambulating at this time.   Clinical Impression   Tara [name] was seen for OT evaluation this date. Prior to hospital admission, pt was MOD I for mobility and ADLs, assist from daughter for IADLs and setup bathing. Pt lives with husband who is disabled in home c 3-4 STE, reports daughter available on weekdays. Pt presents to acute OT demonstrating impaired ADL performance and functional mobility 2/2 decreased activity tolerance, functional strength/ROM/balance dits, poor safety awareness, and decreased insight into deficits.   Pt currently requires MIN A don B socks at bed level. MOD A x2 + RW for BSC t/f. MAX A + RW for perihygiene in standing, +2 CGA for standing balance. Pt would benefit from skilled OT to address noted impairments and functional limitations (see below for any additional details) in order to maximize safety and independence while minimizing falls risk and caregiver burden. Upon hospital discharge, recommend STR to maximize pt safety and return to PLOF.     Follow Up Recommendations  SNF    Equipment  Recommendations  3 in 1 bedside commode    Recommendations for Other Services       Precautions / Restrictions Precautions Precautions: Fall Restrictions Weight Bearing Restrictions: No      Mobility Bed Mobility Overal bed mobility: Needs Assistance Bed Mobility: Supine to Sit;Sit to Supine     Supine to sit: Min assist Sit to supine: Min assist   General bed mobility comments: VCs to sequence and find bed rail    Transfers Overall transfer level: Needs assistance Equipment used: Rolling walker (2 wheeled) Transfers: Sit to/from Stand Sit to Stand: +2 physical assistance;Mod assist         General transfer comment: posterior lean with initial standing    Balance Overall balance assessment: Needs assistance Sitting-balance support: Feet supported Sitting balance-Leahy Scale: Good     Standing balance support: Bilateral upper extremity supported;During functional activity Standing balance-Leahy Scale: Poor Standing balance comment: Requires assistance for standing balance and dynamic standing activities                           ADL either performed or assessed with clinical judgement   ADL Overall ADL's : Needs assistance/impaired                                       General ADL Comments: MIN A don B socks at bed level. MOD A x2 + RW for BSC t/f. MAX A + RW for perihygiene in standing, +2 CGA for standing balance     Vision Baseline Vision/History:  Legally blind Patient Visual Report: No change from baseline              Pertinent Vitals/Pain Pain Assessment: No/denies pain     Hand Dominance Right   Extremity/Trunk Assessment Upper Extremity Assessment Upper Extremity Assessment: Generalized weakness   Lower Extremity Assessment Lower Extremity Assessment: Generalized weakness RLE Coordination: decreased gross motor LLE Coordination: decreased gross motor   Cervical / Trunk Assessment Cervical / Trunk  Assessment: Normal   Communication Communication Communication: No difficulties   Cognition Arousal/Alertness: Awake/alert Behavior During Therapy: WFL for tasks assessed/performed Overall Cognitive Status: Within Functional Limits for tasks assessed                                        Exercises Exercises: Other exercises Other Exercises Other Exercises: Pt educated re: OT role, DME recs, d/c recs, falls prevention, visual scanning strategies, call bell education Other Exercises: LBD, toileting, sup<>sit, sit<>stand, sitting/standing balance/tolerance, SPT   Shoulder Instructions      Home Living Family/patient expects to be discharged to:: Private residence Living Arrangements: Spouse/significant other;Children Available Help at Discharge: Family Type of Home: House Home Access: Stairs to enter Secretary/administrator of Steps: 4   Home Layout: Two level;Able to live on main level with bedroom/bathroom               Home Equipment: Dan Humphreys - 2 wheels   Additional Comments: Patient has husband 24/7 but states he is disabled, has daughter to assist c IADLs on weekdays. Legally blind      Prior Functioning/Environment Level of Independence: Needs assistance  Gait / Transfers Assistance Needed: able to ambulate short distances at home with walker ADL's / Homemaking Assistance Needed: requires assistance with cooking, cleaning, bathing, patient legally blind            OT Problem List: Decreased strength;Decreased activity tolerance;Decreased safety awareness;Decreased range of motion;Decreased knowledge of use of DME or AE      OT Treatment/Interventions: Self-care/ADL training;Therapeutic exercise;Energy conservation;DME and/or AE instruction;Therapeutic activities;Patient/family education;Balance training    OT Goals(Current goals can be found in the care plan section) Acute Rehab OT Goals Patient Stated Goal: patient does not want to go to  rehab, wants to go home OT Goal Formulation: With patient Time For Goal Achievement: 01/22/21 Potential to Achieve Goals: Good ADL Goals Pt Will Perform Grooming: with supervision;standing (c LRAD PRN) Pt Will Perform Lower Body Dressing: with min assist;sit to/from stand (c LRAD PRN) Pt Will Transfer to Toilet: with modified independence;stand pivot transfer;bedside commode (c LRAD PRN) Pt Will Perform Toileting - Clothing Manipulation and hygiene: with modified independence;sitting/lateral leans  OT Frequency: Min 2X/week   Barriers to D/C: Decreased caregiver support          Co-evaluation PT/OT/SLP Co-Evaluation/Treatment: Yes Reason for Co-Treatment: To address functional/ADL transfers PT goals addressed during session: Mobility/safety with mobility OT goals addressed during session: ADL's and self-care      AM-PAC OT "6 Clicks" Daily Activity     Outcome Measure Help from another person eating meals?: None Help from another person taking care of personal grooming?: A Little Help from another person toileting, which includes using toliet, bedpan, or urinal?: A Lot Help from another person bathing (including washing, rinsing, drying)?: A Lot Help from another person to put on and taking off regular upper body clothing?: A Little Help from another person to put on and  taking off regular lower body clothing?: A Lot 6 Click Score: 16   End of Session Equipment Utilized During Treatment: Rolling walker  Activity Tolerance: Patient tolerated treatment well Patient left: in bed;with call bell/phone within reach;with bed alarm set  OT Visit Diagnosis: Other abnormalities of gait and mobility (R26.89);Muscle weakness (generalized) (M62.81)                Time: 1110-1145 OT Time Calculation (min): 35 min Charges:  OT General Charges $OT Visit: 1 Visit OT Evaluation $OT Eval Low Complexity: 1 Low OT Treatments $Self Care/Home Management : 8-22 mins  Kathie Dike, M.S. OTR/L   01/08/21, 1:07 PM  ascom [phone]

## 2021-01-08 NOTE — Plan of Care (Signed)

## 2021-01-08 NOTE — Progress Notes (Signed)
Patient seen earlier in ed for bipap setup. Patient on cannula. Provider, Cox, DO at bedside. Attemped bipap with patient. Provider explained in detail purpose and need to treat high co2 level. Patient refused bipap. States did not want to wear mask on face. Equipment removed. Placed patient back on [place].

## 2021-01-08 NOTE — ED Notes (Signed)
Pt's RLE elevated on pillow for comfort. Pt repositioned in bed by 2 staff. Dr Sedalia Muta notified of pt request for aleve.

## 2021-01-09 DIAGNOSIS — R531 Weakness: Principal | ICD-10-CM

## 2021-01-09 LAB — COMPREHENSIVE METABOLIC PANEL
ALT: 16 U/L (ref 0–44)
AST: 22 U/L (ref 15–41)
Albumin: 3.8 g/dL (ref 3.5–5.0)
Alkaline Phosphatase: 52 U/L (ref 38–126)
Anion gap: 7 (ref 5–15)
BUN: 22 mg/dL (ref 8–23)
CO2: 25 mmol/L (ref 22–32)
Calcium: 9 mg/dL (ref 8.9–10.3)
Chloride: 108 mmol/L (ref 98–111)
Creatinine, Ser: 0.89 mg/dL (ref 0.44–1.00)
GFR, Estimated: >60 mL/min (ref >60)
Glucose, Bld: 97 mg/dL (ref 70–99)
Potassium: 3.7 mmol/L (ref 3.5–5.1)
Sodium: 140 mmol/L (ref 135–145)
Total Bilirubin: 0.6 mg/dL (ref 0.3–1.2)
Total Protein: 6.8 g/dL (ref 6.5–8.1)

## 2021-01-09 LAB — CBC WITH DIFFERENTIAL/PLATELET
Abs Immature Granulocytes: 0.01 10*3/uL (ref 0.00–0.07)
Basophils Absolute: 0 10*3/uL (ref 0.0–0.1)
Basophils Relative: 0 %
Eosinophils Absolute: 0.1 10*3/uL (ref 0.0–0.5)
Eosinophils Relative: 1 %
HCT: 41 % (ref 36.0–46.0)
Hemoglobin: 13.4 g/dL (ref 12.0–15.0)
Immature Granulocytes: 0 %
Lymphocytes Relative: 27 %
Lymphs Abs: 1.3 10*3/uL (ref 0.7–4.0)
MCH: 27.5 pg (ref 26.0–34.0)
MCHC: 32.7 g/dL (ref 30.0–36.0)
MCV: 84.2 fL (ref 80.0–100.0)
Monocytes Absolute: 0.5 10*3/uL (ref 0.1–1.0)
Monocytes Relative: 9 %
Neutro Abs: 3.1 10*3/uL (ref 1.7–7.7)
Neutrophils Relative %: 63 %
Platelets: 168 10*3/uL (ref 150–400)
RBC: 4.87 MIL/uL (ref 3.87–5.11)
RDW: 15.2 % (ref 11.5–15.5)
WBC: 4.9 10*3/uL (ref 4.0–10.5)
nRBC: 0 % (ref 0.0–0.2)

## 2021-01-09 LAB — URINE CULTURE: Culture: 30000 — AB

## 2021-01-09 MED ORDER — FUROSEMIDE 10 MG/ML IJ SOLN
40.0000 mg | Freq: Once | INTRAMUSCULAR | Status: AC
  Administered 2021-01-09: 40 mg via INTRAVENOUS
  Filled 2021-01-09: qty 4

## 2021-01-09 NOTE — NC FL2 (Signed)
[place] MEDICAID FL2 LEVEL OF CARE SCREENING TOOL     IDENTIFICATION  Patient Name: Tara [name] Birthdate: 09-11-1945 Sex: female Admission Date (Current Location): 01/07/2021  [facility] [place] and [place] Number:  Chiropodist and Address:  [facility], [address], [place], [place] [address]      Provider Number: [id number]  Attending Physician Name and Address:  Rolly Salter, MD  Relative Name and Phone Number:  Lawson Fiscal (daughter) [phone]    Current Level of Care: Hospital Recommended Level of Care: Skilled Nursing Facility Prior Approval Number:    Date Approved/Denied:   PASRR Number: [id number]  Discharge Plan: SNF    Current Diagnoses: Patient Active Problem List   Diagnosis Date Noted   Weakness 01/07/2021   Acute respiratory failure with hypoxia and hypercarbia (HCC) 01/07/2021   Tobacco abuse 12/11/2020   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   History of stroke 07/25/2020   Memory loss, short term 07/25/2020   Visual disturbance 07/25/2020   Tremor 07/25/2020   Difficulty walking 03/09/2020   TIA (transient ischemic attack) 03/09/2020   Polyneuropathy, unspecified 12/08/2019   Difficulty sleeping 10/29/2019   Numbness 10/29/2019   Right leg numbness 10/29/2019   Numbness and tingling in left hand 10/29/2019   Lymphedema 10/26/2019   Atherosclerosis of native arteries of extremity with intermittent claudication (HCC) 10/26/2019   Nerve pain 10/12/2019   Memory loss 10/12/2019   PAD (peripheral artery disease) (HCC) 08/02/2019   Temporal arteritis (HCC)    Other intervertebral disc degeneration, 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6 cm)  BEHAVIORAL SYMPTOMS/MOOD NEUROLOGICAL BOWEL NUTRITION STATUS      Continent Diet (see discharge summary)  AMBULATORY STATUS COMMUNICATION OF NEEDS Skin   Extensive Assist Verbally Normal                       Personal Care Assistance Level of Assistance  Bathing, Dressing, Feeding, Total care Bathing Assistance: Maximum assistance Feeding assistance: Limited assistance Dressing Assistance: Maximum assistance Total Care Assistance: Maximum assistance   Functional Limitations Info  Sight, Hearing, Speech Sight Info: Adequate Hearing Info: Adequate Speech Info: Adequate    SPECIAL CARE FACTORS FREQUENCY  PT (By licensed PT), OT (By licensed OT)     PT Frequency: min 4x weekly OT Frequency: min 4x weekly            Contractures Contractures Info: Not present    Additional Factors Info  Code Status, Allergies Code Status Info: Partial Allergies Info: No Known allergies           Current Medications (01/09/2021):  This is the current hospital active medication list Current Facility-Administered Medications  Medication Dose Route Frequency Provider Last Rate Last Admin   acetaminophen (TYLENOL) tablet 650 mg  650 mg Oral Q6H PRN Cox, Amy N, DO   650 mg at 01/07/21 2355  Or   acetaminophen (TYLENOL) suppository 650 mg  650 mg Rectal Q6H PRN Cox, Amy N, DO       amLODipine (NORVASC) tablet 10 mg  10 mg Oral Daily Cox, Amy N, DO   10 mg at 01/08/21 3664   aspirin EC tablet 81 mg  81 mg Oral Daily Cox, Amy N, DO   81 mg at 01/08/21 4034   citalopram (CELEXA) tablet 40 mg  40 mg Oral Daily Cox, Amy N, DO   40 mg  at 01/08/21 0919   clonazePAM (KLONOPIN) disintegrating tablet 0.25 mg  0.25 mg Oral Daily Cox, Amy N, DO   0.25 mg at 01/08/21 7425   clopidogrel (PLAVIX) tablet 75 mg  75 mg Oral Daily Cox, Amy N, DO   75 mg at 01/08/21 0920   cyanocobalamin ((VITAMIN B-12)) injection 1,000 mcg  1,000 mcg Subcutaneous Daily Rolly Salter, MD   1,000 mcg at 01/08/21 0919   donepezil (ARICEPT) tablet 5 mg  5 mg Oral Daily Cox, Amy N, DO   5 mg at 01/08/21 0919   enoxaparin (LOVENOX) injection 40 mg  40 mg Subcutaneous Q24H Cox, Amy N, DO   40 mg at 01/08/21 2343   famotidine (PEPCID) tablet 20 mg  20 mg Oral QHS Rolly Salter, MD   20 mg at 01/08/21 2345   fluticasone (FLONASE) 50 MCG/ACT nasal spray 1-2 spray  1-2 spray Each Nare Daily PRN Cox, Amy N, DO       lamoTRIgine (LAMICTAL) tablet 50 mg  50 mg Oral BID Cox, Amy N, DO   50 mg at 01/08/21 2344   lisinopril (ZESTRIL) tablet 40 mg  40 mg Oral Daily Cox, Amy N, DO   40 mg at 01/08/21 0918   montelukast (SINGULAIR) tablet 10 mg  10 mg Oral QPM Cox, Amy N, DO   10 mg at 01/08/21 1642   naproxen (NAPROSYN) tablet 375 mg  375 mg Oral BID PRN Cox, Amy N, DO       nicotine (NICODERM CQ - dosed in mg/24 hours) patch 21 mg  21 mg Transdermal Daily Cox, Amy N, DO   21 mg at 01/08/21 9563   nitroGLYCERIN (NITROSTAT) SL tablet 0.4 mg  0.4 mg Sublingual Q5 min PRN Cox, Amy N, DO       nortriptyline (PAMELOR) capsule 50 mg  50 mg Oral QHS Cox, Amy N, DO   50 mg at 01/08/21 2344   ondansetron (ZOFRAN) tablet 4 mg  4 mg Oral Q6H PRN Cox, Amy N, DO       Or   ondansetron (ZOFRAN) injection 4 mg  4 mg Intravenous Q6H PRN Cox, Amy N, DO       pantoprazole (PROTONIX) EC tablet 40 mg  40 mg Oral Daily Rolly Salter, MD   40 mg at 01/08/21 1851   pregabalin (LYRICA) capsule 75 mg  75 mg Oral BID Rolly Salter, MD   75 mg at 01/08/21 2344   rosuvastatin (CRESTOR) tablet 40 mg  40 mg Oral QHS Cox, Amy N, DO   40 mg at 01/08/21 2343   sodium chloride flush (NS) 0.9 %  injection 3 mL  3 mL Intravenous Q12H Rolly Salter, MD   3 mL at 01/08/21 2349   traZODone (DESYREL) tablet 150 mg  150 mg Oral QHS PRN Rolly Salter, MD         Discharge Medications: Please see discharge summary for a list of  discharge medications.  Relevant Imaging Results:  Relevant Lab Results:   Additional Information SSN:[id number]  Gildardo Griffes, LCSW

## 2021-01-09 NOTE — Progress Notes (Signed)
Attempted to perform pt NIF X2. Pt unavailable each visit.

## 2021-01-09 NOTE — Progress Notes (Signed)
Skin swarm completed with Regina Eck. No skin issues identified. Pt ambulated to bathroom with 2 assist and walker. Denies pain. Oriented to room and call light system. Adjustments made for vision issues.

## 2021-01-09 NOTE — Progress Notes (Signed)
Update 1939: Surveyor, mining in 1 C was given report. Will continue to monitor.   Pt daughter Lawson Fiscal made aware of pt transfer to 1 C rm 106.

## 2021-01-09 NOTE — Progress Notes (Signed)
NIF done -27cm

## 2021-01-09 NOTE — Progress Notes (Signed)
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 Now presented with bilateral leg weakness and numbness since Friday. Also reports frequent falls due to losing balance.   Currently plan is further work up for patient's frequent tremors and bilateral leg weakness  Assessment and Plan: 1. Bilateral lower extremity weakness Frequent falls B12 deficiency MRI of the brain with and without contrast was ordered by EDP and read as redemonstration of chronic cortical/subcortical infarcts within the bilateral frontal, parietal, occipital, left temporal lobes. -Subcentimeter focus of diffusion weighted hyperintensity within the left occipital brain infarction territories.  Secondary to chronic hemosiderin deposition or cortical laminar necrosis at the site, rather than superimposed acute infarct. B12 low, will replace with injection. Normal thyroid levels. B1 stable. Negative Bilateral lower extremity ultrasound for DVT. Negative MRI lumbar and thoracic spine for acute abnormality as well. Discussed with the daughter. Per daughter weakness in the legs is progressive for last few months. All symptoms started primarily with pain after her vascular surgery intervention and then she started having tremors which will cause her to have falls. The tremors progressed to the point that the patient become immobile and wheelchair-bound and caused more deconditioning. Patient has gone through multiple PT sessions and evaluations and therapy. Wellbutrin was recently started for smoking  cessation. Patient is on nortriptyline for neuropathy pain. Patient is on trazodone as needed only for sleep. Patient is on Celexa for depression. Lyrica was started by neurologist for neuropathy pain. Lamictal was started thinking that her tremors might have some association with seizure-like events. Currently patient's outpatient neurologist is weaning down Klonopin which was also started with the same concern of seizure-like events causing tremor. Appreciate neurology consultation.  Will monitor recommendation    2. History of seizure activity follows with Dr. Malvin Johns,  on 01/01/2021 patient was scheduled for 3-hour EEG in the future Patient had an EEG on 12/03/2020 which showed within normal limits  3. Possible obstructive sleep apnea CPAP nightly ordered Outpatient sleep study recommended.  4. Chronic knee and back pain Patient reports she does not currently take Percocets however requested Percocet Patient declines acetaminophen Due to concerns for respiratory decompensation, I have ordered naproxen  5. Tremors Lamictal 50 mg twice daily resumed, follow-up with outpatient neurologist.  6. History of myocardial infarct in 1990s and 2008 Per care everywhere review, patient had stent placement of unknown details on cardiology note in 09/29/2018. Resumed home aspirin and Plavix for secondary prevention.  7.  Bilateral leg edema. Will attempt IV Lasix and monitor.  8.  Mildly elevated renal dysfunction. Improved for now. Monitor after receiving IV diuresis.  Diet: cardiac diet DVT Prophylaxis:  enoxaparin (LOVENOX) injection 40 mg Start: 01/07/21 2200 Place TED hose Start: 01/07/21 1942   Advance goals of care discussion: Limited code  Family Communication: no family was present at bedside, at the time of interview.   Disposition:  Status is: Inpatient  Remains inpatient appropriate because:IV treatments appropriate due to intensity of illness or inability to take PO  and Inpatient level of care appropriate due to severity of illness  Dispo: The patient is from: Home  Anticipated d/c is to: SNF              Patient currently is not medically stable to d/c.   Difficult to place patient No  Subjective: Weakness still present.  No nausea no vomiting but no fever no chills.  Continues to have fatigue and tiredness.  Primary concern is edema in the leg.  Physical Exam:  General: Appear in mild distress, no Rash; Oral Mucosa Clear, moist. no Abnormal Neck Mass Or lumps, Conjunctiva normal  Cardiovascular: S1 and S2 Present, no Murmur, Respiratory: good respiratory effort, Bilateral Air entry present and CTA, no Crackles, no wheezes Abdomen: Bowel Sound present, Soft and no tenderness Extremities: bilateral Pedal edema Neurology: alert and oriented to time, place, and person affect appropriate. no new focal deficit Gait not checked due to patient safety concerns  Vitals:   01/08/21 1915 01/09/21 0524 01/09/21 0610 01/09/21 0921  BP:  (!) 172/67 (!) 173/65 (!) 146/54  Pulse: 66 72 66 70  Resp:  17    Temp:  98.6 F (37 C)  97.9 F (36.6 C)  TempSrc:      SpO2: 91% 96%  92%  Weight:      Height:        Intake/Output Summary (Last 24 hours) at 01/09/2021 1813 Last data filed at 01/09/2021 1435 Gross per 24 hour  Intake 0 ml  Output 1600 ml  Net -1600 ml   Filed Weights   01/07/21 1343  Weight: 78 kg    Data Reviewed: I have personally reviewed and interpreted daily labs, tele strips, imaging. I reviewed all nursing notes, pharmacy notes, vitals, pertinent old records I have discussed plan of care as described above with RN and patient/family.  CBC: Recent Labs  Lab 01/07/21 1348 01/08/21 0602 01/09/21 0808  WBC 6.5 5.0 4.9  NEUTROABS  --   --  3.1  HGB 13.1 12.9 13.4  HCT 39.8 39.9 41.0  MCV 85.6 84.2 84.2  PLT 166 162 168    Basic Metabolic Panel: Recent Labs  Lab 01/07/21 1348 01/08/21 0602 01/09/21 0808  NA  140 142 140  K 3.5 3.9 3.7  CL 110 110 108  CO2 23 26 25   GLUCOSE 125* 125* 97  BUN 30* 31* 22  CREATININE 1.03* 1.13* 0.89  CALCIUM 9.3 9.1 9.0     Studies: MR THORACIC SPINE W WO CONTRAST  Result Date: 01/09/2021 CLINICAL DATA:  Initial evaluation for acute bilateral lower extremity weakness, myelopathy, frequent falls. B12 deficiency. EXAM: MRI THORACIC AND LUMBAR SPINE WITHOUT AND WITH CONTRAST TECHNIQUE: Multiplanar and multiecho pulse sequences of the thoracic and lumbar spine were obtained without and with intravenous contrast. CONTRAST:  53mL GADAVIST GADOBUTROL 1 MMOL/ML IV SOLN COMPARISON:  Prior radiograph from 05/27/2019. FINDINGS: MRI THORACIC SPINE FINDINGS Alignment: Physiologic with preservation of the normal thoracic kyphosis. No significant listhesis. Vertebrae: Vertebral body height maintained without acute or chronic fracture. Bone marrow signal intensity within normal limits. Subcentimeter benign hemangioma noted within the T11 vertebral body. No worrisome osseous lesions. Mild discogenic reactive endplate change noted about the partially visualized C6-7 interspace. No other abnormal marrow edema or enhancement. Cord: Normal signal and morphology. No signal changes to suggest subacute combined degeneration. No abnormal enhancement. Normal cord caliber and morphology. Paraspinal and other soft tissues: Paraspinous soft tissues within normal limits. Few small benign appearing cyst noted within the visualized kidneys. Visualized visceral structures otherwise unremarkable. Disc levels: T1-2:  Unremarkable. T2-3: Unremarkable. T3-4:  Unremarkable. T4-5:  Unremarkable. T5-6: Small right central disc protrusion mildly indents the ventral thecal sac. No stenosis or cord impingement. Foramina remain patent. T6-7: Small right paracentral disc protrusion mildly flattens the right ventral thecal sac. No stenosis or cord impingement. Foramina remain patent. T7-8: Left paracentral disc protrusion  flattens and partially effaces the left ventral thecal sac. Mild flattening of the left ventral cord without cord signal changes or significant spinal stenosis. Foramina remain patent. T8-9: Left paracentral disc protrusion indents the left ventral thecal sac. Mild cord flattening without cord signal changes. No spinal stenosis. Foramina remain patent. T9-10: Unremarkable. T10-11: Mild disc bulge. Posterior element hypertrophy. No significant spinal stenosis. Foramina remain patent. T11-12:  Minimal disc bulge.  No stenosis. T12-L1: Small right central disc protrusion indents the right ventral thecal sac. No significant spinal stenosis. Foramina remain patent. MRI LUMBAR SPINE FINDINGS Segmentation: Transitional lumbosacral anatomy with sacralization of the L5 vertebral body. Vertebral body count made from the dens. L5-S1 interspace is rudimentary. Alignment: Trace anterolisthesis of L3 on L4, with 3 mm anterolisthesis of L4 on L5. Findings chronic and facet mediated. Alignment otherwise normal with preservation of the normal lumbar lordosis. Vertebrae: Vertebral body height maintained without acute or chronic fracture. Bone marrow signal intensity within normal limits. No worrisome osseous lesions. No abnormal marrow edema or enhancement. Conus medullaris: Extends to the L1 level and appears normal. No abnormal enhancement. Paraspinal and other soft tissues: Paraspinous soft tissues within normal limits. Multiple scattered benign appearing cyst noted about the visualized kidneys. Visualized visceral structures otherwise unremarkable. Disc levels: L1-2: Disc desiccation without disc bulge. Mild facet hypertrophy. No canal or foraminal stenosis. L2-3: Degenerative intervertebral disc space narrowing with diffuse disc bulge and disc desiccation. Associated reactive endplate change with marginal endplate osteophytic spurring. Changes more pronounced on the right. Moderate facet hypertrophy. Resultant mild spinal  stenosis. Moderate right L2 foraminal narrowing. Left neural foramina remains patent. L3-4: Trace anterolisthesis. Disc bulge with disc desiccation. Superimposed left foraminal disc protrusion impinges upon the exiting left L3 nerve root (series 31, image 13). Moderate facet and ligament flavum hypertrophy with associated small joint effusions. Resultant moderate spinal stenosis. Mild right with moderate left L3 foraminal narrowing. L4-5: Anterolisthesis. Disc desiccation with mild disc bulge. Severe facet arthrosis. No canal or lateral recess narrowing. Foramina remain patent. 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 No disc bulge or focal disc herniation. No significant stenosis. IMPRESSION: MRI THORACIC SPINE IMPRESSION: 1. No acute abnormality within the thoracic spine, with normal MRI appearance of the thoracic spinal cord. No evidence for subacute combined degeneration. No other cord signal changes to suggest myelopathy. 2. Multilevel degenerative spondylosis with small disc protrusions at T5-6 through T8-9 as above. No significant spinal stenosis. MRI LUMBAR SPINE IMPRESSION: 1. Normal MRI appearance of the conus medullaris and nerve roots of the cauda equina. 2. Multifactorial degenerative changes at L3-4 with resultant moderate spinal stenosis. Superimposed left foraminal disc protrusion impinges on the left L3 nerve root. 3. 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 4. 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 Electronically Signed   By: Rise Mu M.D.   On: 01/09/2021 02:06   MR Lumbar Spine W Wo Contrast  Result Date: 01/09/2021 CLINICAL DATA:  Initial evaluation for acute bilateral lower extremity weakness, myelopathy, frequent falls. B12 deficiency. EXAM: MRI THORACIC AND LUMBAR SPINE WITHOUT AND WITH CONTRAST TECHNIQUE: Multiplanar and  multiecho pulse sequences of the thoracic  and lumbar spine were obtained without and with intravenous contrast. CONTRAST:  19mL GADAVIST GADOBUTROL 1 MMOL/ML IV SOLN COMPARISON:  Prior radiograph from 05/27/2019. FINDINGS: MRI THORACIC SPINE FINDINGS Alignment: Physiologic with preservation of the normal thoracic kyphosis. No significant listhesis. Vertebrae: Vertebral body height maintained without acute or chronic fracture. Bone marrow signal intensity within normal limits. Subcentimeter benign hemangioma noted within the T11 vertebral body. No worrisome osseous lesions. Mild discogenic reactive endplate change noted about the partially visualized C6-7 interspace. No other abnormal marrow edema or enhancement. Cord: Normal signal and morphology. No signal changes to suggest subacute combined degeneration. No abnormal enhancement. Normal cord caliber and morphology. Paraspinal and other soft tissues: Paraspinous soft tissues within normal limits. Few small benign appearing cyst noted within the visualized kidneys. Visualized visceral structures otherwise unremarkable. Disc levels: T1-2:  Unremarkable. T2-3: Unremarkable. T3-4:  Unremarkable. T4-5:  Unremarkable. T5-6: Small right central disc protrusion mildly indents the ventral thecal sac. No stenosis or cord impingement. Foramina remain patent. T6-7: Small right paracentral disc protrusion mildly flattens the right ventral thecal sac. No stenosis or cord impingement. Foramina remain patent. T7-8: Left paracentral disc protrusion flattens and partially effaces the left ventral thecal sac. Mild flattening of the left ventral cord without cord signal changes or significant spinal stenosis. Foramina remain patent. T8-9: Left paracentral disc protrusion indents the left ventral thecal sac. Mild cord flattening without cord signal changes. No spinal stenosis. Foramina remain patent. T9-10: Unremarkable. T10-11: Mild disc bulge. Posterior element hypertrophy. No  significant spinal stenosis. Foramina remain patent. T11-12:  Minimal disc bulge.  No stenosis. T12-L1: Small right central disc protrusion indents the right ventral thecal sac. No significant spinal stenosis. Foramina remain patent. MRI LUMBAR SPINE FINDINGS Segmentation: Transitional lumbosacral anatomy with sacralization of the L5 vertebral body. Vertebral body count made from the dens. L5-S1 interspace is rudimentary. Alignment: Trace anterolisthesis of L3 on L4, with 3 mm anterolisthesis of L4 on L5. Findings chronic and facet mediated. Alignment otherwise normal with preservation of the normal lumbar lordosis. Vertebrae: Vertebral body height maintained without acute or chronic fracture. Bone marrow signal intensity within normal limits. No worrisome osseous lesions. No abnormal marrow edema or enhancement. Conus medullaris: Extends to the L1 level and appears normal. No abnormal enhancement. Paraspinal and other soft tissues: Paraspinous soft tissues within normal limits. Multiple scattered benign appearing cyst noted about the visualized kidneys. Visualized visceral structures otherwise unremarkable. Disc levels: L1-2: Disc desiccation without disc bulge. Mild facet hypertrophy. No canal or foraminal stenosis. L2-3: Degenerative intervertebral disc space narrowing with diffuse disc bulge and disc desiccation. Associated reactive endplate change with marginal endplate osteophytic spurring. Changes more pronounced on the right. Moderate facet hypertrophy. Resultant mild spinal stenosis. Moderate right L2 foraminal narrowing. Left neural foramina remains patent. L3-4: Trace anterolisthesis. Disc bulge with disc desiccation. Superimposed left foraminal disc protrusion impinges upon the exiting left L3 nerve root (series 31, image 13). Moderate facet and ligament flavum hypertrophy with associated small joint effusions. Resultant moderate spinal stenosis. Mild right with moderate left L3 foraminal narrowing.  L4-5: Anterolisthesis. Disc desiccation with mild disc bulge. Severe facet arthrosis. No canal or lateral recess narrowing. Foramina remain patent. 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 No disc bulge or focal disc herniation. No significant stenosis. IMPRESSION: MRI THORACIC SPINE IMPRESSION: 1. No acute abnormality within the thoracic spine, with normal MRI appearance of the thoracic spinal cord. No evidence for subacute combined degeneration. No other cord signal changes to suggest myelopathy. 2. Multilevel  degenerative spondylosis with small disc protrusions at T5-6 through T8-9 as above. No significant spinal stenosis. MRI LUMBAR SPINE IMPRESSION: 1. Normal MRI appearance of the conus medullaris and nerve roots of the cauda equina. 2. Multifactorial degenerative changes at L3-4 with resultant moderate spinal stenosis. Superimposed left foraminal disc protrusion impinges on the left L3 nerve root. 3. 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 4. 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 Electronically Signed   By: Rise MuBenjamin  McClintock M.D.   On: 01/09/2021 02:06    Scheduled Meds:  amLODipine  10 mg Oral Daily   aspirin EC  81 mg Oral Daily   citalopram  40 mg Oral Daily   clonazePAM  0.25 mg Oral Daily   clopidogrel  75 mg Oral Daily   cyanocobalamin  1,000 mcg Subcutaneous Daily   donepezil  5 mg Oral Daily   enoxaparin (LOVENOX) injection  40 mg Subcutaneous Q24H   lamoTRIgine  50 mg Oral BID   lisinopril  40 mg Oral Daily   montelukast  10 mg Oral QPM   nicotine  21 mg Transdermal Daily   nortriptyline  50 mg Oral QHS   pantoprazole  40 mg Oral Daily   pregabalin  75 mg Oral BID   rosuvastatin  40 mg Oral QHS   sodium chloride flush  3 mL Intravenous Q12H   Continuous Infusions: PRN Meds: acetaminophen **OR** acetaminophen, fluticasone,  naproxen, nitroGLYCERIN, ondansetron **OR** ondansetron (ZOFRAN) IV, traZODone  Time spent: 35 minutes  Author: Lynden OxfordPranav Halynn Reitano, MD [facility] 01/09/2021 6:13 PM  To reach On-call, see care teams to locate the attending and reach out via [email or web address]. Between 7PM-7AM, please contact night-coverage If you still have difficulty reaching the attending provider, please page the [facility] (Director on Call) for [facility] on amion for assistance.

## 2021-01-09 NOTE — TOC Initial Note (Signed)
Transition of Care [facility]) - Initial/Assessment Note    Patient Details  Name: Tara [name] MRN: [id number] Date of Birth: Jan 12, 1946  Transition of [facility]) CM/SW Contact:    Gildardo Griffes, LCSW Phone Number: 01/09/2021, 9:02 AM  Clinical Narrative:                  CSW spoke with patient's daughter Lawson Fiscal regarding SNF recommendation from PT and OT. Lawson Fiscal reports she plans on having a discussion with patient's husband and patient today to determine discharge plan and SNF preference. Lawson Fiscal will call CSW following discussion.    Expected Discharge Plan: Skilled Nursing Facility Barriers to Discharge: Continued Medical Work up   Patient Goals and CMS Choice   CMS Medicare.gov Compare Post Acute Care list provided to:: Patient Represenative (must comment) (daughter Lawson Fiscal)    Expected Discharge Plan and Services Expected Discharge Plan: Skilled Nursing Facility       Living arrangements for the past 2 months: Single Family Home                                      Prior Living Arrangements/Services Living arrangements for the past 2 months: Single Family Home Lives with:: Self Patient language and need for interpreter reviewed:: Yes        Need for Family Participation in Patient Care: Yes (Comment) Care giver support system in place?: Yes (comment)   Criminal Activity/Legal Involvement Pertinent to Current Situation/Hospitalization: No - Comment as needed  Activities of Daily Living Home Assistive Devices/Equipment: Dentures (specify type), Walker (specify type) ADL Screening (condition at time of admission) Patient's cognitive ability adequate to safely complete daily activities?: Yes Is the patient deaf or have difficulty hearing?: No Does the patient have difficulty seeing, even when wearing glasses/contacts?: Yes Does the patient have difficulty concentrating, remembering, or making decisions?: No Patient able to express need for assistance with ADLs?:  Yes Does the patient have difficulty dressing or bathing?: Yes Independently performs ADLs?: No Communication: Independent Dressing (OT): Dependent Is this a change from baseline?: Pre-admission baseline Grooming: Independent Feeding: Independent Bathing: Dependent Is this a change from baseline?: Pre-admission baseline Walks in Home: Dependent Is this a change from baseline?: Pre-admission baseline Does the patient have difficulty walking or climbing stairs?: Yes Weakness of Legs: Both Weakness of Arms/Hands: Both  Permission Sought/Granted Permission sought to share information with : Case Manager, Magazine features editor, Family Supports Permission granted to share information with : Yes, Verbal Permission Granted  Share Information with NAME: Lawson Fiscal  Permission granted to share info w AGENCY: SNFs  Permission granted to share info w Relationship: daughter  Permission granted to share info w Contact Information: [phone]  Emotional Assessment       Orientation: : Oriented to Self, Oriented to Place, Oriented to  Time, Oriented to Situation Alcohol / Substance Use: Not Applicable Psych Involvement: No (comment)  Admission diagnosis:  Hypercapnia [R06.89] Weakness [R53.1] Leg swelling [M79.89] Acute cystitis without hematuria [N30.00] Acute encephalopathy [G93.40] Acute respiratory failure with hypoxia and hypercarbia (HCC) [J96.01, J96.02] Patient Active Problem List   Diagnosis Date Noted   Weakness 01/07/2021   Acute respiratory failure with hypoxia and hypercarbia (HCC) 01/07/2021   Tobacco abuse 12/11/2020   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   History of stroke 07/25/2020   Memory loss, short term 07/25/2020   Visual disturbance 07/25/2020   Tremor  07/25/2020   Difficulty walking 03/09/2020   TIA (transient ischemic attack) 03/09/2020   Polyneuropathy, unspecified 12/08/2019   Difficulty sleeping 10/29/2019    Numbness 10/29/2019   Right leg numbness 10/29/2019   Numbness and tingling in left hand 10/29/2019   Lymphedema 10/26/2019   Atherosclerosis of native arteries of extremity with intermittent claudication (HCC) 10/26/2019   Nerve pain 10/12/2019   Memory loss 10/12/2019   PAD (peripheral artery disease) (HCC) 08/02/2019   Temporal arteritis (HCC)    Other intervertebral disc degeneration, thoracic region 06/21/2019   GERD (gastroesophageal reflux disease) 06/21/2019   Angiodysplasia of intestinal tract    Intestinal bypass or anastomosis status    History of aortic valve replacement 02/07/2019   B12 deficiency 02/07/2019   Anxiety 02/05/2019   HTN (hypertension) 02/05/2019   Hyperlipidemia 02/05/2019   IBS (irritable bowel syndrome) 02/05/2019   Arthritis of both knees 02/05/2019   CTS (carpal tunnel syndrome) 02/05/2019   PCP:  Dorcas Carrow, DO Pharmacy:   [facility] - [place], [place] - [address] [place] [address] Phone: [phone] Fax: [phone]     Social Determinants of Health (SDOH) Interventions    Readmission Risk Interventions Readmission Risk Prevention Plan 08/03/2019  Post Dischage Appt Complete  Medication Screening Complete  Transportation Screening Complete  Some recent data might be hidden

## 2021-01-10 DIAGNOSIS — W19XXXA Unspecified fall, initial encounter: Secondary | ICD-10-CM

## 2021-01-10 DIAGNOSIS — R251 Tremor, unspecified: Secondary | ICD-10-CM

## 2021-01-10 DIAGNOSIS — R600 Localized edema: Secondary | ICD-10-CM

## 2021-01-10 DIAGNOSIS — R569 Unspecified convulsions: Secondary | ICD-10-CM

## 2021-01-10 DIAGNOSIS — W19XXXD Unspecified fall, subsequent encounter: Secondary | ICD-10-CM

## 2021-01-10 LAB — BASIC METABOLIC PANEL
Anion gap: 8 (ref 5–15)
BUN: 26 mg/dL — ABNORMAL HIGH (ref 8–23)
CO2: 27 mmol/L (ref 22–32)
Calcium: 9.3 mg/dL (ref 8.9–10.3)
Chloride: 107 mmol/L (ref 98–111)
Creatinine, Ser: 1.17 mg/dL — ABNORMAL HIGH (ref 0.44–1.00)
GFR, Estimated: 50 mL/min — ABNORMAL LOW (ref >60)
Glucose, Bld: 134 mg/dL — ABNORMAL HIGH (ref 70–99)
Potassium: 3.6 mmol/L (ref 3.5–5.1)
Sodium: 142 mmol/L (ref 135–145)

## 2021-01-10 LAB — MAGNESIUM: Magnesium: 2.1 mg/dL (ref 1.7–2.4)

## 2021-01-10 NOTE — TOC Progression Note (Signed)
Transition of Care (TOC) - Progression Note    Patient Details  Name: Rockelle Louise Arington MRN: [id number] Date of Birth: 01/16/1946  Transition of Care (TOC) CM/SW Contact   M , RN Phone Number: 01/10/2021, 1:57 PM  Clinical Narrative:    RNCM met with patient at the bedside this morning.  Patient reports that she is agreeable to SNF if necessary and she prefers the [facility] in [place].  Bed search has been started.  RNCM called and spoke with patient's daughter with patient's permission.  Daughter reports she is on her way to meet with patient and neurology and will call RNCM back.  Daughter reports that patient did not really want to go to SNF but that she and her father supported the patient in going to SNF for rehab.     Expected Discharge Plan: Skilled Nursing Facility Barriers to Discharge: Continued Medical Work up  Expected Discharge Plan and Services Expected Discharge Plan: Skilled Nursing Facility       Living arrangements for the past 2 months: Single Family Home                                       Social Determinants of Health (SDOH) Interventions    Readmission Risk Interventions Readmission Risk Prevention Plan 08/03/2019  Post Dischage Appt Complete  Medication Screening Complete  Transportation Screening Complete  Some recent data might be hidden    

## 2021-01-10 NOTE — Plan of Care (Signed)
End of Shift Summary:  Remained on room air, VSS. Denies pain or n/v. Urine output adequate. Remained free from falls or injury. Bed low and in locked position. Call bell within reach and able to use.   Problem: Education: Goal: Knowledge of General Education information will improve Description: Including pain rating scale, medication(s)/side effects and non-pharmacologic comfort measures Outcome: Progressing   Problem: Health Behavior/Discharge Planning: Goal: Ability to manage health-related needs will improve Outcome: Progressing   Problem: Clinical Measurements: Goal: Ability to maintain clinical measurements within normal limits will improve Outcome: Progressing Goal: Will remain free from infection Outcome: Progressing Goal: Diagnostic test results will improve Outcome: Progressing Goal: Respiratory complications will improve Outcome: Progressing Goal: Cardiovascular complication will be avoided Outcome: Progressing   Problem: Elimination: Goal: Will not experience complications related to bowel motility Outcome: Progressing Goal: Will not experience complications related to urinary retention Outcome: Progressing   Problem: Coping: Goal: Level of anxiety will decrease Outcome: Progressing   Problem: Safety: Goal: Ability to remain free from injury will improve Outcome: Progressing   Problem: Skin Integrity: Goal: Risk for impaired skin integrity will decrease Outcome: Progressing

## 2021-01-10 NOTE — Progress Notes (Signed)
Occupational Therapy Treatment Patient Details Name: Tara [name] MRN: [id number] DOB: 1946-07-02 Today's Date: 01/10/2021    History of present illness Pixie Burgener Melvin is a 75 y.o. 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     She reports sudden onset of bilateral leg weakness and feeling like her legs are lead heavy.   These symptoms started at approximately 3 AM on day of presentation.  She was not able to return to sleep after this and did not have any p.o. intake after this.  She also experienced difficulty ambulating at this time.   OT comments  Ms Alwin was seen for OT treatment on this date. Upon arrival to room pt in bed with daughter at bedside, agreeable to session. Pt requires  MOD A don underwear at bed level. MOD A + RW for BSC t/f. CGA for perihygiene c lateral leans. SETUP + CGA grooming sitting EOB - initial MIN A to maintain sitting balance improving to CGA. 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 Pt making good progress toward goals. Pt continues to benefit from skilled OT services to maximize return to PLOF and minimize risk of future falls, injury, caregiver burden, and readmission. Will continue to follow POC. Discharge recommendation remains appropriate.    Follow Up Recommendations  SNF    Equipment Recommendations  3 in 1 bedside commode    Recommendations for Other Services      Precautions / Restrictions Precautions Precautions: Fall Restrictions Weight Bearing Restrictions: No       Mobility Bed Mobility Overal bed  mobility: Needs Assistance Bed Mobility: Supine to Sit;Sit to Supine     Supine to sit: Min assist Sit to supine: Min assist        Transfers Overall transfer level: Needs assistance Equipment used: Rolling walker (2 wheeled) Transfers: Sit to/from [employer] Sit to Stand: Mod assist;From elevated surface Stand pivot transfers: Mod assist;From elevated surface       General transfer comment: posterior lean with initial standing    Balance Overall balance assessment: Needs assistance Sitting-balance support: Feet supported Sitting balance-Leahy Scale: Good     Standing balance support: During functional activity;Single extremity supported Standing balance-Leahy Scale: Poor Standing balance comment: Requires assistance for standing balance and dynamic standing activities                           ADL either performed or assessed with clinical judgement   ADL Overall ADL's : Needs assistance/impaired                                       General ADL Comments: MOD A don underwear at bed level. MOD A + RW for BSC t/f. CGA for perihygiene c lateral leans. SETUP + CGA grooming sitting EOB - initial MIN A to maintain sitting balance improving to CGA.      Cognition Arousal/Alertness: Awake/alert Behavior During Therapy: WFL for tasks assessed/performed Overall Cognitive Status: Within Functional Limits for tasks assessed  Exercises Exercises: Other exercises;General Upper Extremity;General Lower Extremity General Exercises - Upper Extremity Shoulder Flexion: AROM;Strengthening;Both;10 reps;Seated Shoulder Extension: AROM;Strengthening;Both;10 reps;Seated Shoulder Horizontal ABduction: AROM;Strengthening;Both;10 reps;Seated Shoulder Horizontal ADduction: AROM;Strengthening;Both;10 reps;Seated Elbow Flexion: AROM;Strengthening;Both;10 reps;Seated Elbow Extension:  AROM;Strengthening;Both;10 reps;Seated General Exercises - Lower Extremity Gluteal Sets: AROM;Strengthening;Both;10 reps;Seated Long Arc Quad: AROM;Strengthening;Both;10 reps;Seated Hip Flexion/Marching: AROM;Strengthening;Both;10 reps;Seated Other Exercises Other Exercises: Pt and family educated re: DME recs, d/c recs, falls prevention, visual scanning strategies, HEP Other Exercises: LBD, toileting, sup<>sit, sit<>stand, sitting/standing balance/tolerance, SPT           Pertinent Vitals/ Pain       Pain Assessment: No/denies pain         Frequency  Min 2X/week        Progress Toward Goals  OT Goals(current goals can now be found in the care plan section)  Progress towards OT goals: Progressing toward goals  Acute Rehab OT Goals Patient Stated Goal: agreeable to rehab OT Goal Formulation: With patient Time For Goal Achievement: 01/22/21 Potential to Achieve Goals: Good ADL Goals Pt Will Perform Grooming: with supervision;standing Pt Will Perform Lower Body Dressing: with min assist;sit to/from stand Pt Will Transfer to Toilet: with modified independence;stand pivot transfer;bedside commode Pt Will Perform Toileting - Clothing Manipulation and hygiene: with modified independence;sitting/lateral leans  Plan Discharge plan remains appropriate;Frequency remains appropriate       AM-PAC OT "6 Clicks" Daily Activity     Outcome Measure   Help from another person eating meals?: None Help from another person taking care of personal grooming?: A Little Help from another person toileting, which includes using toliet, bedpan, or urinal?: A Lot Help from another person bathing (including washing, rinsing, drying)?: A Lot Help from another person to put on and taking off regular upper body clothing?: A Little Help from another person to put on and taking off regular lower body clothing?: A Lot 6 Click Score: 16    End of Session Equipment Utilized During Treatment: Rolling  walker  OT Visit Diagnosis: Other abnormalities of gait and mobility (R26.89);Muscle weakness (generalized) (M62.81)   Activity Tolerance Patient tolerated treatment well   Patient Left in bed;with call bell/phone within [name];with bed alarm set;with family/visitor present   Nurse Communication          Time: 2585-2778 OT Time Calculation (min): 40 min  Charges: OT General Charges $OT Visit: 1 Visit OT Treatments $Self Care/Home Management : 23-37 mins $Therapeutic Exercise: 8-22 mins  Kathie Dike, M.S. OTR/L  01/10/21, 3:04 PM  ascom [phone]

## 2021-01-10 NOTE — Progress Notes (Signed)
Physical Therapy Treatment Patient Details Name: Tara [name] MRN: [id number] DOB: [birth year] Today's Date: 01/10/2021    History of Present Illness Tara [name] is a 75 y.o. 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     She reports sudden onset of bilateral leg weakness and feeling like her legs are lead heavy.   These symptoms started at approximately 3 AM on day of presentation.  She was not able to return to sleep after this and did not have any p.o. intake after this.  She also experienced difficulty ambulating at this time.    PT Comments    Pt was pleasant and motivated to participate during the session and put forth good effort throughout. Pt required occasional physical assistance with all functional tasks.  She was able to increase her walking distance to 10 feet this session but did require min A for stability.  Pt did present with occasional min LE tremors in standing and during gait and is at a very high risk for falls.  Pt's SpO2 and HR were WNL on room air with pt reporting no adverse symptoms other than mild R knee pain.  Pt will benefit from PT services in a SNF setting upon discharge to safely address deficits listed in patient problem list for decreased caregiver assistance and eventual return to PLOF.     Follow Up Recommendations  SNF     Equipment Recommendations  None recommended by PT    Recommendations for Other Services       Precautions / Restrictions Precautions Precautions: Fall Restrictions Weight Bearing Restrictions: No Other Position/Activity Restrictions: Significant visual  deficits    Mobility  Bed Mobility Overal bed mobility: Needs Assistance Bed Mobility: Supine to Sit;Sit to Supine     Supine to sit: Supervision Sit to supine: Mod assist   General bed mobility comments: Mod A for BLE control during sit to sup    Transfers Overall transfer level: Needs assistance Equipment used: Rolling walker (2 wheeled) Transfers: Sit to/from [employer] Sit to Stand: Mod assist;From elevated surface Stand pivot transfers: Mod assist;From elevated surface       General transfer comment: Mod to max verbal and tactile cues for increased trunk flex, BLE positionging, and hand placement; mod A to come to standing and to prevent posterior LOB upon initial stand  Ambulation/Gait Ambulation/Gait assistance: Min assist Gait Distance (Feet): 10 Feet Assistive device: Rolling walker (2 wheeled) Gait Pattern/deviations: Step-through pattern;Decreased step length - right;Decreased step length - left;Trunk flexed Gait velocity: decreased   General Gait Details: Slow cadence and short B step length with flexed trunk posture and occasional min A for stability   Stairs             Wheelchair Mobility    Modified Rankin (Stroke Patients Only)       Balance Overall balance assessment: Needs assistance Sitting-balance support: Feet supported Sitting balance-Leahy Scale: Good     Standing balance support: During functional activity;Bilateral upper extremity supported Standing balance-Leahy Scale: Poor Standing balance comment: Physical assist needed to prevent LOB in standing                            Cognition  Arousal/Alertness: Awake/alert Behavior During Therapy: WFL for tasks assessed/performed Overall Cognitive Status: Within Functional Limits for tasks assessed                                        Exercises Total Joint Exercises Ankle Circles/Pumps: AROM;Strengthening;Both;10 reps Quad Sets:  Strengthening;Both;10 reps Gluteal Sets: Strengthening;Both;10 reps Heel Slides: AAROM;Both;5 reps Hip ABduction/ADduction: AAROM;Strengthening;Both;10 reps Straight Leg Raises: 10 reps;Both;Strengthening;AAROM General Exercises - Upper Extremity Shoulder Flexion: AROM;Strengthening;Both;10 reps;Seated Shoulder Extension: AROM;Strengthening;Both;10 reps;Seated Shoulder Horizontal ABduction: AROM;Strengthening;Both;10 reps;Seated Shoulder Horizontal ADduction: AROM;Strengthening;Both;10 reps;Seated Elbow Flexion: AROM;Strengthening;Both;10 reps;Seated Elbow Extension: AROM;Strengthening;Both;10 reps;Seated General Exercises - Lower Extremity Gluteal Sets: AROM;Strengthening;Both;10 reps;Seated Long Arc Quad: AROM;Strengthening;Both;10 reps;Seated Hip Flexion/Marching: AROM;Strengthening;Both;10 reps;Seated Other Exercises Other Exercises: Static standing at EOB without UE support, poor stability throughout Other Exercises: LBD, toileting, sup<>sit, sit<>stand, sitting/standing balance/tolerance, SPT    General Comments        Pertinent Vitals/Pain Pain Assessment: 0-10 Pain Score: 2  Pain Location: R knee Pain Descriptors / Indicators: Sore Pain Intervention(s): Monitored during session;Repositioned    Home Living                      Prior Function            PT Goals (current goals can now be found in the care plan section) Acute Rehab PT Goals Patient Stated Goal: agreeable to rehab Progress towards PT goals: Progressing toward goals    Frequency    Min 2X/week      PT Plan Current plan remains appropriate    Co-evaluation              AM-PAC PT "6 Clicks" Mobility   Outcome Measure  Help needed turning from your back to your side while in a flat bed without using bedrails?: A Little Help needed moving from lying on your back to sitting on the side of a flat bed without using bedrails?: A Little Help needed moving to and from a bed to a  chair (including a wheelchair)?: A Lot Help needed standing up from a chair using your arms (e.g., wheelchair or bedside chair)?: A Lot Help needed to walk in hospital room?: A Lot Help needed climbing 3-5 steps with a railing? : Total 6 Click Score: 13    End of Session Equipment Utilized During Treatment: Gait belt Activity Tolerance: Patient tolerated treatment well Patient left: in bed;with call bell/phone within reach;with bed alarm set;Other (comment) (Pt declined up in chair) Nurse Communication: Mobility status PT Visit Diagnosis: Other abnormalities of gait and mobility (R26.89);Muscle weakness (generalized) (M62.81);Difficulty in walking, not elsewhere classified (R26.2)     Time: 8416-6063 PT Time Calculation (min) (ACUTE ONLY): 23 min  Charges:  $Gait Training: 8-22 mins $Therapeutic Exercise: 8-22 mins                     D. Scott Maylea Soria PT, DPT 01/10/21, 4:40 PM

## 2021-01-10 NOTE — Progress Notes (Signed)
PROGRESS NOTE    Tara [name]  ZOX:[id number] DOB: May 14, 1946 DOA: 01/07/2021 PCP: Dorcas Carrow, DO (Confirm with patient/family/NH records and if not entered, this HAS to be entered at [facility] point of entry. 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 Now presented with bilateral leg weakness and numbness since Friday. Also reports frequent falls due to losing balance.   Currently plan is further work up for patient's frequent tremors and bilateral leg weakness     Assessment & Plan:   Active Problems:   Anxiety   HTN (hypertension)   Hyperlipidemia   IBS (irritable bowel syndrome)   History of aortic valve replacement   B12 deficiency   GERD (gastroesophageal reflux disease)   PAD (peripheral artery disease) (HCC)   Difficulty sleeping   History of stroke   Tobacco abuse   TIA (transient ischemic attack)   Tremors of nervous system   Weakness   Acute respiratory failure with hypoxia and hypercarbia (HCC)   Fall   Lower extremity edema   Seizure-like activity (HCC)   #1 bilateral lower extremity weakness and tremors/frequent falls  -Questionable etiology. -Head CT/MRI brain with no acute abnormalities. -MRI brain with chronic cortical/subcortical infarcts within the bilateral frontal, parietal, occipital, left temporal lobes.  Subcentimeter focus of diffusion weighted hyperintensity within the left occipital brain infarct territories.  Secondary to chronic hemosiderin deposition or cortical laminar necrosis at the site, rather than superimposed acute infarct. -TSH within normal limits.  Vitamin B1  stable. -Lower extremity Dopplers negative for DVT. -Patient noted to have low vitamin B12 levels which are being repleted. -Per Dr. Allena Katz daughter stated weakness in legs have been progressive over the last few months and symptoms started primarily with pain after her vascular surgery intervention and subsequently started having tremors which leads to the falls. -Tremors noted to have progressed to the point where patient has become immobile and wheelchair-bound leading to deconditioning. -Patient noted to have gone through multiple PT sessions and evaluations and therapy. -Wellbutrin noted to have been recently started for smoking cessation. -Patient noted to have been on nortriptyline for neuropathic pain, trazodone as needed for sleep, Celexa for depression.  Trazodone as needed for insomnia. -Patient noted to be started on Lyrica per neurologist for neuropathic pain. -Lamictal was started due to concern for associated seizure-like events. -Patient's neurologist in the outpatient setting noted to be weaning down Klonopin which was also started due to concern for seizure-like events leading to tremor. -Patient seen in consultation by neurology who are following.  2.  History of seizure-like activity -Patient being followed in the outpatient setting by Dr. Malvin Johns. -Patient noted to have been scheduled for 3-hour EEG. -EEG done on 12/03/2020 within normal limits.  3.  Possible OSA -CPAP nightly -We will need outpatient sleep study.  4.  Chronic back pain and knee pain -Patient noted to have reported to Dr. Allena Katz that she does not currently take Percocets however had requested Percocet. -Continue Naprosyn due to concerns for respiratory depression.  5.  Tremors. -Continue Lamictal 50 mg twice daily. -Outpatient follow-up with primary neurologist.  6.  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 -Continue aspirin, Plavix for  secondary prevention. -Outpatient follow-up with cardiology.  7.  Bilateral lower extremity edema -Patient placed on IV Lasix.  8.  Mildly elevated renal dysfunction -Follow with diuresis.  9.  Vitamin B12 deficiency -Continue vitamin B12 supplementation.   DVT prophylaxis: Lovenox Code Status: Partial Family Communication: No family at bedside. Disposition:   Status is: Inpatient  Remains inpatient appropriate because:Inpatient level of care appropriate due to severity of illness  Dispo: The patient is from: Home              Anticipated d/c is to:  TBD              Patient currently is not medically stable to d/c.   Difficult to place patient No       Consultants:  Neurology: Dr. Selina Cooley 01/09/2021  Procedures:  Plain films of the left shoulder 01/08/2021 Chest x-ray 01/07/2021 Plain films of the pelvis and left hip 01/08/2021 MRI of the T and L-spine 01/08/2021 MRI brain 01/07/2021 Lower extremity Doppler 01/07/2021   Antimicrobials:  None   Subjective: Sleeping soundly.   Objective: Vitals:   01/10/21 0800 01/10/21 1100 01/10/21 1228 01/10/21 1557  BP: (!) 141/68  (!) 145/51 (!) 134/58  Pulse: 66 66 64 70  Resp: 19 19 16 20   Temp: 97.8 F (36.6 C) 98.1 F (36.7 C) 97.6 F (36.4 C) 97.7 F (36.5 C)  TempSrc:      SpO2: 94% 92% (!) 89% 94%  Weight:      Height:        Intake/Output Summary (Last 24 hours) at 01/10/2021 2014 Last data filed at 01/09/2021 2350 Gross per 24 hour  Intake 237 ml  Output --  Net 237 ml   Filed Weights   01/07/21 1343  Weight: 78 kg    Examination:  General exam: Sleeping comfortably Respiratory system: Clear to auscultation anterior lung fields. Respiratory effort normal. Cardiovascular system: RRR. 3/6 SEM RUSB. No JVD, murmurs, rubs, gallops or clicks. No pedal edema. Gastrointestinal system: Abdomen is nondistended, soft and nontender. No organomegaly or masses felt. Normal bowel sounds heard. Central nervous  system: Sleeping deeply.  Moving extremities spontaneously.  No focal neurological deficits. Extremities: Symmetric 5 x 5 power. Skin: No rashes, lesions or ulcers Psychiatry: Judgement and insight unable to assess. Mood & affect unable to assess.     Data Reviewed: I have personally reviewed following labs and imaging studies  CBC: Recent Labs  Lab 01/07/21 1348 01/08/21 0602 01/09/21 0808  WBC 6.5 5.0 4.9  NEUTROABS  --   --  3.1  HGB 13.1 12.9 13.4  HCT 39.8 39.9 41.0  MCV 85.6 84.2 84.2  PLT 166 162 168    Basic Metabolic Panel: Recent Labs  Lab 01/07/21 1348 01/08/21 0602 01/09/21 0808 01/10/21 0540  NA 140 142 140 142  K 3.5 3.9 3.7 3.6  CL 110 110 108 107  CO2 23 26 25 27   GLUCOSE 125* 125* 97 134*  BUN 30* 31* 22 26*  CREATININE 1.03* 1.13* 0.89 1.17*  CALCIUM 9.3 9.1 9.0 9.3  MG  --   --   --  2.1    GFR: Estimated Creatinine Clearance: 43.9 mL/min (A) (by C-G formula based on SCr of 1.17 mg/dL (H)).  Liver Function Tests: Recent Labs  Lab 01/07/21 1348 01/09/21 0808  AST 24 22  ALT 17 16  ALKPHOS 51 52  BILITOT 0.9 0.6  PROT 7.7 6.8  ALBUMIN 4.6  3.8    CBG: No results for input(s): GLUCAP in the last 168 hours.   Recent Results (from the past 240 hour(s))  Urine culture     Status: Abnormal   Collection Time: 01/07/21  1:50 PM   Specimen: Urine, Random  Result Value Ref Range Status   Specimen Description   Final    URINE, RANDOM Performed at [facility], [address], [place], [place] [address]    Special Requests   Final    NONE Performed at [facility], [address] [place], [place] [address]    Culture (A)  Final    30,000 COLONIES/mL MULTIPLE SPECIES PRESENT, SUGGEST RECOLLECTION   Report Status 01/09/2021 FINAL  Final  Resp Panel by RT-PCR (Flu A&B, Covid) Nasopharyngeal Swab     Status: None   Collection Time: 01/07/21  5:06 PM   Specimen: Nasopharyngeal Swab; Nasopharyngeal(NP) swabs in vial  transport medium  Result Value Ref Range Status   SARS Coronavirus 2 by RT PCR NEGATIVE NEGATIVE Final    Comment: (NOTE) SARS-CoV-2 target nucleic acids are NOT DETECTED.  The SARS-CoV-2 RNA is generally detectable in upper respiratory specimens during the acute phase of infection. The lowest concentration of SARS-CoV-2 viral copies this assay can detect is 138 copies/mL. A negative result does not preclude SARS-Cov-2 infection and should not be used as the sole basis for treatment or other patient management decisions. A negative result may occur with  improper specimen collection/handling, submission of specimen other than nasopharyngeal swab, presence of viral mutation(s) within the areas targeted by this assay, and inadequate number of viral copies(<138 copies/mL). A negative result must be combined with clinical observations, patient history, and epidemiological information. The expected result is Negative.  Fact Sheet for Patients:  BloggerCourse.com  Fact Sheet for Healthcare Providers:  SeriousBroker.it  This test is no t yet approved or cleared by the [place] FDA and  has been authorized for detection and/or diagnosis of SARS-CoV-2 by FDA under an Emergency Use Authorization (EUA). This EUA will remain  in effect (meaning this test can be used) for the duration of the COVID-19 declaration under Section 564(b)(1) of the Act, 21 U.S.C.section 360bbb-3(b)(1), unless the authorization is terminated  or revoked sooner.       Influenza A by PCR NEGATIVE NEGATIVE Final   Influenza B by PCR NEGATIVE NEGATIVE Final    Comment: (NOTE) The Xpert Xpress SARS-CoV-2/FLU/RSV plus assay is intended as an aid in the diagnosis of influenza from Nasopharyngeal swab specimens and should not be used as a sole basis for treatment. Nasal washings and aspirates are unacceptable for Xpert Xpress SARS-CoV-2/FLU/RSV testing.  Fact  Sheet for Patients: BloggerCourse.com  Fact Sheet for Healthcare Providers: SeriousBroker.it  This test is not yet approved or cleared by the [place] FDA and has been authorized for detection and/or diagnosis of SARS-CoV-2 by FDA under an Emergency Use Authorization (EUA). This EUA will remain in effect (meaning this test can be used) for the duration of the COVID-19 declaration under Section 564(b)(1) of the Act, 21 U.S.C. section 360bbb-3(b)(1), unless the authorization is terminated or revoked.  Performed at [facility], [address] [place], [place] [address]          Radiology Studies: MR THORACIC SPINE W WO CONTRAST  Result Date: 01/09/2021 CLINICAL DATA:  Initial evaluation for acute bilateral lower extremity weakness, myelopathy, frequent falls. B12 deficiency. EXAM: MRI THORACIC AND LUMBAR SPINE WITHOUT AND WITH CONTRAST TECHNIQUE: Multiplanar and  multiecho pulse sequences of the thoracic and lumbar spine were obtained without and with intravenous contrast. CONTRAST:  63mL GADAVIST GADOBUTROL 1 MMOL/ML IV SOLN COMPARISON:  Prior radiograph from 05/27/2019. FINDINGS: MRI THORACIC SPINE FINDINGS Alignment: Physiologic with preservation of the normal thoracic kyphosis. No significant listhesis. Vertebrae: Vertebral body height maintained without acute or chronic fracture. Bone marrow signal intensity within normal limits. Subcentimeter benign hemangioma noted within the T11 vertebral body. No worrisome osseous lesions. Mild discogenic reactive endplate change noted about the partially visualized C6-7 interspace. No other abnormal marrow edema or enhancement. Cord: Normal signal and morphology. No signal changes to suggest subacute combined degeneration. No abnormal enhancement. Normal cord caliber and morphology. Paraspinal and other soft tissues: Paraspinous soft tissues within normal limits. Few small benign  appearing cyst noted within the visualized kidneys. Visualized visceral structures otherwise unremarkable. Disc levels: T1-2:  Unremarkable. T2-3: Unremarkable. T3-4:  Unremarkable. T4-5:  Unremarkable. T5-6: Small right central disc protrusion mildly indents the ventral thecal sac. No stenosis or cord impingement. Foramina remain patent. T6-7: Small right paracentral disc protrusion mildly flattens the right ventral thecal sac. No stenosis or cord impingement. Foramina remain patent. T7-8: Left paracentral disc protrusion flattens and partially effaces the left ventral thecal sac. Mild flattening of the left ventral cord without cord signal changes or significant spinal stenosis. Foramina remain patent. T8-9: Left paracentral disc protrusion indents the left ventral thecal sac. Mild cord flattening without cord signal changes. No spinal stenosis. Foramina remain patent. T9-10: Unremarkable. T10-11: Mild disc bulge. Posterior element hypertrophy. No significant spinal stenosis. Foramina remain patent. T11-12:  Minimal disc bulge.  No stenosis. T12-L1: Small right central disc protrusion indents the right ventral thecal sac. No significant spinal stenosis. Foramina remain patent. MRI LUMBAR SPINE FINDINGS Segmentation: Transitional lumbosacral anatomy with sacralization of the L5 vertebral body. Vertebral body count made from the dens. L5-S1 interspace is rudimentary. Alignment: Trace anterolisthesis of L3 on L4, with 3 mm anterolisthesis of L4 on L5. Findings chronic and facet mediated. Alignment otherwise normal with preservation of the normal lumbar lordosis. Vertebrae: Vertebral body height maintained without acute or chronic fracture. Bone marrow signal intensity within normal limits. No worrisome osseous lesions. No abnormal marrow edema or enhancement. Conus medullaris: Extends to the L1 level and appears normal. No abnormal enhancement. Paraspinal and other soft tissues: Paraspinous soft tissues within normal  limits. Multiple scattered benign appearing cyst noted about the visualized kidneys. Visualized visceral structures otherwise unremarkable. Disc levels: L1-2: Disc desiccation without disc bulge. Mild facet hypertrophy. No canal or foraminal stenosis. L2-3: Degenerative intervertebral disc space narrowing with diffuse disc bulge and disc desiccation. Associated reactive endplate change with marginal endplate osteophytic spurring. Changes more pronounced on the right. Moderate facet hypertrophy. Resultant mild spinal stenosis. Moderate right L2 foraminal narrowing. Left neural foramina remains patent. L3-4: Trace anterolisthesis. Disc bulge with disc desiccation. Superimposed left foraminal disc protrusion impinges upon the exiting left L3 nerve root (series 31, image 13). Moderate facet and ligament flavum hypertrophy with associated small joint effusions. Resultant moderate spinal stenosis. Mild right with moderate left L3 foraminal narrowing. L4-5: Anterolisthesis. Disc desiccation with mild disc bulge. Severe facet arthrosis. No canal or lateral recess narrowing. Foramina remain patent. 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 No disc bulge or focal disc herniation. No significant stenosis. IMPRESSION: MRI THORACIC SPINE IMPRESSION: 1. No acute abnormality within the thoracic spine, with normal MRI appearance of the thoracic spinal cord. No evidence for subacute combined degeneration. No other cord signal  changes to suggest myelopathy. 2. Multilevel degenerative spondylosis with small disc protrusions at T5-6 through T8-9 as above. No significant spinal stenosis. MRI LUMBAR SPINE IMPRESSION: 1. Normal MRI appearance of the conus medullaris and nerve roots of the cauda equina. 2. Multifactorial degenerative changes at L3-4 with resultant moderate spinal stenosis. Superimposed left foraminal disc protrusion impinges on the left L3 nerve root. 3. Degenerative disc disease and facet  hypertrophy at L2-3 with resultant mild canal with moderate right L2 foraminal stenosis. 4. 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 Electronically Signed   By: Rise Mu M.D.   On: 01/09/2021 02:06   MR Lumbar Spine W Wo Contrast  Result Date: 01/09/2021 CLINICAL DATA:  Initial evaluation for acute bilateral lower extremity weakness, myelopathy, frequent falls. B12 deficiency. EXAM: MRI THORACIC AND LUMBAR SPINE WITHOUT AND WITH CONTRAST TECHNIQUE: Multiplanar and multiecho pulse sequences of the thoracic and lumbar spine were obtained without and with intravenous contrast. CONTRAST:  59mL GADAVIST GADOBUTROL 1 MMOL/ML IV SOLN COMPARISON:  Prior radiograph from 05/27/2019. FINDINGS: MRI THORACIC SPINE FINDINGS Alignment: Physiologic with preservation of the normal thoracic kyphosis. No significant listhesis. Vertebrae: Vertebral body height maintained without acute or chronic fracture. Bone marrow signal intensity within normal limits. Subcentimeter benign hemangioma noted within the T11 vertebral body. No worrisome osseous lesions. Mild discogenic reactive endplate change noted about the partially visualized C6-7 interspace. No other abnormal marrow edema or enhancement. Cord: Normal signal and morphology. No signal changes to suggest subacute combined degeneration. No abnormal enhancement. Normal cord caliber and morphology. Paraspinal and other soft tissues: Paraspinous soft tissues within normal limits. Few small benign appearing cyst noted within the visualized kidneys. Visualized visceral structures otherwise unremarkable. Disc levels: T1-2:  Unremarkable. T2-3: Unremarkable. T3-4:  Unremarkable. T4-5:  Unremarkable. T5-6: Small right central disc protrusion mildly indents the ventral thecal sac. No stenosis or cord impingement. Foramina remain patent. T6-7: Small right paracentral disc protrusion mildly flattens the right ventral thecal sac. No stenosis or  cord impingement. Foramina remain patent. T7-8: Left paracentral disc protrusion flattens and partially effaces the left ventral thecal sac. Mild flattening of the left ventral cord without cord signal changes or significant spinal stenosis. Foramina remain patent. T8-9: Left paracentral disc protrusion indents the left ventral thecal sac. Mild cord flattening without cord signal changes. No spinal stenosis. Foramina remain patent. T9-10: Unremarkable. T10-11: Mild disc bulge. Posterior element hypertrophy. No significant spinal stenosis. Foramina remain patent. T11-12:  Minimal disc bulge.  No stenosis. T12-L1: Small right central disc protrusion indents the right ventral thecal sac. No significant spinal stenosis. Foramina remain patent. MRI LUMBAR SPINE FINDINGS Segmentation: Transitional lumbosacral anatomy with sacralization of the L5 vertebral body. Vertebral body count made from the dens. L5-S1 interspace is rudimentary. Alignment: Trace anterolisthesis of L3 on L4, with 3 mm anterolisthesis of L4 on L5. Findings chronic and facet mediated. Alignment otherwise normal with preservation of the normal lumbar lordosis. Vertebrae: Vertebral body height maintained without acute or chronic fracture. Bone marrow signal intensity within normal limits. No worrisome osseous lesions. No abnormal marrow edema or enhancement. Conus medullaris: Extends to the L1 level and appears normal. No abnormal enhancement. Paraspinal and other soft tissues: Paraspinous soft tissues within normal limits. Multiple scattered benign appearing cyst noted about the visualized kidneys. Visualized visceral structures otherwise unremarkable. Disc levels: L1-2: Disc desiccation without disc bulge. Mild facet hypertrophy. No canal or foraminal stenosis. L2-3: Degenerative intervertebral disc space narrowing with diffuse disc bulge and disc desiccation. Associated  reactive endplate change with marginal endplate osteophytic spurring. Changes more  pronounced on the right. Moderate facet hypertrophy. Resultant mild spinal stenosis. Moderate right L2 foraminal narrowing. Left neural foramina remains patent. L3-4: Trace anterolisthesis. Disc bulge with disc desiccation. Superimposed left foraminal disc protrusion impinges upon the exiting left L3 nerve root (series 31, image 13). Moderate facet and ligament flavum hypertrophy with associated small joint effusions. Resultant moderate spinal stenosis. Mild right with moderate left L3 foraminal narrowing. L4-5: Anterolisthesis. Disc desiccation with mild disc bulge. Severe facet arthrosis. No canal or lateral recess narrowing. Foramina remain patent. 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 No disc bulge or focal disc herniation. No significant stenosis. IMPRESSION: MRI THORACIC SPINE IMPRESSION: 1. No acute abnormality within the thoracic spine, with normal MRI appearance of the thoracic spinal cord. No evidence for subacute combined degeneration. No other cord signal changes to suggest myelopathy. 2. Multilevel degenerative spondylosis with small disc protrusions at T5-6 through T8-9 as above. No significant spinal stenosis. MRI LUMBAR SPINE IMPRESSION: 1. Normal MRI appearance of the conus medullaris and nerve roots of the cauda equina. 2. Multifactorial degenerative changes at L3-4 with resultant moderate spinal stenosis. Superimposed left foraminal disc protrusion impinges on the left L3 nerve root. 3. 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 4. 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 Electronically Signed   By: Rise MuBenjamin  McClintock M.D.   On: 01/09/2021 02:06        Scheduled Meds:  amLODipine  10 mg Oral Daily   aspirin EC  81 mg Oral Daily   citalopram  40 mg Oral Daily   clonazePAM  0.25 mg Oral Daily   clopidogrel  75 mg Oral Daily    cyanocobalamin  1,000 mcg Subcutaneous Daily   donepezil  5 mg Oral Daily   enoxaparin (LOVENOX) injection  40 mg Subcutaneous Q24H   lamoTRIgine  50 mg Oral BID   lisinopril  40 mg Oral Daily   montelukast  10 mg Oral QPM   nicotine  21 mg Transdermal Daily   nortriptyline  50 mg Oral QHS   pantoprazole  40 mg Oral Daily   pregabalin  75 mg Oral BID   rosuvastatin  40 mg Oral QHS   sodium chloride flush  3 mL Intravenous Q12H   Continuous Infusions:   LOS: 3 days    Time spent: 35 minutes    Ramiro Harvestaniel Chadd Tollison, MD [facility]   To contact the attending provider between 7A-7P or the covering provider during after hours 7P-7A, please log into the web site [email or web address] and access using universal [place] password for that web site. If you do not have the password, please call the hospital operator.  01/10/2021, 8:14 PM

## 2021-01-10 NOTE — Consult Note (Signed)
NEUROLOGY CONSULTATION NOTE   Date of service: January 10, 2021 Patient Name: Tara [name] MRN:  [id number] DOB:  04/13/1946 Reason for consult: tremors, inability to walk Consult requested by: Dr. Lynden OxfordPranav Patel _ _ _   _ __   _ __ _ _  __ __   _ __   __ _  History of Present Illness   Tara [name] is a 75 y.o. female with PMH significant for  has a past medical history of Anxiety, Arthritis, Barrett's syndrome, Carpal tunnel syndrome, Depression, GERD (gastroesophageal reflux disease), Heart murmur, Hyperlipidemia, Hypertension, Myocardial infarction [facility](HCC), PAD (peripheral artery disease) (HCC), and Stroke (HCC). who presents with lower extremity tremors and progressive inability to walk. Hx obtained from patient and daughter over the phone.   Patient "began going downhill" per daughter last summer when she had her first stroke. 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 She is only able to differentiate between light and dark in both eyes. Around the same time she began having postural tremors where her legs would begin to shake upon standing. Per daughter the tremors are only triggered upon standing and have been of increasing severity such that she initially required a walker and over the past week has been essentially unable to walk at all. Per PT she is now requiring a 2 person assist. She was uptitrated on lamigine to current dose of 50mg  bid by her outpatient neurologist Dr. Malvin JohnsPotter but this has seemed to have no benefit. She additionally has bouts of confusion but these do not seem to be associated with the tremor episodes. She had an EEG in clinic that was negative and was scheduled for a 3 hr EEG but this has not yet been performed. She was most recently seen by neurology on 01/01/21 and was also scheduled for an EMG of her BLE but that has not been performed yet either.   MRI brain wwo contrast 01/07/21 Motion degraded  examination, as described and limiting evaluation.   Redemonstrated chronic cortical/subcortical infarcts within the bilateral frontal, parietal, occipital and left temporal lobes.   A subcentimeter focus of diffusion weighted hyperintensity within the left occipital lobe infarction territory is favored secondary to chronic hemosiderin deposition or cortical laminar necrosis at this site, rather than superimposed acute infarct.   Redemonstrated chronic lacunar infarcts within the bilateral basal ganglia, thalami and cerebellar hemispheres.   Background moderate cerebral white matter chronic small vessel ischemic disease, and mild generalized parenchymal atrophy.  CNS imaging personally reviewed; I agree with above interpretation  MRI T and L spine wwo contrast although this hospitalization showed multilevel mild canal and neuroforaminal stenosis but revealed no etiology of her ability to walk   ROS   Per HPI; all other systems reviewed and are negative  Past History   Past Medical History:  Diagnosis Date  . Anxiety   . Arthritis   . Barrett's syndrome   . Carpal tunnel syndrome   . Depression   . GERD (gastroesophageal reflux disease)   . Heart murmur   . Hyperlipidemia   . Hypertension   . Myocardial infarction (HCC)    08  . PAD (peripheral artery disease) (HCC)   . Stroke [facility](HCC)    Past Surgical History:  Procedure Laterality Date  . ABDOMINAL AORTOGRAM W/LOWER EXTREMITY N/A 07/28/2019   Procedure: ABDOMINAL AORTOGRAM W/LOWER EXTREMITY;  Surgeon: Iran OuchArida, Muhammad A, MD;  Location: [facility] INV[facility] LAB;  Service: Cardiovascular;  Laterality: N/A;  . ABDOMINAL HYSTERECTOMY    . ARTERY BIOPSY Right 07/12/2019   Procedure: BIOPSY TEMPORAL ARTERY;  Surgeon: Henrene Dodge, MD;  Location: [facility] ORS;  Service: General;  Laterality: Right;  . BREAST BIOPSY Left 2007   Neg- [place]  . CARDIAC CATHETERIZATION    . CARDIAC VALVE REPLACEMENT    . CHOLECYSTECTOMY    . COLON SURGERY     . COLONOSCOPY WITH PROPOFOL N/A 04/09/2019   Procedure: COLONOSCOPY WITH PROPOFOL;  Surgeon: Pasty Spillers, MD;  Location: [facility] ENDOSCOPY;  Service: Endoscopy;  Laterality: N/A;  . CORONARY ANGIOPLASTY WITH STENT PLACEMENT  2008   [place]; [facility] Stent placement x 1   . DENTAL SURGERY    . ESOPHAGOGASTRODUODENOSCOPY (EGD) WITH PROPOFOL N/A 04/09/2019   Procedure: ESOPHAGOGASTRODUODENOSCOPY (EGD) WITH PROPOFOL;  Surgeon: Pasty Spillers, MD;  Location: [facility] ENDOSCOPY;  Service: Endoscopy;  Laterality: N/A;  . EXCISIONAL HEMORRHOIDECTOMY    . EYE SURGERY    . FEMORAL-POPLITEAL BYPASS GRAFT Right 08/02/2019   Procedure: BYPASS GRAFT FEMORAL-POPLITEAL ARTERY;  Surgeon: Larina Earthly, MD;  Location: [facility] OR;  Service: Vascular;  Laterality: Right;  . HERNIA REPAIR    . LOWER EXTREMITY ANGIOGRAPHY Left 11/11/2019   Procedure: LOWER EXTREMITY ANGIOGRAPHY;  Surgeon: Annice Needy, MD;  Location: [facility] INVASIVE CV LAB;  Service: Cardiovascular;  Laterality: Left;  Marland Kitchen MULTIPLE TOOTH EXTRACTIONS    . TONSILLECTOMY     Family History  Adopted: Yes  Problem Relation Age of Onset  . Breast cancer Mother 42   Social History   Socioeconomic History  . Marital status: Married    Spouse name: Not on file  . Number of children: Not on file  . Years of education: Not on file  . Highest education level: Not on file  Occupational History  . Not on file  Tobacco Use  . Smoking status: Light Smoker    Packs/day: 0.25    Years: 3.00    Pack years: 0.75    Types: Cigarettes  . Smokeless tobacco: Never  . Tobacco comments:    " 3 cigarettes per day "-1 pack per week  Vaping Use  . Vaping Use: Never used  Substance and Sexual Activity  . Alcohol use: Never  . Drug use: Never  . Sexual activity: Yes  Other Topics Concern  . Not on file  Social History Narrative  . Not on file   Social Determinants of Health   Financial Resource Strain: High Risk  . Difficulty of  Paying Living Expenses: Hard  Food Insecurity: No Food Insecurity  . Worried About Programme researcher, broadcasting/film/video in the Last Year: Never true  . Ran Out of Food in the Last Year: Never true  Transportation Needs: No Transportation Needs  . Lack of Transportation (Medical): No  . Lack of Transportation (Non-Medical): No  Physical Activity: Inactive  . Days of Exercise per Week: 0 days  . Minutes of Exercise per Session: 0 min  Stress: Stress Concern Present  . Feeling of Stress : Rather much  Social Connections: Moderately Isolated  . Frequency of Communication with Friends and Family: More than three times a week  . Frequency of Social Gatherings with Friends and Family: More than three times a week  . Attends Religious Services: Never  . Active Member of Clubs or Organizations: No  . Attends Banker Meetings: Never  . Marital Status: Married   No Known Allergies  Medications  Medications Prior to Admission  Medication Sig Dispense Refill Last Dose  . amLODipine (NORVASC) 10 MG tablet Take 1 tablet (10 mg total) by mouth daily. 90 tablet 1 01/07/2021 at 0800  . aspirin EC 81 MG tablet Take 81 mg by mouth daily. Swallow whole.   01/07/2021 at 0800  . buPROPion (WELLBUTRIN SR) 150 MG 12 hr tablet Take 1 tablet (150 mg total) by mouth 2 (two) times daily. 60 tablet 2 01/07/2021 at 0800  . citalopram (CELEXA) 40 MG tablet Take 1 tablet (40 mg total) by mouth daily. 30 tablet 0 01/07/2021 at 0800  . clonazePAM (KLONOPIN) 0.5 MG tablet Take 0.25 mg by mouth daily as needed for tremors.   01/07/2021 at 0800  . clopidogrel (PLAVIX) 75 MG tablet Take 1 tablet (75 mg total) by mouth daily. 30 tablet 11 01/07/2021 at 0800  . donepezil (ARICEPT) 5 MG tablet Take 5 mg by mouth daily.   01/06/2021 at 2000  . fenofibrate micronized (LOFIBRA) 200 MG capsule TAKE 1 CAPSULE(200 MG) BY MOUTH DAILY 90 capsule 1 01/07/2021 at 0800  . isosorbide mononitrate (IMDUR) 60 MG 24 hr tablet Take 1 tablet (60 mg  total) by mouth in the morning and at bedtime. 180 tablet 1 01/07/2021 at 0800  . lamoTRIgine (LAMICTAL) 25 MG tablet Take 50 mg by mouth 2 (two) times daily.   01/07/2021 at 0800  . lisinopril (ZESTRIL) 40 MG tablet TAKE 1 TABLET(40 MG) BY MOUTH TWICE DAILY 180 tablet 1 01/07/2021 at 0800  . montelukast (SINGULAIR) 10 MG tablet Take 10 mg by mouth daily.   01/06/2021 at 2000  . nortriptyline (PAMELOR) 50 MG capsule Take 50 mg by mouth at bedtime.   01/06/2021 at 2000  . omeprazole (PRILOSEC) 40 MG capsule TAKE 1 CAPSULE BY MOUTH TWICE DAILY (Patient taking differently: Take 40 mg by mouth 2 (two) times daily. TAKE 1 CAPSULE BY MOUTH TWICE DAILY) 180 capsule 1 01/07/2021 at 0800  . pregabalin (LYRICA) 150 MG capsule Take 1 capsule (150 mg total) by mouth 2 (two) times daily. 60 capsule 3 01/07/2021 at 0800  . rosuvastatin (CRESTOR) 40 MG tablet Take 1 tablet (40 mg total) by mouth daily. 90 tablet 3 01/06/2021 at 2000  . traZODone (DESYREL) 100 MG tablet Take 100 mg by mouth at bedtime. Take along with one 50 mg tablet for total 150 mg at bedtime   01/06/2021 at 2000  . traZODone (DESYREL) 50 MG tablet Take 1 tablet (50 mg total) by mouth at bedtime. (Patient taking differently: Take 50 mg by mouth at bedtime. Take along with one 100 mg tablet for total 150 mg at bedtime) 90 tablet 1 01/06/2021 at 2000  . aspirin 325 MG tablet Take 325 mg by mouth daily.     . B-D TB SYRINGE 1CC/27GX1/2" 27G X 1/2" 1 ML MISC USE AS DIRECTED EVERY 30 DAYS     . cyanocobalamin (,VITAMIN B-12,) 1000 MCG/ML injection ADMINISTER 1 ML(1000 MCG) IN THE MUSCLE EVERY 30 DAYS (Patient taking differently: Inject 1,000 mcg into the muscle every 30 (thirty) days.) 1 mL 12 12/04/2020  . diclofenac Sodium (VOLTAREN) 1 % GEL APPLY 4 GRAMS EXTERNALLY TO THE AFFECTED AREA FOUR TIMES DAILY (Patient taking differently: Apply 4 g topically 4 (four) times daily.) 100 g 0 prn at prn  . famotidine (PEPCID) 20 MG tablet TAKE 1 TABLET(20 MG) BY MOUTH AT  BEDTIME (Patient not taking: Reported on 01/07/2021) 90 tablet 1 Not Taking  . fluticasone (FLONASE) 50 MCG/ACT  nasal spray Place 1-2 sprays into both nostrils daily.   prn at prn  . hydrochlorothiazide (HYDRODIURIL) 25 MG tablet Take 25 mg by mouth daily. (Patient not taking: Reported on 01/07/2021)   Not Taking  . Insulin Syringe 27G X 1/2" 1 ML MISC 1 each by Does not apply route every 30 (thirty) days. 10 each 3   . lidocaine (LIDODERM) 5 % 2 patches daily.   prn at prn  . loperamide (IMODIUM) 2 MG capsule Take 2 mg by mouth as needed for diarrhea or loose stools.   prn at prn  . nicotine (NICODERM CQ) 21 mg/24hr patch Place 1 patch (21 mg total) onto the skin daily. 28 patch 0 prn at prn  . nitroGLYCERIN (NITROSTAT) 0.4 MG SL tablet Place 1 tablet (0.4 mg total) under the tongue every 5 (five) minutes as needed for chest pain. 25 tablet 3 prn at prn  . nystatin ointment (MYCOSTATIN) Apply 1 application topically 2 (two) times daily. (Patient not taking: Reported on 01/07/2021) 30 g 0 Not Taking  . ondansetron (ZOFRAN) 4 MG tablet Take 1 tablet (4 mg total) by mouth every 6 (six) hours as needed for nausea or vomiting. (Patient not taking: No sig reported) 90 tablet 1 Not Taking  . oxyCODONE-acetaminophen (PERCOCET/ROXICET) 5-325 MG tablet Take 1 tablet by mouth 4 (four) times daily as needed. (Patient not taking: Reported on 01/07/2021)   Not Taking  . pregabalin (LYRICA) 75 MG capsule Take 75 mg by mouth 2 (two) times daily.        Vitals   Vitals:   01/09/21 2000 01/09/21 2028 01/09/21 2349 01/10/21 0447  BP:  (!) 145/57 (!) 134/55 (!) 150/55  Pulse:  64 68 72  Resp:  16 17 16   Temp:  98 F (36.7 C) 98.2 F (36.8 C) 97.6 F (36.4 C)  TempSrc:   Oral   SpO2: 92% 99%  95%  Weight:      Height:         Body mass index is 29.52 kg/m.  Physical Exam   Physical Exam Gen: A&O x4, NAD HEENT: Atraumatic, normocephalic;mucous membranes moist; oropharynx clear, tongue without atrophy or  fasciculations. Neck: Supple, trachea midline. Resp: CTAB, no w/r/r CV: RRR, no m/g/r; nml S1 and S2. 2+ symmetric peripheral pulses. Abd: soft/NT/ND; nabs x 4 quad Extrem: Nml bulk; no cyanosis, clubbing, or edema.  Neuro: *MS: A&O x4. Follows multi-step commands.  *Speech: fluid, nondysarthric, able to name and repeat *CN:    I: Deferred   II,III: PERRLA, unable to differentiate between 2 fingers in any quadrant of either eye, chronic   III,IV,VI: EOMI w/o nystagmus, no ptosis   V: Sensation intact from V1 to V3 to LT   VII: Eyelid closure was full.  R UMN facial droop   VIII: Hearing intact to voice   IX,X: Voice normal, palate elevates symmetrically    XI: SCM/trap 5/5 bilat   XII: Tongue protrudes midline, no atrophy or fasciculations   *Motor:   Normal bulk.  No tremor, rigidity or bradykinesia. RUE pronator drift. 4/5 BUE, anti-gravity LLE, some movement against gravity RLE *Sensory: Intact to light touch, pinprick, temperature vibration throughout. Symmetric. Propioception intact bilat.  No double-simultaneous extinction.  *Coordination:  UTA 2/2 cortical blindness *Reflexes:  2+ and symmetric throughout without clonus; toes down-going bilat *Gait: deferred 2/2 requiring 2 person assist    Labs   CBC:  Recent Labs  Lab 01/08/21 0602 01/09/21 0808  WBC 5.0 4.9  NEUTROABS  --  3.1  HGB 12.9 13.4  HCT 39.9 41.0  MCV 84.2 84.2  PLT 162 168    Basic Metabolic Panel:  Lab Results  Component Value Date   NA 142 01/10/2021   K 3.6 01/10/2021   CO2 27 01/10/2021   GLUCOSE 134 (H) 01/10/2021   BUN 26 (H) 01/10/2021   CREATININE 1.17 (H) 01/10/2021   CALCIUM 9.3 01/10/2021   GFRNONAA 50 (L) 01/10/2021   GFRAA 69 09/12/2020   Lipid Panel:  Lab Results  Component Value Date   LDLCALC 50 09/12/2020   HgbA1c:  Lab Results  Component Value Date   HGBA1C 6.2 09/12/2020   Urine Drug Screen: No results found for: LABOPIA, COCAINSCRNUR, LABBENZ, AMPHETMU, THCU,  LABBARB  Alcohol Level No results found for: ETH   Impression   75 yo woman with recurrent strokes resulting in multifocal weakness and cortical blindness with postural tremors and progressive inability to walk since Nov and discrete bouts of confusion unrelated to tremors. It is difficult to characterize the tremors without seeing them and I plan to attend her PT session tomorrow to see if we can elicit them with assisted gait. I do not favor these to be seizures, although her discrete bouts of confusion may be related to seizures, particularly given the amount of cortical damage she has suffered from recent strokes. She has no e/o cord compression on MRI T and L spine wwo contrast. I suspect the tremors are postural in nature, although the degree of abnl movement she describes could be related to damage to the basal ganglia (ie ballismus) although that would be unlikely to be triggered reliably by walking.  Recommendations   - No medication changes today - I will attend her PT session tomorrow and make further recommendations upon visual evaluation of tremors and gait. She would likely benefit from EMG BLE but we are not able to perform that in-house at [facility]  Will continue to follow ______________________________________________________________________   Thank you for the opportunity to take part in the care of this patient. If you have any further questions, please contact the neurology consultation attending.  Signed,  Bing Neighbors, MD [facility] [phone]  If 7pm- 7am, please page neurology on call as listed in AMION.

## 2021-01-10 NOTE — Progress Notes (Signed)
Attempted 3 times to do the NIF and pt. Was asleep all times.

## 2021-01-11 LAB — BASIC METABOLIC PANEL
Anion gap: 8 (ref 5–15)
BUN: 34 mg/dL — ABNORMAL HIGH (ref 8–23)
CO2: 25 mmol/L (ref 22–32)
Calcium: 9.6 mg/dL (ref 8.9–10.3)
Chloride: 108 mmol/L (ref 98–111)
Creatinine, Ser: 1.19 mg/dL — ABNORMAL HIGH (ref 0.44–1.00)
GFR, Estimated: 49 mL/min — ABNORMAL LOW (ref >60)
Glucose, Bld: 122 mg/dL — ABNORMAL HIGH (ref 70–99)
Potassium: 3.9 mmol/L (ref 3.5–5.1)
Sodium: 141 mmol/L (ref 135–145)

## 2021-01-11 LAB — VITAMIN B1: Vitamin B1 (Thiamine): 135.2 nmol/L (ref 66.5–200.0)

## 2021-01-11 NOTE — Progress Notes (Signed)
PROGRESS NOTE    Tara [name]  IRJ:[id number] DOB: 18-Oct-1945 DOA: 01/07/2021 PCP: Dorcas Carrow, DO (Confirm with patient/family/NH records and if not entered, this HAS to be entered at [facility] point of entry. 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 Now presented with bilateral leg weakness and numbness since Friday. Also reports frequent falls due to losing balance.   Currently plan is further work up for patient's frequent tremors and bilateral leg weakness     Assessment & Plan:   Active Problems:   Anxiety   HTN (hypertension)   Hyperlipidemia   IBS (irritable bowel syndrome)   History of aortic valve replacement   B12 deficiency   GERD (gastroesophageal reflux disease)   PAD (peripheral artery disease) (HCC)   Difficulty sleeping   History of stroke   Tobacco abuse   TIA (transient ischemic attack)   Tremors of nervous system   Weakness   Acute respiratory failure with hypoxia and hypercarbia (HCC)   Fall   Lower extremity edema   Seizure-like activity (HCC)   1 bilateral lower extremity weakness and tremors/frequent falls  -Questionable etiology. -Head CT/MRI brain with no acute abnormalities. -MRI brain with chronic cortical/subcortical infarcts within the bilateral frontal, parietal, occipital, left temporal lobes.  Subcentimeter focus of diffusion weighted hyperintensity within the left occipital brain infarct territories.  Secondary to chronic hemosiderin deposition or cortical laminar necrosis at the site, rather than superimposed acute infarct. -TSH within normal limits.  Vitamin B1  stable. -Lower extremity Dopplers negative for DVT. -Patient noted to have low vitamin B12 levels which are being repleted. -Per Dr. Allena Katz daughter stated weakness in legs have been progressive over the last few months and symptoms started primarily with pain after her vascular surgery intervention and subsequently started having tremors which leads to the falls. -Tremors noted to have progressed to the point where patient has become immobile and wheelchair-bound leading to deconditioning. -Patient noted to have gone through multiple PT sessions and evaluations and therapy. -Wellbutrin noted to have been recently started for smoking cessation. -Patient noted to have been on nortriptyline for neuropathic pain, trazodone as needed for sleep, Celexa for depression.  Trazodone as needed for insomnia. -Patient noted to be started on Lyrica per neurologist for neuropathic pain. -Lamictal was started due to concern for associated seizure-like events. -Patient's neurologist in the outpatient setting noted to be weaning down Klonopin which was also started due to concern for seizure-like events leading to tremor. -Patient seen in consultation by neurology who assessed patient during PT therapy to witness that tremors and is noted per neurology that patient had sustained clonus in the left Achilles upon weightbearing while abating with changing position of her foot and it is noted that when clonus was activated patient became more anxious with further loss of her balance and per neurology these definitely not due to seizure-like activity.   -Per neurology recommending SNF as patient not safe to go home at this point and recommending continuation of current dose of benzodiazepine with very slow benzo wean followed by lamotrigine wean 1 week after benzo have been discontinued.   -Neurology recommending lamotrigine wean as follows : -Week 1: 25 mg every morning +  50 mg qPM -Week 2: 25 mg twice daily -Week 3: 25  mg qAM -Week 4: Off. -Appreciate neurology input and recommendations.  2.  History of seizure-like activity -Patient being followed in the outpatient setting by Dr. Malvin Johns. -Patient noted to have been scheduled for 3-hour EEG. -EEG done on 12/03/2020 within normal limits.  3.  Possible OSA -CPAP nightly.   -Will need outpatient sleep study.  4.  Chronic back pain and knee pain -Patient noted to have reported to Dr. Allena Katz that she does not currently take Percocets however had requested Percocet. -Continue Naprosyn due to concerns for respiratory depression.  5.  Tremors. -Continue Lamictal 50 mg twice daily. -Outpatient follow-up with primary neurologist.  6.  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 -Continue Plavix, aspirin for secondary prevention  -Outpatient follow-up with cardiology.  7.  Bilateral lower extremity edema -Improved with a dose of IV Lasix.    8.  Mildly elevated renal dysfunction -Follow with diuresis.  9.  Vitamin B12 deficiency -Continue vitamin B12 supplementation.     DVT prophylaxis: Lovenox Code Status: Partial Family Communication: Updated daughter, Tara [name] who was on speaker phone at time of interview.  Disposition:   Status is: Inpatient  Remains inpatient appropriate because:Inpatient level of care appropriate due to severity of illness  Dispo: The patient is from: Home              Anticipated d/c is to:  TBD              Patient currently is not medically stable to d/c.   Difficult to place patient No       Consultants:  Neurology: Dr. Selina Cooley 01/09/2021  Procedures:  Plain films of the left shoulder 01/08/2021 Chest x-ray 01/07/2021 Plain films of the pelvis and left hip 01/08/2021 MRI of the T and L-spine 01/08/2021 MRI brain 01/07/2021 Lower extremity Doppler 01/07/2021   Antimicrobials:  None   Subjective: Patient alert on the phone with her daughter.  Denies any  chest pain or shortness of breath.  No abdominal pain.  Overall feeling better and in agreement with SNF placement for ongoing therapy.    Objective: Vitals:   01/11/21 0002 01/11/21 0443 01/11/21 0752 01/11/21 1200  BP: 104/68 (!) 141/62 (!) 142/55 (!) 152/55  Pulse: 80 70 75 70  Resp: 20 20 16 16   Temp: 97.9 F (36.6 C) 97.9 F (36.6 C) 97.8 F (36.6 C) 97.8 F (36.6 C)  TempSrc: Oral Oral  Oral  SpO2: 91% 91% 91% 90%  Weight:      Height:        Intake/Output Summary (Last 24 hours) at 01/11/2021 1225 Last data filed at 01/11/2021 1035 Gross per 24 hour  Intake 120 ml  Output --  Net 120 ml    Filed Weights   01/07/21 1343  Weight: 78 kg    Examination:  General exam: NAD Respiratory system: CTA B.  No wheezes, no crackles, no rhonchi.  Normal respiratory effort.  Cardiovascular system: RRR with 3/6 systolic ejection murmur right upper sternal border.  No JVD, no murmurs rubs or gallops.  No lower extremity edema. Gastrointestinal system: Abdomen is soft, nontender, nondistended, positive bowel sounds.  No rebound.  No guarding.  Central nervous system: Alert and oriented.  Moving extremities spontaneously.  Extremities: Symmetric 5 x 5 power. Skin: No rashes, lesions or ulcers Psychiatry: Judgement and insight fair.  Mood and affect appropriate.  Data Reviewed: I have personally reviewed following labs and imaging studies  CBC: Recent Labs  Lab 01/07/21 1348 01/08/21 0602 01/09/21 0808  WBC 6.5 5.0 4.9  NEUTROABS  --   --  3.1  HGB 13.1 12.9 13.4  HCT 39.8 39.9 41.0  MCV 85.6 84.2 84.2  PLT 166 162 168     Basic Metabolic Panel: Recent Labs  Lab 01/07/21 1348 01/08/21 0602 01/09/21 0808 01/10/21 0540 01/11/21 0845  NA 140 142 140 142 141  K 3.5 3.9 3.7 3.6 3.9  CL 110 110 108 107 108  CO2 23 26 25 27 25   GLUCOSE 125* 125* 97 134* 122*  BUN 30* 31* 22 26* 34*  CREATININE 1.03* 1.13* 0.89 1.17* 1.19*  CALCIUM 9.3 9.1 9.0 9.3 9.6  MG  --    --   --  2.1  --      GFR: Estimated Creatinine Clearance: 43.2 mL/min (A) (by C-G formula based on SCr of 1.19 mg/dL (H)).  Liver Function Tests: Recent Labs  Lab 01/07/21 1348 01/09/21 0808  AST 24 22  ALT 17 16  ALKPHOS 51 52  BILITOT 0.9 0.6  PROT 7.7 6.8  ALBUMIN 4.6 3.8     CBG: No results for input(s): GLUCAP in the last 168 hours.   Recent Results (from the past 240 hour(s))  Urine culture     Status: Abnormal   Collection Time: 01/07/21  1:50 PM   Specimen: Urine, Random  Result Value Ref Range Status   Specimen Description   Final    URINE, RANDOM Performed at [facility], [address] [place], [place] [address]    Special Requests   Final    NONE Performed at [facility], [address] [place], [place] [address]    Culture (A)  Final    30,000 COLONIES/mL MULTIPLE SPECIES PRESENT, SUGGEST RECOLLECTION   Report Status 01/09/2021 FINAL  Final  Resp Panel by RT-PCR (Flu A&B, Covid) Nasopharyngeal Swab     Status: None   Collection Time: 01/07/21  5:06 PM   Specimen: Nasopharyngeal Swab; Nasopharyngeal(NP) swabs in vial transport medium  Result Value Ref Range Status   SARS Coronavirus 2 by RT PCR NEGATIVE NEGATIVE Final    Comment: (NOTE) SARS-CoV-2 target nucleic acids are NOT DETECTED.  The SARS-CoV-2 RNA is generally detectable in upper respiratory specimens during the acute phase of infection. The lowest concentration of SARS-CoV-2 viral copies this assay can detect is 138 copies/mL. A negative result does not preclude SARS-Cov-2 infection and should not be used as the sole basis for treatment or other patient management decisions. A negative result may occur with  improper specimen collection/handling, submission of specimen other than nasopharyngeal swab, presence of viral mutation(s) within the areas targeted by this assay, and inadequate number of viral copies(<138 copies/mL). A negative result must be combined  with clinical observations, patient history, and epidemiological information. The expected result is Negative.  Fact Sheet for Patients:  BloggerCourse.comhttps://www.fda.gov/media/152166/download  Fact Sheet for Healthcare Providers:  SeriousBroker.ithttps://www.fda.gov/media/152162/download  This test is no t yet approved or cleared by the [place] FDA and  has been authorized for detection and/or diagnosis of SARS-CoV-2 by FDA under an Emergency Use Authorization (EUA). This EUA will remain  in effect (meaning this test can be used) for the duration of the COVID-19 declaration under Section 564(b)(1) of the Act, 21 U.S.C.section 360bbb-3(b)(1), unless the authorization is terminated  or revoked sooner.       Influenza  A by PCR NEGATIVE NEGATIVE Final   Influenza B by PCR NEGATIVE NEGATIVE Final    Comment: (NOTE) The Xpert Xpress SARS-CoV-2/FLU/RSV plus assay is intended as an aid in the diagnosis of influenza from Nasopharyngeal swab specimens and should not be used as a sole basis for treatment. Nasal washings and aspirates are unacceptable for Xpert Xpress SARS-CoV-2/FLU/RSV testing.  Fact Sheet for Patients: BloggerCourse.com  Fact Sheet for Healthcare Providers: SeriousBroker.it  This test is not yet approved or cleared by the [place] FDA and has been authorized for detection and/or diagnosis of SARS-CoV-2 by FDA under an Emergency Use Authorization (EUA). This EUA will remain in effect (meaning this test can be used) for the duration of the COVID-19 declaration under Section 564(b)(1) of the Act, 21 U.S.C. section 360bbb-3(b)(1), unless the authorization is terminated or revoked.  Performed at [facility], [address] [place], [place] [address]           Radiology Studies: No results found.      Scheduled Meds:  amLODipine  10 mg Oral Daily   aspirin EC  81 mg Oral Daily   citalopram  40 mg Oral Daily    clonazePAM  0.25 mg Oral Daily   clopidogrel  75 mg Oral Daily   cyanocobalamin  1,000 mcg Subcutaneous Daily   donepezil  5 mg Oral Daily   enoxaparin (LOVENOX) injection  40 mg Subcutaneous Q24H   lamoTRIgine  50 mg Oral BID   lisinopril  40 mg Oral Daily   montelukast  10 mg Oral QPM   nicotine  21 mg Transdermal Daily   nortriptyline  50 mg Oral QHS   pantoprazole  40 mg Oral Daily   pregabalin  75 mg Oral BID   rosuvastatin  40 mg Oral QHS   sodium chloride flush  3 mL Intravenous Q12H   Continuous Infusions:   LOS: 4 days    Time spent: 35 minutes    Ramiro Harvest, MD [facility]   To contact the attending provider between 7A-7P or the covering provider during after hours 7P-7A, please log into the web site [email or web address] and access using universal [place] password for that web site. If you do not have the password, please call the hospital operator.  01/11/2021, 12:25 PM

## 2021-01-11 NOTE — Progress Notes (Addendum)
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  Subjective: NAEON I attended patient's PT session this evening to witness her tremors. She had sustained clonus in her L achilles upon weight bearing which abated with changing the position of her foot. When the clonus is activated patient becomes anxious and further loses her balance.  Objective  Vitals:   01/11/21 0443 01/11/21 0752  BP: (!) 141/62 (!) 142/55  Pulse: 70 75  Resp: 20 16  Temp: 97.9 F (36.6 C) 97.8 F (36.6 C)  SpO2: 91% 91%   Physical Exam Gen: A&O x4, NAD HEENT: Atraumatic, normocephalic;mucous membranes moist; oropharynx clear, tongue without atrophy or fasciculations. Neck: Supple, trachea midline. Resp: CTAB, no w/r/r CV: RRR, no m/g/r; nml S1 and S2. 2+ symmetric peripheral pulses. Abd: soft/NT/ND; nabs x 4 quad Extrem: Nml bulk; no cyanosis, clubbing, or edema.   Neuro: *MS: A&O x4. Follows multi-step commands. *Speech: fluid, nondysarthric, able to name and repeat *CN:   I: Deferred   II,III: PERRLA, unable to differentiate between 2 fingers in any quadrant of either eye, chronic   III,IV,VI: EOMI w/o nystagmus, no ptosis   V: Sensation intact from V1 to V3 to LT   VII: Eyelid closure was full.  R UMN facial droop   VIII: Hearing intact to voice   IX,X: Voice normal, palate elevates symmetrically   XI: SCM/trap 5/5 bilat   XII: Tongue protrudes midline, no atrophy or fasciculations   *Motor:   Normal bulk.  No tremor, rigidity or bradykinesia. RUE pronator drift. 4/5 BUE, anti-gravity LLE, some movement against gravity RLE *Sensory: Intact to light touch, pinprick, temperature vibration throughout. Symmetric. Propioception intact bilat.  No double-simultaneous extinction. *Coordination:  UTA 2/2  cortical blindness *Reflexes:  3+ and symmetric throughout with sustained clonus LLE and unsustained RLE; toes up on L equiv on R *Gait: I attended patient's PT session this evening to witness her tremors. She had sustained clonus in her L achilles upon weight bearing which abated with changing the position of her foot. When the clonus is activated patient becomes anxious and further loses her balance.  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   I attended patient's PT session this evening to witness her tremors. She had sustained clonus in her L achilles upon weight bearing which abated with changing the position of her foot. When the clonus is activated patient becomes anxious and further loses her balance. Therefore technically these are not tremors but clonus activated by resistance against the achilles tendons. These are definitively not seizures although seizures as a cause of her recurrent bouts of confusion at home cannot be ruled out.  I had an extensive conversation with daughter Lawson Fiscal over the phone about this. The best therapy at this point is continued PT and stress mgmt. When pt's clonus is activated she becomes anxious and even more off balance. 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 I think she would certainly benefit with continued rehab at SNF as she is not safe to go home at this point. Regarding her medications I would continue v slow benzodiazepine wean followed by lamotrigine wean as follows 1 week after benzo dose is  d/c'd or stabilized:  Lamotrigine current dose 50mg  bid  Wean schedule:  Week 1: 25mg  qAM + 50mg  qPM Week 2: 25mg  bid Week 3: 25mg  qAM Week 4: off  I would not push too hard to wean her benzodiazepine to off as an inpatient if this worsens her anxiety as this is already  making her rehab more difficult.  After discharge she may f/u with Dr. and obtain the studies that he ordered including EMG BLE and 3 hr EEG that we do not have the capacity to perform inpatient at [facility].  Neurology to sign off, but please re-engage if additional questions arise.  , MD [facility] [phone]  If 7pm- 7am, please page neurology on call as listed in AMION.

## 2021-01-11 NOTE — Care Management Important Message (Signed)
Important Message  Patient Details  Name: Tara [name] MRN: [id number] Date of Birth: [birth year]   Medicare Important Message Given:  Yes     Olegario Messier [name] 01/11/2021, 10:43 AM

## 2021-01-11 NOTE — TOC Progression Note (Signed)
Transition of Care [facility]) - Progression Note    Patient Details  Name: Tara [name] MRN: [id number] Date of Birth: 1945-10-21  [facility]) CM/SW Contact  Allayne Butcher, RN Phone Number: 01/11/2021, 11:19 AM  Clinical Narrative:    Patient's daughter agrees with SNF for rehab.  She would like for the patient to go to [facility].  RNCM has sent a bed request to [facility] and called, they will review the referral.     Expected Discharge Plan: Skilled Nursing Facility Barriers to Discharge: Continued Medical Work up  Expected Discharge Plan and Services Expected Discharge Plan: Skilled Nursing Facility       Living arrangements for the past 2 months: Single Family Home                                       Social Determinants of Health (SDOH) Interventions    Readmission Risk Interventions Readmission Risk Prevention Plan 08/03/2019  Post Dischage Appt Complete  Medication Screening Complete  Transportation Screening Complete  Some recent data might be hidden

## 2021-01-11 NOTE — TOC Progression Note (Signed)
Transition of Care [facility]) - Progression Note    Patient Details  Name: Tara [name] MRN: [id number] Date of Birth: 10-28-1945  Transition of Care [facility]) CM/SW Contact  Allayne Butcher, RN Phone Number: 01/11/2021, 1:48 PM  Clinical Narrative:     [facility] and rehab has offered a bed, bed offer accepted.  They can accept patient tomorrow.  Daughter updated that patient can go to [facility].    Expected Discharge Plan: Skilled Nursing Facility Barriers to Discharge: Continued Medical Work up  Expected Discharge Plan and Services Expected Discharge Plan: Skilled Nursing Facility       Living arrangements for the past 2 months: Single Family Home                                       Social Determinants of Health (SDOH) Interventions    Readmission Risk Interventions Readmission Risk Prevention Plan 08/03/2019  Post Dischage Appt Complete  Medication Screening Complete  Transportation Screening Complete  Some recent data might be hidden

## 2021-01-12 LAB — BASIC METABOLIC PANEL
Anion gap: 4 — ABNORMAL LOW (ref 5–15)
BUN: 39 mg/dL — ABNORMAL HIGH (ref 8–23)
CO2: 26 mmol/L (ref 22–32)
Calcium: 9 mg/dL (ref 8.9–10.3)
Chloride: 113 mmol/L — ABNORMAL HIGH (ref 98–111)
Creatinine, Ser: 1.05 mg/dL — ABNORMAL HIGH (ref 0.44–1.00)
GFR, Estimated: 56 mL/min — ABNORMAL LOW (ref >60)
Glucose, Bld: 152 mg/dL — ABNORMAL HIGH (ref 70–99)
Potassium: 3.7 mmol/L (ref 3.5–5.1)
Sodium: 143 mmol/L (ref 135–145)

## 2021-01-12 LAB — MAGNESIUM: Magnesium: 2 mg/dL (ref 1.7–2.4)

## 2021-01-12 LAB — SARS CORONAVIRUS 2 (TAT 6-24 HRS): SARS Coronavirus 2: NEGATIVE

## 2021-01-12 MED ORDER — LIDOCAINE 5 % EX PTCH: 2.0000 | MEDICATED_PATCH | Freq: Every day | CUTANEOUS | 0 refills | Status: AC

## 2021-01-12 MED ORDER — NAPROXEN 375 MG PO TABS
375.0000 mg | ORAL_TABLET | Freq: Two times a day (BID) | ORAL | Status: DC | PRN
Start: 2021-01-12 — End: 2022-01-03

## 2021-01-12 MED ORDER — DICLOFENAC SODIUM 1 % EX GEL: 4.0000 g | Freq: Four times a day (QID) | CUTANEOUS | Status: AC

## 2021-01-12 MED ORDER — BACITRACIN-NEOMYCIN-POLYMYXIN OINTMENT TUBE: 1.0000 "application " | TOPICAL_OINTMENT | CUTANEOUS | Status: AC | PRN

## 2021-01-12 MED ORDER — BACITRACIN-NEOMYCIN-POLYMYXIN OINTMENT TUBE
TOPICAL_OINTMENT | Freq: Every day | CUTANEOUS | Status: DC
  Filled 2021-01-12: qty 14.17

## 2021-01-12 MED ORDER — NAPROXEN 375 MG PO TABS
375.0000 mg | ORAL_TABLET | Freq: Two times a day (BID) | ORAL | Status: DC | PRN
  Administered 2021-01-12: 375 mg via ORAL
  Filled 2021-01-12 (×2): qty 1

## 2021-01-12 MED ORDER — TRAZODONE HCL 50 MG PO TABS
50.0000 mg | ORAL_TABLET | Freq: Every day | ORAL | Status: DC
Start: 2021-01-12 — End: 2021-02-09

## 2021-01-12 MED ORDER — CLONAZEPAM 0.5 MG PO TABS: 0.2500 mg | ORAL_TABLET | Freq: Every day | ORAL | 0 refills | Status: DC | PRN

## 2021-01-12 MED ORDER — CYANOCOBALAMIN 1000 MCG/ML IJ SOLN: 1000.0000 ug | INTRAMUSCULAR | Status: DC

## 2021-01-12 MED ORDER — OMEPRAZOLE 40 MG PO CPDR: 40.0000 mg | DELAYED_RELEASE_CAPSULE | Freq: Two times a day (BID) | ORAL | Status: DC

## 2021-01-12 NOTE — Discharge Summary (Signed)
Physician Discharge Summary  Tara [name] ZOX:[id number] DOB: 06/07/1946 DOA: 01/07/2021  PCP: Dorcas CarrowJohnson, Megan P, DO  Admit date: 01/07/2021 Discharge date: 01/12/2021  Time spent: 60 minutes  Recommendations for Outpatient Follow-up:  Follow-up with MD at skilled nursing facility.  Patient will need a basic metabolic profile done in 1 week to follow-up on electrolytes and renal function. Follow-up with Dr. Malvin JohnsPotter, neurology in 2 weeks.  On follow-up patient's klonopin weaning will need to be followed up upon as well as recommendations in-house neurology for Lamictal weaning 1 week after benzodiazepine has been discontinued.  Please see hospital course #1 for neurologist recommendations on Lamictal weaning.  Patient will also need evaluation for possible EMG bilateral lower extremity and 3-hour EEG as recommended per in-house neurologist.   Discharge Diagnoses:  Active Problems:   Anxiety   HTN (hypertension)   Hyperlipidemia   IBS (irritable bowel syndrome)   History of aortic valve replacement   B12 deficiency   GERD (gastroesophageal reflux disease)   PAD (peripheral artery disease) (HCC)   Difficulty sleeping   History of stroke   Tobacco abuse   TIA (transient ischemic attack)   Tremors of nervous system   Weakness   Acute respiratory failure with hypoxia and hypercarbia (HCC)   Fall   Lower extremity edema   Seizure-like activity (HCC)   Discharge Condition: Stable and improved  Diet recommendation: Heart healthy  Filed Weights   01/07/21 1343 01/12/21 0500  Weight: 78 kg 88.1 kg    History of present illness:  HPI per Dr. Janene Harveyox  Tara [name] is a 75 y.o. 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   She reported sudden onset of bilateral leg weakness and feeling like her legs are lead heavy.  These symptoms started at approximately 3 AM on day of presentation.  She was not able to return to sleep after this and did not have any p.o. intake after this.  She also experienced difficulty ambulating at this time.   She denied chest pain, shortness of breath, nausea, vomiting, abdominal pain, dysuria, hematuria   She denies changes to her current medications at this time.   Social history: lives at home with husband. She smokes 1/3 pack per day. She denied etoh and recreational drug use. She is retired and formerly worked for the [employer].   Vaccinations: she is not vaccinated for covid 19.   ED Course: Discussed with ED provider, patient requiring hospitalization for bilateral lower extremity weakness.  Vitals in the emergency department show temperature of 97.9, respiration rate of 21, blood pressure 157/71, SPO2 of 94% on 2 L nasal cannula.   ED VBG performed showed 7.31/79/30 9.8   CK was within normal limits, sed rate was within normal limits  Hospital Course:  1 bilateral lower extremity weakness and tremors/frequent falls  -Questionable etiology. -Head CT/MRI brain with no acute abnormalities. -MRI brain with chronic cortical/subcortical infarcts within the bilateral frontal, parietal, occipital, left temporal lobes.  Subcentimeter focus of diffusion weighted hyperintensity within the left occipital brain infarct territories.  Secondary to chronic hemosiderin deposition or cortical laminar necrosis at the site, rather than superimposed acute infarct. -TSH within normal limits.  Vitamin B1 stable. -Lower extremity Dopplers negative for DVT. -  Patient noted to have low vitamin B12 levels which are being repleted. -Per Dr. Allena Katz daughter stated weakness in legs have been  progressive over the last few months and symptoms started primarily with pain after her vascular surgery intervention and subsequently started having tremors which leads to the falls. -Tremors noted to have progressed to the point where patient has become immobile and wheelchair-bound leading to deconditioning. -Patient noted to have gone through multiple PT sessions and evaluations and therapy. -Wellbutrin noted to have been recently started for smoking cessation. -Patient noted to have been on nortriptyline for neuropathic pain, trazodone as needed for sleep, Celexa for depression.  Trazodone as needed for insomnia. -Patient noted to be started on Lyrica per neurologist for neuropathic pain. -Lamictal was started due to concern for associated seizure-like events. -Patient's neurologist in the outpatient setting noted to be weaning down Klonopin which was also started due to concern for seizure-like events leading to tremor. -Patient seen in consultation by neurology who assessed patient during PT therapy to witness that tremors and is noted per neurology that patient had sustained clonus in the left Achilles upon weightbearing while abating with changing position of her foot and it is noted that when clonus was activated patient became more anxious with further loss of her balance and per neurology these definitely not due to seizure-like activity.   -Per neurology recommending SNF as patient not safe to go home at this point and recommending continuation of current dose of benzodiazepine with very slow benzo wean followed by lamotrigine wean 1 week after benzo have been discontinued.   -Neurology recommending lamotrigine wean as follows : -Week 1: 25 mg every morning + 50 mg qPM -Week 2: 25 mg twice daily -Week 3: 25 mg qAM -Week 4: Off. -Outpatient follow-up with Dr. Malvin Johns, primary neurologist post discharge.   2.  History of seizure-like activity -Patient being followed in the outpatient  setting by Dr. Malvin Johns. -Patient noted to have been scheduled for 3-hour EEG. -EEG done on 12/03/2020 within normal limits. -No seizure-like activity noted during the hospitalization  3.  Possible OSA -CPAP nightly.   -Will need outpatient sleep study.  4.  Chronic back pain and knee pain -Patient noted to have reported to Dr. Allena Katz that she does not currently take Percocets however had requested Percocet. -Patient placed on Naprosyn twice daily as needed for pain management.   -Patient's Lidoderm patch was held and will be resumed on discharge.   -Patient will be discharged to skilled nursing facility.   5.  Tremors. -Patient was maintained on home regimen Lamictal 50 mg twice daily. -Outpatient follow-up with primary neurologist.  6.  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 -Patient maintained on Plavix, aspirin for secondary prevention  -Outpatient follow-up with cardiology.  7.  Bilateral lower extremity edema -Improved with a dose of IV Lasix.    8.  Mildly elevated renal dysfunction -Remained stable.  9.  Vitamin B12 deficiency -Patient was maintained on vitamin B12 supplementation.        Procedures: Plain films of the left shoulder 01/08/2021 Chest x-ray 01/07/2021 Plain films of the pelvis and left hip 01/08/2021 MRI of the T and L-spine 01/08/2021 MRI brain 01/07/2021 Lower extremity Doppler 01/07/2021  Consultations: Neurology: Dr. Selina Cooley 01/09/2021  Discharge Exam: Vitals:   01/12/21 0714 01/12/21 0749  BP: (!) 154/70   Pulse: 69 68  Resp: 14   Temp: (!) 97.3 F (36.3 C)  SpO2: (!) 89% 95%    General: NAD Cardiovascular: RRR with 3/6 systolic ejection murmur right upper sternal border.  No JVD, no lower extremity edema. Respiratory: Lungs clear to auscultation bilaterally.  No wheezes, no crackles, no rhonchi.  Fair air movement.  Speaking in full sentences.  Discharge  Instructions   Discharge Instructions     Diet - low sodium heart healthy   Complete by: As directed    Increase activity slowly   Complete by: As directed       Allergies as of 01/12/2021   No Known Allergies      Medication List     STOP taking these medications    buPROPion 150 MG 12 hr tablet Commonly known as: Wellbutrin SR   famotidine 20 MG tablet Commonly known as: PEPCID   hydrochlorothiazide 25 MG tablet Commonly known as: HYDRODIURIL   nystatin ointment Commonly known as: MYCOSTATIN   ondansetron 4 MG tablet Commonly known as: ZOFRAN   oxyCODONE-acetaminophen 5-325 MG tablet Commonly known as: PERCOCET/ROXICET       TAKE these medications    amLODipine 10 MG tablet Commonly known as: NORVASC Take 1 tablet (10 mg total) by mouth daily.   aspirin EC 81 MG tablet Take 81 mg by mouth daily. Swallow whole. What changed: Another medication with the same name was removed. Continue taking this medication, and follow the directions you see here.   B-D TB SYRINGE 1CC/27GX1/2" 27G X 1/2" 1 ML Misc Generic drug: TUBERCULIN SYR 1CC/27GX1/2" USE AS DIRECTED EVERY 30 DAYS   citalopram 40 MG tablet Commonly known as: CELEXA Take 1 tablet (40 mg total) by mouth daily.   clonazePAM 0.5 MG tablet Commonly known as: KLONOPIN Take 0.5 tablets (0.25 mg total) by mouth daily as needed. What changed: reasons to take this   clopidogrel 75 MG tablet Commonly known as: Plavix Take 1 tablet (75 mg total) by mouth daily.   cyanocobalamin 1000 MCG/ML injection Commonly known as: (VITAMIN B-12) Inject 1 mL (1,000 mcg total) into the muscle every 30 (thirty) days. What changed: See the new instructions.   diclofenac Sodium 1 % Gel Commonly known as: VOLTAREN Apply 4 g topically 4 (four) times daily.   donepezil 5 MG tablet Commonly known as: ARICEPT Take 5 mg by mouth daily.   fenofibrate micronized 200 MG capsule Commonly known as: LOFIBRA TAKE 1  CAPSULE(200 MG) BY MOUTH DAILY   fluticasone 50 MCG/ACT nasal spray Commonly known as: FLONASE Place 1-2 sprays into both nostrils daily.   Insulin Syringe 27G X 1/2" 1 ML Misc 1 each by Does not apply route every 30 (thirty) days.   isosorbide mononitrate 60 MG 24 hr tablet Commonly known as: IMDUR Take 1 tablet (60 mg total) by mouth in the morning and at bedtime.   lamoTRIgine 25 MG tablet Commonly known as: LAMICTAL Take 50 mg by mouth 2 (two) times daily.   lidocaine 5 % Commonly known as: LIDODERM Place 2 patches onto the skin daily. What changed: how to take this   lisinopril 40 MG tablet Commonly known as: ZESTRIL TAKE 1 TABLET(40 MG) BY MOUTH TWICE DAILY   loperamide 2 MG capsule Commonly known as: IMODIUM Take 2 mg by mouth as needed for diarrhea or loose stools.   montelukast 10 MG tablet Commonly known as: SINGULAIR Take 10 mg by mouth daily.   naproxen 375 MG tablet Commonly known as: NAPROSYN Take 1 tablet (375 mg total) by mouth 2 (two) times daily as  needed for mild pain or moderate pain.   neomycin-bacitracin-polymyxin Oint Commonly known as: NEOSPORIN Apply 1 application topically as needed for wound care.   nicotine 21 mg/24hr patch Commonly known as: Nicoderm CQ Place 1 patch (21 mg total) onto the skin daily.   nitroGLYCERIN 0.4 MG SL tablet Commonly known as: NITROSTAT Place 1 tablet (0.4 mg total) under the tongue every 5 (five) minutes as needed for chest pain.   nortriptyline 50 MG capsule Commonly known as: PAMELOR Take 50 mg by mouth at bedtime.   omeprazole 40 MG capsule Commonly known as: PRILOSEC Take 1 capsule (40 mg total) by mouth 2 (two) times daily. TAKE 1 CAPSULE BY MOUTH TWICE DAILY   pregabalin 75 MG capsule Commonly known as: LYRICA Take 75 mg by mouth 2 (two) times daily. What changed: Another medication with the same name was removed. Continue taking this medication, and follow the directions you see here.    rosuvastatin 40 MG tablet Commonly known as: CRESTOR Take 1 tablet (40 mg total) by mouth daily.   traZODone 100 MG tablet Commonly known as: DESYREL Take 100 mg by mouth at bedtime. Take along with one 50 mg tablet for total 150 mg at bedtime   traZODone 50 MG tablet Commonly known as: DESYREL Take 1 tablet (50 mg total) by mouth at bedtime. Take along with one 100 mg tablet for total 150 mg at bedtime       No Known Allergies  Contact information for follow-up providers     Morene Crocker, MD. Schedule an appointment as soon as possible for a visit in 2 week(s).   Specialty: Neurology Contact information: [phone] [facility] [address] [facility] [place] [address] [phone]         MD AT SNF Follow up.               Contact information for after-discharge care     Destination     [facility] .   Service: Skilled Nursing Contact information: [address] [place] [address] [phone]                      The results of significant diagnostics from this hospitalization (including imaging, microbiology, ancillary and laboratory) are listed below for reference.    Significant Diagnostic Studies: MR Brain W and Wo Contrast  Result Date: 01/07/2021 CLINICAL DATA:  Mental status change, unknown cause. EXAM: MRI HEAD WITHOUT AND WITH CONTRAST TECHNIQUE: Multiplanar, multiecho pulse sequences of the brain and surrounding structures were obtained without and with intravenous contrast. CONTRAST:  7.53mL GADAVIST GADOBUTROL 1 MMOL/ML IV SOLN COMPARISON:  Brain MRI 05/10/2020. FINDINGS: Brain: Intermittently motion degraded examination, limiting evaluation. Most notably, there is moderate/severe motion degradation of the axial T1 weighted postcontrast sequence, coronal T1 weighted postcontrast sequence and sagittal T1 weighted postcontrast sequence. Mild generalized cerebral and cerebellar  atrophy. Redemonstrated chronic cortical/subcortical infarcts within the bilateral frontal lobes, bilateral parietal lobes, bilateral occipital lobes and posterior left temporal lobe. Subcentimeter focus of diffusion weighted hyperintensity within the left occipital infarction territory, which is favored related to chronic hemosiderin deposition at this site rather than superimposed acute infarct. No evidence of acute infarct elsewhere. Redemonstrated chronic lacunar infarcts within the bilateral basal ganglia, thalami and cerebellar hemispheres. Background moderate cerebral white matter chronic small vessel ischemic disease. No evidence of intracranial mass. No extra-axial fluid collection. No midline shift. No abnormal intracranial enhancement. Vascular: Expected proximal arterial flow voids.  Skull and upper cervical spine: No focal marrow lesion. Sinuses/Orbits: Visualized orbits show no acute finding. No significant paranasal sinus disease. IMPRESSION: Motion degraded examination, as described and limiting evaluation. Redemonstrated chronic cortical/subcortical infarcts within the bilateral frontal, parietal, occipital and left temporal lobes. A subcentimeter focus of diffusion weighted hyperintensity within the left occipital lobe infarction territory is favored secondary to chronic hemosiderin deposition or cortical laminar necrosis at this site, rather than superimposed acute infarct. Redemonstrated chronic lacunar infarcts within the bilateral basal ganglia, thalami and cerebellar hemispheres. Background moderate cerebral white matter chronic small vessel ischemic disease, and mild generalized parenchymal atrophy. Electronically Signed   By: Jackey Loge DO   On: 01/07/2021 18:12   MR THORACIC SPINE W WO CONTRAST  Result Date: 01/09/2021 CLINICAL DATA:  Initial evaluation for acute bilateral lower extremity weakness, myelopathy, frequent falls. B12 deficiency. EXAM: MRI THORACIC AND LUMBAR SPINE WITHOUT  AND WITH CONTRAST TECHNIQUE: Multiplanar and multiecho pulse sequences of the thoracic and lumbar spine were obtained without and with intravenous contrast. CONTRAST:  7mL GADAVIST GADOBUTROL 1 MMOL/ML IV SOLN COMPARISON:  Prior radiograph from 05/27/2019. FINDINGS: MRI THORACIC SPINE FINDINGS Alignment: Physiologic with preservation of the normal thoracic kyphosis. No significant listhesis. Vertebrae: Vertebral body height maintained without acute or chronic fracture. Bone marrow signal intensity within normal limits. Subcentimeter benign hemangioma noted within the T11 vertebral body. No worrisome osseous lesions. Mild discogenic reactive endplate change noted about the partially visualized C6-7 interspace. No other abnormal marrow edema or enhancement. Cord: Normal signal and morphology. No signal changes to suggest subacute combined degeneration. No abnormal enhancement. Normal cord caliber and morphology. Paraspinal and other soft tissues: Paraspinous soft tissues within normal limits. Few small benign appearing cyst noted within the visualized kidneys. Visualized visceral structures otherwise unremarkable. Disc levels: T1-2:  Unremarkable. T2-3: Unremarkable. T3-4:  Unremarkable. T4-5:  Unremarkable. T5-6: Small right central disc protrusion mildly indents the ventral thecal sac. No stenosis or cord impingement. Foramina remain patent. T6-7: Small right paracentral disc protrusion mildly flattens the right ventral thecal sac. No stenosis or cord impingement. Foramina remain patent. T7-8: Left paracentral disc protrusion flattens and partially effaces the left ventral thecal sac. Mild flattening of the left ventral cord without cord signal changes or significant spinal stenosis. Foramina remain patent. T8-9: Left paracentral disc protrusion indents the left ventral thecal sac. Mild cord flattening without cord signal changes. No spinal stenosis. Foramina remain patent. T9-10: Unremarkable. T10-11: Mild disc  bulge. Posterior element hypertrophy. No significant spinal stenosis. Foramina remain patent. T11-12:  Minimal disc bulge.  No stenosis. T12-L1: Small right central disc protrusion indents the right ventral thecal sac. No significant spinal stenosis. Foramina remain patent. MRI LUMBAR SPINE FINDINGS Segmentation: Transitional lumbosacral anatomy with sacralization of the L5 vertebral body. Vertebral body count made from the dens. L5-S1 interspace is rudimentary. Alignment: Trace anterolisthesis of L3 on L4, with 3 mm anterolisthesis of L4 on L5. Findings chronic and facet mediated. Alignment otherwise normal with preservation of the normal lumbar lordosis. Vertebrae: Vertebral body height maintained without acute or chronic fracture. Bone marrow signal intensity within normal limits. No worrisome osseous lesions. No abnormal marrow edema or enhancement. Conus medullaris: Extends to the L1 level and appears normal. No abnormal enhancement. Paraspinal and other soft tissues: Paraspinous soft tissues within normal limits. Multiple scattered benign appearing cyst noted about the visualized kidneys. Visualized visceral structures otherwise unremarkable. Disc levels: L1-2: Disc desiccation without disc bulge. Mild facet hypertrophy. No canal or foraminal stenosis. L2-3: Degenerative intervertebral  disc space narrowing with diffuse disc bulge and disc desiccation. Associated reactive endplate change with marginal endplate osteophytic spurring. Changes more pronounced on the right. Moderate facet hypertrophy. Resultant mild spinal stenosis. Moderate right L2 foraminal narrowing. Left neural foramina remains patent. L3-4: Trace anterolisthesis. Disc bulge with disc desiccation. Superimposed left foraminal disc protrusion impinges upon the exiting left L3 nerve root (series 31, image 13). Moderate facet and ligament flavum hypertrophy with associated small joint effusions. Resultant moderate spinal stenosis. Mild right with  moderate left L3 foraminal narrowing. L4-5: Anterolisthesis. Disc desiccation with mild disc bulge. Severe facet arthrosis. No canal or lateral recess narrowing. Foramina remain patent. 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 No disc bulge or focal disc herniation. No significant stenosis. IMPRESSION: MRI THORACIC SPINE IMPRESSION: 1. No acute abnormality within the thoracic spine, with normal MRI appearance of the thoracic spinal cord. No evidence for subacute combined degeneration. No other cord signal changes to suggest myelopathy. 2. Multilevel degenerative spondylosis with small disc protrusions at T5-6 through T8-9 as above. No significant spinal stenosis. MRI LUMBAR SPINE IMPRESSION: 1. Normal MRI appearance of the conus medullaris and nerve roots of the cauda equina. 2. Multifactorial degenerative changes at L3-4 with resultant moderate spinal stenosis. Superimposed left foraminal disc protrusion impinges on the left L3 nerve root. 3. 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 4. 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 Electronically Signed   By: Rise Mu M.D.   On: 01/09/2021 02:06   MR Lumbar Spine W Wo Contrast  Result Date: 01/09/2021 CLINICAL DATA:  Initial evaluation for acute bilateral lower extremity weakness, myelopathy, frequent falls. B12 deficiency. EXAM: MRI THORACIC AND LUMBAR SPINE WITHOUT AND WITH CONTRAST TECHNIQUE: Multiplanar and multiecho pulse sequences of the thoracic and lumbar spine were obtained without and with intravenous contrast. CONTRAST:  7mL GADAVIST GADOBUTROL 1 MMOL/ML IV SOLN COMPARISON:  Prior radiograph from 05/27/2019. FINDINGS: MRI THORACIC SPINE FINDINGS Alignment: Physiologic with preservation of the normal thoracic kyphosis. No significant listhesis. Vertebrae: Vertebral body height maintained  without acute or chronic fracture. Bone marrow signal intensity within normal limits. Subcentimeter benign hemangioma noted within the T11 vertebral body. No worrisome osseous lesions. Mild discogenic reactive endplate change noted about the partially visualized C6-7 interspace. No other abnormal marrow edema or enhancement. Cord: Normal signal and morphology. No signal changes to suggest subacute combined degeneration. No abnormal enhancement. Normal cord caliber and morphology. Paraspinal and other soft tissues: Paraspinous soft tissues within normal limits. Few small benign appearing cyst noted within the visualized kidneys. Visualized visceral structures otherwise unremarkable. Disc levels: T1-2:  Unremarkable. T2-3: Unremarkable. T3-4:  Unremarkable. T4-5:  Unremarkable. T5-6: Small right central disc protrusion mildly indents the ventral thecal sac. No stenosis or cord impingement. Foramina remain patent. T6-7: Small right paracentral disc protrusion mildly flattens the right ventral thecal sac. No stenosis or cord impingement. Foramina remain patent. T7-8: Left paracentral disc protrusion flattens and partially effaces the left ventral thecal sac. Mild flattening of the left ventral cord without cord signal changes or significant spinal stenosis. Foramina remain patent. T8-9: Left paracentral disc protrusion indents the left ventral thecal sac. Mild cord flattening without cord signal changes. No spinal stenosis. Foramina remain patent. T9-10: Unremarkable. T10-11: Mild disc bulge. Posterior element hypertrophy. No significant spinal stenosis. Foramina remain patent. T11-12:  Minimal disc bulge.  No stenosis. T12-L1: Small right central disc protrusion indents the right ventral thecal  sac. No significant spinal stenosis. Foramina remain patent. MRI LUMBAR SPINE FINDINGS Segmentation: Transitional lumbosacral anatomy with sacralization of the L5 vertebral body. Vertebral body count made from the dens. L5-S1  interspace is rudimentary. Alignment: Trace anterolisthesis of L3 on L4, with 3 mm anterolisthesis of L4 on L5. Findings chronic and facet mediated. Alignment otherwise normal with preservation of the normal lumbar lordosis. Vertebrae: Vertebral body height maintained without acute or chronic fracture. Bone marrow signal intensity within normal limits. No worrisome osseous lesions. No abnormal marrow edema or enhancement. Conus medullaris: Extends to the L1 level and appears normal. No abnormal enhancement. Paraspinal and other soft tissues: Paraspinous soft tissues within normal limits. Multiple scattered benign appearing cyst noted about the visualized kidneys. Visualized visceral structures otherwise unremarkable. Disc levels: L1-2: Disc desiccation without disc bulge. Mild facet hypertrophy. No canal or foraminal stenosis. L2-3: Degenerative intervertebral disc space narrowing with diffuse disc bulge and disc desiccation. Associated reactive endplate change with marginal endplate osteophytic spurring. Changes more pronounced on the right. Moderate facet hypertrophy. Resultant mild spinal stenosis. Moderate right L2 foraminal narrowing. Left neural foramina remains patent. L3-4: Trace anterolisthesis. Disc bulge with disc desiccation. Superimposed left foraminal disc protrusion impinges upon the exiting left L3 nerve root (series 31, image 13). Moderate facet and ligament flavum hypertrophy with associated small joint effusions. Resultant moderate spinal stenosis. Mild right with moderate left L3 foraminal narrowing. L4-5: Anterolisthesis. Disc desiccation with mild disc bulge. Severe facet arthrosis. No canal or lateral recess narrowing. Foramina remain patent. 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 No disc bulge or focal disc herniation. No significant stenosis. IMPRESSION: MRI THORACIC SPINE IMPRESSION: 1. No acute abnormality within the thoracic spine, with normal MRI  appearance of the thoracic spinal cord. No evidence for subacute combined degeneration. No other cord signal changes to suggest myelopathy. 2. Multilevel degenerative spondylosis with small disc protrusions at T5-6 through T8-9 as above. No significant spinal stenosis. MRI LUMBAR SPINE IMPRESSION: 1. Normal MRI appearance of the conus medullaris and nerve roots of the cauda equina. 2. Multifactorial degenerative changes at L3-4 with resultant moderate spinal stenosis. Superimposed left foraminal disc protrusion impinges on the left L3 nerve root. 3. 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 4. 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 Electronically Signed   By: Rise Mu M.D.   On: 01/09/2021 02:06   US Venous Img Lower Bilateral (DVT)  Result Date: 01/07/2021 CLINICAL DATA:  Lower extremity swelling. EXAM: BILATERAL LOWER EXTREMITY VENOUS DOPPLER ULTRASOUND TECHNIQUE: Gray-scale sonography with compression, as well as color and duplex ultrasound, were performed to evaluate the deep venous system(s) from the level of the common femoral vein through the popliteal and proximal calf veins. COMPARISON:  None. FINDINGS: VENOUS Normal compressibility of the common femoral, superficial femoral, and popliteal veins, as well as the visualized calf veins. Visualized portions of profunda femoral vein and great saphenous vein unremarkable. No filling defects to suggest DVT on grayscale or color Doppler imaging. Doppler waveforms show normal direction of venous flow, normal respiratory plasticity and response to augmentation. OTHER None. Limitations: none IMPRESSION: No evidence of bilateral lower extremity DVT. Electronically Signed   By: Narda Rutherford M.D.   On: 01/07/2021 21:35   DG Chest Portable 1 View  Result Date: 01/07/2021 CLINICAL DATA:  Altered mental status.  Confusion and  weakness EXAM: PORTABLE CHEST 1 VIEW COMPARISON:  None. FINDINGS: The heart size  and mediastinal contours are within normal limits. Both lungs are clear. The visualized skeletal structures are unremarkable. IMPRESSION: No active disease. Electronically Signed   By: Marlan Palau M.D.   On: 01/07/2021 17:19   DG Shoulder Left  Result Date: 01/08/2021 CLINICAL DATA:  Falls, LEFT hip and LEFT shoulder pain EXAM: LEFT SHOULDER - 2+ VIEW COMPARISON:  None FINDINGS: Osseous demineralization. AC joint alignment normal. Visualized LEFT ribs intact. No glenohumeral fracture, dislocation, or bone destruction. IMPRESSION: No acute osseous abnormalities. Electronically Signed   By: Ulyses Southward M.D.   On: 01/08/2021 16:46   VAS [place] ABI WITH/WO TBI  Result Date: 12/19/2020  LOWER EXTREMITY DOPPLER STUDY Patient Name:  Curtistine Pettitt Somero  Date of Exam:   12/13/2020 Medical Rec #: [id number]             Accession #:    [id number] Date of Birth: [birth year]              Patient Gender: F Patient Age:   74Y Exam Location:  [facility] Procedure:      VAS [place] ABI WITH/WO TBI Referring Phys: [id number] Erma Pinto BROWN --------------------------------------------------------------------------------  Indications: Claudication.  Comparison Study: 12/14/2019 Performing Technologist: Reece Agar RT (R)(VS)  Examination Guidelines: A complete evaluation includes at minimum, Doppler waveform signals and systolic blood pressure reading at the level of bilateral brachial, anterior tibial, and posterior tibial arteries, when vessel segments are accessible. Bilateral testing is considered an integral part of a complete examination. Photoelectric Plethysmograph (PPG) waveforms and toe systolic pressure readings are included as required and additional duplex testing as needed. Limited examinations for reoccurring indications may be performed as noted.  ABI Findings: +---------+------------------+-----+--------+--------+ Right     Rt Pressure (mmHg)IndexWaveformComment  +---------+------------------+-----+--------+--------+ Brachial 180                                     +---------+------------------+-----+--------+--------+ ATA      192               1.04 biphasic         +---------+------------------+-----+--------+--------+ PTA      179               0.97 biphasic         +---------+------------------+-----+--------+--------+ Great Toe110               0.60 Abnormal         +---------+------------------+-----+--------+--------+ +---------+------------------+-----+---------+-------+ Left     Lt Pressure (mmHg)IndexWaveform Comment +---------+------------------+-----+---------+-------+ Brachial 184                                     +---------+------------------+-----+---------+-------+ ATA      162               0.88 triphasic        +---------+------------------+-----+---------+-------+ PTA      139               0.76 biphasic         +---------+------------------+-----+---------+-------+ Great Toe116               0.63 Abnormal         +---------+------------------+-----+---------+-------+ +-------+-----------+-----------+------------+------------+ ABI/TBIToday's ABIToday's TBIPrevious ABIPrevious TBI +-------+-----------+-----------+------------+------------+ Right  1.04       .60        1.01        .  62          +-------+-----------+-----------+------------+------------+ Left   .88        .63        .99         .65          +-------+-----------+-----------+------------+------------+ Right ABIs appear essentially unchanged compared to prior study on 12/14/2019. Left ABIs appear decreased compared to prior study on 12/14/2019. Bilateral TBI appears essentially unchanged as compared to the previous exa on 12/14/2019.  Summary: Right: Resting right ankle-brachial index is within normal range. No evidence of significant right lower extremity arterial disease. The  right toe-brachial index is abnormal. 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 The left toe-brachial index is abnormal. *See table(s) above for measurements and observations.  Electronically signed by Festus Barren MD on 12/19/2020 at 12:41:33 PM.    Final    DG HIP UNILAT WITH PELVIS 2-3 VIEWS LEFT  Result Date: 01/08/2021 CLINICAL DATA:  Multiple falls, LEFT hip and LEFT shoulder pain EXAM: DG HIP (WITH OR WITHOUT PELVIS) 2-3V LEFT COMPARISON:  None FINDINGS: Osseous demineralization. Hip and SI joint spaces preserved. No acute fracture, dislocation, or bone destruction. Vascular stent at LEFT common iliac artery. Scattered atherosclerotic calcifications. Facet degenerative changes lower lumbar spine. IMPRESSION: No acute osseous abnormalities. Electronically Signed   By: Ulyses Southward M.D.   On: 01/08/2021 16:45   VAS [place] LOWER EXTREMITY VENOUS (DVT)  Result Date: 12/19/2020  Lower Venous DVT Study Patient Name:  EARSIE HUMM  Date of Exam:   12/13/2020 Medical Rec #: [id number]             Accession #:    [id number] Date of Birth: 1945/08/24              Patient Gender: F Patient Age:   38Y Exam Location:  [facility] Procedure:      VAS [place] LOWER EXTREMITY VENOUS (DVT) Referring Phys: [id number] Erma Pinto BROWN --------------------------------------------------------------------------------  Indications: Pain, and Swelling.  Performing Technologist: Reece Agar RT (R)(VS)  Examination Guidelines: A complete evaluation includes B-mode imaging, spectral Doppler, color Doppler, and power Doppler as needed of all accessible portions of each vessel. Bilateral testing is considered an integral part of a complete examination. Limited examinations for reoccurring indications may be performed as noted. The reflux portion of the exam is performed with the patient in reverse Trendelenburg.   +---------+---------------+---------+-----------+----------+-------------------+ RIGHT    CompressibilityPhasicitySpontaneityPropertiesThrombus Aging      +---------+---------------+---------+-----------+----------+-------------------+ CFV      Full                                                             +---------+---------------+---------+-----------+----------+-------------------+ SFJ      Full                                                             +---------+---------------+---------+-----------+----------+-------------------+ FV Prox  Full                                                             +---------+---------------+---------+-----------+----------+-------------------+  FV Mid   Full                                                             +---------+---------------+---------+-----------+----------+-------------------+ FV DistalFull                                                             +---------+---------------+---------+-----------+----------+-------------------+ POP      Full                                                             +---------+---------------+---------+-----------+----------+-------------------+ PTV      Full                                         Not visualized well +---------+---------------+---------+-----------+----------+-------------------+ PERO                                                  Not well visualized +---------+---------------+---------+-----------+----------+-------------------+ Gastroc  Full                                                             +---------+---------------+---------+-----------+----------+-------------------+ SSV      Full                                         Wall thickening     +---------+---------------+---------+-----------+----------+-------------------+ Summary: RIGHT: - No evidence of deep vein thrombosis in the lower extremity. No  indirect evidence of obstruction proximal to the inguinal ligament.  *See table(s) above for measurements and observations. Electronically signed by Festus Barren MD on 12/19/2020 at 12:41:36 PM.    Final     Microbiology: Recent Results (from the past 240 hour(s))  Urine culture     Status: Abnormal   Collection Time: 01/07/21  1:50 PM   Specimen: Urine, Random  Result Value Ref Range Status   Specimen Description   Final    URINE, RANDOM Performed at [facility], [address], [place], [place] [address]    Special Requests   Final    NONE Performed at [facility], [address] [place], [place] [address]    Culture (A)  Final    30,000 COLONIES/mL MULTIPLE SPECIES PRESENT, SUGGEST RECOLLECTION   Report Status 01/09/2021 FINAL  Final  Resp Panel by RT-PCR (Flu A&B, Covid) Nasopharyngeal Swab     Status: None   Collection Time:  01/07/21  5:06 PM   Specimen: Nasopharyngeal Swab; Nasopharyngeal(NP) swabs in vial transport medium  Result Value Ref Range Status   SARS Coronavirus 2 by RT PCR NEGATIVE NEGATIVE Final    Comment: (NOTE) SARS-CoV-2 target nucleic acids are NOT DETECTED.  The SARS-CoV-2 RNA is generally detectable in upper respiratory specimens during the acute phase of infection. The lowest concentration of SARS-CoV-2 viral copies this assay can detect is 138 copies/mL. A negative result does not preclude SARS-Cov-2 infection and should not be used as the sole basis for treatment or other patient management decisions. A negative result may occur with  improper specimen collection/handling, submission of specimen other than nasopharyngeal swab, presence of viral mutation(s) within the areas targeted by this assay, and inadequate number of viral copies(<138 copies/mL). A negative result must be combined with clinical observations, patient history, and epidemiological information. The expected result is Negative.  Fact Sheet for Patients:   BloggerCourse.com  Fact Sheet for Healthcare Providers:  SeriousBroker.it  This test is no t yet approved or cleared by the [place] FDA and  has been authorized for detection and/or diagnosis of SARS-CoV-2 by FDA under an Emergency Use Authorization (EUA). This EUA will remain  in effect (meaning this test can be used) for the duration of the COVID-19 declaration under Section 564(b)(1) of the Act, 21 U.S.C.section 360bbb-3(b)(1), unless the authorization is terminated  or revoked sooner.       Influenza A by PCR NEGATIVE NEGATIVE Final   Influenza B by PCR NEGATIVE NEGATIVE Final    Comment: (NOTE) The Xpert Xpress SARS-CoV-2/FLU/RSV plus assay is intended as an aid in the diagnosis of influenza from Nasopharyngeal swab specimens and should not be used as a sole basis for treatment. Nasal washings and aspirates are unacceptable for Xpert Xpress SARS-CoV-2/FLU/RSV testing.  Fact Sheet for Patients: BloggerCourse.com  Fact Sheet for Healthcare Providers: SeriousBroker.it  This test is not yet approved or cleared by the [place] FDA and has been authorized for detection and/or diagnosis of SARS-CoV-2 by FDA under an Emergency Use Authorization (EUA). This EUA will remain in effect (meaning this test can be used) for the duration of the COVID-19 declaration under Section 564(b)(1) of the Act, 21 U.S.C. section 360bbb-3(b)(1), unless the authorization is terminated or revoked.  Performed at [facility], [address] [place], [place] [address]   SARS CORONAVIRUS 2 (TAT 6-24 HRS) Nasopharyngeal Nasopharyngeal Swab     Status: None   Collection Time: 01/11/21  2:37 PM   Specimen: Nasopharyngeal Swab  Result Value Ref Range Status   SARS Coronavirus 2 NEGATIVE NEGATIVE Final    Comment: (NOTE) SARS-CoV-2 target nucleic acids are NOT DETECTED.  The  SARS-CoV-2 RNA is generally detectable in upper and lower respiratory specimens during the acute phase of infection. Negative results do not preclude SARS-CoV-2 infection, do not rule out co-infections with other pathogens, and should not be used as the sole basis for treatment or other patient management decisions. Negative results must be combined with clinical observations, patient history, and epidemiological information. The expected result is Negative.  Fact Sheet for Patients: HairSlick.no  Fact Sheet for Healthcare Providers: quierodirigir.com  This test is not yet approved or cleared by the [place] FDA and  has been authorized for detection and/or diagnosis of SARS-CoV-2 by FDA under an Emergency Use Authorization (EUA). This EUA will remain  in effect (meaning this test can be used) for the duration of the COVID-19 declaration under Se ction 564(b)(1) of  the Act, 21 U.S.C. section 360bbb-3(b)(1), unless the authorization is terminated or revoked sooner.  Performed at [facility], [address] [place], [place] [address]      Labs: Basic Metabolic Panel: Recent Labs  Lab 01/08/21 0602 01/09/21 0808 01/10/21 0540 01/11/21 0845 01/12/21 0540  NA 142 140 142 141 143  K 3.9 3.7 3.6 3.9 3.7  CL 110 108 107 108 113*  CO2 26 25 27 25 26   GLUCOSE 125* 97 134* 122* 152*  BUN 31* 22 26* 34* 39*  CREATININE 1.13* 0.89 1.17* 1.19* 1.05*  CALCIUM 9.1 9.0 9.3 9.6 9.0  MG  --   --  2.1  --  2.0   Liver Function Tests: Recent Labs  Lab 01/07/21 1348 01/09/21 0808  AST 24 22  ALT 17 16  ALKPHOS 51 52  BILITOT 0.9 0.6  PROT 7.7 6.8  ALBUMIN 4.6 3.8   No results for input(s): LIPASE, AMYLASE in the last 168 hours. Recent Labs  Lab 01/07/21 2013  AMMONIA 22   CBC: Recent Labs  Lab 01/07/21 1348 01/08/21 0602 01/09/21 0808  WBC 6.5 5.0 4.9  NEUTROABS  --   --  3.1  HGB 13.1 12.9 13.4  HCT  39.8 39.9 41.0  MCV 85.6 84.2 84.2  PLT 166 162 168   Cardiac Enzymes: Recent Labs  Lab 01/07/21 1848  CKTOTAL 227   BNP: BNP (last 3 results) Recent Labs    01/08/21 0602  BNP 134.9*    ProBNP (last 3 results) No results for input(s): PROBNP in the last 8760 hours.  CBG: No results for input(s): GLUCAP in the last 168 hours.     Signed:  Ramiro Harvest MD.  [facility] 01/12/2021, 12:52 PM

## 2021-01-12 NOTE — Progress Notes (Signed)
Spoke with Field seismologist at [facility] in [place]. Gave report, gathered items and placed in bag. Patient notified nurse tech and nurse that daughter came and took personal items she had in room. EMS to transport to facility.

## 2021-01-12 NOTE — TOC Transition Note (Signed)
Transition of Care [facility]) - CM/SW Discharge Note   Patient Details  Name: Roseline Ebarb MRN: [id number] Date of Birth: 1946/06/03  Transition of Care [facility] CM/SW Contact:  Allayne Butcher, RN Phone Number: 01/12/2021, 1:22 PM   Clinical Narrative:    EMS has been arranged, unsure of ETA patient has a couple of transports ahead of hers.  Daughter, Lawson Fiscal, will be going to [place] to sign admission paperwork today.  Bedside RN calling report- patient going to room 401 at [facility].   Final next level of care: Skilled Nursing Facility Barriers to Discharge: Barriers Resolved   Patient Goals and CMS Choice Patient states their goals for this hospitalization and ongoing recovery are:: Patient is in agreement for SNF for short term rehab CMS Medicare.gov Compare Post Acute Care list provided to:: Patient Represenative (must comment) Choice offered to / list presented to : Adult Children, [facility] POA / Guardian  Discharge Placement PASRR number recieved: 01/09/21            Patient chooses bed at: [facility] Patient to be transferred to facility by: [facility] Name of family member notified: Lawson Fiscal Patient and family notified of of transfer: 01/12/21  Discharge Plan and Services                DME Arranged: N/A DME Agency: NA       HH Arranged: NA          Social Determinants of Health (SDOH) Interventions     Readmission Risk Interventions Readmission Risk Prevention Plan 08/03/2019  Post Dischage Appt Complete  Medication Screening Complete  Transportation Screening Complete  Some recent data might be hidden

## 2021-01-12 NOTE — TOC Transition Note (Signed)
Transition of Care [facility]) - CM/SW Discharge Note   Patient Details  Name: Tara [name] MRN: [id number] Date of Birth: Oct 13, 1945  Transition of Care [facility]) CM/SW Contact:  Allayne Butcher, RN Phone Number: 01/12/2021, 12:21 PM   Clinical Narrative:    Patient will discharge to [facility] in [place] today for short term rehab.  Patient's daughter Lawson Fiscal is aware of discharge today.  RNCM will call Lawson Fiscal once the discharge orders have been placed, she will come up and help the patient get dressed and ready.  [facility] will be arranged once discharge completed.     Final next level of care: Skilled Nursing Facility Barriers to Discharge: Barriers Resolved   Patient Goals and CMS Choice Patient states their goals for this hospitalization and ongoing recovery are:: Patient is in agreement for SNF for short term rehab CMS Medicare.gov Compare Post Acute Care list provided to:: Patient Represenative (must comment) Choice offered to / list presented to : Adult Children, [facility] POA / Guardian  Discharge Placement PASRR number recieved: 01/09/21            Patient chooses bed at: [facility] Patient to be transferred to facility by:  EMS Name of family member notified: Lawson Fiscal Patient and family notified of of transfer: 01/12/21  Discharge Plan and Services                DME Arranged: N/A DME Agency: NA       HH Arranged: NA          Social Determinants of Health (SDOH) Interventions     Readmission Risk Interventions Readmission Risk Prevention Plan 08/03/2019  Post Dischage Appt Complete  Medication Screening Complete  Transportation Screening Complete  Some recent data might be hidden

## 2021-01-17 ENCOUNTER — Telehealth: Payer: Federal, State, Local not specified - PPO | Admitting: General Practice

## 2021-01-17 ENCOUNTER — Ambulatory Visit (INDEPENDENT_AMBULATORY_CARE_PROVIDER_SITE_OTHER): Payer: Federal, State, Local not specified - PPO | Admitting: General Practice

## 2021-01-17 DIAGNOSIS — E782 Mixed hyperlipidemia: Secondary | ICD-10-CM | POA: Diagnosis not present

## 2021-01-17 DIAGNOSIS — R292 Abnormal reflex: Secondary | ICD-10-CM

## 2021-01-17 DIAGNOSIS — I10 Essential (primary) hypertension: Secondary | ICD-10-CM | POA: Diagnosis not present

## 2021-01-17 DIAGNOSIS — R258 Other abnormal involuntary movements: Secondary | ICD-10-CM

## 2021-01-17 DIAGNOSIS — F339 Major depressive disorder, recurrent, unspecified: Secondary | ICD-10-CM

## 2021-01-17 DIAGNOSIS — R413 Other amnesia: Secondary | ICD-10-CM

## 2021-01-17 DIAGNOSIS — Z8673 Personal history of transient ischemic attack (TIA), and cerebral infarction without residual deficits: Secondary | ICD-10-CM

## 2021-01-17 DIAGNOSIS — F419 Anxiety disorder, unspecified: Secondary | ICD-10-CM

## 2021-01-17 DIAGNOSIS — W19XXXD Unspecified fall, subsequent encounter: Secondary | ICD-10-CM

## 2021-01-17 NOTE — Chronic Care Management (AMB) (Signed)
Chronic Care Management   CCM RN Visit Note  01/17/2021 Name: Tara [name] MRN: [id number] DOB: [birth year]  Subjective: Tara [name] is a 75 y.o. year old female who is a primary care patient of Valerie Roys, DO. The care management team was consulted for assistance with disease management and care coordination needs.    Engaged with patient by telephone for follow up visit in response to provider referral for case management and/or care coordination services. Spoke to the patients daughter [name], who is also DRP. She states the patient was in the hospital from 01-07-2021 to 01-12-2021 and is now at SNF getting rehab.    Consent to Services:  The patient was given information about Chronic Care Management services, agreed to services, and gave verbal consent prior to initiation of services.  Please see initial visit note for detailed documentation.   Patient agreed to services and verbal consent obtained.   Assessment: Review of patient past medical history, allergies, medications, health status, including review of consultants reports, laboratory and other test data, was performed as part of comprehensive evaluation and provision of chronic care management services.   SDOH (Social Determinants of Health) assessments and interventions performed:    CCM Care Plan  No Known Allergies  Outpatient Encounter Medications as of 01/17/2021  Medication Sig Note   amLODipine (NORVASC) 10 MG tablet Take 1 tablet (10 mg total) by mouth daily.    aspirin EC 81 MG tablet Take 81 mg by mouth daily. Swallow whole.    B-D TB SYRINGE 1CC/27GX1/2" 27G X 1/2" 1 ML MISC USE AS DIRECTED EVERY 30 DAYS    citalopram (CELEXA) 40 MG tablet Take 1 tablet (40 mg total) by mouth daily.    clonazePAM (KLONOPIN) 0.5 MG tablet Take 0.5 tablets (0.25 mg total) by mouth daily as needed.    clopidogrel (PLAVIX) 75 MG tablet Take 1 tablet (75 mg total) by mouth daily.    cyanocobalamin (,VITAMIN B-12,)  1000 MCG/ML injection Inject 1 mL (1,000 mcg total) into the muscle every 30 (thirty) days.    diclofenac Sodium (VOLTAREN) 1 % GEL Apply 4 g topically 4 (four) times daily.    donepezil (ARICEPT) 5 MG tablet Take 5 mg by mouth daily.    fenofibrate micronized (LOFIBRA) 200 MG capsule TAKE 1 CAPSULE(200 MG) BY MOUTH DAILY    fluticasone (FLONASE) 50 MCG/ACT nasal spray Place 1-2 sprays into both nostrils daily.    Insulin Syringe 27G X 1/2" 1 ML MISC 1 each by Does not apply route every 30 (thirty) days.    isosorbide mononitrate (IMDUR) 60 MG 24 hr tablet Take 1 tablet (60 mg total) by mouth in the morning and at bedtime.    lamoTRIgine (LAMICTAL) 25 MG tablet Take 50 mg by mouth 2 (two) times daily. 12/11/2020: 50 mg BID   lidocaine (LIDODERM) 5 % Place 2 patches onto the skin daily.    lisinopril (ZESTRIL) 40 MG tablet TAKE 1 TABLET(40 MG) BY MOUTH TWICE DAILY    loperamide (IMODIUM) 2 MG capsule Take 2 mg by mouth as needed for diarrhea or loose stools.    montelukast (SINGULAIR) 10 MG tablet Take 10 mg by mouth daily.    naproxen (NAPROSYN) 375 MG tablet Take 1 tablet (375 mg total) by mouth 2 (two) times daily as needed for mild pain or moderate pain.    neomycin-bacitracin-polymyxin (NEOSPORIN) OINT Apply 1 application topically as needed for wound care.    nicotine (NICODERM CQ) 21 mg/24hr patch  Place 1 patch (21 mg total) onto the skin daily.    nitroGLYCERIN (NITROSTAT) 0.4 MG SL tablet Place 1 tablet (0.4 mg total) under the tongue every 5 (five) minutes as needed for chest pain.    nortriptyline (PAMELOR) 50 MG capsule Take 50 mg by mouth at bedtime.    omeprazole (PRILOSEC) 40 MG capsule Take 1 capsule (40 mg total) by mouth 2 (two) times daily. TAKE 1 CAPSULE BY MOUTH TWICE DAILY    pregabalin (LYRICA) 75 MG capsule Take 75 mg by mouth 2 (two) times daily.    rosuvastatin (CRESTOR) 40 MG tablet Take 1 tablet (40 mg total) by mouth daily.    traZODone (DESYREL) 100 MG tablet Take 100  mg by mouth at bedtime. Take along with one 50 mg tablet for total 150 mg at bedtime    traZODone (DESYREL) 50 MG tablet Take 1 tablet (50 mg total) by mouth at bedtime. Take along with one 100 mg tablet for total 150 mg at bedtime    No facility-administered encounter medications on file as of 01/17/2021.    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   History of stroke 07/25/2020   Memory loss, short term 07/25/2020   Visual disturbance 07/25/2020   Tremors of nervous system 07/25/2020   Difficulty walking 03/09/2020   TIA (transient ischemic attack) 03/09/2020   Polyneuropathy, unspecified 12/08/2019   Difficulty sleeping 10/29/2019   Numbness 10/29/2019   Right leg numbness 10/29/2019   Numbness and tingling in left hand 10/29/2019   Lymphedema 10/26/2019   Atherosclerosis of native arteries of extremity with intermittent claudication ([place]) 10/26/2019   Nerve pain 10/12/2019   Memory loss 10/12/2019   PAD (peripheral artery disease) ([place]) 08/02/2019   Temporal arteritis (HCC)    Other intervertebral disc degeneration, thoracic region 06/21/2019   GERD (gastroesophageal reflux disease) 06/21/2019   Angiodysplasia of intestinal tract    Intestinal bypass or anastomosis status    History of aortic valve replacement 02/07/2019   B12 deficiency 02/07/2019   Anxiety 02/05/2019   HTN (hypertension) 02/05/2019   Hyperlipidemia 02/05/2019   IBS (irritable bowel syndrome) 02/05/2019   Arthritis of both knees 02/05/2019   CTS (carpal tunnel syndrome) 02/05/2019    Conditions to be addressed/monitored:HTN, HLD, Anxiety, Depression, and History of strokes, new diagnosis of clonus hyperreflexia, falls prevention  and safety due to multiple falls.   Care Plan : RNCM: HLD management  Updates made by Vanita Ingles since 01/17/2021 12:00 AM     Problem: RNCM: HLD management   Priority: Medium     Long-Range Goal: RNCM: HLD Coping Skills Enhanced   Start Date: 08/08/2020  Expected End Date: 09/14/2021  This Visit's Progress: On track  Priority: Medium  Note:   Current Barriers:  Poorly controlled hyperlipidemia, complicated by Poorly controlled hyperlipidemia, complicated by smoker, memory changes, several chronic conditions  Current antihyperlipidemic regimen: Crestor 40 mg QD Most recent lipid panel:     Component Value Date/Time   CHOL 113 09/12/2020 1415   TRIG 103 09/12/2020 1415   HDL 44 09/12/2020 1415   CHOLHDL 5.5 07/13/2019 1615   VLDL 48 (H) 07/13/2019 1615   LDLCALC 50 09/12/2020 1415   LDLDIRECT  145.7 (H) 07/13/2019 1615   ASCVD risk enhancing conditions: age >34, HTN, history of strokes, current smoker Unable to independently manage HLD  Unable to self administer medications as prescribed Unable to perform ADLs independently Unable to perform IADLs independently Does not contact provider office for questions/concerns RN Care Manager Clinical Goal(s):  patient will work with Consulting civil engineer, providers, and care team towards execution of optimized self-health management plan patient will verbalize understanding of plan for effective management of HLD patient will work with [facility], CCM team and pcp  to address needs related to effective management of HLD  patient will take all medications exactly as prescribed and will call provider for medication related questions patient will attend all scheduled medical appointments: advised daughter the patient would need a follow up visit after being discharged from the SNF. Original appointment for 01-23-2021 will need to be changed. [facility] for reschedule.  patient will demonstrate improved health management  independence the patient will demonstrate ongoing self health care management ability Interventions: Collaboration with Valerie Roys, DO regarding development and update of comprehensive plan of care as evidenced by provider attestation and co-signature Inter-disciplinary care team collaboration (see longitudinal plan of care) Medication review performed; medication list updated in electronic medical record.  Inter-disciplinary care team collaboration (see longitudinal plan of care) Referred to pharmacy team for assistance with HLD medication management Evaluation of current treatment plan related to HLD and patient's adherence to plan as established by provider. Advised patient to call the office when patient is discharged from SNF and make a follow up appointment with pcp, also to call the office for any questions or concerns.  Provided education to patient re: heart healthy diet, medications compliance, and working with the CCM team to optimize health and well being.  Reviewed medications with patient and discussed compliance Discussed plans with patient for ongoing care management follow up and provided patient with direct contact information for care management team Patient Goals/Self-Care Activities: - call for medicine refill 2 or 3 days before it runs out - call if I am sick and can't take my medicine - keep a list of all the medicines I take; vitamins and herbals too - learn to read medicine labels - use a pillbox to sort medicine - use an alarm clock or phone to remind me to take my medicine - change to whole grain breads, cereal, pasta - drink 6 to 8 glasses of water each day - eat 3 to 5 servings of fruits and vegetables each day - eat 5 or 6 small meals each day - eat fish at least once per week - fill half the plate with nonstarchy vegetables - limit fast food meals to no more than 1 per week - manage portion size - prepare main meal at home 3 to 5 days each week - read  food labels for fat, fiber, carbohydrates and portion size - be open to making changes - I can manage, know and watch for signs of a heart attack - if I have chest pain, call for help - learn about small changes that will make a big difference - learn my personal risk factors  Follow Up Plan: Telephone follow up appointment with care management team member scheduled for: 02-21-2021 at 1015 am      Task: RNCM: HLD-Support Psychosocial Response to Risk or Actual Health Condition Completed 01/17/2021  Outcome: Positive  Note:   Care Management Activities:    - active  listening utilized - caregiver stress acknowledged - counseling provided - current coping strategies identified - decision-making supported - healthy lifestyle promoted - meditative movement therapy encouraged - mindfulness encouraged - participation in counseling encouraged - problem-solving facilitated - self-reflection promoted - spiritual activities promoted - verbalization of feelings encouraged        Problem: RNCM: Vision Changes Quality of Life (General Plan of Care)   Priority: High     Long-Range Goal: RNCM: Vision Changes Quality of Life Maintained   Start Date: 08/08/2020  Expected End Date: 09/14/2021  This Visit's Progress: On track  Priority: High  Note:   Current Barriers:  Knowledge Deficits related to vision changes due to CVA Care Coordination needs related to resources in the community  in a patient with changes in vision and loss of vision  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 Cognitive Deficits Unable to independently manage care due to changes in vision Unable to self administer medications as prescribed Does not attend all scheduled provider appointments Does not adhere to prescribed medication regimen Lacks social connections Unable to perform ADLs independently Unable to perform IADLs independently Does not maintain contact with provider  office Does not contact provider office for questions/concerns  Nurse Case Manager Clinical Goal(s):  Over the next 120 days, patient will verbalize understanding of plan for effective management of meeting needs in patient with vision changes  Over the next 120 days, patient will work with [place], CCM team, and specialist  to address needs related to vision loss  Over the next 120 days, patient will attend all scheduled medical appointments: eye specialist on 08-10-2020, appointment completed. The patient declared legally blind Interventions:  1:1 collaboration with Valerie Roys, DO regarding development and update of comprehensive plan of care as evidenced by provider attestation and co-signature Inter-disciplinary care team collaboration (see longitudinal plan of care) Evaluation of current treatment plan related to loss of vision  and patient's adherence to plan as established by provider. 08-16-2020: Saw eye specialist last week. Was diagnosed with cortical blindness. The patient if not handling well, but has support from her daughter. 09-19-2020: The daughter states things are about the same. They are adjusting to the new diagnosis and are taking things a day at a time. 11-14-2020: The patients daughter states the patient is not seeing well at all. She gets frustrated and sometimes thinks she has different shoes on and the daughter has to redirect her. She is dealing with it the best she can and it is on a day to day basis. 01-17-2021: The patient is currently in SNF receiving rehab. She is actually doing better right now and adjusting to her chronic conditions. Is receiving OT/PT/Speech. The patient had been having multiple falls. Since changes in medications the daughter reports improved memory.  Advised patient to keep appointment with eye doctor, call office for changes in condition  Provided education to patient re: services for the blind and working with resources to help with patients expressed  needs due to vision loss. 08-16-2020: Review of resources available for services for the blind. This is all new to the daughter and the patient. Will continue to monitor for changes and needs. 09-19-2020: The daughter has resources. Also sending text message so the daughter can sign up for my chart to view notes and records. Text message sent to [phone]. Education provided for the daughter.  11-14-2020: The patients daughter is set up for my chart now and can view notes. 01-17-2021: The daughter has  resources and is appreciative of the follow up from the CCM team. Will continue to monitor for needs.  Discussed plans with patient for ongoing care management follow up and provided patient with direct contact information for care management team Reviewed scheduled/upcoming provider appointments including: eye specialist on 08-15-2020 at 0900 am. 11-14-2020: Will see pcp on 12-11-2020 at 3 pm, has upcoming sleep study in May. 01-17-2021: The patient had a follow up scheduled for 01-23-2021 but due to being in SNF will need to changes this. In basket message sent to the [facility] admin staff to reach out to the daughter to reschedule.   Patient Goals/Self-Care Activities Over the next 120 days, patient will:  - Patient will self administer medications as prescribed Patient will attend all scheduled provider appointments Patient will call pharmacy for medication refills Patient will continue to perform ADL's independently Patient will continue to perform IADL's independently Patient will call provider office for new concerns or questions Patient will work with BSW to address care coordination needs and will continue to work with the clinical team to address health care and disease management related needs.    - affirmation provided - community involvement promoted - expression of thoughts about present/future encouraged - independence in all possible areas promoted - patient strengths promoted - self-expression  encouraged - sleep hygiene techniques encouraged - social relationships promoted Follow Up Plan: Telephone follow up appointment with care management team member scheduled for: 02-21-2021 at 1015 am        Task: [facility]: Support and Maintain Acceptable Degree of Health, Comfort and Happiness Completed 01/17/2021  Outcome: Positive  Note:   Care Management Activities:    - affirmation provided - community involvement promoted - expression of thoughts about present/future encouraged - independence in all possible areas promoted - patient strengths promoted - self-expression encouraged - sleep hygiene techniques encouraged - social relationships promoted        Care Plan : RNCM: Stroke (Adult)  Updates made by Vanita Ingles since 01/17/2021 12:00 AM     Problem: RNCM: Emotional Adjustment to Disease (Stroke)   Priority: High     Long-Range Goal: RNCM: Effective management of stroke   Start Date: 08/08/2020  Expected End Date: 09/14/2021  This Visit's Progress: On track  Priority: High  Note:   Current Barriers:  Knowledge Deficits related to effective management of s/p stroke and TIas Care Coordination needs related to community resources and expressed needs  in a patient with past stroke and changes in condition related to TIAs and mini-strokes  Chronic Disease Management support and education needs related to management of stroke and prevention of new HA and/orstroke  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 Cognitive Deficits Unable to independently manage care due to "mini-strokes" and TIAs Unable to self administer medications as prescribed Lacks social connections Unable to perform ADLs independently Unable to perform IADLs independently Does not contact provider office for questions/concerns  Nurse Case Manager Clinical Goal(s):  Over the next 120 days, patient will verbalize understanding of plan for effective management of CVA  and residual effects from TIAs  Over the next 120 days, patient will work with [facility], CCM team and pcp to address needs related to care of patient in the home with post residual deficits and decline in condition- 01-17-2021: The patient is currently in rehab post hospital discharge.  Over the next 120 days, patient will demonstrate a decrease in TIA episodes  exacerbations as evidenced by no new CVA events  Over the next 120 days, patient will work with CM team pharmacist to reconcile medications, polypharmacy and dubplications  Over the next 120 days, patient will work with care guides  (community agency) to help with expressed needs: transportation, food resources, and community resources   Interventions:  1:1 collaboration with Valerie Roys, DO regarding development and update of comprehensive plan of care as evidenced by provider attestation and co-signature Inter-disciplinary care team collaboration (see longitudinal plan of care) Provided education to patient re: talking to care guides about resources, calling insurance to inquire about life alert, working with CCM team to meet expressed needs  Reviewed medications with patient and discussed compliance and pharmacy referral. 08-16-2020: The pharmacist has reviewed with the daughter medications list and the daughter is more at ease with medications. The pharmacist will continue to work with the patients daughter. 11-14-2020: The daughter [name] denies any medication concerns today. Review of medication and daughter updated the RNCM on changes recently from the neurology. 01-17-2021: The patients daughter [name] states that there have been several medication changes since the patient has been in the hospital. The patient is tapering off of Klonopin and Lamictal and is no longer taking Buspar. The patient is in SNF for rehab. Will update medications list post discharge from SNF.  Collaborated with pcp and pharmacy  regarding medication question and  concerns. 11-14-2020: The patients daughter is managing medications and has not new questions at this time. Will continue to follow. 01-17-2021: The patients daughter is managing her medications and doing great job at keeping everything straight. The patient is currently in SNF and is being managed there.  Discussed plans with patient for ongoing care management follow up and provided patient with direct contact information for care management team Social Work referral for needs in the home, placement questions- currently working with LCSW. 01-17-2021: Ongoing support and eduction from the LCSW. Pharmacy referral for polypharmacy, medication reconciliation, duplicate medications  Review of new stroke and "mini-stroke" events. Per [name] they did a MRI and it revealed the patient had not had any new episodes of stroke. She has been diagnosed as having Clonus hyperreactivity and this is causing a lot of her issues including the frequent falls.   Patient Goals/Self-Care Activities Over the next 120 days, patient will:  - Patient will self administer medications as prescribed Patient will attend all scheduled provider appointments Patient will call pharmacy for medication refills Patient will continue to perform ADL's independently Patient will continue to perform IADL's independently Patient will call provider office for new concerns or questions Patient will work with BSW to address care coordination needs and will continue to work with the clinical team to address health care and disease management related needs.   - caregiver stress acknowledged - caregiver support provided - counseling provided - decision-making supported - depression screen reviewed - family care conference arranged - financial counseling provided - goal-setting facilitated - positive reinforcement provided - problem-solving facilitated - self-care encouraged - self-reflection promoted - verbalization of feelings  encouraged   Follow Up Plan: Telephone follow up appointment with care management team member scheduled for: 02-21-2021 at 1015 am        Task: RNCM: Support Psychosocial Response to Stroke Completed 01/17/2021  Outcome: Positive  Note:   Care Management Activities:    - caregiver stress acknowledged - caregiver support provided - counseling provided - decision-making supported - depression screen reviewed - family care conference arranged - financial counseling provided - goal-setting facilitated - positive reinforcement provided -  problem-solving facilitated - self-care encouraged - self-reflection promoted - verbalization of feelings encouraged        Care Plan : RNCM: Hypertension (Adult)  Updates made by Vanita Ingles since 01/17/2021 12:00 AM     Problem: RNCM: Hypertension (Hypertension)   Priority: Medium     Long-Range Goal: RNCM: Hypertension Monitored   Start Date: 08/08/2020  This Visit's Progress: On track  Priority: Medium  Note:   Objective:  Last practice recorded BP readings:  BP Readings from Last 3 Encounters:  01/12/21 138/68  12/13/20 140/75  12/11/20 134/76   Most recent eGFR/CrCl: No results found for: EGFR  No components found for: CRCL Current Barriers:  Knowledge Deficits related to basic understanding of hypertension pathophysiology and self care management Knowledge Deficits related to understanding of medications prescribed for management of hypertension Cognitive Deficits Unable to independently manage HTN Unable to self administer medications as prescribed Unable to perform ADLs independently Unable to perform IADLs independently Does not contact provider office for questions/concerns Case Manager Clinical Goal(s):  patient will verbalize understanding of plan for hypertension management patient will attend all scheduled medical appointments: will need to schedule an appointment post discharge from SNF patient will demonstrate  improved adherence to prescribed treatment plan for hypertension as evidenced by taking all medications as prescribed, monitoring and recording blood pressure as directed, adhering to low sodium/DASH diet patient will demonstrate improved health management independence as evidenced by checking blood pressure as directed and notifying PCP if SBP>150 or DBP > 90, taking all medications as prescribe, and adhering to a low sodium diet as discussed. patient will verbalize basic understanding of hypertension disease process and self health management plan as evidenced by compliance with heart healthy diet, compliance with medications and working with the CCM team to manage health and well being.  Interventions:  Collaboration with Valerie Roys, DO regarding development and update of comprehensive plan of care as evidenced by provider attestation and co-signature Inter-disciplinary care team collaboration (see longitudinal plan of care) Evaluation of current treatment plan related to hypertension self management and patient's adherence to plan as established by provider. Provided education to patient re: stroke prevention, s/s of heart attack and stroke, DASH diet, complications of uncontrolled blood pressure Reviewed medications with patient and discussed importance of compliance Discussed plans with patient for ongoing care management follow up and provided patient with direct contact information for care management team Advised patient, providing education and rationale, to monitor blood pressure daily and record, calling PCP for findings outside established parameters.  Self-Care Activities: - Self administers medications as prescribed Attends all scheduled provider appointments Calls provider office for new concerns, questions, or BP outside discussed parameters Checks BP and records as discussed Follows a low sodium diet/DASH diet Patient Goals: - check blood pressure weekly - choose a place to  take my blood pressure (home, clinic or office, retail store) - write blood pressure results in a log or diary - agree on reward when goals are met - agree to work together to make changes - ask questions to understand - have a family meeting to talk about healthy habits - learn about high blood pressure  Follow Up Plan: Telephone follow up appointment with care management team member scheduled for: 02-21-2021 at 1015 am    Task: RNCM: Identify and Monitor Blood Pressure Elevation Completed 01/17/2021  Outcome: Positive  Note:   Care Management Activities:    - blood pressure trends reviewed - depression screen reviewed - home or  ambulatory blood pressure monitoring encouraged    Notes: Does not have a blood pressure cuff. Checking into OTC benefit with insurance company to obtain a blood pressure cuff.     Care Plan : RNCM: Depression (Adult)  Updates made by Vanita Ingles since 01/17/2021 12:00 AM     Problem: RNCM: Depression Identification (Depression)   Priority: Medium     Long-Range Goal: RNCM: Depressive Symptoms Identified   Start Date: 08/08/2020  Expected End Date: 09/14/2021  This Visit's Progress: On track  Priority: Medium  Note:   Current Barriers:  Knowledge Deficits related to depression and anxiety  Chronic Disease Management support and education needs related to depression and anxiety  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 Non-adherence to scheduled provider appointments Non-adherence to prescribed medication regimen Unable to independently manage depression and anxiety  Unable to self administer medications as prescribed Does not attend all scheduled provider appointments Does not adhere to prescribed medication regimen Lacks social connections Unable to perform ADLs independently Unable to perform IADLs independently Does not maintain contact with provider office Does not contact provider office for  questions/concerns  Nurse Case Manager Clinical Goal(s):  Over the next 120 days, patient will verbalize understanding of plan for management of depression and anxiety Over the next 120 days, patient will work with RNCM, CCM, and pcp to address needs related to depression and anxiety Over the next 120 days, patient will attend all scheduled medical appointments: will call for appointment with pcp  Interventions:  1:1 collaboration with Valerie Roys, DO regarding development and update of comprehensive plan of care as evidenced by provider attestation and co-signature Inter-disciplinary care team collaboration (see longitudinal plan of care) Evaluation of current treatment plan related to anxiety and depression  and patient's adherence to plan as established by provider. 08-16-2020: Saw neurologist and eye specialist. 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 Will need CCM team support. 11-14-2020: [name] states that they are taking it day by day. She has good days and not so good days. The daughter is with the patient 24/7, she can tell when she is having an off day. 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 The daughter knows to call for changes. States some days are worse than other with her depression. She is having a good day today. Working with LCSW also. Knows resources are available. 01-17-2021: The patients daughter states that her depression is actually better and she is feeling better and that she is improving with her memory loss and other conditions since hospitalization.  Provided education to patient and/or caregiver about advanced directives Provided education to patient re: calling the office for changes in mood/anxiety/depression Reviewed medications with patient and discussed compliance, poly pharmacy, and duplications- pharmacy referral  Collaborated with RNCM, pcp, and CCM team regarding patients expressed needs  Discussed plans with patient for ongoing care  management follow up and provided patient with direct contact information for care management team Reviewed scheduled/upcoming provider appointments including: will see the pcp in the next few weeks, knows to call for changes.  Next appointment on 12-11-2020 at 3 pm. 01-17-2021: the patient has an appointment on 01-23-2021. The patient is currently in SNF and will need to reschedule her appointment with pcp.  Patient Goals/Self-Care Activities Over the next 120 days, patient will:  - Patient will self administer medications as prescribed Patient will attend all scheduled provider appointments Patient will call pharmacy for medication refills Patient is dependent on daughter and  others to help meet her ADLS/IADLS Patient will call provider office for new concerns or questions Patient will work with BSW to address care coordination needs and will continue to work with the clinical team to address health care and disease management related needs.    - anxiety screen reviewed - depression screen reviewed - medication list reviewed - participation in psychiatric services encouraged - substance use assessed - substance use risk screen reviewed Follow Up Plan: Telephone follow up appointment with care management team member scheduled for: 02-21-2021 at 1015 am        Task: RNCM: Identify Depressive Symptoms and Facilitate Treatment Completed 01/17/2021  Outcome: Positive  Note:   Care Management Activities:    - anxiety screen reviewed - depression screen reviewed - medication list reviewed - participation in psychiatric services encouraged - substance use assessed - substance use risk screen reviewed        Care Plan : RNCM: Fall Risk (Adult)  Updates made by Vanita Ingles since 01/17/2021 12:00 AM     Problem: RNCM: Fall Risk   Priority: High     Long-Range Goal: RNCM: Absence of Fall and Fall-Related Injury   Start Date: 01/17/2021  Expected End Date: 10/04/2021  This Visit's  Progress: On track  Priority: High  Note:   Current Barriers:  Knowledge Deficits related to fall precautions in patient with new diagnosis of clonus hyperreflexia, blindness, past history of seizures, past history of strokes and multiple chronic conditions impacting care.  Decreased adherence to prescribed treatment for fall prevention Unable to independently manage falls as evidence of several falls with hospitalization from 01-07-2021 to 01-12-2021 and currently at rehab facility getting OT/PT/Speech therapy Unable to self administer medications as prescribed Unable to perform ADLs independently Unable to perform IADLs independently Knowledge Deficits related to falls prevention and safety  Chronic Disease Management support and education needs related to new diagnosis of clonus hyperreactivity and other chronic conditions impacting patient care Clinical Goal(s):  patient will demonstrate improved adherence to prescribed treatment plan for decreasing falls as evidenced by patient reporting and review of EMR patient will verbalize using fall risk reduction strategies discussed patient will not experience additional falls patient will verbalize understanding of plan for falls prevention and safety  patient will work with [facility] and pcp  to address needs related to remaining safe in her environment  patient will take all medications exactly as prescribed and will call provider for medication related questions patient will demonstrate improved health management independence the patient will demonstrate ongoing self health care management ability Interventions:  Collaboration with Valerie Roys, DO regarding development and update of comprehensive plan of care as evidenced by provider attestation and co-signature Inter-disciplinary care team collaboration (see longitudinal plan of care) Provided written and verbal education re: Potential causes of falls and Fall prevention strategies Reviewed  medications and discussed potential side effects of medications such as dizziness and frequent urination Assessed for s/s of orthostatic hypotension Assessed for falls since last encounter. Assessed patients knowledge of fall risk prevention secondary to previously provided education. Assessed working status of life alert bracelet and patient adherence Provided patient information for fall alert systems Evaluation of current treatment plan related to falls prevention and safety and patient's adherence to plan as established by provider. Advised patient to call the office for changes in conditions, questions/Concerns, and new falls  Provided education to patient re: falls prevention and safety. Per the daughter the patient had multiple falls and EMS had to  be called 2 times within 24 hours. The decision was made to go to the hospital to be evaluated. The patient was admitted and had multiple test. Patient hospitalized from 01-07-2021 to 01-12-2021 with discharge to SNF for rehab. Will continue to monitor for discharge from SNF and follow up appropriately. Discussed plans with patient for ongoing care management follow up and provided patient with direct contact information for care management team Self-Care Deficits:  Unable to independently manage fall prevention and safety  Unable to self administer medications as prescribed Unable to perform ADLs independently Unable to perform IADLs independently Patient Goals:  - Utilize walker (assistive device) appropriately with all ambulation - De-clutter walkways - Change positions slowly - Wear secure fitting shoes at all times with ambulation - Utilize home lighting for dim lit areas - Demonstrate self and pet awareness at all times Follow Up Plan: Telephone follow up appointment with care management team member scheduled for: 02-21-2021 at 1015 am    Task: RNCM: Identify and Manage Contributors to Fall Risk Completed 01/17/2021  Outcome: Positive   Note:   Care Management Activities:    - activities of daily living skills assessed - barriers to physical activity or exercise addressed - barriers to physical activity or exercise identified - barriers to safety identified - cognition assessed - cognitive-stimulating activities promoted - fall prevention plan reviewed and updated - fear of falling, loss of independence and pain acknowledged - medication list reviewed - modification of home and work environment promoted - participation in rehabilitation therapy encouraged - vision and/or hearing aid use promoted    Notes: The patient is currently getting rehab in SNF.     Plan:Telephone follow up appointment with care management team member scheduled for:  02-21-2021 at 1015 am  Noreene Larsson RN, MSN, [facility] Mobile: [phone]

## 2021-01-17 NOTE — Patient Instructions (Signed)
Visit Information  PATIENT GOALS:  Goals Addressed             This Visit's Progress    RNCM: Prevent Falls and Injury       Follow Up Date 02/21/2021    - add more outdoor lighting - always use handrails on the stairs - always wear low-heeled or flat shoes or slippers with nonskid soles - call the doctor if I am feeling too drowsy - install bathroom grab bars - keep a flashlight by the bed - keep my cell phone with me always - learn how to get back up if I fall - make an emergency alert plan in case I fall - pick up clutter from the floors - use a nonslip pad with throw rugs, or remove them completely - use a cane or walker - use a nightlight in the bathroom - wear my glasses and/or hearing aid    Why is this important?   Most falls happen when it is hard for you to walk safely. Your balance may be off because of an illness. You may have pain in your knees, hip or other joints.  You may be overly tired or taking medicines that make you sleepy. You may not be able to see or hear clearly.  Falls can lead to broken bones, bruises or other injuries.  There are things you can do to help prevent falling.     Notes: 01-17-2021: The patient was recently diagnosed with clonus hyperreflexia and the patient was hospitalized from 01-07-2021 to 01-12-2021, the patient is currently in rehab and will be working with OT/PT/Speech therapy. The patients daughter states she is doing better since hospitalization and medication changes. They are weaning her off of medications that may cause issues and are not needed. Will continue to monitor.          Patient verbalizes understanding of instructions provided today and agrees to view in MyChart.   Telephone follow up appointment with care management team member scheduled for: 02-21-2021 at 1015 am  Alto Denver RN, MSN, CCM Community Care Coordinator [place]  [facility] Mobile: [phone]

## 2021-01-23 ENCOUNTER — Telehealth: Payer: Federal, State, Local not specified - PPO | Admitting: Family Medicine

## 2021-01-23 LAB — BLOOD GAS, VENOUS
Acid-Base Excess: 10.3 mmol/L — ABNORMAL HIGH (ref 0.0–2.0)
Bicarbonate: 39.8 mmol/L — ABNORMAL HIGH (ref 20.0–28.0)
O2 Saturation: 28.9 %
Patient temperature: 37
pCO2, Ven: 79 mmHg (ref 44.0–60.0)
pH, Ven: 7.31 (ref 7.250–7.430)

## 2021-01-25 ENCOUNTER — Other Ambulatory Visit: Payer: Self-pay | Admitting: Family Medicine

## 2021-01-25 NOTE — Telephone Encounter (Signed)
   Notes to clinic:  medication filled by a historical provider  Review for refills   Requested Prescriptions  Pending Prescriptions Disp Refills   nortriptyline (PAMELOR) 50 MG capsule [Pharmacy Med Name: NORTRIPTYLINE HCL 50 MG CAP] 90 capsule 0    Sig: Take 1 capsule (50 mg total) by mouth at bedtime.      Psychiatry:  Antidepressants - Heterocyclics (TCAs) Passed - 01/25/2021  2:23 PM      Passed - Valid encounter within last 6 months    Recent Outpatient Visits           1 month ago Tobacco abuse   [facility] [place], [place], DO   4 months ago Acute recurrent frontal sinusitis   [facility] [place], [place], DO   5 months ago Anxiety   [facility] [place], [place], DO   1 year ago Polyneuropathy, unspecified   [facility] [place], Megan P, DO   1 year ago Essential hypertension   [facility] [place], Oralia Rud, DO       Future Appointments             In 1 week Kirke Corin, Chelsea Aus, MD [facility]

## 2021-01-29 ENCOUNTER — Telehealth: Payer: Federal, State, Local not specified - PPO

## 2021-01-29 ENCOUNTER — Other Ambulatory Visit: Payer: Self-pay | Admitting: Family Medicine

## 2021-01-30 NOTE — Telephone Encounter (Signed)
Pt next apt on 02/06/2021

## 2021-01-30 NOTE — Telephone Encounter (Signed)
Has not been prescribed by this office.

## 2021-02-06 ENCOUNTER — Inpatient Hospital Stay: Payer: Federal, State, Local not specified - PPO | Admitting: Family Medicine

## 2021-02-06 ENCOUNTER — Ambulatory Visit: Payer: Federal, State, Local not specified - PPO | Admitting: Cardiovascular Disease

## 2021-02-09 ENCOUNTER — Telehealth: Payer: Self-pay | Admitting: Family Medicine

## 2021-02-09 ENCOUNTER — Encounter: Payer: Self-pay | Admitting: Family Medicine

## 2021-02-09 ENCOUNTER — Other Ambulatory Visit: Payer: Self-pay

## 2021-02-09 ENCOUNTER — Ambulatory Visit: Payer: Federal, State, Local not specified - PPO | Admitting: Family Medicine

## 2021-02-09 VITALS — BP 135/76 | HR 71 | Temp 98.0°F | Wt 219.0 lb

## 2021-02-09 DIAGNOSIS — R739 Hyperglycemia, unspecified: Secondary | ICD-10-CM

## 2021-02-09 DIAGNOSIS — E538 Deficiency of other specified B group vitamins: Secondary | ICD-10-CM

## 2021-02-09 DIAGNOSIS — R251 Tremor, unspecified: Secondary | ICD-10-CM

## 2021-02-09 DIAGNOSIS — R29898 Other symptoms and signs involving the musculoskeletal system: Secondary | ICD-10-CM

## 2021-02-09 DIAGNOSIS — I10 Essential (primary) hypertension: Secondary | ICD-10-CM

## 2021-02-09 DIAGNOSIS — R0683 Snoring: Secondary | ICD-10-CM

## 2021-02-09 DIAGNOSIS — R569 Unspecified convulsions: Secondary | ICD-10-CM

## 2021-02-09 LAB — BAYER DCA HB A1C WAIVED: HB A1C (BAYER DCA - WAIVED): 6.3 % (ref <7.0)

## 2021-02-09 MED ORDER — TRAZODONE HCL 100 MG PO TABS: 100.0000 mg | ORAL_TABLET | Freq: Every day | ORAL | 1 refills | Status: DC

## 2021-02-09 MED ORDER — OMEPRAZOLE 40 MG PO CPDR: 40.0000 mg | DELAYED_RELEASE_CAPSULE | Freq: Two times a day (BID) | ORAL | 1 refills | Status: DC

## 2021-02-09 MED ORDER — CYANOCOBALAMIN 1000 MCG/ML IJ SOLN: 1000.0000 ug | INTRAMUSCULAR | 12 refills | Status: AC

## 2021-02-09 MED ORDER — CYANOCOBALAMIN 1000 MCG/ML IJ SOLN
1000.0000 ug | Freq: Once | INTRAMUSCULAR | Status: AC
  Administered 2021-02-09: 1000 ug via INTRAMUSCULAR

## 2021-02-09 MED ORDER — CITALOPRAM HYDROBROMIDE 40 MG PO TABS: 40.0000 mg | ORAL_TABLET | Freq: Every day | ORAL | 1 refills | Status: DC

## 2021-02-09 MED ORDER — TRAZODONE HCL 50 MG PO TABS: 50.0000 mg | ORAL_TABLET | Freq: Every day | ORAL | 1 refills | Status: DC

## 2021-02-09 MED ORDER — "INSULIN SYRINGE 30G X 5/16"" 1 ML MISC": 1.0000 | 12 refills | Status: AC

## 2021-02-09 NOTE — Telephone Encounter (Signed)
Called and gave verbal orders per Dr. Johnson.  °

## 2021-02-09 NOTE — Progress Notes (Signed)
BP 135/76   Pulse 71   Temp 98 F (36.7 C) (Oral)   Wt 219 lb (99.3 kg)   SpO2 98%   BMI 37.59 kg/m    Subjective:    Patient ID: Tara [name], female    DOB: 1946/01/15, 75 y.o.   MRN: [id number]  HPI: Tara [name] is a 75 y.o. female  Chief Complaint  Patient presents with   Hospitalization Follow-up   Transition of [facility] Follow up.   Hospital/Facility: [facility], then from SNF [facility]) D/C Physician: Irine Seal, MD D/C Date:  01/12/21 from [facility], from SNF ([facility]) 01/31/21  Records Requested: 02/09/21 Records Received: 02/09/21 Records Reviewed: 02/09/21  Diagnoses on Discharge:   Anxiety   HTN (hypertension)   Hyperlipidemia   IBS (irritable bowel syndrome)   History of aortic valve replacement   B12 deficiency   GERD (gastroesophageal reflux disease)   PAD (peripheral artery disease) ([place])   Difficulty sleeping   History of stroke   Tobacco abuse   TIA (transient ischemic attack)   Tremors of nervous system   Weakness   Acute respiratory failure with hypoxia and hypercarbia ([place])   Fall   Lower extremity edema   Seizure-like activity ([place])  Date of interactive Contact within 48 hours of discharge: 01/12/21 Contact was through: direct  Date of 7 day or 14 day face-to-face visit: 02/09/21   within 14 days  Outpatient Encounter Medications as of 02/09/2021  Medication Sig Note   amLODipine (NORVASC) 10 MG tablet Take 1 tablet (10 mg total) by mouth daily.    aspirin EC 81 MG tablet Take 81 mg by mouth daily. Swallow whole.    B-D TB SYRINGE 1CC/27GX1/2" 27G X 1/2" 1 ML MISC USE AS DIRECTED EVERY 30 DAYS    clopidogrel (PLAVIX) 75 MG tablet Take 1 tablet (75 mg total) by mouth daily.    diclofenac Sodium (VOLTAREN) 1 % GEL Apply 4 g topically 4 (four) times daily.    donepezil (ARICEPT) 5 MG tablet Take 5 mg by mouth daily.    fenofibrate micronized (LOFIBRA) 200 MG capsule TAKE 1 CAPSULE(200 MG) BY MOUTH DAILY     fluticasone (FLONASE) 50 MCG/ACT nasal spray Place 1-2 sprays into both nostrils daily.    Insulin Syringe-Needle U-100 (INSULIN SYRINGE 1CC/30GX5/16") 30G X 5/16" 1 ML MISC 1 each by Does not apply route every 30 (thirty) days.    isosorbide mononitrate (IMDUR) 60 MG 24 hr tablet Take 1 tablet (60 mg total) by mouth in the morning and at bedtime.    lamoTRIgine (LAMICTAL) 25 MG tablet Take 50 mg by mouth 2 (two) times daily. 12/11/2020: 50 mg BID   lidocaine (LIDODERM) 5 % Place 2 patches onto the skin daily.    lisinopril (ZESTRIL) 40 MG tablet TAKE 1 TABLET(40 MG) BY MOUTH TWICE DAILY    loperamide (IMODIUM) 2 MG capsule Take 2 mg by mouth as needed for diarrhea or loose stools.    montelukast (SINGULAIR) 10 MG tablet Take 10 mg by mouth daily.    naproxen (NAPROSYN) 375 MG tablet Take 1 tablet (375 mg total) by mouth 2 (two) times daily as needed for mild pain or moderate pain.    neomycin-bacitracin-polymyxin (NEOSPORIN) OINT Apply 1 application topically as needed for wound care.    nitroGLYCERIN (NITROSTAT) 0.4 MG SL tablet Place 1 tablet (0.4 mg total) under the tongue every 5 (five) minutes as needed for chest pain.    nortriptyline (PAMELOR) 50 MG  capsule Take 50 mg by mouth at bedtime.    pregabalin (LYRICA) 75 MG capsule Take 75 mg by mouth 2 (two) times daily.    [DISCONTINUED] citalopram (CELEXA) 40 MG tablet Take 1 tablet (40 mg total) by mouth daily.    [DISCONTINUED] cyanocobalamin (,VITAMIN B-12,) 1000 MCG/ML injection Inject 1 mL (1,000 mcg total) into the muscle every 30 (thirty) days.    [DISCONTINUED] Insulin Syringe 27G X 1/2" 1 ML MISC 1 each by Does not apply route every 30 (thirty) days.    [DISCONTINUED] omeprazole (PRILOSEC) 40 MG capsule Take 1 capsule (40 mg total) by mouth 2 (two) times daily. TAKE 1 CAPSULE BY MOUTH TWICE DAILY    [DISCONTINUED] oxyCODONE-acetaminophen (PERCOCET/ROXICET) 5-325 MG tablet Take 1 tablet by mouth 4 (four) times daily as needed.     [DISCONTINUED] traZODone (DESYREL) 100 MG tablet Take 100 mg by mouth at bedtime. Take along with one 50 mg tablet for total 150 mg at bedtime    [DISCONTINUED] traZODone (DESYREL) 50 MG tablet Take 1 tablet (50 mg total) by mouth at bedtime. Take along with one 100 mg tablet for total 150 mg at bedtime    citalopram (CELEXA) 40 MG tablet Take 1 tablet (40 mg total) by mouth daily.    cyanocobalamin (,VITAMIN B-12,) 1000 MCG/ML injection Inject 1 mL (1,000 mcg total) into the muscle every 30 (thirty) days.    omeprazole (PRILOSEC) 40 MG capsule Take 1 capsule (40 mg total) by mouth 2 (two) times daily. TAKE 1 CAPSULE BY MOUTH TWICE DAILY    rosuvastatin (CRESTOR) 40 MG tablet Take 1 tablet (40 mg total) by mouth daily.    traZODone (DESYREL) 100 MG tablet Take 1 tablet (100 mg total) by mouth at bedtime. Take along with one 50 mg tablet for total 150 mg at bedtime    traZODone (DESYREL) 50 MG tablet Take 1 tablet (50 mg total) by mouth at bedtime. Take along with one 100 mg tablet for total 150 mg at bedtime    [DISCONTINUED] clonazePAM (KLONOPIN) 0.5 MG tablet Take 0.5 tablets (0.25 mg total) by mouth daily as needed. (Patient not taking: Reported on 02/09/2021)    [DISCONTINUED] nicotine (NICODERM CQ) 21 mg/24hr patch Place 1 patch (21 mg total) onto the skin daily. (Patient not taking: Reported on 02/09/2021)    [EXPIRED] cyanocobalamin ((VITAMIN B-12)) injection 1,000 mcg     No facility-administered encounter medications on file as of 02/09/2021.  Per Hospitalist: "Hospital Course:  1 bilateral lower extremity weakness and tremors/frequent falls  -Questionable etiology. -Head CT/MRI brain with no acute abnormalities. -MRI brain with chronic cortical/subcortical infarcts within the bilateral frontal, parietal, occipital, left temporal lobes.  Subcentimeter focus of diffusion weighted hyperintensity within the left occipital brain infarct territories.  Secondary to chronic hemosiderin deposition or  cortical laminar necrosis at the site, rather than superimposed acute infarct. -TSH within normal limits.  Vitamin B1 stable. -Lower extremity Dopplers negative for DVT. -Patient noted to have low vitamin B12 levels which are being repleted. -Per Dr. Posey Pronto daughter stated weakness in legs have been progressive over the last few months and symptoms started primarily with pain after her vascular surgery intervention and subsequently started having tremors which leads to the falls. -Tremors noted to have progressed to the point where patient has become immobile and wheelchair-bound leading to deconditioning. -Patient noted to have gone through multiple PT sessions and evaluations and therapy. -Wellbutrin noted to have been recently started for smoking cessation. -Patient noted to have been  on nortriptyline for neuropathic pain, trazodone as needed for sleep, Celexa for depression.  Trazodone as needed for insomnia. -Patient noted to be started on Lyrica per neurologist for neuropathic pain. -Lamictal was started due to concern for associated seizure-like events. -Patient's neurologist in the outpatient setting noted to be weaning down Klonopin which was also started due to concern for seizure-like events leading to tremor. -Patient seen in consultation by neurology who assessed patient during PT therapy to witness that tremors and is noted per neurology that patient had sustained clonus in the left [place] upon weightbearing while abating with changing position of her foot and it is noted that when clonus was activated patient became more anxious with further loss of her balance and per neurology these definitely not due to seizure-like activity.   -Per neurology recommending SNF as patient not safe to go home at this point and recommending continuation of current dose of benzodiazepine with very slow benzo wean followed by lamotrigine wean 1 week after benzo have been discontinued.   -Neurology  recommending lamotrigine wean as follows : -Week 1: 25 mg every morning + 50 mg qPM -Week 2: 25 mg twice daily -Week 3: 25 mg qAM -Week 4: Off. -Outpatient follow-up with Dr. Melrose Nakayama, primary neurologist post discharge.   2.  History of seizure-like activity -Patient being followed in the outpatient setting by Dr. Melrose Nakayama. -Patient noted to have been scheduled for 3-hour EEG. -EEG done on 12/03/2020 within normal limits. -No seizure-like activity noted during the hospitalization  3.  Possible OSA -CPAP nightly.   -Will need outpatient sleep study.  4.  Chronic back pain and knee pain -Patient noted to have reported to Dr. Posey Pronto that she does not currently take Percocets however had requested Percocet. -Patient placed on Naprosyn twice daily as needed for pain management.   -Patient's Lidoderm patch was held and will be resumed on discharge.   -Patient will be discharged to skilled nursing facility.   5.  Tremors. -Patient was maintained on home regimen Lamictal 50 mg twice daily. -Outpatient follow-up with primary neurologist.  6.  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 -Patient maintained on Plavix, aspirin for secondary prevention  -Outpatient follow-up with cardiology.  7.  Bilateral lower extremity edema -Improved with a dose of IV Lasix.    8.  Mildly elevated renal dysfunction -Remained stable.  9.  Vitamin B12 deficiency -Patient was maintained on vitamin B12 supplementation."    Diagnostic Tests Reviewed: CLINICAL DATA:  Initial evaluation for acute bilateral lower extremity weakness, myelopathy, frequent falls. B12 deficiency.   EXAM: MRI THORACIC AND LUMBAR SPINE WITHOUT AND WITH CONTRAST   TECHNIQUE: Multiplanar and multiecho pulse sequences of the thoracic and lumbar spine were obtained without and with intravenous contrast.   CONTRAST:  61m GADAVIST GADOBUTROL 1 MMOL/ML IV SOLN   COMPARISON:   Prior radiograph from 05/27/2019.   FINDINGS: MRI THORACIC SPINE FINDINGS   Alignment: Physiologic with preservation of the normal thoracic kyphosis. No significant listhesis.   Vertebrae: Vertebral body height maintained without acute or chronic fracture. Bone marrow signal intensity within normal limits. Subcentimeter benign hemangioma noted within the T11 vertebral body. No worrisome osseous lesions. Mild discogenic reactive endplate change noted about the partially visualized C6-7 interspace. No other abnormal marrow edema or enhancement.   Cord: Normal signal and morphology. No signal changes to suggest subacute combined degeneration. No abnormal enhancement. Normal cord caliber and morphology.   Paraspinal  and other soft tissues: Paraspinous soft tissues within normal limits. Few small benign appearing cyst noted within the visualized kidneys. Visualized visceral structures otherwise unremarkable.   Disc levels:   T1-2:  Unremarkable.   T2-3: Unremarkable.   T3-4:  Unremarkable.   T4-5:  Unremarkable.   T5-6: Small right central disc protrusion mildly indents the ventral thecal sac. No stenosis or cord impingement. Foramina remain patent.   T6-7: Small right paracentral disc protrusion mildly flattens the right ventral thecal sac. No stenosis or cord impingement. Foramina remain patent.   T7-8: Left paracentral disc protrusion flattens and partially effaces the left ventral thecal sac. Mild flattening of the left ventral cord without cord signal changes or significant spinal stenosis. Foramina remain patent.   T8-9: Left paracentral disc protrusion indents the left ventral thecal sac. Mild cord flattening without cord signal changes. No spinal stenosis. Foramina remain patent.   T9-10: Unremarkable.   T10-11: Mild disc bulge. Posterior element hypertrophy. No significant spinal stenosis. Foramina remain patent.   T11-12:  Minimal disc bulge.  No  stenosis.   T12-L1: Small right central disc protrusion indents the right ventral thecal sac. No significant spinal stenosis. Foramina remain patent.   MRI LUMBAR SPINE FINDINGS   Segmentation: Transitional lumbosacral anatomy with sacralization of the L5 vertebral body. Vertebral body count made from the dens. L5-S1 interspace is rudimentary.   Alignment: Trace anterolisthesis of L3 on L4, with 3 mm anterolisthesis of L4 on L5. Findings chronic and facet mediated. Alignment otherwise normal with preservation of the normal lumbar lordosis.   Vertebrae: Vertebral body height maintained without acute or chronic fracture. Bone marrow signal intensity within normal limits. No worrisome osseous lesions. No abnormal marrow edema or enhancement.   Conus medullaris: Extends to the L1 level and appears normal. No abnormal enhancement.   Paraspinal and other soft tissues: Paraspinous soft tissues within normal limits. Multiple scattered benign appearing cyst noted about the visualized kidneys. Visualized visceral structures otherwise unremarkable.   Disc levels:   L1-2: Disc desiccation without disc bulge. Mild facet hypertrophy. No canal or foraminal stenosis.   L2-3: Degenerative intervertebral disc space narrowing with diffuse disc bulge and disc desiccation. Associated reactive endplate change with marginal endplate osteophytic spurring. Changes more pronounced on the right. Moderate facet hypertrophy. Resultant mild spinal stenosis. Moderate right L2 foraminal narrowing. Left neural foramina remains patent.   L3-4: Trace anterolisthesis. Disc bulge with disc desiccation. Superimposed left foraminal disc protrusion impinges upon the exiting left L3 nerve root (series 31, image 13). Moderate facet and ligament flavum hypertrophy with associated small joint effusions. Resultant moderate spinal stenosis. Mild right with moderate left L3 foraminal narrowing.   L4-5:  Anterolisthesis. Disc desiccation with mild disc bulge. Severe facet arthrosis. No canal or lateral recess narrowing. Foramina remain patent.   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 No disc bulge or focal disc herniation. No significant stenosis.   IMPRESSION: MRI THORACIC SPINE IMPRESSION:   1. No acute abnormality within the thoracic spine, with normal MRI appearance of the thoracic spinal cord. No evidence for subacute combined degeneration. No other cord signal changes to suggest myelopathy. 2. Multilevel degenerative spondylosis with small disc protrusions at T5-6 through T8-9 as above. No significant spinal stenosis.   MRI LUMBAR SPINE IMPRESSION:   1. Normal MRI appearance of the conus medullaris and nerve roots of the cauda equina. 2. Multifactorial degenerative changes at L3-4 with resultant moderate spinal stenosis. Superimposed left foraminal disc protrusion impinges on the left  L3 nerve root. 3. 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 4. 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  CLINICAL DATA:  Falls, LEFT hip and LEFT shoulder pain   EXAM: LEFT SHOULDER - 2+ VIEW   COMPARISON:  None   FINDINGS: Osseous demineralization.   AC joint alignment normal.   Visualized LEFT ribs intact.   No glenohumeral fracture, dislocation, or bone destruction.   IMPRESSION: No acute osseous abnormalities.  CLINICAL DATA:  Multiple falls, LEFT hip and LEFT shoulder pain   EXAM: DG HIP (WITH OR WITHOUT PELVIS) 2-3V LEFT   COMPARISON:  None   FINDINGS: Osseous demineralization.   Hip and SI joint spaces preserved.   No acute fracture, dislocation, or bone destruction.   Vascular stent at LEFT common iliac artery.   Scattered atherosclerotic calcifications.   Facet degenerative changes lower lumbar  spine.   IMPRESSION: No acute osseous abnormalities.  CLINICAL DATA:  Lower extremity swelling.   EXAM: BILATERAL LOWER EXTREMITY VENOUS DOPPLER ULTRASOUND   TECHNIQUE: Gray-scale sonography with compression, as well as color and duplex ultrasound, were performed to evaluate the deep venous system(s) from the level of the common femoral vein through the popliteal and proximal calf veins.   COMPARISON:  None.   FINDINGS: VENOUS   Normal compressibility of the common femoral, superficial femoral, and popliteal veins, as well as the visualized calf veins. Visualized portions of profunda femoral vein and great saphenous vein unremarkable. No filling defects to suggest DVT on grayscale or color Doppler imaging. Doppler waveforms show normal direction of venous flow, normal respiratory plasticity and response to augmentation.   OTHER   None.   Limitations: none   IMPRESSION: No evidence of bilateral lower extremity DVT.  CLINICAL DATA:  Mental status change, unknown cause.   EXAM: MRI HEAD WITHOUT AND WITH CONTRAST   TECHNIQUE: Multiplanar, multiecho pulse sequences of the brain and surrounding structures were obtained without and with intravenous contrast.   CONTRAST:  7.85m GADAVIST GADOBUTROL 1 MMOL/ML IV SOLN   COMPARISON:  Brain MRI 05/10/2020.   FINDINGS: Brain:   Intermittently motion degraded examination, limiting evaluation. Most notably, there is moderate/severe motion degradation of the axial T1 weighted postcontrast sequence, coronal T1 weighted postcontrast sequence and sagittal T1 weighted postcontrast sequence.   Mild generalized cerebral and cerebellar atrophy.   Redemonstrated chronic cortical/subcortical infarcts within the bilateral frontal lobes, bilateral parietal lobes, bilateral occipital lobes and posterior left temporal lobe.   Subcentimeter focus of diffusion weighted hyperintensity within the left occipital infarction territory,  which is favored related to chronic hemosiderin deposition at this site rather than superimposed acute infarct.   No evidence of acute infarct elsewhere.   Redemonstrated chronic lacunar infarcts within the bilateral basal ganglia, thalami and cerebellar hemispheres.   Background moderate cerebral white matter chronic small vessel ischemic disease.   No evidence of intracranial mass.   No extra-axial fluid collection.   No midline shift.   No abnormal intracranial enhancement.   Vascular: Expected proximal arterial flow voids.   Skull and upper cervical spine: No focal marrow lesion.   Sinuses/Orbits: Visualized orbits show no acute finding. No significant paranasal sinus disease.   IMPRESSION: Motion degraded examination, as described and limiting evaluation.   Redemonstrated chronic cortical/subcortical infarcts within the bilateral frontal, parietal, occipital and left temporal lobes.   A subcentimeter focus of diffusion weighted hyperintensity within the left occipital lobe infarction territory is  favored secondary to chronic hemosiderin deposition or cortical laminar necrosis at this site, rather than superimposed acute infarct.   Redemonstrated chronic lacunar infarcts within the bilateral basal ganglia, thalami and cerebellar hemispheres.   Background moderate cerebral white matter chronic small vessel ischemic disease, and mild generalized parenchymal atrophy.  CLINICAL DATA:  Altered mental status.  Confusion and weakness   EXAM: PORTABLE CHEST 1 VIEW   COMPARISON:  None.   FINDINGS: The heart size and mediastinal contours are within normal limits. Both lungs are clear. The visualized skeletal structures are unremarkable.   IMPRESSION: No active disease.  Disposition: Home  Consults: Neurology  Discharge Instructions:  Follow-up with MD at skilled nursing facility.  Patient will need a basic metabolic profile done in 1 week to follow-up on  electrolytes and renal function. Follow-up with Dr. Melrose Nakayama, neurology in 2 weeks.  On follow-up patient's klonopin weaning will need to be followed up upon as well as recommendations in-house neurology for Lamictal weaning 1 week after benzodiazepine has been discontinued.  Please see hospital course #1 for neurologist recommendations on Lamictal weaning.  Patient will also need evaluation for possible EMG bilateral lower extremity and 3-hour EEG as recommended per in-house neurologist.  Disease/illness Education: Discussed today  Home Health/Community Services Discussions/Referrals: in place  Establishment or re-establishment of referral orders for community resources: in place  Discussion with other health care providers: None  Assessment and Support of treatment regimen adherence: [place] with: Patient and daughter  Education for self-management, independent living, and ADLs:  Discussed today  Tarita did well at [facility]. Since getting out of SNF, she has been feeling well. She remains tired, but otherwise has not had any more weakness. She has not seen neurology yet. She has her appointment scheduled for the middle of August. She has not had her EMG or her EEG. She is frustrated as they have not been able to get in touch with neurology. She is having PT start today. She has done well coming off her medicine- she is off the klonopin and is working on coming off her lamictal. No other concerns at this time.   ?????SLEEP APNEA Sleep apnea status: unknown Duration: unsure Satisfied with current treatment?:  yes CPAP use:  no Sleep quality with CPAP use: N/A Wakes feeling refreshed:  no Daytime hypersomnolence:  no Fatigue:  yes Insomnia:  yes Good sleep hygiene:  no Difficulty falling asleep:  yes Difficulty staying asleep:  yes Snoring bothers bed partner:  no Observed apnea by bed partner:  yes- at the hospital Obesity:  yes Hypertension: yes  Pulmonary  hypertension:  no Coronary artery disease:  yes   Relevant past medical, surgical, family and social history reviewed and updated as indicated. Interim medical history since our last visit reviewed. Allergies and medications reviewed and updated.  Review of Systems  Constitutional:  Positive for fatigue. Negative for activity change, appetite change, chills, diaphoresis, fever and unexpected weight change.  Respiratory: Negative.    Cardiovascular: Negative.   Gastrointestinal: Negative.   Musculoskeletal: Negative.   Neurological: Negative.   Psychiatric/Behavioral: Negative.     Per HPI unless specifically indicated above     Objective:    BP 135/76   Pulse 71   Temp 98 F (36.7 C) (Oral)   Wt 219 lb (99.3 kg)   SpO2 98%   BMI 37.59 kg/m   Wt Readings from Last 3 Encounters:  02/09/21 219 lb (99.3 kg)  01/12/21 194 lb 3.6 oz (88.1  kg)  10/10/20 172 lb (78 kg)    Physical Exam Vitals and nursing note reviewed.  Constitutional:      General: She is not in acute distress.    Appearance: Normal appearance. She is not ill-appearing, toxic-appearing or diaphoretic.  HENT:     Head: Normocephalic and atraumatic.     Right Ear: External ear normal.     Left Ear: External ear normal.     Nose: Nose normal.     Mouth/Throat:     Mouth: Mucous membranes are moist.     Pharynx: Oropharynx is clear.  Eyes:     General: No scleral icterus.       Right eye: No discharge.        Left eye: No discharge.     Extraocular Movements: Extraocular movements intact.     Conjunctiva/sclera: Conjunctivae normal.     Pupils: Pupils are equal, round, and reactive to light.  Cardiovascular:     Rate and Rhythm: Normal rate and regular rhythm.     Pulses: Normal pulses.     Heart sounds: Normal heart sounds. No murmur heard.   No friction rub. No gallop.  Pulmonary:     Effort: Pulmonary effort is normal. No respiratory distress.     Breath sounds: Normal breath sounds. No stridor.  No wheezing, rhonchi or rales.  Chest:     Chest wall: No tenderness.  Musculoskeletal:        General: Normal range of motion.     Cervical back: Normal range of motion and neck supple.  Skin:    General: Skin is warm and dry.     Capillary Refill: Capillary refill takes less than 2 seconds.     Coloration: Skin is not jaundiced or pale.     Findings: No bruising, erythema, lesion or rash.  Neurological:     General: No focal deficit present.     Mental Status: She is alert and oriented to person, place, and time. Mental status is at baseline.  Psychiatric:        Mood and Affect: Mood normal.        Behavior: Behavior normal.        Thought Content: Thought content normal.        Judgment: Judgment normal.    Results for orders placed or performed in visit on 02/09/21  Lamotrigine level  Result Value Ref Range   Lamotrigine Lvl WILL FOLLOW   Basic metabolic panel  Result Value Ref Range   Glucose 152 (H) 65 - 99 mg/dL   BUN 27 8 - 27 mg/dL   Creatinine, Ser 1.16 (H) 0.57 - 1.00 mg/dL   eGFR 50 (L) >59 mL/min/1.73   BUN/Creatinine Ratio 23 12 - 28   Sodium 144 134 - 144 mmol/L   Potassium 4.7 3.5 - 5.2 mmol/L   Chloride 108 (H) 96 - 106 mmol/L   CO2 23 20 - 29 mmol/L   Calcium 9.0 8.7 - 10.3 mg/dL  B12  Result Value Ref Range   Vitamin B-12 >2000 (H) 232 - 1245 pg/mL  CBC with Differential/Platelet  Result Value Ref Range   WBC 5.3 3.4 - 10.8 x10E3/uL   RBC 4.72 3.77 - 5.28 x10E6/uL   Hemoglobin 12.8 11.1 - 15.9 g/dL   Hematocrit 40.5 34.0 - 46.6 %   MCV 86 79 - 97 fL   MCH 27.1 26.6 - 33.0 pg   MCHC 31.6 31.5 - 35.7 g/dL   RDW 14.4 11.7 -  15.4 %   Platelets 188 150 - 450 x10E3/uL   Neutrophils 62 Not Estab. %   Lymphs 29 Not Estab. %   Monocytes 8 Not Estab. %   Eos 1 Not Estab. %   Basos 0 Not Estab. %   Neutrophils Absolute 3.2 1.4 - 7.0 x10E3/uL   Lymphocytes Absolute 1.6 0.7 - 3.1 x10E3/uL   Monocytes Absolute 0.4 0.1 - 0.9 x10E3/uL   EOS (ABSOLUTE)  0.1 0.0 - 0.4 x10E3/uL   Basophils Absolute 0.0 0.0 - 0.2 x10E3/uL   Immature Granulocytes 0 Not Estab. %   Immature Grans (Abs) 0.0 0.0 - 0.1 x10E3/uL  Bayer DCA Hb A1c Waived  Result Value Ref Range   HB A1C (BAYER DCA - WAIVED) 6.3 <7.0 %      Assessment & Plan:   Problem List Items Addressed This Visit       Cardiovascular and Mediastinum   HTN (hypertension)    Under good control on current regimen. Continue current regimen. Continue to monitor. Call with any concerns. Refills given. Labs drawn today.         Relevant Orders   Basic metabolic panel (Completed)     Other   B12 deficiency    B12 shot given today. Rechecking labs. Await results. Treat as needed.        Relevant Orders   B12 (Completed)   CBC with Differential/Platelet (Completed)   Seizure-like activity (Anton) - Primary    Doing well off klonopin and weaning down on lamictal. Needs follow up with neurology. We will try to get  Her in ASAP. Call with any concerns. Continue to monitor.        Relevant Orders   Lamotrigine level (Completed)   Other Visit Diagnoses     Weakness of both lower limbs       Needs to get in with neurology- we will reach out to see if we can get her in sooner. Await their input.    Tremor       Needs to get in with neurology- we will reach out to see if we can get her in sooner. Await their input.    Snoring       Suggested after hospitalization. Will get sleep study. Await results.    Relevant Orders   Ambulatory referral to Sleep Studies   Hyperglycemia       Will check A1c. Await results.    Relevant Orders   Bayer DCA Hb A1c Waived (Completed)        Follow up plan: Return in about 4 months (around 06/12/2021) for physical.

## 2021-02-09 NOTE — Telephone Encounter (Signed)
Ok for verbal orders ?

## 2021-02-09 NOTE — Telephone Encounter (Signed)
Please advise 

## 2021-02-09 NOTE — Telephone Encounter (Signed)
Home Health Verbal Orders - Caller/Agency: [name]// [facility] Callback Number: [phone] secure Requesting OT/PT/Skilled Nursing/Social Work/Speech Therapy: nursing Frequency: 1w 9, 2 PRNs

## 2021-02-11 NOTE — Assessment & Plan Note (Signed)
Doing well off klonopin and weaning down on lamictal. Needs follow up with neurology. We will try to get  Her in ASAP. Call with any concerns. Continue to monitor.

## 2021-02-11 NOTE — Assessment & Plan Note (Signed)
B12 shot given today. Rechecking labs. Await results. Treat as needed.

## 2021-02-11 NOTE — Assessment & Plan Note (Signed)
Under good control on current regimen. Continue current regimen. Continue to monitor. Call with any concerns. Refills given. Labs drawn today.   

## 2021-02-13 LAB — BASIC METABOLIC PANEL
BUN/Creatinine Ratio: 23 (ref 12–28)
BUN: 27 mg/dL (ref 8–27)
CO2: 23 mmol/L (ref 20–29)
Calcium: 9 mg/dL (ref 8.7–10.3)
Chloride: 108 mmol/L — ABNORMAL HIGH (ref 96–106)
Creatinine, Ser: 1.16 mg/dL — ABNORMAL HIGH (ref 0.57–1.00)
Glucose: 152 mg/dL — ABNORMAL HIGH (ref 65–99)
Potassium: 4.7 mmol/L (ref 3.5–5.2)
Sodium: 144 mmol/L (ref 134–144)
eGFR: 50 mL/min/{1.73_m2} — ABNORMAL LOW (ref >59)

## 2021-02-13 LAB — CBC WITH DIFFERENTIAL/PLATELET
Basophils Absolute: 0 10*3/uL (ref 0.0–0.2)
Basos: 0 %
EOS (ABSOLUTE): 0.1 10*3/uL (ref 0.0–0.4)
Eos: 1 %
Hematocrit: 40.5 % (ref 34.0–46.6)
Hemoglobin: 12.8 g/dL (ref 11.1–15.9)
Immature Grans (Abs): 0 10*3/uL (ref 0.0–0.1)
Immature Granulocytes: 0 %
Lymphocytes Absolute: 1.6 10*3/uL (ref 0.7–3.1)
Lymphs: 29 %
MCH: 27.1 pg (ref 26.6–33.0)
MCHC: 31.6 g/dL (ref 31.5–35.7)
MCV: 86 fL (ref 79–97)
Monocytes Absolute: 0.4 10*3/uL (ref 0.1–0.9)
Monocytes: 8 %
Neutrophils Absolute: 3.2 10*3/uL (ref 1.4–7.0)
Neutrophils: 62 %
Platelets: 188 10*3/uL (ref 150–450)
RBC: 4.72 x10E6/uL (ref 3.77–5.28)
RDW: 14.4 % (ref 11.7–15.4)
WBC: 5.3 10*3/uL (ref 3.4–10.8)

## 2021-02-13 LAB — LAMOTRIGINE LEVEL: Lamotrigine Lvl: <1 ug/mL — ABNORMAL LOW (ref 2.0–20.0)

## 2021-02-13 LAB — VITAMIN B12: Vitamin B-12: >2000 pg/mL — ABNORMAL HIGH (ref 232–1245)

## 2021-02-14 ENCOUNTER — Telehealth: Payer: Self-pay | Admitting: Family Medicine

## 2021-02-14 NOTE — Telephone Encounter (Signed)
Copied from CRM [id number]. Topic: Quick Communication - Home Health Verbal Orders >> Feb 14, 2021  2:34 PM Jaquita Rector A wrote: Caller/Agency: Thayer Ohm // [facility]  Callback Number: [phone] Ok to [facility]  Requesting OT/PT/Skilled Nursing/Social Work/Speech Therapy: PT  Frequency: 2 w 4, 1 w 4

## 2021-02-15 NOTE — Telephone Encounter (Signed)
Left detailed message providing the OK for verbal orders for the patient per Dr.Johnson. Jama Flavors to give our office a call back if he has any questions or concerns.

## 2021-02-15 NOTE — Telephone Encounter (Signed)
OK for verbal orders?

## 2021-02-16 ENCOUNTER — Telehealth: Payer: Self-pay | Admitting: Family Medicine

## 2021-02-16 NOTE — Telephone Encounter (Signed)
FYI

## 2021-02-16 NOTE — Telephone Encounter (Signed)
Noreene Larsson from [facility] called to let Dr. Laural Benes know that they were not able to complete social worker eval visit today and they will attempt visit next week

## 2021-02-21 ENCOUNTER — Telehealth: Payer: Federal, State, Local not specified - PPO | Admitting: General Practice

## 2021-02-21 ENCOUNTER — Ambulatory Visit (INDEPENDENT_AMBULATORY_CARE_PROVIDER_SITE_OTHER): Payer: Federal, State, Local not specified - PPO | Admitting: General Practice

## 2021-02-21 DIAGNOSIS — R413 Other amnesia: Secondary | ICD-10-CM

## 2021-02-21 DIAGNOSIS — I10 Essential (primary) hypertension: Secondary | ICD-10-CM | POA: Diagnosis not present

## 2021-02-21 DIAGNOSIS — Z8673 Personal history of transient ischemic attack (TIA), and cerebral infarction without residual deficits: Secondary | ICD-10-CM

## 2021-02-21 DIAGNOSIS — F339 Major depressive disorder, recurrent, unspecified: Secondary | ICD-10-CM | POA: Diagnosis not present

## 2021-02-21 DIAGNOSIS — W19XXXD Unspecified fall, subsequent encounter: Secondary | ICD-10-CM

## 2021-02-21 DIAGNOSIS — E782 Mixed hyperlipidemia: Secondary | ICD-10-CM | POA: Diagnosis not present

## 2021-02-21 DIAGNOSIS — R569 Unspecified convulsions: Secondary | ICD-10-CM

## 2021-02-21 NOTE — Chronic Care Management (AMB) (Signed)
Chronic Care Management   CCM RN Visit Note  02/21/2021 Name: Tara [name] MRN: [id number] DOB: 09-01-1945  Subjective: Tara [name] is a 75 y.o. year old female who is a primary care patient of Valerie Roys, DO. The care management team was consulted for assistance with disease management and care coordination needs.    Engaged with patient by telephone for follow up visit in response to provider referral for case management and/or care coordination services.   Consent to Services:  The patient was given information about Chronic Care Management services, agreed to services, and gave verbal consent prior to initiation of services.  Please see initial visit note for detailed documentation.   Patient agreed to services and verbal consent obtained.   Assessment: Review of patient past medical history, allergies, medications, health status, including review of consultants reports, laboratory and other test data, was performed as part of comprehensive evaluation and provision of chronic care management services.   SDOH (Social Determinants of Health) assessments and interventions performed:    CCM Care Plan  No Known Allergies  Outpatient Encounter Medications as of 02/21/2021  Medication Sig Note   amLODipine (NORVASC) 10 MG tablet Take 1 tablet (10 mg total) by mouth daily.    aspirin EC 81 MG tablet Take 81 mg by mouth daily. Swallow whole.    B-D TB SYRINGE 1CC/27GX1/2" 27G X 1/2" 1 ML MISC USE AS DIRECTED EVERY 30 DAYS    citalopram (CELEXA) 40 MG tablet Take 1 tablet (40 mg total) by mouth daily.    clopidogrel (PLAVIX) 75 MG tablet Take 1 tablet (75 mg total) by mouth daily.    cyanocobalamin (,VITAMIN B-12,) 1000 MCG/ML injection Inject 1 mL (1,000 mcg total) into the muscle every 30 (thirty) days.    diclofenac Sodium (VOLTAREN) 1 % GEL Apply 4 g topically 4 (four) times daily.    donepezil (ARICEPT) 5 MG tablet Take 5 mg by mouth daily.    fenofibrate  micronized (LOFIBRA) 200 MG capsule TAKE 1 CAPSULE(200 MG) BY MOUTH DAILY    fluticasone (FLONASE) 50 MCG/ACT nasal spray Place 1-2 sprays into both nostrils daily.    Insulin Syringe-Needle U-100 (INSULIN SYRINGE 1CC/30GX5/16") 30G X 5/16" 1 ML MISC 1 each by Does not apply route every 30 (thirty) days.    isosorbide mononitrate (IMDUR) 60 MG 24 hr tablet Take 1 tablet (60 mg total) by mouth in the morning and at bedtime.    lamoTRIgine (LAMICTAL) 25 MG tablet Take 50 mg by mouth 2 (two) times daily. 12/11/2020: 50 mg BID   lidocaine (LIDODERM) 5 % Place 2 patches onto the skin daily.    lisinopril (ZESTRIL) 40 MG tablet TAKE 1 TABLET(40 MG) BY MOUTH TWICE DAILY    loperamide (IMODIUM) 2 MG capsule Take 2 mg by mouth as needed for diarrhea or loose stools.    montelukast (SINGULAIR) 10 MG tablet Take 10 mg by mouth daily.    naproxen (NAPROSYN) 375 MG tablet Take 1 tablet (375 mg total) by mouth 2 (two) times daily as needed for mild pain or moderate pain.    neomycin-bacitracin-polymyxin (NEOSPORIN) OINT Apply 1 application topically as needed for wound care.    nitroGLYCERIN (NITROSTAT) 0.4 MG SL tablet Place 1 tablet (0.4 mg total) under the tongue every 5 (five) minutes as needed for chest pain.    nortriptyline (PAMELOR) 50 MG capsule Take 50 mg by mouth at bedtime.    omeprazole (PRILOSEC) 40 MG capsule Take 1 capsule (40  mg total) by mouth 2 (two) times daily. TAKE 1 CAPSULE BY MOUTH TWICE DAILY    pregabalin (LYRICA) 75 MG capsule Take 75 mg by mouth 2 (two) times daily.    rosuvastatin (CRESTOR) 40 MG tablet Take 1 tablet (40 mg total) by mouth daily.    traZODone (DESYREL) 100 MG tablet Take 1 tablet (100 mg total) by mouth at bedtime. Take along with one 50 mg tablet for total 150 mg at bedtime    traZODone (DESYREL) 50 MG tablet Take 1 tablet (50 mg total) by mouth at bedtime. Take along with one 100 mg tablet for total 150 mg at bedtime    No facility-administered encounter  medications on file as of 02/21/2021.    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   History of stroke 07/25/2020   Memory loss, short term 07/25/2020   Visual disturbance 07/25/2020   Tremors of nervous system 07/25/2020   Difficulty walking 03/09/2020   TIA (transient ischemic attack) 03/09/2020   Polyneuropathy, unspecified 12/08/2019   Difficulty sleeping 10/29/2019   Numbness 10/29/2019   Right leg numbness 10/29/2019   Numbness and tingling in left hand 10/29/2019   Lymphedema 10/26/2019   Atherosclerosis of native arteries of extremity with intermittent claudication ([place]) 10/26/2019   Nerve pain 10/12/2019   Memory loss 10/12/2019   PAD (peripheral artery disease) ([place]) 08/02/2019   Temporal arteritis (HCC)    Other intervertebral disc degeneration, thoracic region 06/21/2019   GERD (gastroesophageal reflux disease) 06/21/2019   Angiodysplasia of intestinal tract    Intestinal bypass or anastomosis status    History of aortic valve replacement 02/07/2019   B12 deficiency 02/07/2019   Anxiety 02/05/2019   HTN (hypertension) 02/05/2019   Hyperlipidemia 02/05/2019   IBS (irritable bowel syndrome) 02/05/2019   Arthritis of both knees 02/05/2019   CTS (carpal tunnel syndrome) 02/05/2019    Conditions to be addressed/monitored:HTN, HLD, Depression, and Blindness, history of stroke with residual effects, seizures and fall prevention   Care Plan : RNCM: HLD management  Updates made by Vanita Ingles since 02/21/2021 12:00 AM     Problem: RNCM: HLD management   Priority: Medium     Long-Range Goal: RNCM: HLD Coping Skills Enhanced   Start Date: 08/08/2020  Expected End Date: 09/14/2021  This  Visit's Progress: On track  Recent Progress: On track  Priority: Medium  Note:   Current Barriers:  Poorly controlled hyperlipidemia, complicated by Poorly controlled hyperlipidemia, complicated by smoker, memory changes, several chronic conditions  Current antihyperlipidemic regimen: Crestor 40 mg QD Most recent lipid panel:     Component Value Date/Time   CHOL 113 09/12/2020 1415   TRIG 103 09/12/2020 1415   HDL 44 09/12/2020 1415   CHOLHDL 5.5 07/13/2019 1615   VLDL 48 (H) 07/13/2019 1615   LDLCALC 50 09/12/2020 1415   LDLDIRECT 145.7 (H) 07/13/2019 1615   ASCVD risk enhancing conditions: age >67, HTN, history of strokes, current smoker Unable to independently manage HLD  Unable to self administer medications as prescribed Unable to perform ADLs independently Unable to perform IADLs independently Does not contact provider office for questions/concerns RN Care Manager Clinical Goal(s):  patient will work with Consulting civil engineer, providers,  and care team towards execution of optimized self-health management plan patient will verbalize understanding of plan for effective management of HLD patient will work with [facility], CCM team and pcp  to address needs related to effective management of HLD  patient will take all medications exactly as prescribed and will call provider for medication related questions patient will attend all scheduled medical appointments: 06-08-2021 at 11 am patient will demonstrate improved health management independence the patient will demonstrate ongoing self health care management ability Interventions: Collaboration with Valerie Roys, DO regarding development and update of comprehensive plan of care as evidenced by provider attestation and co-signature Inter-disciplinary care team collaboration (see longitudinal plan of care) Medication review performed; medication list updated in electronic medical record.  Inter-disciplinary care team collaboration (see  longitudinal plan of care) Referred to pharmacy team for assistance with HLD medication management Evaluation of current treatment plan related to HLD and patient's adherence to plan as established by provider. 02-21-2021: The patient is doing well and no issues related to HLD. Will continue to monitor.  Advised patient to call the office when patient is discharged from SNF and make a follow up appointment with pcp, also to call the office for any questions or concerns.  Provided education to patient re: heart healthy diet, medications compliance, and working with the CCM team to optimize health and well being.  Reviewed medications with patient and discussed compliance Discussed plans with patient for ongoing care management follow up and provided patient with direct contact information for care management team Patient Goals/Self-Care Activities: - call for medicine refill 2 or 3 days before it runs out - call if I am sick and can't take my medicine - keep a list of all the medicines I take; vitamins and herbals too - learn to read medicine labels - use a pillbox to sort medicine - use an alarm clock or phone to remind me to take my medicine - change to whole grain breads, cereal, pasta - drink 6 to 8 glasses of water each day - eat 3 to 5 servings of fruits and vegetables each day - eat 5 or 6 small meals each day - eat fish at least once per week - fill half the plate with nonstarchy vegetables - limit fast food meals to no more than 1 per week - manage portion size - prepare main meal at home 3 to 5 days each week - read food labels for fat, fiber, carbohydrates and portion size - be open to making changes - I can manage, know and watch for signs of a heart attack - if I have chest pain, call for help - learn about small changes that will make a big difference - learn my personal risk factors  Follow Up Plan: Telephone follow up appointment with care management team member scheduled  for: 04-25-2021 at 1145 am      Problem: RNCM: Vision Changes Quality of Life (General Plan of Care)   Priority: High     Long-Range Goal: RNCM: Vision Changes Quality of Life Maintained   Start Date: 08/08/2020  Expected End Date: 09/14/2021  This Visit's Progress: On track  Recent Progress: On track  Priority: High  Note:   Current Barriers:  Knowledge Deficits related to vision changes due to CVA Care Coordination needs related to resources in the community  in a patient with changes in vision and loss of vision  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 Cognitive Deficits Unable to independently  manage care due to changes in vision Unable to self administer medications as prescribed Does not attend all scheduled provider appointments Does not adhere to prescribed medication regimen Lacks social connections Unable to perform ADLs independently Unable to perform IADLs independently Does not maintain contact with provider office Does not contact provider office for questions/concerns  Nurse Case Manager Clinical Goal(s):   patient will verbalize understanding of plan for effective management of meeting needs in patient with vision changes   patient will work with [facility], CCM team, and specialist  to address needs related to vision loss  patient will attend all scheduled medical appointments: eye specialist on 08-10-2020, appointment completed. The patient declared legally blind Interventions:  1:1 collaboration with Valerie Roys, DO regarding development and update of comprehensive plan of care as evidenced by provider attestation and co-signature Inter-disciplinary care team collaboration (see longitudinal plan of care) Evaluation of current treatment plan related to loss of vision  and patient's adherence to plan as established by provider. 08-16-2020: Saw eye specialist last week. Was diagnosed with cortical blindness. The patient if not  handling well, but has support from her daughter. 09-19-2020: The daughter states things are about the same. They are adjusting to the new diagnosis and are taking things a day at a time. 11-14-2020: The patients daughter states the patient is not seeing well at all. She gets frustrated and sometimes thinks she has different shoes on and the daughter has to redirect her. She is dealing with it the best she can and it is on a day to day basis. 01-17-2021: The patient is currently in SNF receiving rehab. She is actually doing better right now and adjusting to her chronic conditions. Is receiving OT/PT/Speech. The patient had been having multiple falls. Since changes in medications the daughter reports improved memory. 02-21-2021: The patient is home from [facility] and doing well. They are still waiting to hear from the neurologist. Ask for a new referral to a different neurologist.  Advised patient to keep appointment with eye doctor, call office for changes in condition  Provided education to patient re: services for the blind and working with resources to help with patients expressed needs due to vision loss. 08-16-2020: Review of resources available for services for the blind. This is all new to the daughter and the patient. Will continue to monitor for changes and needs. 09-19-2020: The daughter has resources. Also sending text message so the daughter can sign up for my chart to view notes and records. Text message sent to [phone]. Education provided for the daughter.  11-14-2020: The patients daughter is set up for my chart now and can view notes. 02-21-2021: The daughter has resources and is appreciative of the follow up from the CCM team. Will continue to monitor for needs.  Discussed plans with patient for ongoing care management follow up and provided patient with direct contact information for care management team Reviewed scheduled/upcoming provider appointments including: eye specialist on 08-15-2020 at  0900 am. 11-14-2020: Will see pcp on 12-11-2020 at 3 pm, has upcoming sleep study in May. 01-17-2021: The patient had a follow up scheduled for 01-23-2021 but due to being in SNF will need to changes this. In basket message sent to the [facility] admin staff to reach out to the daughter to reschedule. 02-21-2021: Saw pcp on 02-09-2021, next appointment with pcp on 06-08-2021 at 11 am.  Patient Goals/Self-Care Activities Over the next 120 days, patient will:  - Patient will self administer medications as prescribed  Patient will attend all scheduled provider appointments Patient will call pharmacy for medication refills Patient will continue to perform ADL's independently Patient will continue to perform IADL's independently Patient will call provider office for new concerns or questions Patient will work with BSW to address care coordination needs and will continue to work with the clinical team to address health care and disease management related needs.    - affirmation provided - community involvement promoted - expression of thoughts about present/future encouraged - independence in all possible areas promoted - patient strengths promoted - self-expression encouraged - sleep hygiene techniques encouraged - social relationships promoted Follow Up Plan: Telephone follow up appointment with care management team member scheduled for: 05-09-2021 at 1145 am        Care Plan : RNCM: Stroke (Adult)  Updates made by Vanita Ingles since 02/21/2021 12:00 AM     Problem: RNCM: Emotional Adjustment to Disease (Stroke)   Priority: High     Long-Range Goal: RNCM: Effective management of stroke   Start Date: 08/08/2020  Expected End Date: 09/14/2021  This Visit's Progress: On track  Recent Progress: On track  Priority: High  Note:   Current Barriers:  Knowledge Deficits related to effective management of s/p stroke and TIas Care Coordination needs related to community resources and expressed needs  in a  patient with past stroke and changes in condition related to TIAs and mini-strokes  Chronic Disease Management support and education needs related to management of stroke and prevention of new HA and/orstroke  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 Cognitive Deficits Unable to independently manage care due to "mini-strokes" and TIAs Unable to self administer medications as prescribed Lacks social connections Unable to perform ADLs independently Unable to perform IADLs independently Does not contact provider office for questions/concerns  Nurse Case Manager Clinical Goal(s):  patient will verbalize understanding of plan for effective management of CVA and residual effects from TIAs  patient will work with [facility], CCM team and pcp to address needs related to care of patient in the home with post residual deficits and decline in condition- 01-17-2021: The patient is currently in rehab post hospital discharge. 02-21-2021: The daughter has not heard back from the neurologist office. Asking for assistance with obtaining a new neurologist.   patient will demonstrate a decrease in TIA episodes  exacerbations as evidenced by no new CVA events  patient will work with CM team pharmacist to reconcile medications, polypharmacy and dubplications   patient will work with care guides  (community agency) to help with expressed needs: transportation, food resources, and community resources   Interventions:  1:1 collaboration with Valerie Roys, DO regarding development and update of comprehensive plan of care as evidenced by provider attestation and co-signature Inter-disciplinary care team collaboration (see longitudinal plan of care) Provided education to patient re: talking to care guides about resources, calling insurance to inquire about life alert, working with CCM team to meet expressed needs  Reviewed medications with patient and discussed compliance and pharmacy  referral. 08-16-2020: The pharmacist has reviewed with the daughter medications list and the daughter is more at ease with medications. The pharmacist will continue to work with the patients daughter. 11-14-2020: The daughter [name] denies any medication concerns today. Review of medication and daughter updated the RNCM on changes recently from the neurology. 01-17-2021: The patients daughter [name] states that there have been several medication changes since the patient has been in the hospital. The patient is tapering off of  Klonopin and Lamictal and is no longer taking Buspar. The patient is in SNF for rehab. Will update medications list post discharge from SNF. 02-21-2021: The patient is compliant with current medications regimen. Is trying to obtain an appointment with neurologist.  Collaborated with pcp concerning the patients daughter asking for a referral to a new neurologist. The patients daughter has not heard from the neurologist and has not been able to obtain an appointment post discharge for the patient for evaluation. The patient is continuing to have seizures. Will discuss new referral with the pcp.(02-21-2021)   Discussed plans with patient for ongoing care management follow up and provided patient with direct contact information for care management team Social Work referral for needs in the home, placement questions- currently working with LCSW. 02-21-2021: Ongoing support and eduction from the LCSW. Pharmacy referral for polypharmacy, medication reconciliation, duplicate medications  Review of new stroke and "mini-stroke" events. Per [name] they did a MRI and it revealed the patient had not had any new episodes of stroke. She has been diagnosed as having Clonus hyperreactivity and this is causing a lot of her issues including the frequent falls. 02-21-2021: The patient is having multiple seizures. Saw pcp on 02-09-2021 and is waiting for neurology appointment. The daughter states she has been unable to get an  appointment with current neurologist. Will talk to pcp about a new referral. The patient is stable but the daughter is concerned about the seizure activity.   Patient Goals/Self-Care Activities Over the next 120 days, patient will:  - Patient will self administer medications as prescribed Patient will attend all scheduled provider appointments Patient will call pharmacy for medication refills Patient will continue to perform ADL's independently Patient will continue to perform IADL's independently Patient will call provider office for new concerns or questions Patient will work with BSW to address care coordination needs and will continue to work with the clinical team to address health care and disease management related needs.   - caregiver stress acknowledged - caregiver support provided - counseling provided - decision-making supported - depression screen reviewed - family care conference arranged - financial counseling provided - goal-setting facilitated - positive reinforcement provided - problem-solving facilitated - self-care encouraged - self-reflection promoted - verbalization of feelings encouraged   Follow Up Plan: Telephone follow up appointment with care management team member scheduled for: 05-09-2021 at 1145 am        Care Plan : RNCM: Hypertension (Adult)  Updates made by Vanita Ingles since 02/21/2021 12:00 AM     Problem: RNCM: Hypertension (Hypertension)   Priority: Medium     Long-Range Goal: RNCM: Hypertension Monitored   Start Date: 08/08/2020  This Visit's Progress: On track  Recent Progress: On track  Priority: Medium  Note:   Objective:  Last practice recorded BP readings:  BP Readings from Last 3 Encounters:  02/09/21 135/76  01/12/21 138/68  12/13/20 140/75    Most recent eGFR/CrCl: No results found for: EGFR  No components found for: CRCL Current Barriers:  Knowledge Deficits related to basic understanding of hypertension  pathophysiology and self care management Knowledge Deficits related to understanding of medications prescribed for management of hypertension Cognitive Deficits Unable to independently manage HTN Unable to self administer medications as prescribed Unable to perform ADLs independently Unable to perform IADLs independently Does not contact provider office for questions/concerns Case Manager Clinical Goal(s):  patient will verbalize understanding of plan for hypertension management patient will attend all scheduled medical appointments: will need to schedule  an appointment post discharge from SNF patient will demonstrate improved adherence to prescribed treatment plan for hypertension as evidenced by taking all medications as prescribed, monitoring and recording blood pressure as directed, adhering to low sodium/DASH diet patient will demonstrate improved health management independence as evidenced by checking blood pressure as directed and notifying PCP if SBP>150 or DBP > 90, taking all medications as prescribe, and adhering to a low sodium diet as discussed. patient will verbalize basic understanding of hypertension disease process and self health management plan as evidenced by compliance with heart healthy diet, compliance with medications and working with the CCM team to manage health and well being.  Interventions:  Collaboration with Valerie Roys, DO regarding development and update of comprehensive plan of care as evidenced by provider attestation and co-signature Inter-disciplinary care team collaboration (see longitudinal plan of care) Evaluation of current treatment plan related to hypertension self management and patient's adherence to plan as established by provider. 02-21-2021: Blood pressures are stable at this time.  Provided education to patient re: stroke prevention, s/s of heart attack and stroke, DASH diet, complications of uncontrolled blood pressure. 02-21-2021: The patient is  compliant with heart healthy diet  Reviewed medications with patient and discussed importance of compliance. 02-21-2021: The patients daughter manages her medications and the patient is compliant with medications.  Discussed plans with patient for ongoing care management follow up and provided patient with direct contact information for care management team Advised patient, providing education and rationale, to monitor blood pressure daily and record, calling PCP for findings outside established parameters.  Self-Care Activities: - Self administers medications as prescribed Attends all scheduled provider appointments Calls provider office for new concerns, questions, or BP outside discussed parameters Checks BP and records as discussed Follows a low sodium diet/DASH diet Patient Goals: - check blood pressure weekly - choose a place to take my blood pressure (home, clinic or office, retail store) - write blood pressure results in a log or diary - agree on reward when goals are met - agree to work together to make changes - ask questions to understand - have a family meeting to talk about healthy habits - learn about high blood pressure  Follow Up Plan: Telephone follow up appointment with care management team member scheduled for: 05-09-2021 at 1145 am    Care Plan : RNCM: Depression (Adult)  Updates made by Vanita Ingles since 02/21/2021 12:00 AM     Problem: RNCM: Depression Identification (Depression)   Priority: Medium     Long-Range Goal: RNCM: Depressive Symptoms Identified   Start Date: 08/08/2020  Expected End Date: 09/14/2021  This Visit's Progress: On track  Recent Progress: On track  Priority: Medium  Note:   Current Barriers:  Knowledge Deficits related to depression and anxiety  Chronic Disease Management support and education needs related to depression and anxiety  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 Non-adherence to scheduled  provider appointments Non-adherence to prescribed medication regimen Unable to independently manage depression and anxiety  Unable to self administer medications as prescribed Does not attend all scheduled provider appointments Does not adhere to prescribed medication regimen Lacks social connections Unable to perform ADLs independently Unable to perform IADLs independently Does not maintain contact with provider office Does not contact provider office for questions/concerns  Nurse Case Manager Clinical Goal(s):   patient will verbalize understanding of plan for management of depression and anxiety  patient will work with [place], CCM, and pcp to address needs related  to depression and anxiety  patient will attend all scheduled medical appointments: will call for appointment with pcp  Interventions:  1:1 collaboration with Valerie Roys, DO regarding development and update of comprehensive plan of care as evidenced by provider attestation and co-signature Inter-disciplinary care team collaboration (see longitudinal plan of care) Evaluation of current treatment plan related to anxiety and depression  and patient's adherence to plan as established by provider. 08-16-2020: Saw neurologist and eye specialist. 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 Will need CCM team support. 02-21-2021: [name] states that they are taking it day by day. She has good days and not so good days. The daughter is with the patient 24/7, she can tell when she is having an off day. The patient continues to have mini strokes and seizures.  The daughter knows to call for changes. States some days are worse than other with her depression. She is having a good day today. Working with LCSW also. Knows resources are available. Has some positive changes with memory since hospitalization.  Provided education to patient and/or caregiver about advanced directives Provided education to patient re: calling the office for  changes in mood/anxiety/depression Reviewed medications with patient and discussed compliance, poly pharmacy, and duplications. 02-21-2021: Compliant with medications  Collaborated with RNCM, pcp, and CCM team regarding patients expressed needs  Discussed plans with patient for ongoing care management follow up and provided patient with direct contact information for care management team Reviewed scheduled/upcoming provider appointments including: 06-08-2021 at 11 am  Patient Goals/Self-Care Activities Over the next 120 days, patient will:  - Patient will self administer medications as prescribed Patient will attend all scheduled provider appointments Patient will call pharmacy for medication refills Patient is dependent on daughter and others to help meet her ADLS/IADLS Patient will call provider office for new concerns or questions Patient will work with BSW to address care coordination needs and will continue to work with the clinical team to address health care and disease management related needs.    - anxiety screen reviewed - depression screen reviewed - medication list reviewed - participation in psychiatric services encouraged - substance use assessed - substance use risk screen reviewed Follow Up Plan: Telephone follow up appointment with care management team member scheduled for: 05-09-2021 at 1145 am        Care Plan : RNCM: Fall Risk (Adult)  Updates made by Vanita Ingles since 02/21/2021 12:00 AM     Problem: RNCM: Fall Risk   Priority: High     Long-Range Goal: RNCM: Absence of Fall and Fall-Related Injury   Start Date: 01/17/2021  Expected End Date: 10/04/2021  This Visit's Progress: On track  Recent Progress: On track  Priority: High  Note:   Current Barriers:  Knowledge Deficits related to fall precautions in patient with new diagnosis of clonus hyperreflexia, blindness, past history of seizures, past history of strokes and multiple chronic conditions impacting  care.  Decreased adherence to prescribed treatment for fall prevention Unable to independently manage falls as evidence of several falls with hospitalization from 01-07-2021 to 01-12-2021 and currently at rehab facility getting OT/PT/Speech therapy Unable to self administer medications as prescribed Unable to perform ADLs independently Unable to perform IADLs independently Knowledge Deficits related to falls prevention and safety  Chronic Disease Management support and education needs related to new diagnosis of clonus hyperreactivity and other chronic conditions impacting patient care Clinical Goal(s):  patient will demonstrate improved adherence to prescribed treatment plan for decreasing falls  as evidenced by patient reporting and review of EMR patient will verbalize using fall risk reduction strategies discussed patient will not experience additional falls patient will verbalize understanding of plan for falls prevention and safety  patient will work with [facility] and pcp  to address needs related to remaining safe in her environment  patient will take all medications exactly as prescribed and will call provider for medication related questions patient will demonstrate improved health management independence the patient will demonstrate ongoing self health care management ability Interventions:  Collaboration with Valerie Roys, DO regarding development and update of comprehensive plan of care as evidenced by provider attestation and co-signature Inter-disciplinary care team collaboration (see longitudinal plan of care) Provided written and verbal education re: Potential causes of falls and Fall prevention strategies Reviewed medications and discussed potential side effects of medications such as dizziness and frequent urination Assessed for s/s of orthostatic hypotension Assessed for falls since last encounter. 02-21-2021: Denies any new falls at this time. Will continue to monitor.  Assessed  patients knowledge of fall risk prevention secondary to previously provided education. Assessed working status of life alert bracelet and patient adherence Provided patient information for fall alert systems Evaluation of current treatment plan related to falls prevention and safety and patient's adherence to plan as established by provider. 02-21-2021: The patients daughter is with her 24/7. Mindful of safety issues and concerns. Will call for new needs  Advised patient to call the office for changes in conditions, questions/Concerns, and new falls  Provided education to patient re: falls prevention and safety. Per the daughter the patient had multiple falls and EMS had to be called 2 times within 24 hours. The decision was made to go to the hospital to be evaluated. The patient was admitted and had multiple test. Patient hospitalized from 01-07-2021 to 01-12-2021 with discharge to SNF for rehab. Will continue to monitor for discharge from SNF and follow up appropriately. 02-21-2021: Is home and doing better, still having seizures but is remaining safe. Will continue to monitor. Discussed plans with patient for ongoing care management follow up and provided patient with direct contact information for care management team Self-Care Deficits:  Unable to independently manage fall prevention and safety  Unable to self administer medications as prescribed Unable to perform ADLs independently Unable to perform IADLs independently Patient Goals:  - Utilize walker (assistive device) appropriately with all ambulation - De-clutter walkways - Change positions slowly - Wear secure fitting shoes at all times with ambulation - Utilize home lighting for dim lit areas - Demonstrate self and pet awareness at all times Follow Up Plan: Telephone follow up appointment with care management team member scheduled for: 05-09-2021 at 1145 am     Plan:Telephone follow up appointment with care management team member  scheduled for:  05-09-2021 at 1145 am  Noreene Larsson RN, MSN, [facility] Mobile: [phone]

## 2021-02-21 NOTE — Patient Instructions (Signed)
Visit Information  PATIENT GOALS:  Goals Addressed             This Visit's Progress    RNCM: Prevent Falls and Injury       Follow Up Date 05-09-2021    - add more outdoor lighting - always use handrails on the stairs - always wear low-heeled or flat shoes or slippers with nonskid soles - call the doctor if I am feeling too drowsy - install bathroom grab bars - keep a flashlight by the bed - keep my cell phone with me always - learn how to get back up if I fall - make an emergency alert plan in case I fall - pick up clutter from the floors - use a nonslip pad with throw rugs, or remove them completely - use a cane or walker - use a nightlight in the bathroom - wear my glasses and/or hearing aid    Why is this important?   Most falls happen when it is hard for you to walk safely. Your balance may be off because of an illness. You may have pain in your knees, hip or other joints.  You may be overly tired or taking medicines that make you sleepy. You may not be able to see or hear clearly.  Falls can lead to broken bones, bruises or other injuries.  There are things you can do to help prevent falling.     Notes: 01-17-2021: The patient was recently diagnosed with clonus hyperreflexia and the patient was hospitalized from 01-07-2021 to 01-12-2021, the patient is currently in rehab and will be working with OT/PT/Speech therapy. The patients daughter states she is doing better since hospitalization and medication changes. They are weaning her off of medications that may cause issues and are not needed. Will continue to monitor. 02-21-2021: The patient is home and doing well. They are waiting on neurology to call with an appointment and have not been able to obtain an appointment. The patients daughter ask if a new neurologist referral for a new neurologist could be obtained. Will collaborate with the pcp for this request. Will continue to monitor for changes.         Patient verbalizes  understanding of instructions provided today and agrees to view in MyChart.   Telephone follow up appointment with care management team member scheduled for: 05-09-2021 at 1145 am   Alto Denver RN, MSN, CCM Community Care Coordinator [place]  [facility] Mobile: [phone]

## 2021-02-22 ENCOUNTER — Telehealth: Payer: Self-pay | Admitting: Family Medicine

## 2021-02-22 NOTE — Telephone Encounter (Signed)
OK for verbal orders?

## 2021-02-22 NOTE — Telephone Encounter (Signed)
Esther OT with [facility] is calling in to request VO for a pt.    Frequency: 1 week 1 and 2 week 2    [phone]- she has a secure vm.

## 2021-02-22 NOTE — Telephone Encounter (Signed)
Spoke with Tara [name] and provided her with OK verbal orders from Dr.Johnson on behalf of patient. Verbalized understanding and has no further questions.

## 2021-03-05 ENCOUNTER — Telehealth: Payer: Self-pay | Admitting: Family Medicine

## 2021-03-05 NOTE — Telephone Encounter (Signed)
Tara [name] with [facility] states daughter requested only one visit per week. Pt misssed this week and last week. So Darral Dash would like verbal OT orders doe  1 wk 2  Cb [phone] ok to leave message

## 2021-03-05 NOTE — Telephone Encounter (Signed)
OK for verbal orders?

## 2021-03-05 NOTE — Telephone Encounter (Signed)
Routing to provider for orders.  °

## 2021-03-05 NOTE — Telephone Encounter (Signed)
Spoke with Darral Dash at [facility] and was provided OK verbal orders. Esther verbalized understanding and has no further questions.

## 2021-03-16 NOTE — Addendum Note (Signed)
Addended by: Dorcas Carrow on: 03/16/2021 12:28 PM   Modules accepted: Level of Service

## 2021-05-05 ENCOUNTER — Other Ambulatory Visit: Payer: Self-pay | Admitting: Family Medicine

## 2021-05-05 ENCOUNTER — Other Ambulatory Visit: Payer: Self-pay | Admitting: Family

## 2021-05-05 DIAGNOSIS — I25118 Atherosclerotic heart disease of native coronary artery with other forms of angina pectoris: Secondary | ICD-10-CM

## 2021-05-05 NOTE — Telephone Encounter (Signed)
Requested medication (s) are due for refill today: -nortriptyline  Requested medication (s) are on the active medication list: historical med  Last refill:  01/07/21  Future visit scheduled: yes  Notes to clinic:  historical provider 150 mg Pregabalin dose decreased to 75 mg- NT not delegated to dc/refill or refuse this med  Requested Prescriptions  Pending Prescriptions Disp Refills   pregabalin (LYRICA) 150 MG capsule [Pharmacy Med Name: PREGABALIN 150 MG CAPSULE] 60 capsule     Sig: Take 1 capsule (150 mg total) by mouth 2 (two) times daily.     Not Delegated - Neurology:  Anticonvulsants - Controlled Failed - 05/05/2021  2:04 PM      Failed - This refill cannot be delegated      Passed - Valid encounter within last 12 months    Recent Outpatient Visits           2 months ago Seizure-like activity [facility])   [facility] [place], Megan P, DO   4 months ago Tobacco abuse   [facility] [place], [place], DO   7 months ago Acute recurrent frontal sinusitis   [facility], [place], DO   9 months ago Anxiety   [facility] [place], [place], DO   1 year ago Polyneuropathy, unspecified   [facility] Johnson, [place], DO       Future Appointments             In 1 month Johnson, Megan P, DO [facility], [facility]             nortriptyline (PAMELOR) 50 MG capsule [Pharmacy Med Name: NORTRIPTYLINE HCL 50 MG CAP] 90 capsule     Sig: Take 1 capsule (50 mg total) by mouth at bedtime.     Psychiatry:  Antidepressants - Heterocyclics (TCAs) Passed - 05/05/2021  2:04 PM      Passed - Valid encounter within last 6 months    Recent Outpatient Visits           2 months ago Seizure-like activity [facility])   [facility] [place], [place], DO   4 months ago Tobacco abuse   [facility] [place], [place], DO   7 months ago Acute recurrent frontal sinusitis   [facility]  [place], [place], DO   9 months ago Anxiety   [facility] [place], [place], DO   1 year ago Polyneuropathy, unspecified   [facility] [place], [place], DO       Future Appointments             In 1 month Johnson, Oralia Rud, DO [facility], [facility]

## 2021-05-09 ENCOUNTER — Telehealth: Payer: Self-pay

## 2021-05-09 ENCOUNTER — Telehealth: Payer: Federal, State, Local not specified - PPO

## 2021-05-09 NOTE — Telephone Encounter (Signed)
  Care Management   Follow Up Note   05/09/2021 Name: Tara [name] MRN: [id number] DOB: [birth year]   Referred by: Dorcas Carrow, DO Reason for referral : Chronic Care Management (RNCM: Follow up for Chronic Disease Management and Care Coordination Needs- daughter ask for a reschedule due to being in a meeting. )   The patients daughter was going into a meeting for her work and could not talk at this time. Ask for a reschedule. Will reschedule the patient per the daughter request.   Follow Up Plan: The care management team will reach out to the patient again over the next 5 to 14 days.   Alto Denver RN, MSN, CCM Community Care Coordinator [place]  [facility] Mobile: [phone]

## 2021-05-14 NOTE — Telephone Encounter (Signed)
Never mind. She has been taking. Refills given.

## 2021-05-14 NOTE — Telephone Encounter (Signed)
Please confirm she has been taking both of these. It looks like her lyrica was discontinued in June

## 2021-05-22 ENCOUNTER — Telehealth: Payer: Self-pay

## 2021-05-22 ENCOUNTER — Telehealth: Payer: Federal, State, Local not specified - PPO

## 2021-05-22 NOTE — Telephone Encounter (Signed)
  Care Management   Follow Up Note   05/22/2021 Name: Tara [name] MRN: [id number] DOB: 09/06/1945   Referred by: Dorcas Carrow, DO Reason for referral : Chronic Care Management (Follow up for Chronic Disease Management and Care Coordination Needs )   A second unsuccessful telephone outreach was attempted today. The patient was referred to the case management team for assistance with care management and care coordination.   Follow Up Plan: A HIPPA compliant phone message was left for the patient providing contact information and requesting a return call.   Alto Denver RN, MSN, CCM Community Care Coordinator [place]  [facility] Mobile: [phone]

## 2021-06-08 ENCOUNTER — Encounter: Payer: Federal, State, Local not specified - PPO | Admitting: Family Medicine

## 2021-06-12 ENCOUNTER — Other Ambulatory Visit (INDEPENDENT_AMBULATORY_CARE_PROVIDER_SITE_OTHER): Payer: Self-pay | Admitting: Nurse Practitioner

## 2021-06-12 DIAGNOSIS — I739 Peripheral vascular disease, unspecified: Secondary | ICD-10-CM

## 2021-06-13 ENCOUNTER — Encounter (INDEPENDENT_AMBULATORY_CARE_PROVIDER_SITE_OTHER): Payer: Medicare Other

## 2021-06-13 ENCOUNTER — Ambulatory Visit (INDEPENDENT_AMBULATORY_CARE_PROVIDER_SITE_OTHER): Payer: Medicare Other | Admitting: Nurse Practitioner

## 2021-06-18 ENCOUNTER — Other Ambulatory Visit: Payer: Self-pay | Admitting: Family Medicine

## 2021-06-19 NOTE — Telephone Encounter (Signed)
Requested medication (s) are due for refill today: yes  Requested medication (s) are on the active medication list: yes  Last refill:  05/14/21 #60  Future visit scheduled: yes  Notes to clinic:  Please review for refill. Refills not delegated per protocol.     Requested Prescriptions  Pending Prescriptions Disp Refills   pregabalin (LYRICA) 150 MG capsule [Pharmacy Med Name: PREGABALIN 150 MG CAPSULE] 60 capsule 0    Sig: Take 1 capsule (150 mg total) by mouth 2 (two) times daily.     Not Delegated - Neurology:  Anticonvulsants - Controlled Failed - 06/18/2021 12:11 PM      Failed - This refill cannot be delegated      Passed - Valid encounter within last 12 months    Recent Outpatient Visits           4 months ago Seizure-like activity [facility])   [facility] [place], Megan P, DO   6 months ago Tobacco abuse   [facility] [place], [place], DO   9 months ago Acute recurrent frontal sinusitis   [facility] [place], [place], DO   10 months ago Anxiety   [facility] [place], [place], DO   1 year ago Polyneuropathy, unspecified   [facility] [place], [place], DO       Future Appointments             In 1 month Johnson, Oralia Rud, DO [facility], [facility]

## 2021-06-25 ENCOUNTER — Telehealth: Payer: Self-pay | Admitting: Family Medicine

## 2021-06-25 NOTE — Telephone Encounter (Signed)
Copied from CRM [id number]. Topic: General - Other >> Jun 25, 2021  2:00 PM Gaetana Michaelis A wrote: Reason for CRM: Tara [name] with [facility] healthcare has called to share that the patient will resume their physical therapy services starting today 06/25/21  The patient suspended services temporarily last week because they were not feeling well  Please contact further if needed

## 2021-07-02 ENCOUNTER — Other Ambulatory Visit: Payer: Self-pay | Admitting: Family Medicine

## 2021-07-02 NOTE — Telephone Encounter (Signed)
Pt has started using a new pharmacy and she contacted [place] to have medications transferred / all meds were transferred except for pregabalin (LYRICA) 150 MG capsule / [facility] could not transfer this Rx and needs a new RX from provider / please advise  Pt wanted to know if this could be done today because she is out of medication and has gone a few days without it already.

## 2021-07-02 NOTE — Telephone Encounter (Signed)
Requested medications are due for refill today.  See below  Requested medications are on the active medications list.  yes  Last refill. 06/20/2021  Future visit scheduled.   yes  Notes to clinic.  See note from pt. Pt has moved to a new pharmacy and old pharmacy is unable to transfer this medication to the new pharmacy.    Requested Prescriptions  Pending Prescriptions Disp Refills   pregabalin (LYRICA) 150 MG capsule 60 capsule 1    Sig: Take 1 capsule (150 mg total) by mouth 2 (two) times daily.     Not Delegated - Neurology:  Anticonvulsants - Controlled Failed - 07/02/2021  2:15 PM      Failed - This refill cannot be delegated      Passed - Valid encounter within last 12 months    Recent Outpatient Visits           4 months ago Seizure-like activity [facility])   [facility] [place], Megan P, DO   6 months ago Tobacco abuse   [facility] [place], [place], DO   9 months ago Acute recurrent frontal sinusitis   [facility] [place], [place], DO   11 months ago Anxiety   [facility] [place], [place], DO   1 year ago Polyneuropathy, unspecified   [facility] [place], [place], DO       Future Appointments             In 1 month Johnson, Oralia Rud, DO [facility], [facility]

## 2021-07-03 ENCOUNTER — Encounter (INDEPENDENT_AMBULATORY_CARE_PROVIDER_SITE_OTHER): Payer: Medicare Other

## 2021-07-03 ENCOUNTER — Ambulatory Visit (INDEPENDENT_AMBULATORY_CARE_PROVIDER_SITE_OTHER): Payer: Medicare Other | Admitting: Nurse Practitioner

## 2021-07-03 NOTE — Telephone Encounter (Signed)
Pt's daughter states that Natoria is no longer living in [place], they are in [place]. Script was sent to [facility] and it was never picked up because they moved. Pt had all other meds sent to [facility] in [place]. [facility] stated that the prescription for this drug cannot be sent over because it is a controlled substance. Asking for another prescription to be written and [facility] [place]. Pt has not been on medication for a few days, complains of pain.

## 2021-07-06 MED ORDER — PREGABALIN 150 MG PO CAPS: 150.0000 mg | ORAL_CAPSULE | Freq: Two times a day (BID) | ORAL | 1 refills | Status: DC

## 2021-07-06 NOTE — Telephone Encounter (Signed)
Rx has been sent. I wish her all the best, but if she has moved that far away, it is probably a good idea for her to establish with a PCP out there.

## 2021-07-11 ENCOUNTER — Telehealth: Payer: Self-pay

## 2021-07-11 ENCOUNTER — Telehealth: Payer: Federal, State, Local not specified - PPO

## 2021-07-11 NOTE — Telephone Encounter (Signed)
°  Care Management   Follow Up Note   07/11/2021 Name: Symantha Steeber Appelhans MRN: [id number] DOB: 1945-12-01   Referred by: Dorcas Carrow, DO Reason for referral : Chronic Care Management (RNCM: Follow up for Chronic Disease Management and Care Coordination Needs )   An unsuccessful telephone outreach was attempted today. The patient was referred to the case management team for assistance with care management and care coordination.   Follow Up Plan: A HIPPA compliant phone message was left for the patient providing contact information and requesting a return call.   Alto Denver RN, MSN, CCM Community Care Coordinator [place]   [facility] Mobile: [phone]

## 2021-08-03 ENCOUNTER — Encounter: Payer: Federal, State, Local not specified - PPO | Admitting: Family Medicine

## 2021-08-06 ENCOUNTER — Other Ambulatory Visit: Payer: Self-pay | Admitting: Family Medicine

## 2021-08-06 MED ORDER — PREGABALIN 150 MG PO CAPS: 150.0000 mg | ORAL_CAPSULE | Freq: Two times a day (BID) | ORAL | 0 refills | Status: DC

## 2021-08-06 NOTE — Telephone Encounter (Signed)
Medication Refill - Medication: pregabalin (LYRICA) 150 MG capsule  Namenda and Baclofen  Has the patient contacted their pharmacy? YES  (Agent: If no, request that the patient contact the pharmacy for the refill. If patient does not wish to contact the pharmacy document the reason why and proceed with request.) (Agent: If yes, when and what did the pharmacy advise?) pt has moved and changed pharmacies and needs new Rx sent   Preferred Pharmacy (with phone number or street name):  [id number] - [place], [place] - [address] AT [facility] [address] Phone:  [phone]  Fax:  [phone]     Has the patient been seen for an appointment in the last year OR does the patient have an upcoming appointment? Yes.    Agent: Please be advised that RX refills may take up to 3 business days. We ask that you follow-up with your pharmacy.

## 2021-08-06 NOTE — Telephone Encounter (Signed)
Requested medication (s) are due for refill today: yes  Requested medication (s) are on the active medication list: yes  Last refill:  07/06/21 #60 1 refill  Future visit scheduled: yes 1 week  Notes to clinic:  not delegated per protocol. Do you want to refill Rx?     Requested Prescriptions  Pending Prescriptions Disp Refills   pregabalin (LYRICA) 150 MG capsule 60 capsule 1    Sig: Take 1 capsule (150 mg total) by mouth 2 (two) times daily.     Not Delegated - Neurology:  Anticonvulsants - Controlled Failed - 08/06/2021  2:59 PM      Failed - This refill cannot be delegated      Passed - Valid encounter within last 12 months    Recent Outpatient Visits           5 months ago Seizure-like activity [facility])   [facility] [place], [place], DO   7 months ago Tobacco abuse   [facility] [place], [place], DO   10 months ago Acute recurrent frontal sinusitis   [facility] [place], [place], DO   1 year ago Anxiety   [facility] [place], [place], DO   1 year ago Polyneuropathy, unspecified   [facility] [place], [place], DO       Future Appointments             In 1 week Laural Benes, Oralia Rud, DO [facility], [facility]

## 2021-08-14 ENCOUNTER — Other Ambulatory Visit: Payer: Self-pay

## 2021-08-14 ENCOUNTER — Encounter: Payer: Self-pay | Admitting: Family Medicine

## 2021-08-14 ENCOUNTER — Other Ambulatory Visit: Payer: Self-pay | Admitting: Family Medicine

## 2021-08-14 ENCOUNTER — Ambulatory Visit (INDEPENDENT_AMBULATORY_CARE_PROVIDER_SITE_OTHER): Payer: Federal, State, Local not specified - PPO | Admitting: Family Medicine

## 2021-08-14 VITALS — BP 128/68 | HR 69 | Temp 98.1°F | Wt 223.4 lb

## 2021-08-14 DIAGNOSIS — I25118 Atherosclerotic heart disease of native coronary artery with other forms of angina pectoris: Secondary | ICD-10-CM

## 2021-08-14 DIAGNOSIS — E782 Mixed hyperlipidemia: Secondary | ICD-10-CM

## 2021-08-14 DIAGNOSIS — E538 Deficiency of other specified B group vitamins: Secondary | ICD-10-CM | POA: Diagnosis not present

## 2021-08-14 DIAGNOSIS — R569 Unspecified convulsions: Secondary | ICD-10-CM | POA: Diagnosis not present

## 2021-08-14 DIAGNOSIS — I10 Essential (primary) hypertension: Secondary | ICD-10-CM

## 2021-08-14 DIAGNOSIS — S41101A Unspecified open wound of right upper arm, initial encounter: Secondary | ICD-10-CM

## 2021-08-14 DIAGNOSIS — R200 Anesthesia of skin: Secondary | ICD-10-CM

## 2021-08-14 DIAGNOSIS — Z0000 Encounter for general adult medical examination without abnormal findings: Secondary | ICD-10-CM | POA: Diagnosis not present

## 2021-08-14 DIAGNOSIS — Z23 Encounter for immunization: Secondary | ICD-10-CM

## 2021-08-14 DIAGNOSIS — R739 Hyperglycemia, unspecified: Secondary | ICD-10-CM

## 2021-08-14 DIAGNOSIS — Z13820 Encounter for screening for osteoporosis: Secondary | ICD-10-CM

## 2021-08-14 DIAGNOSIS — J0100 Acute maxillary sinusitis, unspecified: Secondary | ICD-10-CM

## 2021-08-14 DIAGNOSIS — Z1231 Encounter for screening mammogram for malignant neoplasm of breast: Secondary | ICD-10-CM

## 2021-08-14 LAB — URINALYSIS, ROUTINE W REFLEX MICROSCOPIC
Bilirubin, UA: NEGATIVE
Ketones, UA: NEGATIVE
Leukocytes,UA: NEGATIVE
Nitrite, UA: NEGATIVE
RBC, UA: NEGATIVE
Specific Gravity, UA: >1.03 — ABNORMAL HIGH (ref 1.005–1.030)
Urobilinogen, Ur: 0.2 mg/dL (ref 0.2–1.0)
pH, UA: 5.5 (ref 5.0–7.5)

## 2021-08-14 LAB — MICROSCOPIC EXAMINATION: RBC, Urine: NONE SEEN /hpf (ref 0–2)

## 2021-08-14 LAB — MICROALBUMIN, URINE WAIVED
Creatinine, Urine Waived: 200 mg/dL (ref 10–300)
Microalb, Ur Waived: 150 mg/L — ABNORMAL HIGH (ref 0–19)

## 2021-08-14 LAB — BAYER DCA HB A1C WAIVED: HB A1C (BAYER DCA - WAIVED): 7.1 % — ABNORMAL HIGH (ref 4.8–5.6)

## 2021-08-14 MED ORDER — AMOXICILLIN-POT CLAVULANATE 875-125 MG PO TABS: 1.0000 | ORAL_TABLET | Freq: Two times a day (BID) | ORAL | 0 refills | Status: DC

## 2021-08-14 MED ORDER — DIPHENOXYLATE-ATROPINE 2.5-0.025 MG PO TABS: 1.0000 | ORAL_TABLET | Freq: Four times a day (QID) | ORAL | 0 refills | Status: AC | PRN

## 2021-08-14 MED ORDER — NYSTATIN 100000 UNIT/ML MT SUSP: 5.0000 mL | Freq: Three times a day (TID) | OROMUCOSAL | 1 refills | Status: DC | PRN

## 2021-08-14 MED ORDER — CYANOCOBALAMIN 1000 MCG/ML IJ SOLN
1000.0000 ug | Freq: Once | INTRAMUSCULAR | Status: AC
  Administered 2021-08-14: 1000 ug via INTRAMUSCULAR

## 2021-08-14 MED ORDER — ALBUTEROL SULFATE HFA 108 (90 BASE) MCG/ACT IN AERS: 2.0000 | INHALATION_SPRAY | Freq: Four times a day (QID) | RESPIRATORY_TRACT | 0 refills | Status: DC | PRN

## 2021-08-14 NOTE — Patient Instructions (Signed)
Please call to schedule your mammogram and/or bone density: °[facility]  °Address: [address], [place], [place] [address]  °Phone: [phone] ° °

## 2021-08-14 NOTE — Progress Notes (Signed)
BP 128/68    Pulse 69    Temp 98.1 F (36.7 C)    Wt 223 lb 6.4 oz (101.3 kg)    SpO2 94%    BMI 38.35 kg/m    Subjective:    Patient ID: Tara [name], female    DOB: 1946/01/09, 76 y.o.   MRN: [id number]  HPI: Tara [name] is a 76 y.o. female presenting on 08/14/2021 for comprehensive medical examination. Current medical complaints include:  UPPER RESPIRATORY TRACT INFECTION Duration: 3 days Worst symptom: sinus drainage Fever: no Cough: yes Shortness of breath: no Wheezing: yes Chest pain: no Chest tightness: no Chest congestion: no Nasal congestion: no Runny nose: yes Post nasal drip: yes Sneezing: no Sore throat: yes Swollen glands: no Sinus pressure: yes Headache: no Face pain: no Toothache: no Ear pain: yes bilateral Ear pressure: no  Eyes red/itching:no Eye drainage/crusting: no  Vomiting: no Rash: no Fatigue: yes Sick contacts: no Strep contacts: no  Context: worse Recurrent sinusitis: no Relief with OTC cold/cough medications: no  Treatments attempted: flonase  HYPERTENSION / HYPERLIPIDEMIA Satisfied with current treatment? yes Duration of hypertension: chronic BP monitoring frequency: not checking BP medication side effects: yes- hair loss on the lisinopril Past BP meds: lisinopril, amlodipine, imdur Duration of hyperlipidemia: chronic Cholesterol medication side effects: no Cholesterol supplements: none Past cholesterol medications: crestor Medication compliance: excellent compliance Aspirin: no Recent stressors: no Recurrent headaches: no Visual changes: no Palpitations: no Dyspnea: no Chest pain: no Lower extremity edema: no Dizzy/lightheaded: no  No seizures since last visit.   CHRONIC PAIN  Pain control status: stable Duration: chronic Location: R leg Quality: burning, shooting Current Pain Level: moderate Previous Pain Level: moderate Breakthrough pain: yes Benefit from narcotic medications: N/A  She  currently lives with: daughter  Menopausal Symptoms: no  Depression Screen done today and results listed below:  Depression screen [facility] 2/9 08/14/2021 12/11/2020 08/08/2020 08/04/2020 04/22/2019  Decreased Interest 3 0 0 0 2  Down, Depressed, Hopeless '3 1 3 ' 0 2  PHQ - 2 Score '6 1 3 ' 0 4  Altered sleeping 2 2 0 0 3  Tired, decreased energy '3 2 3 3 3  ' Change in appetite 2 1 0 0 2  Feeling bad or failure about yourself  1 3 0 0 2  Trouble concentrating '3 3 3 1 2  ' Moving slowly or fidgety/restless '3 3 3 ' 0 0  Suicidal thoughts 0 1 0 0 0  PHQ-9 Score '20 16 12 4 16  ' Difficult doing work/chores - Not difficult at all Very difficult Somewhat difficult Extremely dIfficult     Past Medical History:  Past Medical History:  Diagnosis Date   Anxiety    Arthritis    Barrett's syndrome    Carpal tunnel syndrome    Depression    GERD (gastroesophageal reflux disease)    Heart murmur    Hyperlipidemia    Hypertension    Myocardial infarction ([place])    08   PAD (peripheral artery disease) ([place])    Stroke [facility])     Surgical History:  Past Surgical History:  Procedure Laterality Date   ABDOMINAL AORTOGRAM W/LOWER EXTREMITY N/A 07/28/2019   Procedure: ABDOMINAL AORTOGRAM W/LOWER EXTREMITY;  Surgeon: Wellington Hampshire, MD;  Location: [facility];  Service: Cardiovascular;  Laterality: N/A;   ARTERY BIOPSY Right 07/12/2019   Procedure: BIOPSY TEMPORAL ARTERY;  Surgeon: Olean Ree, MD;  Location: [facility];  Service: General;  Laterality: Right;   BREAST  BIOPSY Left 2007   [place] VALVE REPLACEMENT     CHOLECYSTECTOMY     COLON SURGERY     COLONOSCOPY WITH PROPOFOL N/A 04/09/2019   Procedure: COLONOSCOPY WITH PROPOFOL;  Surgeon: Virgel Manifold, MD;  Location: [facility];  Service: Endoscopy;  Laterality: N/A;   CORONARY ANGIOPLASTY WITH STENT PLACEMENT  2008   [place] Stent placement x 1    DENTAL SURGERY      ESOPHAGOGASTRODUODENOSCOPY (EGD) WITH PROPOFOL N/A 04/09/2019   Procedure: ESOPHAGOGASTRODUODENOSCOPY (EGD) WITH PROPOFOL;  Surgeon: Virgel Manifold, MD;  Location: [facility];  Service: Endoscopy;  Laterality: N/A;   EXCISIONAL HEMORRHOIDECTOMY     EYE SURGERY     FEMORAL-POPLITEAL BYPASS GRAFT Right 08/02/2019   Procedure: BYPASS GRAFT FEMORAL-POPLITEAL ARTERY;  Surgeon: Rosetta Posner, MD;  Location: [facility];  Service: Vascular;  Laterality: Right;   HERNIA REPAIR     LOWER EXTREMITY ANGIOGRAPHY Left 11/11/2019   Procedure: LOWER EXTREMITY ANGIOGRAPHY;  Surgeon: Algernon Huxley, MD;  Location: [facility];  Service: Cardiovascular;  Laterality: Left;   MULTIPLE TOOTH EXTRACTIONS     TONSILLECTOMY     TOTAL ABDOMINAL HYSTERECTOMY      Medications:  Current Outpatient Medications on File Prior to Visit  Medication Sig   aspirin EC 81 MG tablet Take 81 mg by mouth daily. Swallow whole.   B-D TB SYRINGE 1CC/27GX1/2" 27G X 1/2" 1 ML MISC USE AS DIRECTED EVERY 30 DAYS   baclofen (LIORESAL) 10 MG tablet Take by mouth.   clopidogrel (PLAVIX) 75 MG tablet Take 1 tablet (75 mg total) by mouth daily.   cyanocobalamin (,VITAMIN B-12,) 1000 MCG/ML injection Inject 1 mL (1,000 mcg total) into the muscle every 30 (thirty) days.   diclofenac Sodium (VOLTAREN) 1 % GEL Apply 4 g topically 4 (four) times daily.   donepezil (ARICEPT) 5 MG tablet Take 5 mg by mouth daily.   fluticasone (FLONASE) 50 MCG/ACT nasal spray Place 1-2 sprays into both nostrils daily.   Insulin Syringe-Needle U-100 (INSULIN SYRINGE 1CC/30GX5/16") 30G X 5/16" 1 ML MISC 1 each by Does not apply route every 30 (thirty) days.   lidocaine (LIDODERM) 5 % Place 2 patches onto the skin daily.   loperamide (IMODIUM) 2 MG capsule Take 2 mg by mouth as needed for diarrhea or loose stools.   montelukast (SINGULAIR) 10 MG tablet Take 10 mg by mouth daily.   naproxen (NAPROSYN) 375 MG tablet Take 1 tablet (375 mg total) by mouth 2  (two) times daily as needed for mild pain or moderate pain.   neomycin-bacitracin-polymyxin (NEOSPORIN) OINT Apply 1 application topically as needed for wound care.   nitroGLYCERIN (NITROSTAT) 0.4 MG SL tablet Place 1 tablet (0.4 mg total) under the tongue every 5 (five) minutes as needed for chest pain.   rosuvastatin (CRESTOR) 40 MG tablet Take 1 tablet (40 mg total) by mouth daily.   No current facility-administered medications on file prior to visit.    Allergies:  No Known Allergies  Social History:  Social History   Socioeconomic History   Marital status: Married    Spouse name: Not on file   Number of children: Not on file   Years of education: Not on file   Highest education level: Not on file  Occupational History   Not on file  Tobacco Use   Smoking status: Light Smoker    Packs/day: 0.25  Years: 3.00    Pack years: 0.75    Types: Cigarettes   Smokeless tobacco: Never   Tobacco comments:    " 3 cigarettes per day "-1 pack per week  Vaping Use   Vaping Use: Never used  Substance and Sexual Activity   Alcohol use: Never   Drug use: Never   Sexual activity: Yes  Other Topics Concern   Not on file  Social History Narrative   Not on file   Social Determinants of Health   Financial Resource Strain: Not on file  Food Insecurity: Not on file  Transportation Needs: Not on file  Physical Activity: Not on file  Stress: Not on file  Social Connections: Not on file  Intimate Partner Violence: Not on file   Social History   Tobacco Use  Smoking Status Light Smoker   Packs/day: 0.25   Years: 3.00   Pack years: 0.75   Types: Cigarettes  Smokeless Tobacco Never  Tobacco Comments   " 3 cigarettes per day "-1 pack per week   Social History   Substance and Sexual Activity  Alcohol Use Never    Family History:  Family History  Adopted: Yes  Problem Relation Age of Onset   Breast cancer Mother 82    Past medical history, surgical history,  medications, allergies, family history and social history reviewed with patient today and changes made to appropriate areas of the chart.   Review of Systems  Constitutional: Negative.   HENT:  Positive for ear pain. Negative for congestion, ear discharge, hearing loss, nosebleeds, sinus pain, sore throat and tinnitus.   Eyes:  Positive for blurred vision and double vision. Negative for photophobia, pain, discharge and redness.  Respiratory:  Positive for cough and wheezing. Negative for hemoptysis, sputum production, shortness of breath and stridor.   Cardiovascular: Negative.   Gastrointestinal:  Positive for abdominal pain, diarrhea, heartburn (food choices), nausea and vomiting (rarely). Negative for blood in stool, constipation and melena.  Genitourinary: Negative.        Strong smelling urine  Musculoskeletal:  Positive for myalgias. Negative for back pain, falls, joint pain and neck pain.  Skin: Negative.   Neurological:  Positive for tingling. Negative for dizziness, tremors, sensory change, speech change, focal weakness, seizures, loss of consciousness, weakness and headaches.  Endo/Heme/Allergies:  Positive for environmental allergies and polydipsia. Bruises/bleeds easily.  Psychiatric/Behavioral: Negative.    All other ROS negative except what is listed above and in the HPI.      Objective:    BP 128/68    Pulse 69    Temp 98.1 F (36.7 C)    Wt 223 lb 6.4 oz (101.3 kg)    SpO2 94%    BMI 38.35 kg/m   Wt Readings from Last 3 Encounters:  08/14/21 223 lb 6.4 oz (101.3 kg)  02/09/21 219 lb (99.3 kg)  01/12/21 194 lb 3.6 oz (88.1 kg)    Physical Exam Vitals and nursing note reviewed.  Constitutional:      General: She is not in acute distress.    Appearance: Normal appearance. She is not ill-appearing, toxic-appearing or diaphoretic.  HENT:     Head: Normocephalic and atraumatic.     Right Ear: Tympanic membrane, ear canal and external ear normal. There is no impacted  cerumen.     Left Ear: Tympanic membrane, ear canal and external ear normal. There is no impacted cerumen.     Nose: Nose normal. No congestion or rhinorrhea.  Mouth/Throat:     Mouth: Mucous membranes are moist.     Pharynx: Oropharynx is clear. No oropharyngeal exudate or posterior oropharyngeal erythema.  Eyes:     General: No scleral icterus.       Right eye: No discharge.        Left eye: No discharge.     Extraocular Movements: Extraocular movements intact.     Conjunctiva/sclera: Conjunctivae normal.     Pupils: Pupils are equal, round, and reactive to light.  Neck:     Vascular: No carotid bruit.  Cardiovascular:     Rate and Rhythm: Normal rate and regular rhythm.     Pulses: Normal pulses.     Heart sounds: Normal heart sounds. No murmur heard.   No friction rub. No gallop.  Pulmonary:     Effort: Pulmonary effort is normal. No respiratory distress.     Breath sounds: No stridor. Wheezing and rhonchi present. No rales.  Chest:     Chest wall: No tenderness.  Abdominal:     General: Abdomen is flat. Bowel sounds are normal. There is no distension.     Palpations: Abdomen is soft. There is no mass.     Tenderness: There is no abdominal tenderness. There is no right CVA tenderness, left CVA tenderness, guarding or rebound.     Hernia: No hernia is present.  Genitourinary:    Comments: Breast and pelvic exams deferred with shared decision making Musculoskeletal:        General: No swelling, tenderness, deformity or signs of injury. Normal range of motion.     Cervical back: Normal range of motion and neck supple. No rigidity. No muscular tenderness.     Right lower leg: No edema.     Left lower leg: No edema.  Lymphadenopathy:     Cervical: No cervical adenopathy.  Skin:    General: Skin is warm and dry.     Capillary Refill: Capillary refill takes less than 2 seconds.     Coloration: Skin is not jaundiced or pale.     Findings: No bruising, erythema, lesion or  rash.  Neurological:     General: No focal deficit present.     Mental Status: She is alert and oriented to person, place, and time. Mental status is at baseline.     Cranial Nerves: No cranial nerve deficit.     Sensory: No sensory deficit.     Motor: No weakness.     Coordination: Coordination normal.     Gait: Gait normal.     Deep Tendon Reflexes: Reflexes normal.  Psychiatric:        Mood and Affect: Mood normal.        Behavior: Behavior normal.        Thought Content: Thought content normal.        Judgment: Judgment normal.    Results for orders placed or performed in visit on 08/14/21  Microscopic Examination   BLD  Result Value Ref Range   WBC, UA 0-5 0 - 5 /hpf   RBC None seen 0 - 2 /hpf   Epithelial Cells (non renal) 0-10 0 - 10 /hpf   Mucus, UA Present (A) Not Estab.   Bacteria, UA Moderate (A) None seen/Few  Comprehensive metabolic panel  Result Value Ref Range   Glucose 128 (H) 70 - 99 mg/dL   BUN 19 8 - 27 mg/dL   Creatinine, Ser 1.07 (H) 0.57 - 1.00 mg/dL   eGFR 55 (L) >59  mL/min/1.73   BUN/Creatinine Ratio 18 12 - 28   Sodium 143 134 - 144 mmol/L   Potassium 4.4 3.5 - 5.2 mmol/L   Chloride 106 96 - 106 mmol/L   CO2 23 20 - 29 mmol/L   Calcium 9.3 8.7 - 10.3 mg/dL   Total Protein 6.6 6.0 - 8.5 g/dL   Albumin 4.3 3.7 - 4.7 g/dL   Globulin, Total 2.3 1.5 - 4.5 g/dL   Albumin/Globulin Ratio 1.9 1.2 - 2.2   Bilirubin Total 0.2 0.0 - 1.2 mg/dL   Alkaline Phosphatase 63 44 - 121 IU/L   AST 25 0 - 40 IU/L   ALT 19 0 - 32 IU/L  Lipid Panel w/o Chol/HDL Ratio  Result Value Ref Range   Cholesterol, Total 99 (L) 100 - 199 mg/dL   Triglycerides 123 0 - 149 mg/dL   HDL 41 >39 mg/dL   VLDL Cholesterol Cal 22 5 - 40 mg/dL   LDL Chol Calc (NIH) 36 0 - 99 mg/dL  Urinalysis, Routine w reflex microscopic  Result Value Ref Range   Specific Gravity, UA >1.030 (H) 1.005 - 1.030   pH, UA 5.5 5.0 - 7.5   Color, UA Yellow Yellow   Appearance Ur Clear Clear    Leukocytes,UA Negative Negative   Protein,UA 1+ (A) Negative/Trace   Glucose, UA Trace (A) Negative   Ketones, UA Negative Negative   RBC, UA Negative Negative   Bilirubin, UA Negative Negative   Urobilinogen, Ur 0.2 0.2 - 1.0 mg/dL   Nitrite, UA Negative Negative   Microscopic Examination See below:   TSH  Result Value Ref Range   TSH 2.550 0.450 - 4.500 uIU/mL  Microalbumin, Urine Waived  Result Value Ref Range   Microalb, Ur Waived 150 (H) 0 - 19 mg/L   Creatinine, Urine Waived 200 10 - 300 mg/dL   Microalb/Creat Ratio 30-300 (H) <30 mg/g  B12  Result Value Ref Range   Vitamin B-12 >2000 (H) 232 - 1245 pg/mL  Bayer DCA Hb A1c Waived  Result Value Ref Range   HB A1C (BAYER DCA - WAIVED) 7.1 (H) 4.8 - 5.6 %  CBC with Differential/Platelet  Result Value Ref Range   WBC 4.5 3.4 - 10.8 x10E3/uL   RBC 4.58 3.77 - 5.28 x10E6/uL   Hemoglobin 12.4 11.1 - 15.9 g/dL   Hematocrit 39.3 34.0 - 46.6 %   MCV 86 79 - 97 fL   MCH 27.1 26.6 - 33.0 pg   MCHC 31.6 31.5 - 35.7 g/dL   RDW 14.2 11.7 - 15.4 %   Platelets 169 150 - 450 x10E3/uL   Neutrophils 71 Not Estab. %   Lymphs 19 Not Estab. %   Monocytes 8 Not Estab. %   Eos 2 Not Estab. %   Basos 0 Not Estab. %   Neutrophils Absolute 3.2 1.4 - 7.0 x10E3/uL   Lymphocytes Absolute 0.8 0.7 - 3.1 x10E3/uL   Monocytes Absolute 0.3 0.1 - 0.9 x10E3/uL   EOS (ABSOLUTE) 0.1 0.0 - 0.4 x10E3/uL   Basophils Absolute 0.0 0.0 - 0.2 x10E3/uL   Immature Granulocytes 0 Not Estab. %   Immature Grans (Abs) 0.0 0.0 - 0.1 x10E3/uL  Specimen status report  Result Value Ref Range   specimen status report Comment       Assessment & Plan:   Problem List Items Addressed This Visit       Cardiovascular and Mediastinum   HTN (hypertension)    Does not want to take  lisinopril. Will stop and see how BP does. Will likely need additional medicine- Check 1 month.       Relevant Medications   isosorbide mononitrate (IMDUR) 60 MG 24 hr tablet    fenofibrate micronized (LOFIBRA) 200 MG capsule   amLODipine (NORVASC) 10 MG tablet   Other Relevant Orders   CBC with Differential/Platelet   Comprehensive metabolic panel (Completed)   Urinalysis, Routine w reflex microscopic (Completed)   TSH (Completed)   Microalbumin, Urine Waived (Completed)   Coronary artery disease of native artery of native heart with stable angina pectoris ([place])    Will keep BP, cholesterol and sugars under good control. Await results. Treat as needed.       Relevant Medications   baclofen (LIORESAL) 10 MG tablet   isosorbide mononitrate (IMDUR) 60 MG 24 hr tablet   traZODone (DESYREL) 100 MG tablet   traZODone (DESYREL) 50 MG tablet   pregabalin (LYRICA) 150 MG capsule   nortriptyline (PAMELOR) 50 MG capsule   fenofibrate micronized (LOFIBRA) 200 MG capsule   citalopram (CELEXA) 40 MG tablet   amLODipine (NORVASC) 10 MG tablet     Other   Hyperlipidemia    Under good control on current regimen. Continue current regimen. Continue to monitor. Call with any concerns. Refills given. Labs drawn today.        Relevant Medications   isosorbide mononitrate (IMDUR) 60 MG 24 hr tablet   fenofibrate micronized (LOFIBRA) 200 MG capsule   amLODipine (NORVASC) 10 MG tablet   Other Relevant Orders   CBC with Differential/Platelet   Comprehensive metabolic panel (Completed)   Lipid Panel w/o Chol/HDL Ratio (Completed)   B12 deficiency    Under good control on current regimen. Continue current regimen. Continue to monitor. Call with any concerns. Refills given. Labs drawn today.        Relevant Orders   CBC with Differential/Platelet   Comprehensive metabolic panel (Completed)   B12 (Completed)   Right leg numbness    Tolerating lyrica well. Refills given today. Call with any concerns.       Seizure-like activity ([place])    Doing well. Continue to follow with neurology. Call with any concerns.       Relevant Orders   CBC with Differential/Platelet    Comprehensive metabolic panel (Completed)   Other Visit Diagnoses     Routine general medical examination at a health care facility    -  Primary   Vaccines up to date. Screening labs checked today. Mammo and DEXA ordered. Continue diet and exercise. Call with any concerns.    Hyperglycemia       Checking labs today. await results.    Relevant Orders   Bayer DCA Hb A1c Waived (Completed)   Open wound of right upper arm, initial encounter       Healing well. Due for Td. Given today.    Relevant Orders   Td : Tetanus/diphtheria >7yo Preservative  free (Completed)   Encounter for screening mammogram for malignant neoplasm of breast       Relevant Orders   MM 3D SCREEN BREAST BILATERAL   Screening for osteoporosis       Relevant Orders   DG Bone Density        Follow up plan: Return in about 4 weeks (around 09/11/2021) for virtual OK for follow up BP.   LABORATORY TESTING:  - Pap smear: not applicable  IMMUNIZATIONS:   - Tdap: Tetanus vaccination status reviewed: Td vaccination indicated and given  today. - Influenza: Refused - Pneumovax: Up to date - Prevnar: Up to date - COVID: Refused - HPV: Not applicable - Shingrix vaccine: Refused  SCREENING: -Mammogram: Ordered today  - Colonoscopy: Up to date  - Bone Density: Ordered today    NEXT PREVENTATIVE PHYSICAL DUE IN 1 YEAR. Return in about 4 weeks (around 09/11/2021) for virtual OK for follow up BP.

## 2021-08-15 ENCOUNTER — Telehealth: Payer: Self-pay | Admitting: Family Medicine

## 2021-08-15 ENCOUNTER — Other Ambulatory Visit: Payer: Self-pay

## 2021-08-15 ENCOUNTER — Other Ambulatory Visit: Payer: Self-pay | Admitting: Family Medicine

## 2021-08-15 LAB — CBC WITH DIFFERENTIAL/PLATELET
Basophils Absolute: 0 10*3/uL (ref 0.0–0.2)
Basos: 0 %
EOS (ABSOLUTE): 0.1 10*3/uL (ref 0.0–0.4)
Eos: 2 %
Hematocrit: 39.3 % (ref 34.0–46.6)
Hemoglobin: 12.4 g/dL (ref 11.1–15.9)
Immature Grans (Abs): 0 10*3/uL (ref 0.0–0.1)
Immature Granulocytes: 0 %
Lymphocytes Absolute: 0.8 10*3/uL (ref 0.7–3.1)
Lymphs: 19 %
MCH: 27.1 pg (ref 26.6–33.0)
MCHC: 31.6 g/dL (ref 31.5–35.7)
MCV: 86 fL (ref 79–97)
Monocytes Absolute: 0.3 10*3/uL (ref 0.1–0.9)
Monocytes: 8 %
Neutrophils Absolute: 3.2 10*3/uL (ref 1.4–7.0)
Neutrophils: 71 %
Platelets: 169 10*3/uL (ref 150–450)
RBC: 4.58 x10E6/uL (ref 3.77–5.28)
RDW: 14.2 % (ref 11.7–15.4)
WBC: 4.5 10*3/uL (ref 3.4–10.8)

## 2021-08-15 LAB — COMPREHENSIVE METABOLIC PANEL
ALT: 19 IU/L (ref 0–32)
AST: 25 IU/L (ref 0–40)
Albumin/Globulin Ratio: 1.9 (ref 1.2–2.2)
Albumin: 4.3 g/dL (ref 3.7–4.7)
Alkaline Phosphatase: 63 IU/L (ref 44–121)
BUN/Creatinine Ratio: 18 (ref 12–28)
BUN: 19 mg/dL (ref 8–27)
Bilirubin Total: 0.2 mg/dL (ref 0.0–1.2)
CO2: 23 mmol/L (ref 20–29)
Calcium: 9.3 mg/dL (ref 8.7–10.3)
Chloride: 106 mmol/L (ref 96–106)
Creatinine, Ser: 1.07 mg/dL — ABNORMAL HIGH (ref 0.57–1.00)
Globulin, Total: 2.3 g/dL (ref 1.5–4.5)
Glucose: 128 mg/dL — ABNORMAL HIGH (ref 70–99)
Potassium: 4.4 mmol/L (ref 3.5–5.2)
Sodium: 143 mmol/L (ref 134–144)
Total Protein: 6.6 g/dL (ref 6.0–8.5)
eGFR: 55 mL/min/{1.73_m2} — ABNORMAL LOW (ref >59)

## 2021-08-15 LAB — VITAMIN B12: Vitamin B-12: >2000 pg/mL — ABNORMAL HIGH (ref 232–1245)

## 2021-08-15 LAB — SPECIMEN STATUS REPORT

## 2021-08-15 LAB — LIPID PANEL W/O CHOL/HDL RATIO
Cholesterol, Total: 99 mg/dL — ABNORMAL LOW (ref 100–199)
HDL: 41 mg/dL (ref >39)
LDL Chol Calc (NIH): 36 mg/dL (ref 0–99)
Triglycerides: 123 mg/dL (ref 0–149)
VLDL Cholesterol Cal: 22 mg/dL (ref 5–40)

## 2021-08-15 LAB — TSH: TSH: 2.55 u[IU]/mL (ref 0.450–4.500)

## 2021-08-15 NOTE — Telephone Encounter (Signed)
Duplicate. Requested Prescriptions  Pending Prescriptions Disp Refills   albuterol (VENTOLIN HFA) 108 (90 Base) MCG/ACT inhaler [Pharmacy Med Name: ALBUTEROL HFA INH (200 PUFFS) 6.7GM] 20.1 g     Sig: INHALE 2 PUFFS INTO THE LUNGS EVERY 6 HOURS AS NEEDED FOR WHEEZING OR SHORTNESS OF BREATH     Pulmonology:  Beta Agonists Failed - 08/14/2021  5:24 PM      Failed - One inhaler should last at least one month. If the patient is requesting refills earlier, contact the patient to check for uncontrolled symptoms.      Passed - Valid encounter within last 12 months    Recent Outpatient Visits          Yesterday Open wound of right upper arm, initial encounter   [place] [facility] [place], Megan P, DO   6 months ago Seizure-like activity [facility])   [facility] [place], [place], DO   8 months ago Tobacco abuse   [facility] [place], [place], DO   11 months ago Acute recurrent frontal sinusitis   [facility] [place], [place], DO   1 year ago Anxiety   [facility] [place], [place], DO

## 2021-08-15 NOTE — Telephone Encounter (Signed)
Copied from CRM [id number]. Topic: Quick Communication - Rx Refill/Question >> Aug 15, 2021  1:46 PM Pawlus, Maxine Glenn A wrote: Pharmacist was calling in for clarification regarding magic mouthwash (nystatin, lidocaine, diphenhydrAMINE, alum & mag hydroxide) suspension , caller stated she needed to know the formulation, please advise.

## 2021-08-15 NOTE — Telephone Encounter (Signed)
Please provide ratio or quantity of each ingredients.

## 2021-08-15 NOTE — Telephone Encounter (Signed)
Pt called in to follow up on this Rx refill, please advise.

## 2021-08-15 NOTE — Telephone Encounter (Signed)
Requested medication (s) are due for refill today: This is old rx wants new rx to go to [place], [place], no longer uses other pharm  Requested medication (s) are on the active medication list: yes  Last refill:  This is old rx, has newer one at pharm no longer using  Future visit scheduled: 09/11/21, was seen yesterday 08/14/21  Notes to clinic:  Pt's daughter states that she handles her mothers medications and when I questioned if pt taking the 150mg  of Trazodone and wanting to add Pamelor she stated that the Trazodone is as needed and the Nortriptyline is every night. On her current med list they are both listed as scheduled meds. Trazodone 100mg  with a 50mg  together nightly as well as Pamelor by mouth at bedtime. She would like the Nortriptyline sent to [facility] and discontinued at previous pharm.  Requested Prescriptions  Pending Prescriptions Disp Refills   nortriptyline (PAMELOR) 50 MG capsule [Pharmacy Med Name: NORTRIPTYLINE 50MG  CAPSULES] 90 capsule 0    Sig: TAKE 1 CAPSULE(50 MG) BY MOUTH AT BEDTIME     Psychiatry:  Antidepressants - Heterocyclics (TCAs) Passed - 08/15/2021  1:08 PM      Passed - Valid encounter within last 6 months    Recent Outpatient Visits           Yesterday Open wound of right upper arm, initial encounter   [facility] [place], Megan P, DO   6 months ago Seizure-like activity [facility])   [facility] [place], [place], DO   8 months ago Tobacco abuse   [facility] [place], [place], DO   11 months ago Acute recurrent frontal sinusitis   [facility] [place], [place], DO   1 year ago Anxiety   [facility] [place], [place], DO

## 2021-08-16 MED ORDER — NYSTATIN 100000 UNIT/ML MT SUSP: 5.0000 mL | Freq: Three times a day (TID) | OROMUCOSAL | 1 refills | Status: DC | PRN

## 2021-08-16 NOTE — Telephone Encounter (Signed)
Spoke with Dr.Johnson and she has updated the prescription and sent to patient's local pharmacy.

## 2021-08-19 ENCOUNTER — Encounter: Payer: Self-pay | Admitting: Family Medicine

## 2021-08-19 DIAGNOSIS — I25118 Atherosclerotic heart disease of native coronary artery with other forms of angina pectoris: Secondary | ICD-10-CM | POA: Insufficient documentation

## 2021-08-19 MED ORDER — NORTRIPTYLINE HCL 50 MG PO CAPS: ORAL_CAPSULE | ORAL | 1 refills | Status: AC

## 2021-08-19 MED ORDER — CITALOPRAM HYDROBROMIDE 40 MG PO TABS
40.0000 mg | ORAL_TABLET | Freq: Every day | ORAL | 1 refills | Status: DC
Start: 2021-08-19 — End: 2021-10-08

## 2021-08-19 MED ORDER — AMLODIPINE BESYLATE 10 MG PO TABS: 10.0000 mg | ORAL_TABLET | Freq: Every day | ORAL | 1 refills | Status: AC

## 2021-08-19 MED ORDER — ISOSORBIDE MONONITRATE ER 60 MG PO TB24: 60.0000 mg | ORAL_TABLET | Freq: Two times a day (BID) | ORAL | 1 refills | Status: AC

## 2021-08-19 MED ORDER — ALBUTEROL SULFATE HFA 108 (90 BASE) MCG/ACT IN AERS: 2.0000 | INHALATION_SPRAY | Freq: Four times a day (QID) | RESPIRATORY_TRACT | 4 refills | Status: AC | PRN

## 2021-08-19 MED ORDER — PREGABALIN 150 MG PO CAPS: 150.0000 mg | ORAL_CAPSULE | Freq: Two times a day (BID) | ORAL | 1 refills | Status: AC

## 2021-08-19 MED ORDER — TRAZODONE HCL 100 MG PO TABS: 100.0000 mg | ORAL_TABLET | Freq: Every day | ORAL | 1 refills | Status: AC

## 2021-08-19 MED ORDER — FENOFIBRATE 200 MG PO CAPS: ORAL_CAPSULE | ORAL | 1 refills | Status: AC

## 2021-08-19 MED ORDER — OMEPRAZOLE 40 MG PO CPDR: 40.0000 mg | DELAYED_RELEASE_CAPSULE | Freq: Two times a day (BID) | ORAL | 1 refills | Status: AC

## 2021-08-19 MED ORDER — TRAZODONE HCL 50 MG PO TABS: 50.0000 mg | ORAL_TABLET | Freq: Every day | ORAL | 1 refills | Status: AC

## 2021-08-19 NOTE — Assessment & Plan Note (Signed)
Under good control on current regimen. Continue current regimen. Continue to monitor. Call with any concerns. Refills given. Labs drawn today.   

## 2021-08-19 NOTE — Assessment & Plan Note (Signed)
Tolerating lyrica well. Refills given today. Call with any concerns.

## 2021-08-19 NOTE — Assessment & Plan Note (Signed)
Will keep BP, cholesterol and sugars under good control. Await results. Treat as needed.

## 2021-08-19 NOTE — Assessment & Plan Note (Signed)
Doing well. Continue to follow with neurology. Call with any concerns.

## 2021-08-19 NOTE — Assessment & Plan Note (Signed)
Does not want to take lisinopril. Will stop and see how BP does. Will likely need additional medicine- Check 1 month.

## 2021-09-10 ENCOUNTER — Other Ambulatory Visit: Payer: Self-pay | Admitting: Family Medicine

## 2021-09-11 ENCOUNTER — Telehealth: Payer: Self-pay | Admitting: Family Medicine

## 2021-09-11 ENCOUNTER — Telehealth: Payer: Federal, State, Local not specified - PPO | Admitting: Family Medicine

## 2021-09-11 NOTE — Telephone Encounter (Signed)
Copied from CRM [id number]. Topic: General - Other >> Sep 11, 2021 12:39 PM Glean Salen wrote: Reason for SAY:[id number] called in for pat says was admitted in the nursing faclity on 02/15 and she ill be in for 20 days

## 2021-09-11 NOTE — Telephone Encounter (Signed)
Refilled 08/19/2021 #90 1 refill - 6 month supply. Requested Prescriptions  Pending Prescriptions Disp Refills   citalopram (CELEXA) 40 MG tablet [Pharmacy Med Name: CITALOPRAM 40MG  TABLETS] 30 tablet     Sig: TAKE 1T BY MOUTH EVERY DAY     Psychiatry:  Antidepressants - SSRI Passed - 09/10/2021 11:42 AM      Passed - Valid encounter within last 6 months    Recent Outpatient Visits          4 weeks ago Routine general medical examination at a health care facility   [facility], Megan P, DO   7 months ago Seizure-like activity [facility])   [facility] [place], [place], DO   9 months ago Tobacco abuse   [facility] [place], [place], DO   12 months ago Acute recurrent frontal sinusitis   [facility] [place], [place], DO   1 year ago Anxiety   [facility] [place], [place], DO

## 2021-09-11 NOTE — Telephone Encounter (Signed)
Routing to provider as an FYI

## 2021-09-11 NOTE — Telephone Encounter (Signed)
Noted  

## 2021-09-26 ENCOUNTER — Telehealth: Payer: Federal, State, Local not specified - PPO | Admitting: Family Medicine

## 2021-09-28 ENCOUNTER — Inpatient Hospital Stay: Payer: Federal, State, Local not specified - PPO | Admitting: Family Medicine

## 2021-10-08 ENCOUNTER — Other Ambulatory Visit: Payer: Self-pay

## 2021-10-08 ENCOUNTER — Encounter: Payer: Self-pay | Admitting: Family Medicine

## 2021-10-08 ENCOUNTER — Ambulatory Visit (INDEPENDENT_AMBULATORY_CARE_PROVIDER_SITE_OTHER): Payer: Federal, State, Local not specified - PPO | Admitting: Family Medicine

## 2021-10-08 VITALS — BP 145/74 | HR 70 | Temp 97.7°F | Ht 64.02 in | Wt 228.4 lb

## 2021-10-08 DIAGNOSIS — G8929 Other chronic pain: Secondary | ICD-10-CM

## 2021-10-08 DIAGNOSIS — E538 Deficiency of other specified B group vitamins: Secondary | ICD-10-CM

## 2021-10-08 DIAGNOSIS — I10 Essential (primary) hypertension: Secondary | ICD-10-CM

## 2021-10-08 DIAGNOSIS — F339 Major depressive disorder, recurrent, unspecified: Secondary | ICD-10-CM

## 2021-10-08 DIAGNOSIS — R0902 Hypoxemia: Secondary | ICD-10-CM | POA: Diagnosis not present

## 2021-10-08 DIAGNOSIS — M25561 Pain in right knee: Secondary | ICD-10-CM | POA: Diagnosis not present

## 2021-10-08 DIAGNOSIS — F419 Anxiety disorder, unspecified: Secondary | ICD-10-CM | POA: Diagnosis not present

## 2021-10-08 DIAGNOSIS — M25562 Pain in left knee: Secondary | ICD-10-CM

## 2021-10-08 MED ORDER — LISINOPRIL 40 MG PO TABS: 40.0000 mg | ORAL_TABLET | Freq: Two times a day (BID) | ORAL | 1 refills | Status: AC

## 2021-10-08 MED ORDER — CITALOPRAM HYDROBROMIDE 20 MG PO TABS: 30.0000 mg | ORAL_TABLET | Freq: Two times a day (BID) | ORAL | 1 refills | Status: AC

## 2021-10-08 MED ORDER — CYANOCOBALAMIN 1000 MCG/ML IJ SOLN
1000.0000 ug | Freq: Once | INTRAMUSCULAR | Status: AC
  Administered 2021-10-08: 1000 ug via INTRAMUSCULAR

## 2021-10-08 MED ORDER — NYSTATIN 100000 UNIT/ML MT SUSP: 5.0000 mL | Freq: Three times a day (TID) | OROMUCOSAL | 1 refills | Status: AC | PRN

## 2021-10-08 NOTE — Progress Notes (Signed)
? ?BP (!) 145/74   Pulse 70   Temp 97.7 ?F (36.5 ?C) (Oral)   Ht 5' 4.02" (1.626 m)   Wt 228 lb 6.4 oz (103.6 kg)   SpO2 93%   BMI 39.19 kg/m?   ? ?Subjective:  ? ? Patient ID: Tara [name], female    DOB: 1946/02/23, 76 y.o.   MRN: [id number] ? ?HPI: ?Tara [name] is a 76 y.o. female ? ?Chief Complaint  ?Patient presents with  ? Fall  ?  Fell in Feb.   ? ?Transition of Care Hospital Follow up.  ? ?Hospital/Facility: [facility] ?D/C Physician: Cherly Hensen, MD ?D/C Date: 09/05/2021 out of rehab 2 weeks ago ? ?Records Requested: 10/08/21 ?Records Received: 10/08/21 ?Records Reviewed: 10/08/21 ? ?Diagnoses on Discharge: Acute respiratory failure with hypoxia (CMS-HCC) ?Active Problems: ?Primary hypertension ?Coronary artery disease involving native coronary artery of native heart without angina pectoris ?PAD (peripheral artery disease) (CMS-HCC) ?History of cerebrovascular accident (CVA) with residual deficit ? ?Date of interactive Contact within 48 hours of discharge: NOT DONE ?Contact was through: N/A ? ?Date of 7 day or 14 day face-to-face visit: 10/08/21  within 14 days ? ?Outpatient Encounter Medications as of 10/08/2021  ?Medication Sig  ? albuterol (VENTOLIN HFA) 108 (90 Base) MCG/ACT inhaler Inhale 2 puffs into the lungs every 6 (six) hours as needed for wheezing or shortness of breath.  ? amLODipine (NORVASC) 10 MG tablet Take 1 tablet (10 mg total) by mouth daily.  ? aspirin EC 81 MG tablet Take 81 mg by mouth daily. Swallow whole.  ? B-D TB SYRINGE 1CC/27GX1/2" 27G X 1/2" 1 ML MISC USE AS DIRECTED EVERY 30 DAYS  ? baclofen (LIORESAL) 10 MG tablet Take by mouth.  ? clopidogrel (PLAVIX) 75 MG tablet Take 1 tablet (75 mg total) by mouth daily.  ? cyanocobalamin (,VITAMIN B-12,) 1000 MCG/ML injection Inject 1 mL (1,000 mcg total) into the muscle every 30 (thirty) days.  ? diclofenac Sodium (VOLTAREN) 1 % GEL Apply 4 g topically 4 (four) times daily.  ?  diphenoxylate-atropine (LOMOTIL) 2.5-0.025 MG tablet Take 1 tablet by mouth 4 (four) times daily as needed for diarrhea or loose stools.  ? donepezil (ARICEPT) 5 MG tablet Take 5 mg by mouth daily.  ? fenofibrate micronized (LOFIBRA) 200 MG capsule TAKE 1 CAPSULE(200 MG) BY MOUTH DAILY  ? fluticasone (FLONASE) 50 MCG/ACT nasal spray Place 1-2 sprays into both nostrils daily.  ? Insulin Syringe-Needle U-100 (INSULIN SYRINGE 1CC/30GX5/16") 30G X 5/16" 1 ML MISC 1 each by Does not apply route every 30 (thirty) days.  ? isosorbide mononitrate (IMDUR) 60 MG 24 hr tablet Take 1 tablet (60 mg total) by mouth in the morning and at bedtime.  ? lidocaine (LIDODERM) 5 % Place 2 patches onto the skin daily.  ? loperamide (IMODIUM) 2 MG capsule Take 2 mg by mouth as needed for diarrhea or loose stools.  ? montelukast (SINGULAIR) 10 MG tablet Take 10 mg by mouth daily.  ? naproxen (NAPROSYN) 375 MG tablet Take 1 tablet (375 mg total) by mouth 2 (two) times daily as needed for mild pain or moderate pain.  ? neomycin-bacitracin-polymyxin (NEOSPORIN) OINT Apply 1 application topically as needed for wound care.  ? nitroGLYCERIN (NITROSTAT) 0.4 MG SL tablet Place 1 tablet (0.4 mg total) under the tongue every 5 (five) minutes as needed for chest pain.  ? nortriptyline (PAMELOR) 50 MG capsule Take 1 capsule (50 mg total) by mouth at bedtime.  ? omeprazole (  PRILOSEC) 40 MG capsule Take 1 capsule (40 mg total) by mouth 2 (two) times daily. TAKE 1 CAPSULE BY MOUTH TWICE DAILY  ? pregabalin (LYRICA) 150 MG capsule Take 1 capsule (150 mg total) by mouth 2 (two) times daily.  ? traZODone (DESYREL) 100 MG tablet Take 1 tablet (100 mg total) by mouth at bedtime. Take along with one 50 mg tablet for total 150 mg at bedtime  ? traZODone (DESYREL) 50 MG tablet Take 1 tablet (50 mg total) by mouth at bedtime. Take along with one 100 mg tablet for total 150 mg at bedtime  ? [DISCONTINUED] citalopram (CELEXA) 40 MG tablet Take 1 tablet (40 mg total)  by mouth daily.  ? [DISCONTINUED] magic mouthwash (nystatin, lidocaine, diphenhydrAMINE, alum & mag hydroxide) suspension Swish and spit 5 mLs 3 (three) times daily as needed for mouth pain.  ? citalopram (CELEXA) 20 MG tablet Take 1.5 tablets (30 mg total) by mouth 2 (two) times daily.  ? lisinopril (ZESTRIL) 40 MG tablet Take 1 tablet (40 mg total) by mouth 2 (two) times daily.  ? magic mouthwash (nystatin, lidocaine, diphenhydrAMINE, alum & mag hydroxide) suspension Swish and spit 5 mLs 3 (three) times daily as needed for mouth pain.  ? rosuvastatin (CRESTOR) 40 MG tablet Take 1 tablet (40 mg total) by mouth daily.  ? [DISCONTINUED] amoxicillin-clavulanate (AUGMENTIN) 875-125 MG tablet Take 1 tablet by mouth 2 (two) times daily.  ? [EXPIRED] cyanocobalamin ((VITAMIN B-12)) injection 1,000 mcg   ? ?No facility-administered encounter medications on file as of 10/08/2021.  ?Per Hospitalist: "Hospital Course:  ?##Hypoxemia  weakness: Patient with recent diagnosis of pneumonia currently on Augmentin and status post azithromycin presenting with weakness and fatigue found to desat to 89% on exertion.  Chest x-ray was clear and echo was normal EF with only grade 1 diastolic dysfunction. Differential includes resolving pneumonia with possible undiagnosed COPD or OSA.  She completed her course of Augmentin and worked with physical therapy who recommended discharge to SNF where she went on hospital day 5. ?  ?##CAD  PAD: No chest pain, stable EKG and normal troponin. No acute concerns.  Continued home meds of aspirin, Plavix, statin, fenofibrate and Lyrica. ?  ?##Hypertension: BP is low in setting of infection and poor p.o. intake, initially held all meds but amlodipine 10mg  was restarted with persistent lower blood pressures 110s/80s. We held lisinopril and imdur on discharge but patient might benefit from restarting out patient. ?  ?##Anxiety: Currently worsened in the setting of the death of her husband and unstable  living conditions.  We continued her home regimen of Celexa, nortriptyline and trazodone but would recommend possible up titration with PCP. ?  ?##History of CVA: Seems to mostly have memory deficits.  Repeat CT head this admission without acute changes. Continued home meds of aspirin, statin and donepezil ?  ?#GERD: Continued home PPI"  ? ?Diagnostic Tests Reviewed: Hospital Radiology: ?ECG 12 Lead ? ?Result Date: 09/03/2021 ?NORMAL SINUS RHYTHM MINIMAL VOLTAGE CRITERIA FOR LVH, MAY BE NORMAL VARIANT ( R in aVL ) NONSPECIFIC ST AND T WAVE ABNORMALITY NO SIGNIFICANT CHANGE SINCE LAST TRACING Confirmed by Tor Netters [phone]) on 09/03/2021 12:00:21 PM ? ?XR Chest Portable ? ?Result Date: 08/31/2021 ?EXAM: XR CHEST PORTABLE DATE: 08/31/2021 11:48 AM ACCESSION: [id number] [facility] DICTATED: 08/31/2021 11:55 AM INTERPRETATION LOCATION: [place] CLINICAL INDICATION: 76 years old Female with CHEST PAIN TECHNIQUE: Portable Chest Radiograph. COMPARISON: 27 May 2021 FINDINGS: No support devices. Lungs are clear. No pleural effusion. No pneumothorax. Heart  is normal in size. Aorta with calcifications. Median sternotomy suture wires.  ? ?*No acute cardiopulmonary disease. ? ?CT Head Wo Contrast ? ?Result Date: 08/31/2021 ?EXAM: Computed tomography, head or brain without contrast material. DATE: 08/31/2021 12:32 PM ACCESSION: [id number] [facility] DICTATED: 08/31/2021 12:54 PM INTERPRETATION LOCATION: [place] CLINICAL INDICATION: 76 years old Female with Rule Out Stroke ; Neuro deficit, acute, stroke suspected COMPARISON: None TECHNIQUE: Axial CT images of the head from skull base to vertex without contrast. FINDINGS: Bilateral chronic PCA territory infarctions. Remote lacunar infarctions in the right caudate head and putamen and left centrum semiovale. Moderate microvascular disease. Mild brain atrophy. Ventricles are appropriate in size. No acute infarction. No midline shift. No skull fracture. Paranasal sinuses pneumatized.   ? ?No acute infarction. Multiple remote infarctions. Microvascular disease and brain atrophy. ? ?Echocardiogram W Colorflow Spectral Doppler ? ?Result Date: 09/01/2021 ?Patient Info Name: AARIN FRYLING Age: 65 years DOB: [birth year]

## 2021-10-10 ENCOUNTER — Telehealth: Payer: Self-pay | Admitting: Family Medicine

## 2021-10-10 NOTE — Telephone Encounter (Signed)
Copied from CRM [id number]. Topic: Quick Communication - Home Health Verbal Orders ?>> Oct 10, 2021  3:55 PM Aretta Nip wrote: ?Caller/Agency: [facility]/ [name] ?Callback Number: [phone] ?Requesting Skilled Nursing/ 1 time a wk for 4 wks/ pt has yeast rash red inflamed, both breast/under tummy  Also wants to see if  may have Home Health Aid 2 times a wk for 4 wk ?

## 2021-10-11 NOTE — Telephone Encounter (Signed)
OK for verbal orders?

## 2021-10-11 NOTE — Telephone Encounter (Signed)
Spoke with [name] with Home Health and provided her with OK for verbal orders. [name] verbalized understanding and has no further questions at this time.  ?

## 2021-10-11 NOTE — Telephone Encounter (Signed)
Call to provide OK for verbal orders per Dr.Johnson. Received an error message and call was did not go through. Will attempt to reach [name] again.  ? ?Ok for PEC/Nurse Triage to give note if [name] calls back.  ?

## 2021-10-14 DIAGNOSIS — F339 Major depressive disorder, recurrent, unspecified: Secondary | ICD-10-CM | POA: Insufficient documentation

## 2021-10-14 NOTE — Assessment & Plan Note (Signed)
See discussion under depression.  ?

## 2021-10-14 NOTE — Assessment & Plan Note (Signed)
Will increase her celexa to 60mg . Recheck 2-3 weeks virtually, if not getting better, consider abilify. Continue to monitor. Call with any concerns.  ?

## 2021-10-14 NOTE — Assessment & Plan Note (Signed)
Under good control on current regimen. Continue current regimen. Continue to monitor. Call with any concerns. Refills given.   

## 2021-11-01 ENCOUNTER — Telehealth: Payer: Self-pay | Admitting: Family Medicine

## 2021-11-01 ENCOUNTER — Ambulatory Visit: Payer: Self-pay

## 2021-11-01 NOTE — Telephone Encounter (Signed)
Shimna with [employer] calling to stare the pt no longer needs 3-1 bedside commode. The daughter states they have one. ? ?Cb [phone] ?

## 2021-11-01 NOTE — Telephone Encounter (Signed)
?  Chief Complaint: Fall ?Symptoms: right arm and buttocks are sore. Pt did hit her head on the shower bar when falling. ?Frequency: 4:30 AM ?Pertinent Negatives: Patient denies HA, Loss of consciousness. ?Disposition: [] ED /[] Urgent Care (no appt availability in office) / [] Appointment(In office/virtual)/ []  [place] [facility]/ [] Home Care/ [] Refused Recommended Disposition /[] [place] [facility]/ [x]  Follow-up with PCP ?Additional Notes: Call from  [facility] at the home. Pt fell this morning at 4:30 am while trying to get off of the toilet. Pt was wearing a slipper that slipped. Pt hit her head on the shower bar, temple left side, and her left sacral area on the tub. Per pt 's arm is a bit red as is her buttocks , but no bruising. Shanina rates the pt's pain at 4/10. states that pt is acting normally and seems to have no neurological deficits. will continue to monitor pt during her visit with pt, and will speak with family care after fall. ? ? ? ?Reason for Disposition ? Small bruise is present ? ?Answer Assessment - Initial Assessment Questions ?1. MECHANISM: "How did the fall happen?" ?    Foot  ?2. DOMESTIC VIOLENCE AND ELDER ABUSE SCREENING: "Did you fall because someone pushed you or tried to hurt you?" If Yes, ask: "Are you safe now?" ?    *No Answer* ?3. ONSET: "When did the fall happen?" (e.g., minutes, hours, or days ago) ?    4:30 AM ?4. LOCATION: "What part of the body hit the ground?" (e.g., back, buttocks, head, hips, knees, hands, head, stomach) ?    Hit head on shower bar.  ?5. INJURY: "Did you hurt (injure) yourself when you fell?" If Yes, ask: "What did you injure? Tell me more about this?" (e.g., body area; type of injury; pain severity)" ?    *No Answer* ?6. PAIN: "Is there any pain?" If Yes, ask: "How bad is the pain?" (e.g., Scale 1-10; or mild,  ?moderate, severe) ?  - NONE (0): No pain ?  - MILD (1-3): Doesn't interfere with normal activities  ?  - MODERATE  (4-7): Interferes with normal activities or awakens from sleep  ?  - SEVERE (8-10): Excruciating pain, unable to do any normal activities  ?    *No Answer* ?7. SIZE: For cuts, bruises, or swelling, ask: "How large is it?" (e.g., inches or centimeters)  ?    *No Answer* ?8. PREGNANCY: "Is there any chance you are pregnant?" "When was your last menstrual period?" ?    na ?9. OTHER SYMPTOMS: "Do you have any other symptoms?" (e.g., dizziness, fever, weakness; new onset or worsening).  ?    *No Answer* ?10. CAUSE: "What do you think caused the fall (or falling)?" (e.g., tripped, dizzy spell) ?      *No Answer* ? ?Protocols used: Falls and Falling-A-AH ? ?

## 2021-11-02 ENCOUNTER — Telehealth: Payer: Self-pay | Admitting: Family Medicine

## 2021-11-02 NOTE — Telephone Encounter (Signed)
Home Health Verbal Orders - Caller/Agency: Christen Bame PT from [facility]  ?Callback Number: [phone] ?Requesting OT/PT/Skilled Nursing/Social Work/Speech Therapy: Addressing a fall, fall recovery/fall prevention  ?Frequency:  ? ?1w1 ?1 -every 2 weeks- 1 ? ?

## 2021-11-05 NOTE — Telephone Encounter (Signed)
OK for verbal orders?

## 2021-11-05 NOTE — Telephone Encounter (Signed)
Spoke with Shemena from [facility] and provided her with OK verbal orders for patient. Verbalized understanding and has no further questions. ?

## 2021-11-07 ENCOUNTER — Telehealth: Payer: Federal, State, Local not specified - PPO | Admitting: Family Medicine

## 2021-11-07 ENCOUNTER — Encounter: Payer: Self-pay | Admitting: Family Medicine

## 2021-11-09 ENCOUNTER — Other Ambulatory Visit (INDEPENDENT_AMBULATORY_CARE_PROVIDER_SITE_OTHER): Payer: Federal, State, Local not specified - PPO | Admitting: Family Medicine

## 2021-11-09 DIAGNOSIS — I739 Peripheral vascular disease, unspecified: Secondary | ICD-10-CM

## 2021-11-09 DIAGNOSIS — F419 Anxiety disorder, unspecified: Secondary | ICD-10-CM

## 2021-11-09 DIAGNOSIS — G629 Polyneuropathy, unspecified: Secondary | ICD-10-CM

## 2021-11-09 DIAGNOSIS — J9601 Acute respiratory failure with hypoxia: Secondary | ICD-10-CM

## 2021-11-09 DIAGNOSIS — M170 Bilateral primary osteoarthritis of knee: Secondary | ICD-10-CM

## 2021-11-09 DIAGNOSIS — Z720 Tobacco use: Secondary | ICD-10-CM

## 2021-11-09 DIAGNOSIS — K219 Gastro-esophageal reflux disease without esophagitis: Secondary | ICD-10-CM

## 2021-11-09 DIAGNOSIS — J9602 Acute respiratory failure with hypercapnia: Secondary | ICD-10-CM

## 2021-11-09 DIAGNOSIS — I890 Lymphedema, not elsewhere classified: Secondary | ICD-10-CM

## 2021-11-09 DIAGNOSIS — I25118 Atherosclerotic heart disease of native coronary artery with other forms of angina pectoris: Secondary | ICD-10-CM

## 2021-11-09 DIAGNOSIS — I70213 Atherosclerosis of native arteries of extremities with intermittent claudication, bilateral legs: Secondary | ICD-10-CM

## 2021-11-09 DIAGNOSIS — F339 Major depressive disorder, recurrent, unspecified: Secondary | ICD-10-CM

## 2021-11-09 DIAGNOSIS — M5134 Other intervertebral disc degeneration, thoracic region: Secondary | ICD-10-CM

## 2021-11-09 DIAGNOSIS — R531 Weakness: Secondary | ICD-10-CM

## 2021-11-09 DIAGNOSIS — I10 Essential (primary) hypertension: Secondary | ICD-10-CM

## 2021-11-09 NOTE — Progress Notes (Signed)
Received home health orders orders from [facility]. ?Start of care 09/24/21.   Certification and orders from 09/25/19 through 11/22/21 are reviewed, signed and faxed back to home health company. ? ?Need of intermittent skilled services at home: Skilled Physical therapy to address fall with fracture. Balance retraining ? ?The home health care plan has been established by me and will be reviewed and updated as needed to maximize patient recovery.  I certify that all home health services have been and will be furnished to the patient while under my care. ? ?Face-to-face encounter in which the need for home health services was established: Hospital Discharge from [facility] 08/31/21- see discharge summary. ? ?Patient is receiving home health services for the following diagnoses: ?Problem List Items Addressed This Visit   ? ?  ? Cardiovascular and Mediastinum  ? HTN (hypertension)  ? PAD (peripheral artery disease) ([place])  ? Atherosclerosis of native arteries of extremity with intermittent claudication ([place])  ? Coronary artery disease of native artery of native heart with stable angina pectoris ([place])  ?  ? Respiratory  ? Acute respiratory failure with hypoxia and hypercarbia (HCC)  ?  ? Digestive  ? GERD (gastroesophageal reflux disease)  ?  ? Nervous and Auditory  ? Polyneuropathy, unspecified  ?  ? Musculoskeletal and Integument  ? Arthritis of both knees - Primary  ? Other intervertebral disc degeneration, thoracic region  ?  ? Other  ? Anxiety  ? Lymphedema  ? Tobacco abuse  ? Weakness  ? Depression, recurrent ([place])  ? ? ? ?Park Liter, DO  ? ?

## 2021-11-13 ENCOUNTER — Other Ambulatory Visit: Payer: Self-pay | Admitting: Family

## 2021-11-13 DIAGNOSIS — E785 Hyperlipidemia, unspecified: Secondary | ICD-10-CM

## 2021-11-13 NOTE — Telephone Encounter (Signed)
Please schedule overdue F/U appointment for refills. Thank you! 

## 2022-01-02 ENCOUNTER — Other Ambulatory Visit: Payer: Self-pay

## 2022-01-02 ENCOUNTER — Emergency Department (HOSPITAL_COMMUNITY): Payer: Medicare Other

## 2022-01-02 ENCOUNTER — Encounter (HOSPITAL_COMMUNITY): Payer: Self-pay

## 2022-01-02 ENCOUNTER — Inpatient Hospital Stay (HOSPITAL_COMMUNITY)
Admission: EM | Admit: 2022-01-02 | Discharge: 2022-01-19 | DRG: 682 | Disposition: E | Payer: Medicare Other | Attending: Internal Medicine | Admitting: Internal Medicine

## 2022-01-02 DIAGNOSIS — I252 Old myocardial infarction: Secondary | ICD-10-CM

## 2022-01-02 DIAGNOSIS — N3000 Acute cystitis without hematuria: Secondary | ICD-10-CM | POA: Diagnosis present

## 2022-01-02 DIAGNOSIS — E785 Hyperlipidemia, unspecified: Secondary | ICD-10-CM | POA: Diagnosis present

## 2022-01-02 DIAGNOSIS — Z955 Presence of coronary angioplasty implant and graft: Secondary | ICD-10-CM

## 2022-01-02 DIAGNOSIS — M25561 Pain in right knee: Secondary | ICD-10-CM | POA: Diagnosis present

## 2022-01-02 DIAGNOSIS — G9341 Metabolic encephalopathy: Secondary | ICD-10-CM | POA: Diagnosis present

## 2022-01-02 DIAGNOSIS — M25562 Pain in left knee: Secondary | ICD-10-CM | POA: Diagnosis present

## 2022-01-02 DIAGNOSIS — I69398 Other sequelae of cerebral infarction: Secondary | ICD-10-CM

## 2022-01-02 DIAGNOSIS — F17210 Nicotine dependence, cigarettes, uncomplicated: Secondary | ICD-10-CM | POA: Diagnosis present

## 2022-01-02 DIAGNOSIS — Z7189 Other specified counseling: Secondary | ICD-10-CM | POA: Diagnosis not present

## 2022-01-02 DIAGNOSIS — F32A Depression, unspecified: Secondary | ICD-10-CM | POA: Diagnosis present

## 2022-01-02 DIAGNOSIS — R29810 Facial weakness: Secondary | ICD-10-CM | POA: Diagnosis present

## 2022-01-02 DIAGNOSIS — E861 Hypovolemia: Secondary | ICD-10-CM | POA: Diagnosis present

## 2022-01-02 DIAGNOSIS — E860 Dehydration: Secondary | ICD-10-CM | POA: Diagnosis present

## 2022-01-02 DIAGNOSIS — Z515 Encounter for palliative care: Secondary | ICD-10-CM | POA: Diagnosis not present

## 2022-01-02 DIAGNOSIS — E1165 Type 2 diabetes mellitus with hyperglycemia: Secondary | ICD-10-CM | POA: Diagnosis present

## 2022-01-02 DIAGNOSIS — N179 Acute kidney failure, unspecified: Principal | ICD-10-CM | POA: Diagnosis present

## 2022-01-02 DIAGNOSIS — H538 Other visual disturbances: Secondary | ICD-10-CM | POA: Diagnosis present

## 2022-01-02 DIAGNOSIS — F411 Generalized anxiety disorder: Secondary | ICD-10-CM | POA: Diagnosis present

## 2022-01-02 DIAGNOSIS — Z7982 Long term (current) use of aspirin: Secondary | ICD-10-CM

## 2022-01-02 DIAGNOSIS — G9340 Encephalopathy, unspecified: Secondary | ICD-10-CM | POA: Diagnosis present

## 2022-01-02 DIAGNOSIS — E1151 Type 2 diabetes mellitus with diabetic peripheral angiopathy without gangrene: Secondary | ICD-10-CM | POA: Diagnosis present

## 2022-01-02 DIAGNOSIS — G8929 Other chronic pain: Secondary | ICD-10-CM | POA: Diagnosis present

## 2022-01-02 DIAGNOSIS — I110 Hypertensive heart disease with heart failure: Secondary | ICD-10-CM | POA: Diagnosis present

## 2022-01-02 DIAGNOSIS — I5032 Chronic diastolic (congestive) heart failure: Secondary | ICD-10-CM | POA: Diagnosis present

## 2022-01-02 DIAGNOSIS — R531 Weakness: Secondary | ICD-10-CM

## 2022-01-02 DIAGNOSIS — Z79899 Other long term (current) drug therapy: Secondary | ICD-10-CM

## 2022-01-02 DIAGNOSIS — Z952 Presence of prosthetic heart valve: Secondary | ICD-10-CM | POA: Diagnosis not present

## 2022-01-02 DIAGNOSIS — R0609 Other forms of dyspnea: Secondary | ICD-10-CM | POA: Diagnosis not present

## 2022-01-02 DIAGNOSIS — Z66 Do not resuscitate: Secondary | ICD-10-CM | POA: Diagnosis present

## 2022-01-02 DIAGNOSIS — I2510 Atherosclerotic heart disease of native coronary artery without angina pectoris: Secondary | ICD-10-CM | POA: Diagnosis present

## 2022-01-02 DIAGNOSIS — R278 Other lack of coordination: Secondary | ICD-10-CM | POA: Diagnosis present

## 2022-01-02 DIAGNOSIS — R627 Adult failure to thrive: Secondary | ICD-10-CM | POA: Diagnosis present

## 2022-01-02 DIAGNOSIS — R471 Dysarthria and anarthria: Secondary | ICD-10-CM | POA: Diagnosis present

## 2022-01-02 DIAGNOSIS — Z7401 Bed confinement status: Secondary | ICD-10-CM | POA: Diagnosis not present

## 2022-01-02 DIAGNOSIS — Z7902 Long term (current) use of antithrombotics/antiplatelets: Secondary | ICD-10-CM

## 2022-01-02 LAB — CBC
HCT: 37.8 % (ref 36.0–46.0)
Hemoglobin: 11.3 g/dL — ABNORMAL LOW (ref 12.0–15.0)
MCH: 25.2 pg — ABNORMAL LOW (ref 26.0–34.0)
MCHC: 29.9 g/dL — ABNORMAL LOW (ref 30.0–36.0)
MCV: 84.2 fL (ref 80.0–100.0)
Platelets: 181 10*3/uL (ref 150–400)
RBC: 4.49 MIL/uL (ref 3.87–5.11)
RDW: 16.7 % — ABNORMAL HIGH (ref 11.5–15.5)
WBC: 6 10*3/uL (ref 4.0–10.5)
nRBC: 0 % (ref 0.0–0.2)

## 2022-01-02 LAB — COMPREHENSIVE METABOLIC PANEL
ALT: 15 U/L (ref 0–44)
AST: 24 U/L (ref 15–41)
Albumin: 3.5 g/dL (ref 3.5–5.0)
Alkaline Phosphatase: 47 U/L (ref 38–126)
Anion gap: 8 (ref 5–15)
BUN: 38 mg/dL — ABNORMAL HIGH (ref 8–23)
CO2: 22 mmol/L (ref 22–32)
Calcium: 9 mg/dL (ref 8.9–10.3)
Chloride: 111 mmol/L (ref 98–111)
Creatinine, Ser: 1.55 mg/dL — ABNORMAL HIGH (ref 0.44–1.00)
GFR, Estimated: 35 mL/min — ABNORMAL LOW (ref >60)
Glucose, Bld: 197 mg/dL — ABNORMAL HIGH (ref 70–99)
Potassium: 3.7 mmol/L (ref 3.5–5.1)
Sodium: 141 mmol/L (ref 135–145)
Total Bilirubin: 0.7 mg/dL (ref 0.3–1.2)
Total Protein: 5.9 g/dL — ABNORMAL LOW (ref 6.5–8.1)

## 2022-01-02 LAB — URINALYSIS, ROUTINE W REFLEX MICROSCOPIC
Bilirubin Urine: NEGATIVE
Glucose, UA: NEGATIVE mg/dL
Hgb urine dipstick: NEGATIVE
Ketones, ur: NEGATIVE mg/dL
Nitrite: NEGATIVE
Protein, ur: 30 mg/dL — AB
Specific Gravity, Urine: 1.025 (ref 1.005–1.030)
WBC, UA: >50 WBC/hpf — ABNORMAL HIGH (ref 0–5)
pH: 5 (ref 5.0–8.0)

## 2022-01-02 LAB — I-STAT CHEM 8, ED
BUN: 37 mg/dL — ABNORMAL HIGH (ref 8–23)
Calcium, Ion: 1.16 mmol/L (ref 1.15–1.40)
Chloride: 111 mmol/L (ref 98–111)
Creatinine, Ser: 1.5 mg/dL — ABNORMAL HIGH (ref 0.44–1.00)
Glucose, Bld: 192 mg/dL — ABNORMAL HIGH (ref 70–99)
HCT: 36 % (ref 36.0–46.0)
Hemoglobin: 12.2 g/dL (ref 12.0–15.0)
Potassium: 3.6 mmol/L (ref 3.5–5.1)
Sodium: 142 mmol/L (ref 135–145)
TCO2: 20 mmol/L — ABNORMAL LOW (ref 22–32)

## 2022-01-02 LAB — DIFFERENTIAL
Abs Immature Granulocytes: 0.02 10*3/uL (ref 0.00–0.07)
Basophils Absolute: 0 10*3/uL (ref 0.0–0.1)
Basophils Relative: 0 %
Eosinophils Absolute: 0 10*3/uL (ref 0.0–0.5)
Eosinophils Relative: 1 %
Immature Granulocytes: 0 %
Lymphocytes Relative: 17 %
Lymphs Abs: 1.1 10*3/uL (ref 0.7–4.0)
Monocytes Absolute: 0.4 10*3/uL (ref 0.1–1.0)
Monocytes Relative: 7 %
Neutro Abs: 4.5 10*3/uL (ref 1.7–7.7)
Neutrophils Relative %: 75 %

## 2022-01-02 LAB — APTT: aPTT: 27 seconds (ref 24–36)

## 2022-01-02 LAB — PROTIME-INR
INR: 1 (ref 0.8–1.2)
Prothrombin Time: 13.5 seconds (ref 11.4–15.2)

## 2022-01-02 LAB — ETHANOL: Alcohol, Ethyl (B): <10 mg/dL (ref <10)

## 2022-01-02 LAB — CBG MONITORING, ED: Glucose-Capillary: 175 mg/dL — ABNORMAL HIGH (ref 70–99)

## 2022-01-02 IMAGING — MR MR HEAD W/O CM
6 of 9 series · 28 of 48 positions shown · non-contrast
Comparison: Prior CT from earlier the same day.

CLINICAL DATA: Initial evaluation for neuro deficit, stroke
suspected.

EXAM:
MRI HEAD WITHOUT CONTRAST
TECHNIQUE: Multiplanar, multiecho pulse sequences of the brain and surrounding
structures were obtained without intravenous contrast.

[Series 2: DWI · axial · 3.0mm · 0.94mm/px · z∈[-83,+61]mm · 8 of 97 slices shown (1 of 2)]
[im 1/97]
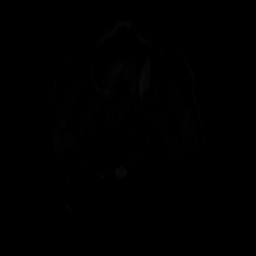
[im 11/97]
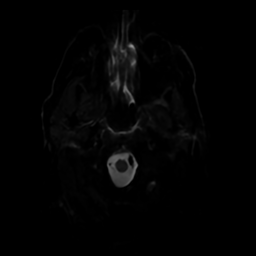
[im 33/97]
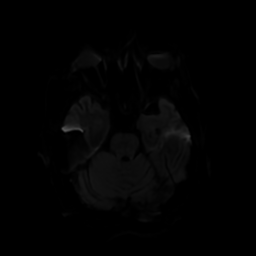
[im 43/97]
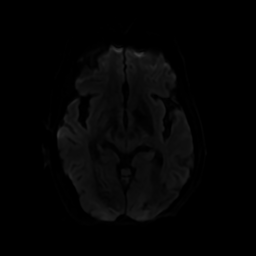
[im 54/97]
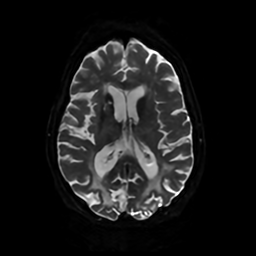
[im 65/97]
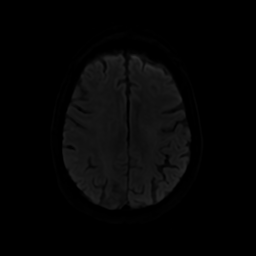
[im 86/97]
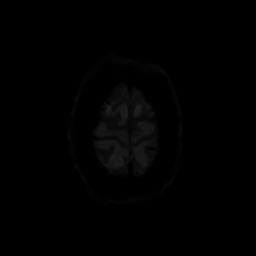
[im 97/97]
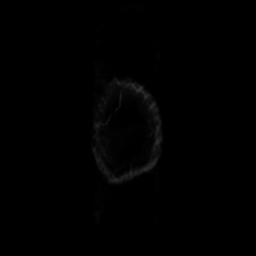

[Series 3: DWI · coronal · 4.0mm · 0.94mm/px · 7 of 72 slices shown (2 of 2)]
[im 1/72]
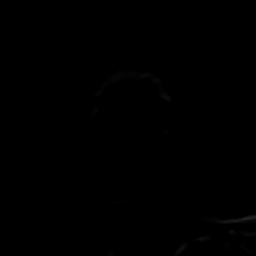
[im 12/72]
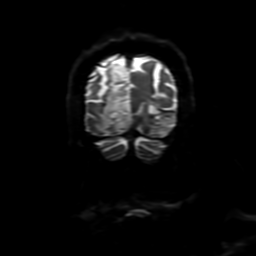
[im 24/72]
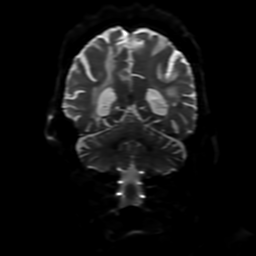
[im 36/72]
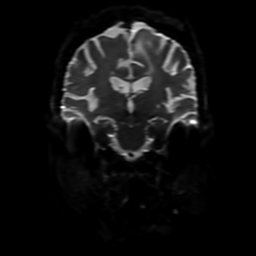
[im 48/72]
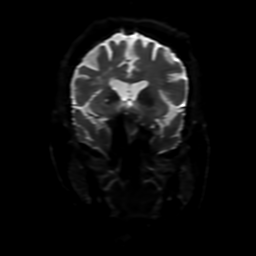
[im 60/72]
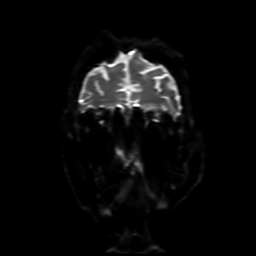
[im 72/72]
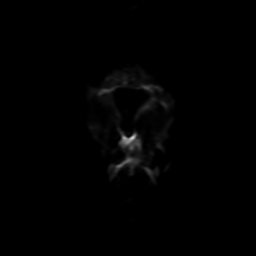

[Series 4: FLAIR · sagittal · 5.0mm · 0.23mm/px · 2 of 25 slices shown (1 of 2)]
[im 1/25]
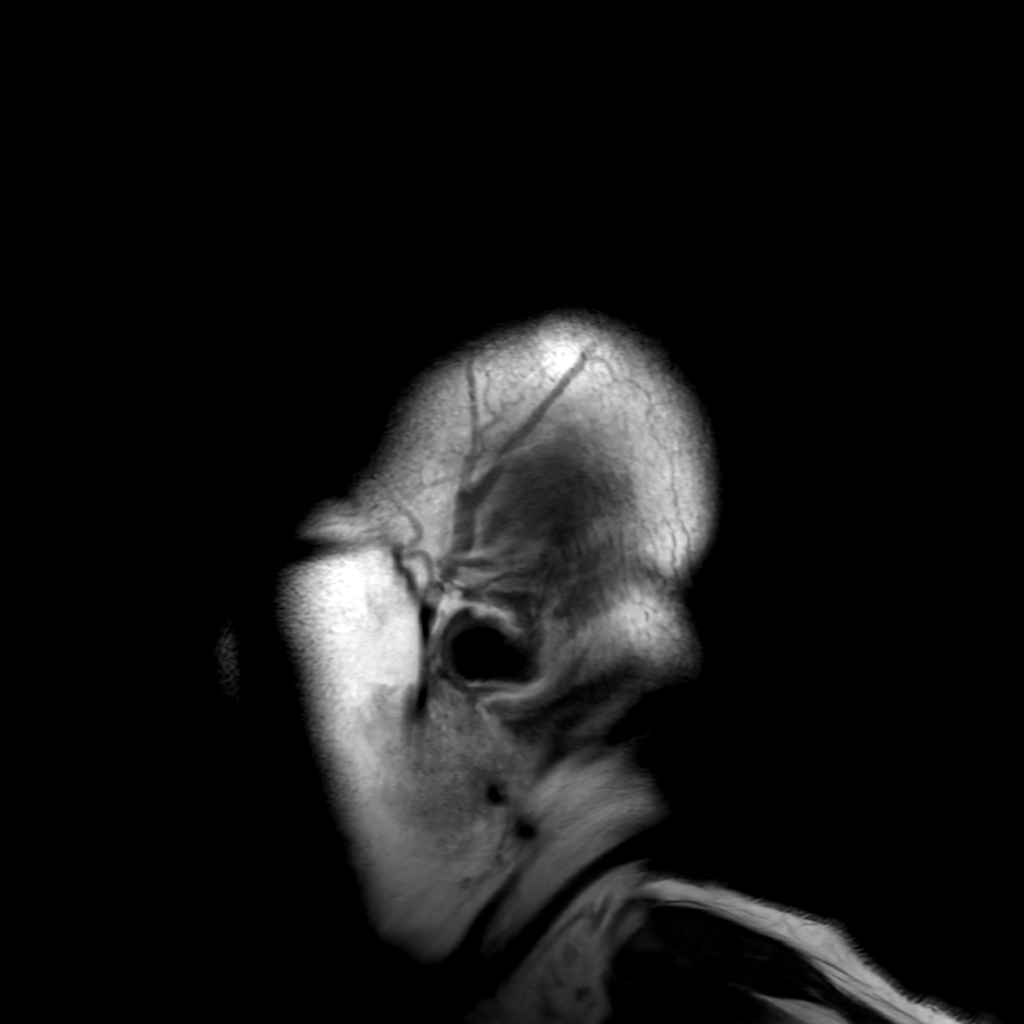
[im 25/25]
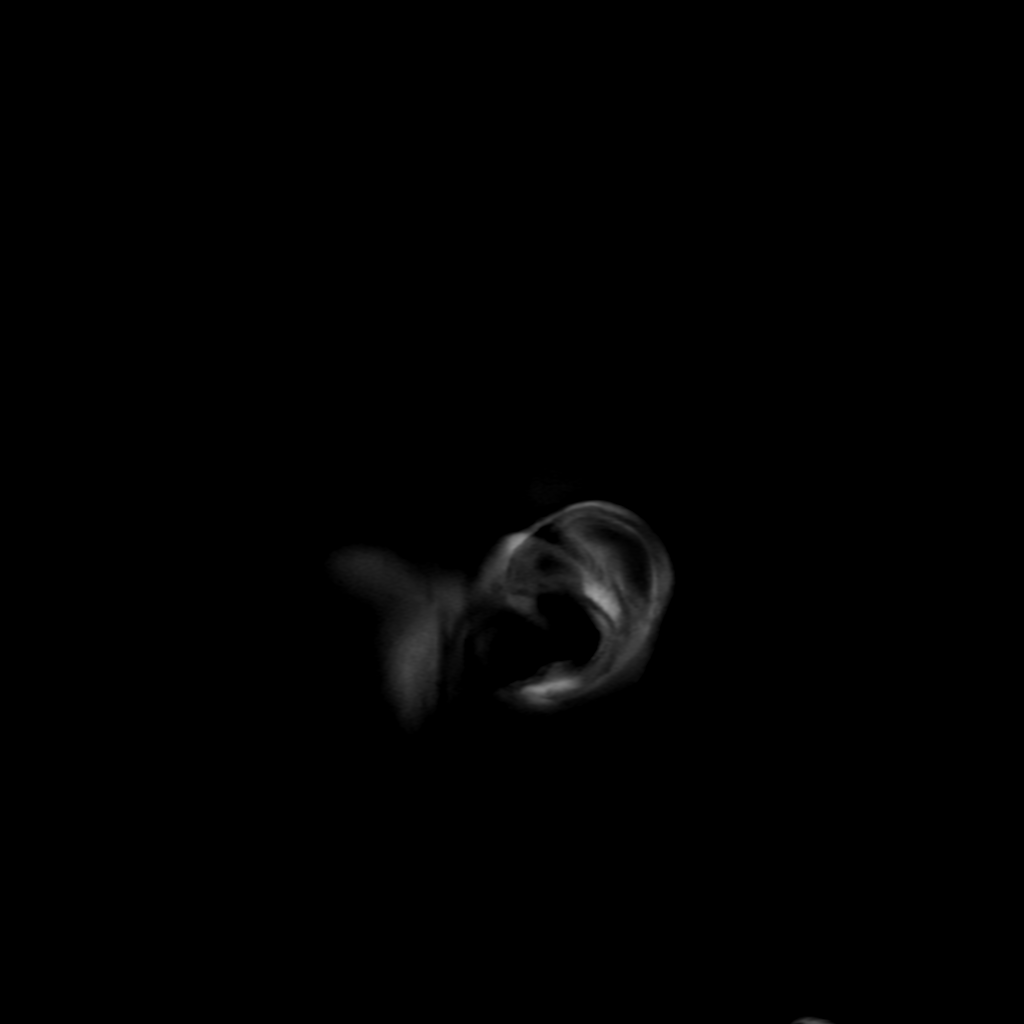

[Series 6: FLAIR · axial · 4.0mm · 0.45mm/px · z∈[-80,+65]mm · 3 of 35 slices shown (2 of 2)]
[im 1/35]
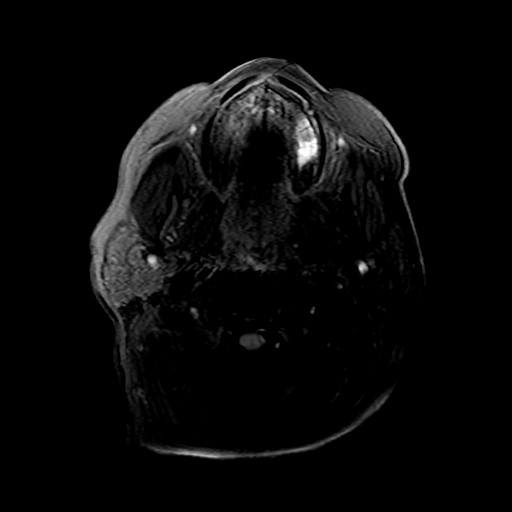
[im 18/35]
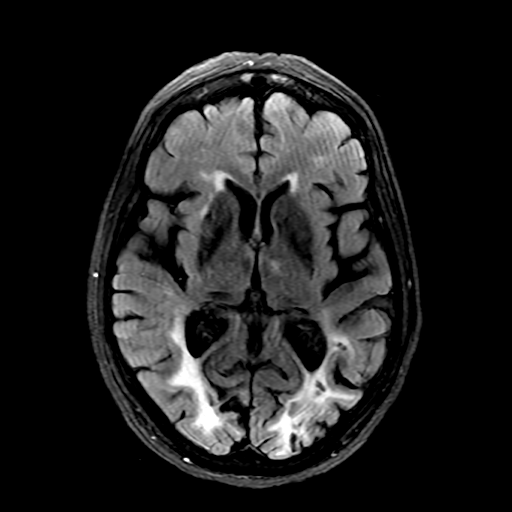
[im 35/35]
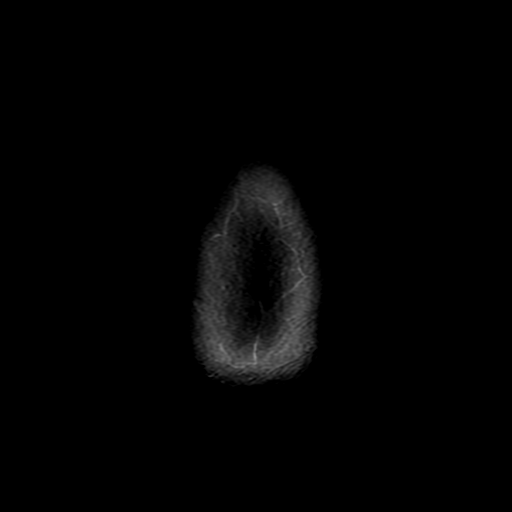

[Series 250: ADC · axial · 3.0mm · 0.94mm/px · z∈[-83,+61]mm · 5 of 48 slices shown (1 of 2)]
[im 1/48]
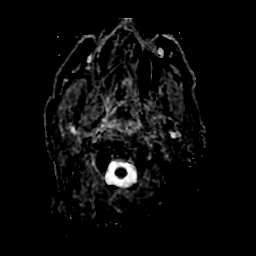
[im 12/48]
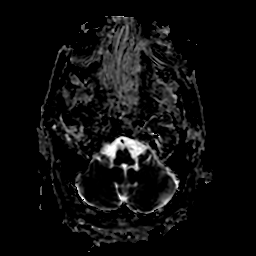
[im 24/48]
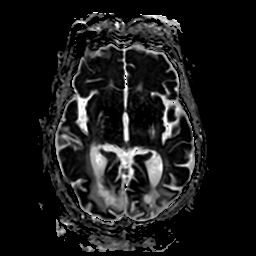
[im 36/48]
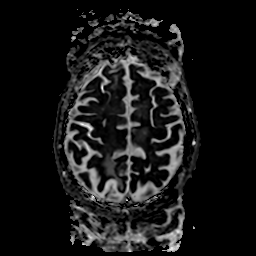
[im 48/48]
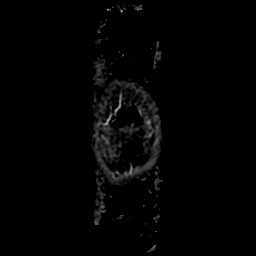

[Series 350: ADC · coronal · 4.0mm · 0.94mm/px · 3 of 36 slices shown (2 of 2)]
[im 1/36]
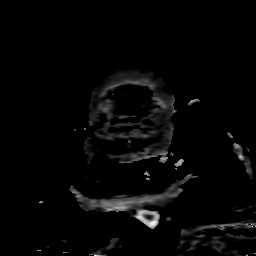
[im 18/36]
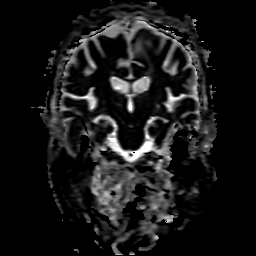
[im 36/36]
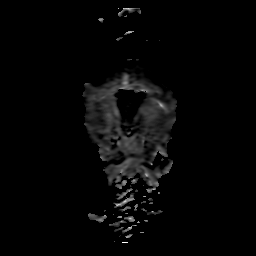

[28 of 48 positions shown; findings below may reference images not displayed]

FINDINGS: Brain: Generalized age-related cerebral atrophy. Patchy and
confluent T2/FLAIR hyperintensity involving the periventricular deep
white matter both cerebral hemispheres, most consistent with chronic
small vessel ischemic disease, moderately advanced in nature.
Associated scattered remote lacunar infarcts about the deep gray
nuclei. Multifocal areas of encephalomalacia and gliosis involving
the anterior right frontal lobe, parasagittal posterior left frontal
lobe, and bilateral parieto-occipital regions, consistent with
chronic ischemic infarcts. Few additional small remote bilateral
cerebellar infarcts. Scattered chronic blood products noted about a
few of these chronic ischemic changes.

No abnormal foci of restricted diffusion to suggest acute or
subacute ischemia. Gray-white matter differentiation maintained. No
other acute or chronic intracranial blood products.

No mass lesion, midline shift or mass effect. No hydrocephalus or
extra-axial fluid collection. Pituitary gland suprasellar region
within normal limits.

Vascular: Major intracranial vascular flow voids are maintained.

Skull and upper cervical spine: Craniocervical junction with normal
limits. Bone marrow signal intensity normal. No scalp soft tissue
abnormality.

Sinuses/Orbits: Prior bilateral ocular lens replacement. Paranasal
sinuses and mastoid air cells are largely clear.

Other: None.
IMPRESSION: 1. No acute intracranial abnormality.
2. Underlying age-related cerebral atrophy with moderate chronic
small vessel ischemic disease, with multiple remote ischemic
infarcts as above.

## 2022-01-02 IMAGING — CT CT HEAD CODE STROKE
4 series · 15 of 47 positions shown, 17 images · non-contrast
Comparison: Brain MRI [DATE]. head CT [DATE].

CLINICAL DATA: Code stroke. Provided history: Neuro deficit, acute,
stroke suspected.



[Series 3: head 5.0 st · axial · 0.43mm/px · z∈[-79,+41]mm · 7 of 33 slices shown, 9 images]
[im 5/33  brain]
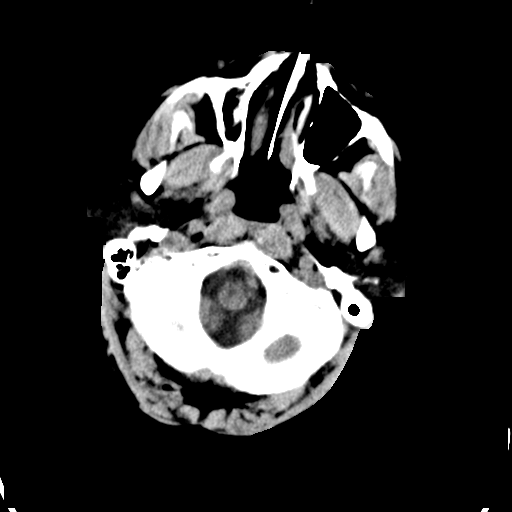
[im 5/33  bone]
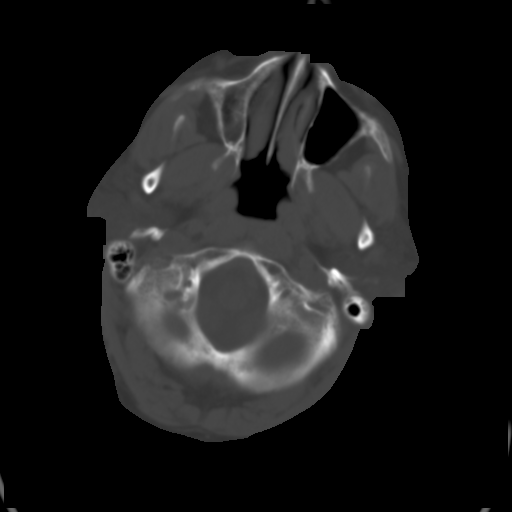
[im 9/33  brain]
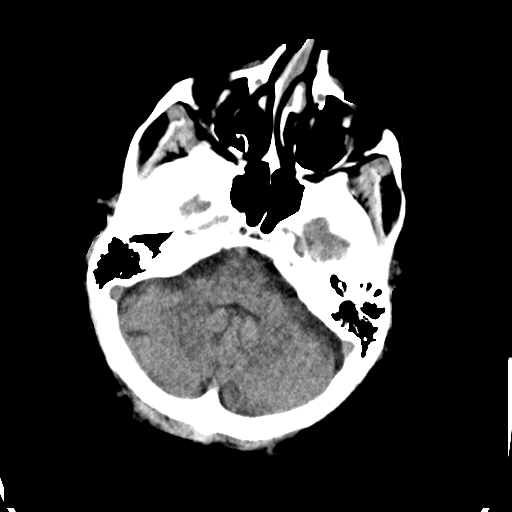
[im 13/33  brain]
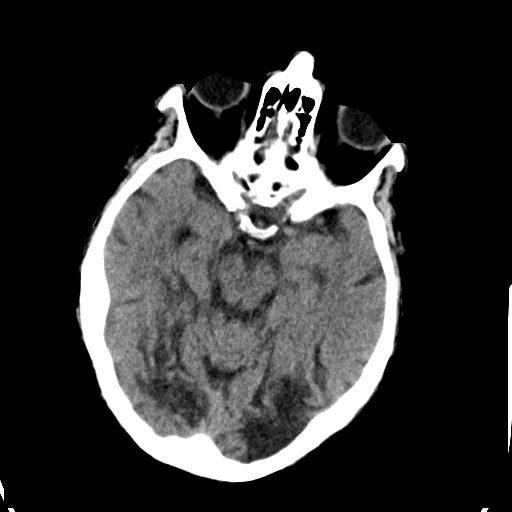
[im 17/33  brain]
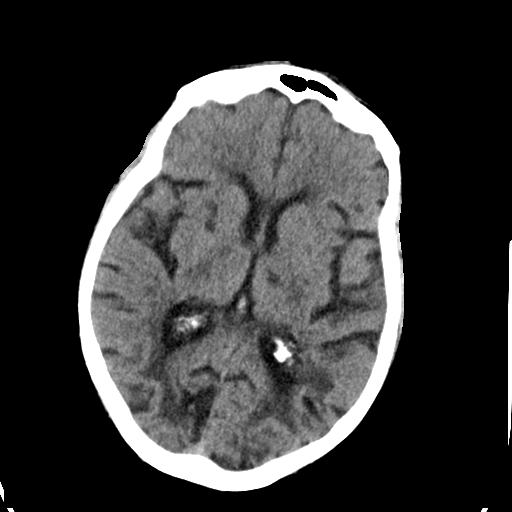
[im 21/33  brain]
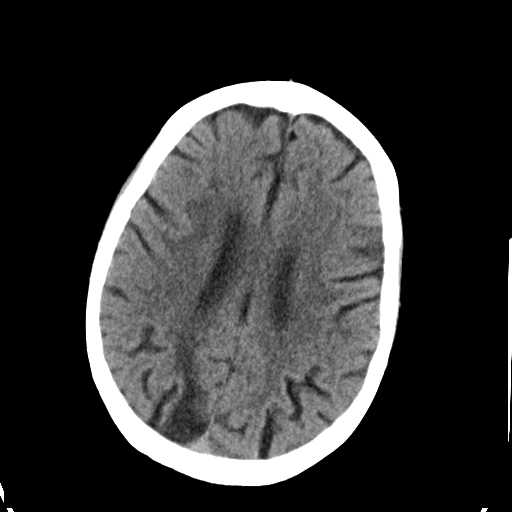
[im 21/33  bone]
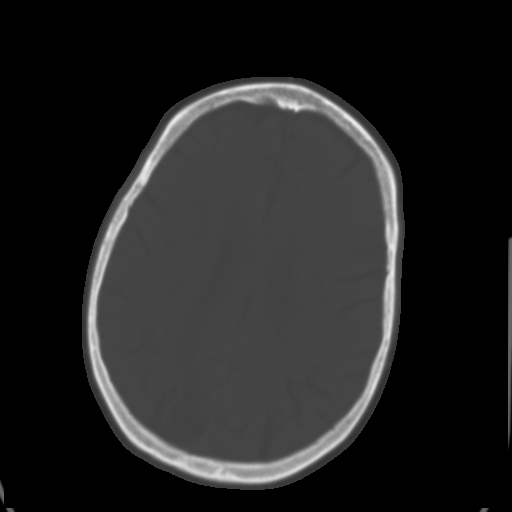
[im 25/33  brain]
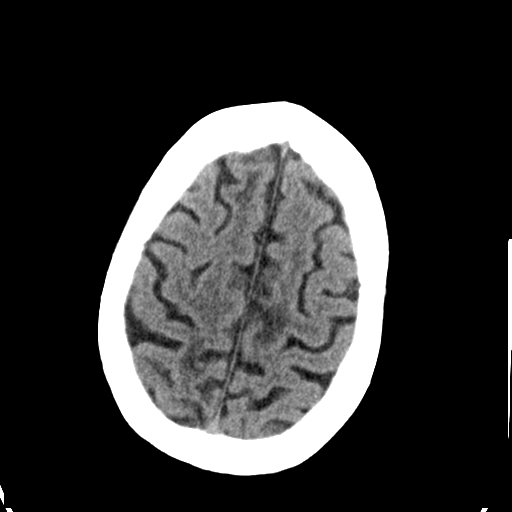
[im 29/33  brain]
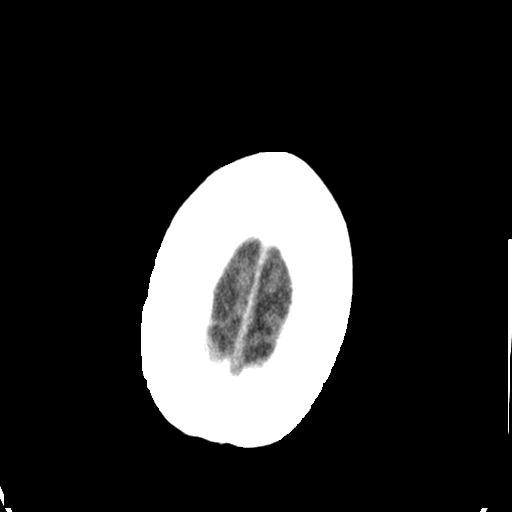

[Series 4: head 2.0 bone · axial · 0.43mm/px · z∈[-83,-67]mm · 2 of 83 slices shown]
[im 9/83  bone]
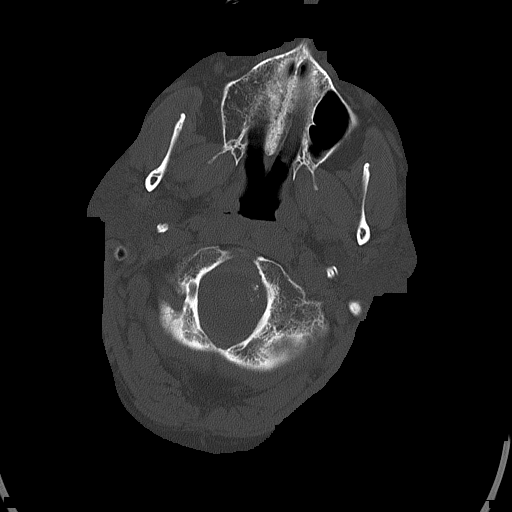
[im 17/83  bone]
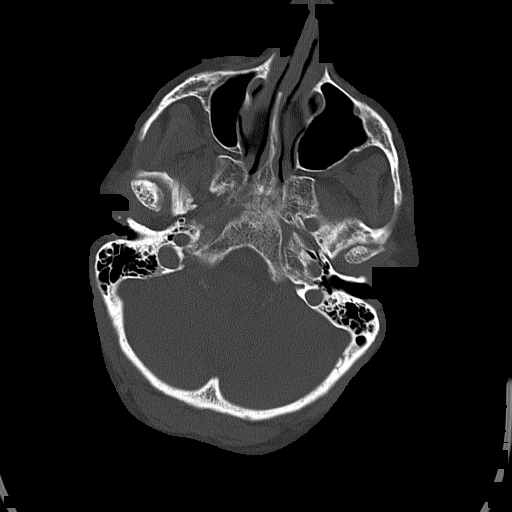

[Series 5: head 3.0 cor st · coronal · 0.31mm/px · 3 of 73 slices shown]
[im 25/73  brain]
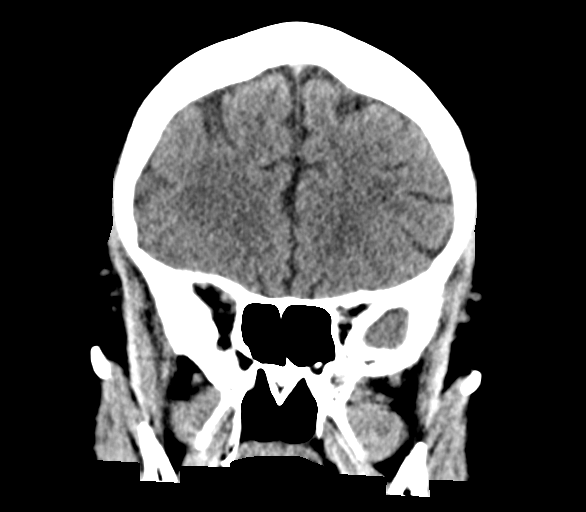
[im 33/73  brain]
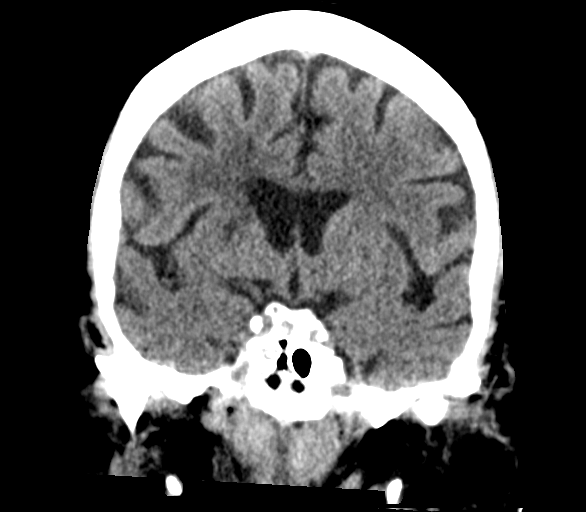
[im 41/73  brain]
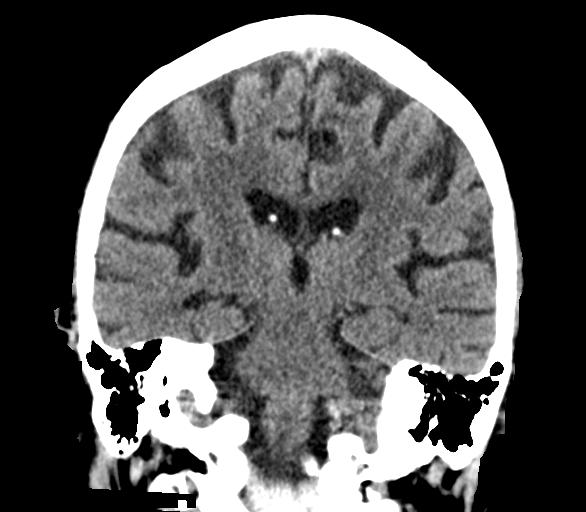

[Series 6: head 3.0 sag st · sagittal · 0.33mm/px · 3 of 62 slices shown]
[im 21/62  brain]
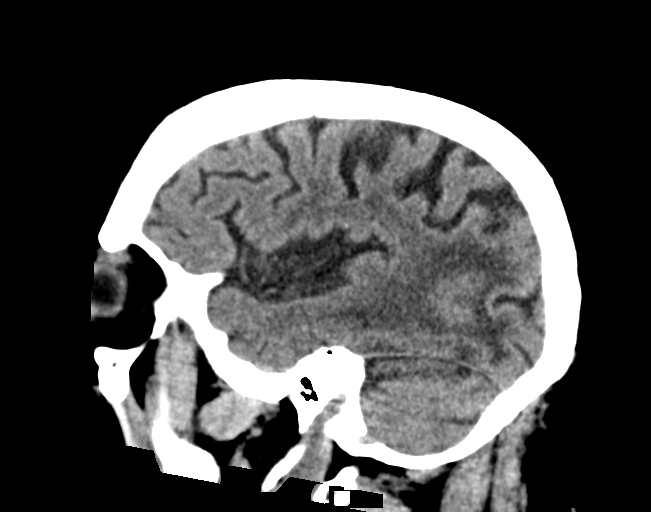
[im 31/62  brain]
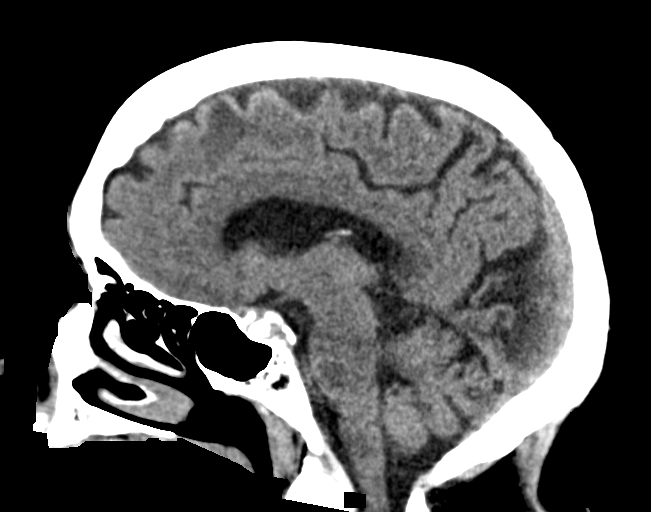
[im 41/62  brain]
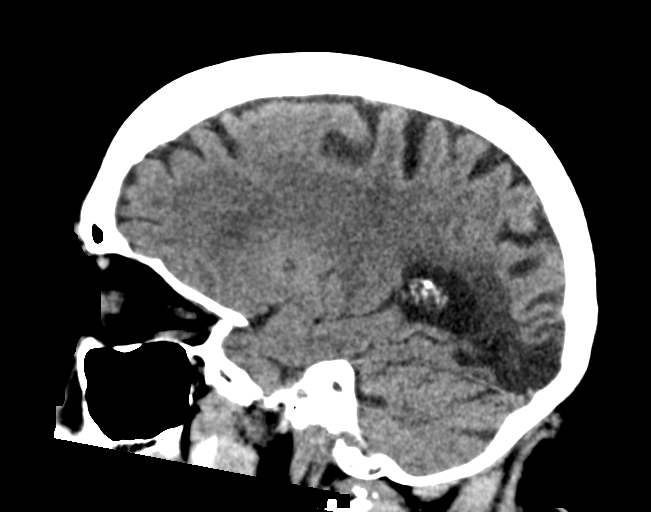

[15 of 47 positions shown; findings below may reference images not displayed]

FINDINGS: Brain:

Mild generalized parenchymal atrophy.

Redemonstrated chronic cortical/subcortical infarcts within the
posteromedial left frontal lobe and bilateral parietooccipital
lobes.

Chronic lacunar infarcts within the bilateral basal ganglia, thalami
and cerebellar hemispheres, some of which were better appreciated on
the prior brain MRI of [DATE].

Background mild patchy and ill-defined hypoattenuation within the
cerebral white matter, nonspecific but compatible with chronic small
vessel ischemic disease.

There is no acute intracranial hemorrhage.

No acute demarcated cortical infarct.

No extra-axial fluid collection.

No evidence of an intracranial mass.

No midline shift.

Vascular: No hyperdense vessel. Atherosclerotic calcifications.

Skull: No fracture or aggressive osseous lesion.

Sinuses/Orbits: No mass or acute finding within the imaged orbits.
Minimal mucosal thickening within the bilateral ethmoid and right
maxillary sinuses.

ASPECTS (Alberta Stroke Program Early CT Score)

- Ganglionic level infarction (caudate, lentiform nuclei, internal
capsule, insula, M1-M3 cortex): 7

- Supraganglionic infarction (M4-M6 cortex): 3

Total score (0-10 with 10 being normal): 10 (when discounting
chronic infarcts).

These results were communicated to Dr. TERRERO at [DATE] pmon
[DATE]by text page via the [employer] messaging system.
IMPRESSION: No evidence of acute intracranial abnormality.

Parenchymal atrophy, chronic small vessel ischemic disease and
multiple chronic infarcts as described and not appreciably changed
from the prior brain MRI of [DATE].

## 2022-01-02 MED ORDER — SODIUM CHLORIDE 0.9 % IV SOLN
1.0000 g | Freq: Once | INTRAVENOUS | Status: AC
  Administered 2022-01-03: 1 g via INTRAVENOUS
  Filled 2022-01-02: qty 10

## 2022-01-02 MED ORDER — LACTATED RINGERS IV SOLN: INTRAVENOUS | Status: DC

## 2022-01-02 MED ORDER — ENOXAPARIN SODIUM 40 MG/0.4ML IJ SOSY
40.0000 mg | PREFILLED_SYRINGE | INTRAMUSCULAR | Status: DC
  Administered 2022-01-03: 40 mg via SUBCUTANEOUS
  Filled 2022-01-02: qty 0.4

## 2022-01-02 MED ORDER — ACETAMINOPHEN 325 MG PO TABS
650.0000 mg | ORAL_TABLET | Freq: Four times a day (QID) | ORAL | Status: DC | PRN
Start: 2022-01-02 — End: 2022-01-03

## 2022-01-02 MED ORDER — SODIUM CHLORIDE 0.9 % IV BOLUS
1000.0000 mL | Freq: Once | INTRAVENOUS | Status: AC
  Administered 2022-01-03: 1000 mL via INTRAVENOUS

## 2022-01-02 MED ORDER — INSULIN ASPART 100 UNIT/ML IJ SOLN: 0.0000 [IU] | Freq: Every day | INTRAMUSCULAR | Status: DC

## 2022-01-02 MED ORDER — INSULIN ASPART 100 UNIT/ML IJ SOLN: 0.0000 [IU] | Freq: Three times a day (TID) | INTRAMUSCULAR | Status: DC

## 2022-01-02 MED ORDER — SODIUM CHLORIDE 0.9% FLUSH
3.0000 mL | Freq: Once | INTRAVENOUS | Status: AC
  Administered 2022-01-02: 3 mL via INTRAVENOUS

## 2022-01-02 NOTE — ED Notes (Signed)
Patient still in MRI at this time. Granddaughter waiting in room for the patient's return. Granddaughter and daughter updated via phone and in person at this time and both verbalize understanding of plan of care up to this point.

## 2022-01-02 NOTE — ED Notes (Signed)
Patient reports that she lives with her daughter and granddaughter. Patient reports that she uses a walker at home to get around and preform ADLs

## 2022-01-02 NOTE — Consult Note (Addendum)
NEURO HOSPITALIST CONSULT NOTE   Requestig physician: Dr. Audley Hose  Reason for Consult: New onset of visual hallucinations, dysarthria and diffuse limb weakness  History obtained from:  EMS,  Patient and Chart     HPI:                                                                                                                                          Tara [name] is an 76 y.o. 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 Granddaughter, who she lives with, had called EMS after she noted patient unable to ambulate since about 5:40 PM on Wednesday. The patient was taken to CT which showed multiple old ischemic infarctions, most notably involving the posterior occipital lobes bilaterally, but no acute abnormality.  On evaluation by Neurology, she endorsed having new onset hallucinations starting this morning. The hallucinations were colorless, formed, of people and animals. There was an auditory component of nonsensical speech by the hallucinated people. The patient gives vague/noncommittal responses when asked if she thought they were real, but states that they were non-threatening.   Family came to the ED and reported the additional recent history, as documented by Dr. Margo Aye: "Per family at bedside she has been progressively confused for the past 2 to 3 days with associated poor oral intake and poor appetite.  No subjective fevers reported.  No GI symptoms reported.  No seizure-like activities."  U/A in the ED was positive for pyuria. Her renal function labs have significantly worsened since prior labs listed in Epic. MRI of brain and cervical spine revealed no acute lesions, with multiple old strokes noted.   Past Medical History:  Diagnosis Date   Anxiety     Arthritis    Barrett's syndrome    Carpal tunnel syndrome    Depression    GERD (gastroesophageal reflux disease)    Heart murmur    Hyperlipidemia    Hypertension    Myocardial infarction [facility])    08   PAD (peripheral artery disease) (HCC)    Stroke [facility])     Past Surgical History:  Procedure Laterality Date   ABDOMINAL AORTOGRAM W/LOWER EXTREMITY N/A 07/28/2019   Procedure: ABDOMINAL AORTOGRAM W/LOWER EXTREMITY;  Surgeon: Iran Ouch, MD;  Location: [facility] INVASIVE CV LAB;  Service: Cardiovascular;  Laterality: N/A;   ARTERY BIOPSY Right 07/12/2019   Procedure: BIOPSY TEMPORAL ARTERY;  Surgeon: Henrene Dodge, MD;  Location: [facility] ORS;  Service: General;  Laterality: Right;   BREAST BIOPSY Left 2007   Neg- [place]   CARDIAC CATHETERIZATION     CARDIAC VALVE  REPLACEMENT     CHOLECYSTECTOMY     COLON SURGERY     COLONOSCOPY WITH PROPOFOL N/A 04/09/2019   Procedure: COLONOSCOPY WITH PROPOFOL;  Surgeon: Pasty Spillers, MD;  Location: [facility] ENDOSCOPY;  Service: Endoscopy;  Laterality: N/A;   CORONARY ANGIOPLASTY WITH STENT PLACEMENT  2008   [place]; [facility] Stent placement x 1    DENTAL SURGERY     ESOPHAGOGASTRODUODENOSCOPY (EGD) WITH PROPOFOL N/A 04/09/2019   Procedure: ESOPHAGOGASTRODUODENOSCOPY (EGD) WITH PROPOFOL;  Surgeon: Pasty Spillers, MD;  Location: [facility] ENDOSCOPY;  Service: Endoscopy;  Laterality: N/A;   EXCISIONAL HEMORRHOIDECTOMY     EYE SURGERY     FEMORAL-POPLITEAL BYPASS GRAFT Right 08/02/2019   Procedure: BYPASS GRAFT FEMORAL-POPLITEAL ARTERY;  Surgeon: Larina Earthly, MD;  Location: [facility] OR;  Service: Vascular;  Laterality: Right;   HERNIA REPAIR     LOWER EXTREMITY ANGIOGRAPHY Left 11/11/2019   Procedure: LOWER EXTREMITY ANGIOGRAPHY;  Surgeon: Annice Needy, MD;  Location: [facility] INVASIVE CV LAB;  Service: Cardiovascular;  Laterality: Left;   MULTIPLE TOOTH EXTRACTIONS     TONSILLECTOMY     TOTAL ABDOMINAL HYSTERECTOMY      Family  History  Adopted: Yes  Problem Relation Age of Onset   Breast cancer Mother 67            Social History:  reports that she has been smoking cigarettes. She has a 0.75 pack-year smoking history. She has never used smokeless tobacco. She reports that she does not drink alcohol and does not use drugs.  No Known Allergies  HOME MEDICATIONS:                                                                                                                      No current facility-administered medications on file prior to encounter.   Current Outpatient Medications on File Prior to Encounter  Medication Sig Dispense Refill   albuterol (VENTOLIN HFA) 108 (90 Base) MCG/ACT inhaler Inhale 2 puffs into the lungs every 6 (six) hours as needed for wheezing or shortness of breath. 18 g 4   amLODipine (NORVASC) 10 MG tablet Take 1 tablet (10 mg total) by mouth daily. 90 tablet 1   aspirin EC 81 MG tablet Take 81 mg by mouth daily. Swallow whole.     B-D TB SYRINGE 1CC/27GX1/2" 27G X 1/2" 1 ML MISC USE AS DIRECTED EVERY 30 DAYS     baclofen (LIORESAL) 10 MG tablet Take by mouth.     citalopram (CELEXA) 20 MG tablet Take 1.5 tablets (30 mg total) by mouth 2 (two) times daily. 270 tablet 1   clopidogrel (PLAVIX) 75 MG tablet Take 1 tablet (75 mg total) by mouth daily. 30 tablet 11   cyanocobalamin (,VITAMIN B-12,) 1000 MCG/ML injection Inject 1 mL (1,000 mcg total) into the muscle every 30 (thirty) days. 1 mL 12   diclofenac Sodium (VOLTAREN) 1 % GEL Apply 4 g topically 4 (four) times daily.     diphenoxylate-atropine (  LOMOTIL) 2.5-0.025 MG tablet Take 1 tablet by mouth 4 (four) times daily as needed for diarrhea or loose stools. 120 tablet 0   donepezil (ARICEPT) 5 MG tablet Take 5 mg by mouth daily.     fenofibrate micronized (LOFIBRA) 200 MG capsule TAKE 1 CAPSULE(200 MG) BY MOUTH DAILY 90 capsule 1   fluticasone (FLONASE) 50 MCG/ACT nasal spray Place 1-2 sprays into both nostrils daily.     Insulin  Syringe-Needle U-100 (INSULIN SYRINGE 1CC/30GX5/16") 30G X 5/16" 1 ML MISC 1 each by Does not apply route every 30 (thirty) days. 100 each 12   isosorbide mononitrate (IMDUR) 60 MG 24 hr tablet Take 1 tablet (60 mg total) by mouth in the morning and at bedtime. 180 tablet 1   lidocaine (LIDODERM) 5 % Place 2 patches onto the skin daily. 15 patch 0   lisinopril (ZESTRIL) 40 MG tablet Take 1 tablet (40 mg total) by mouth 2 (two) times daily. 180 tablet 1   loperamide (IMODIUM) 2 MG capsule Take 2 mg by mouth as needed for diarrhea or loose stools.     magic mouthwash (nystatin, lidocaine, diphenhydrAMINE, alum & mag hydroxide) suspension Swish and spit 5 mLs 3 (three) times daily as needed for mouth pain. 180 mL 1   montelukast (SINGULAIR) 10 MG tablet Take 10 mg by mouth daily.     naproxen (NAPROSYN) 375 MG tablet Take 1 tablet (375 mg total) by mouth 2 (two) times daily as needed for mild pain or moderate pain.     neomycin-bacitracin-polymyxin (NEOSPORIN) OINT Apply 1 application topically as needed for wound care.     nitroGLYCERIN (NITROSTAT) 0.4 MG SL tablet Place 1 tablet (0.4 mg total) under the tongue every 5 (five) minutes as needed for chest pain. 25 tablet 3   nortriptyline (PAMELOR) 50 MG capsule Take 1 capsule (50 mg total) by mouth at bedtime. 90 capsule 1   omeprazole (PRILOSEC) 40 MG capsule Take 1 capsule (40 mg total) by mouth 2 (two) times daily. TAKE 1 CAPSULE BY MOUTH TWICE DAILY 180 capsule 1   pregabalin (LYRICA) 150 MG capsule Take 1 capsule (150 mg total) by mouth 2 (two) times daily. 180 capsule 1   rosuvastatin (CRESTOR) 40 MG tablet Take 1 tablet (40 mg total) by mouth daily. 90 tablet 3   traZODone (DESYREL) 100 MG tablet Take 1 tablet (100 mg total) by mouth at bedtime. Take along with one 50 mg tablet for total 150 mg at bedtime 90 tablet 1   traZODone (DESYREL) 50 MG tablet Take 1 tablet (50 mg total) by mouth at bedtime. Take along with one 100 mg tablet for total 150  mg at bedtime 90 tablet 1     ROS:  Denies CP, new vision changes from her baseline visual impairment, or SOB. Denies unilateral weakness, stating that she is equally weak all over. Other ROS as per HPI.    Blood pressure (!) 103/52, pulse 64, temperature (!) 97.3 F (36.3 C), temperature source Oral, resp. rate 10, height  (1.651 m), weight 96.4 kg, SpO2 94 %.   General Examination:                                                                                                       Physical Exam  HEENT-  [place]/AT. Poorly hydrated oral mucosa. No neck stiffness.  Lungs- Respirations unlabored Extremities- Warm and well perfused   Neurological Examination Mental Status: Awake with mild to moderately decreased level of alertness. Not oriented to the city, day of the week ("Tuesday"), month or year ("2003"). Is able to correctly identify the state. Only able to give vague details of her reason for presenting, such as "I was weak all over", with inability to provide details/specifics. Speech is dysarthric. Repetition is impaired. Able to name a pinky finger, thumb, her chin and her nose. At times her utterances include phonemic paraphasias. Speech is sparse but fluent.  Cranial Nerves: II: Right pupil slightly irregular at 4 mm and reactive. Left pupil 3 mm and reactive. Able to perceive moving objects in temporal visual fields bilaterally. Able to identify objects visually in her central vision. Has baseline central visual impairment from prior bilateral occipital lobe strokes.   III,IV, VI: EOM are full, but with saccadic visual pursuits. No nystagmus.  V: Temp sensation equal bilaterally VII: Mild left facial droop.  VIII: Hearing intact to voice IX,X: No hypophonia or hoarseness XI: Symmetric shoulder shrug XII: Midline tongue extension Motor: BUE:  4/5 bilateral upper extremities, proximally and distally RLE 3/5 strength proximally and distally with suggestion of overlapping poor effort.  LLE 2-3/5 strength proximally and distally with suggestion of overlapping poor effort.  Does spontaneously move legs along mattress of her stretcher when changing positions.  With arms held antigravity, there is prominent asterixis. No pronator drift.  Decreased tone in all 4 extremities.  Sensory: Temp and FT intact to BUE; no extinction to DSS.  Decreased FT sensation to bilateral thighs; does sense pressure. Severely decreased FT and pressure sensation to bilateral lower legs below knees; almost insensate to pressure applied to feet; hypersensitivity to cold stimuli bilateral thighs and legs.  Deep Tendon Reflexes: 1+ bilateral brachioradialis and biceps. Trace bilateral patellae. 0 bilateral achilles. Toes are mute bilaterally.  Cerebellar: No ataxia with FNF bilaterally in the context of asterixis. Unable to test H-S bilaterally due to weakness.  Gait: Unable to assess   Lab Results: Basic Metabolic Panel: Recent Labs  Lab January 13, 2022 1931 2022-01-13 1937  NA 141 142  K 3.7 3.6  CL 111 111  CO2 22  --   GLUCOSE 197* 192*  BUN 38* 37*  CREATININE 1.55* 1.50*  CALCIUM 9.0  --     CBC: Recent Labs  Lab 01-13-2022 1931 13-Jan-2022 1937  WBC 6.0  --  NEUTROABS 4.5  --   HGB 11.3* 12.2  HCT 37.8 36.0  MCV 84.2  --   PLT 181  --     Cardiac Enzymes: No results for input(s): "CKTOTAL", "CKMB", "CKMBINDEX", "TROPONINI" in the last 168 hours.  Lipid Panel: No results for input(s): "CHOL", "TRIG", "HDL", "CHOLHDL", "VLDL", "LDLCALC" in the last 168 hours.  Imaging: CT HEAD CODE STROKE WO CONTRAST  Result Date: 01-25-22 CLINICAL DATA:  Code stroke. Provided history: Neuro deficit, acute, stroke suspected. EXAM: CT HEAD WITHOUT CONTRAST TECHNIQUE: Contiguous axial images were obtained from the base of the skull through the vertex without  intravenous contrast. RADIATION DOSE REDUCTION: This exam was performed according to the departmental dose-optimization program which includes automated exposure control, adjustment of the mA and/or kV according to patient size and/or use of iterative reconstruction technique. COMPARISON:  Brain MRI 01/07/2021. head CT 11/02/2021. FINDINGS: Brain: Mild generalized parenchymal atrophy. Redemonstrated chronic cortical/subcortical infarcts within the posteromedial left frontal lobe and bilateral parietooccipital lobes. Chronic lacunar infarcts within the bilateral basal ganglia, thalami and cerebellar hemispheres, some of which were better appreciated on the prior brain MRI of 01/07/2021. Background mild patchy and ill-defined hypoattenuation within the cerebral white matter, nonspecific but compatible with chronic small vessel ischemic disease. There is no acute intracranial hemorrhage. No acute demarcated cortical infarct. No extra-axial fluid collection. No evidence of an intracranial mass. No midline shift. Vascular: No hyperdense vessel. Atherosclerotic calcifications. Skull: No fracture or aggressive osseous lesion. Sinuses/Orbits: No mass or acute finding within the imaged orbits. Minimal mucosal thickening within the bilateral ethmoid and right maxillary sinuses. ASPECTS [facility] Stroke Program Early CT Score) - Ganglionic level infarction (caudate, lentiform nuclei, internal capsule, insula, M1-M3 cortex): 7 - Supraganglionic infarction (M4-M6 cortex): 3 Total score (0-10 with 10 being normal): 10 (when discounting chronic infarcts). These results were communicated to Dr. Otelia Limes at 8:01 pmon 07/07/2023by text page via the [facility] messaging system. IMPRESSION: No evidence of acute intracranial abnormality. Parenchymal atrophy, chronic small vessel ischemic disease and multiple chronic infarcts as described and not appreciably changed from the prior brain MRI of 01/07/2021. Electronically Signed   By: Jackey Loge  D.O.   On: 01/25/2022 20:02     Assessment: 76 year old female with an extensive PMHx including prior occipital lobe strokes, CAD and PVD who presents to the ED with a 2-3 day history of progressively worsening confusion, culminating in patient being unable to ambulate, precipitating a call to EMS and evaluation in the ED.  1. Exam with diffuse weakness and decreased tone of all 4 extremities, BLE worse than BUE. Some asymmetry to lower extremity exam with LLE weaker than right. Also with confusion, phonemic paraphasias, disorientation and asterixis. Also with chronic cortical vision loss due to prior occipital lobe strokes. Multiple findings do not localize to a single anatomical site along the neuraxis. DDx includes embolic strokes, toxic/metabolic encephalopathy, intercurrent illness with severe weakness and confusion, seizure with postictal state and multifocal lesions along the neuraxis including possible transverse myelitis. Given BLE hyporeflexia with weakness and sensory loss, AIDP is also possible, but does not explain her AMS; peripheral neuropathy in conjunction with intercurrent illness and deconditioning are felt to be more likely explanations for her new onset of BLE weakness. Spinal cord stroke is felt to be unlikely due to some preserved strength on exam.  2. CT head: Mild generalized parenchymal atrophy. Redemonstrated chronic cortical/subcortical infarcts within the posteromedial left frontal lobe and bilateral parietooccipital lobes. Chronic lacunar infarcts within the bilateral  basal ganglia, thalami and cerebellar hemispheres, some of which were better appreciated on the prior brain MRI of 01/07/2021. Background mild patchy and ill-defined hypoattenuation within the cerebral white matter, nonspecific but compatible with chronic small vessel ischemic disease. 3. STAT MRI of brain: No acute intracranial abnormality. Underlying age-related cerebral atrophy with moderate chronic small vessel  ischemic disease, with multiple remote ischemic infarcts. 4. STAT MRI of cervical spine: No acute abnormality within the cervical spine. Multilevel cervical spondylosis with resultant mild to moderate diffuse spinal stenosis at C3-4 through C6-7. Multifactorial degenerative changes with resultant multilevel foraminal narrowing as above. Notable findings include moderate right C4 and C5 foraminal stenosis, severe right with moderate left C6 foraminal narrowing, with moderate right worse than left C7 foraminal stenosis.  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 Preserved flow void within the dominant left vertebral artery.  Recommendations:  1. STAT MRI of thoracic and lumbar spine to further assess her diffuse BLE weakness (ordered)  2. EEG 3. Further recommendations pending MRI to thoracic and lumbar spine.   Addendum: - MRI lumbar spine:  1. Severely limited exam due to the patient's inability to tolerate the full length of the exam and extensive motion. 2. No definite acute abnormality. Grossly stable appearance of multilevel degenerative spondylosis with resultant mild to moderate spinal stenosis at L2-3 and L3-4. 3. 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 - MRI thoracic spine: 1. Technically limited exam due to the patient's inability to tolerate the full length of the study and motion artifact. 2. Grossly normal MRI appearance of the thoracic spinal cord. 3. Small disc protrusions at T5-6 through T10-11 as above. No significant spinal stenosis. - STAT VBG negative for CO2 retention - Given no acute brain or spinal cord lesions on MRI to explain her presentation, multifactorial etiology is felt to be more likely, with her pyuria suggesting UTI and hypovolemian being consistent with her reduced PO intake over several days. She has been started on Rocephin.   - Agree with holding her  psychotropic medications, including pregabalin (Lyrica). Assess if holding the latter results in resolution of her asterixis - EEG completed with report pending.  - Expedited thiamine level (ordered). After it has been drawn, start high dose IV thiamine at 500 mg TID x 3 days.    Electronically signed: Dr. Caryl Pina 01/06/2022, 10:41 PM

## 2022-01-02 NOTE — ED Notes (Signed)
Transported to CT at this time. 

## 2022-01-02 NOTE — ED Notes (Signed)
This RN spoke with MRI via phone call at this time

## 2022-01-02 NOTE — ED Notes (Signed)
Patient is back from MRI at this time.

## 2022-01-02 NOTE — ED Provider Notes (Signed)
[facility] EMERGENCY DEPARTMENT Provider Note   CSN: [id number] Arrival date & time: 01/22/22  1929  An emergency department physician performed an initial assessment on this suspected stroke patient at 20.  History  Chief Complaint  Patient presents with   Weakness    Tashanna Dolin Hitzeman is a 76 y.o. female.  Patient presents to ER chief complaint of confusion and difficulty speaking difficulty walking.  Per granddaughter who the patient lives with, she has been becoming more confused for the past 2 to 3 days.  She was reportedly acutely worse and unable to walk around 5:40 PM today.  No reports of fevers or cough or vomiting or diarrhea.       Home Medications Prior to Admission medications   Medication Sig Start Date End Date Taking? Authorizing Provider  albuterol (VENTOLIN HFA) 108 (90 Base) MCG/ACT inhaler Inhale 2 puffs into the lungs every 6 (six) hours as needed for wheezing or shortness of breath. 08/19/21   Johnson, Megan P, DO  amLODipine (NORVASC) 10 MG tablet Take 1 tablet (10 mg total) by mouth daily. 08/19/21   Olevia Perches P, DO  aspirin EC 81 MG tablet Take 81 mg by mouth daily. Swallow whole.    [provider]  B-D TB SYRINGE 1CC/27GX1/2" 27G X 1/2" 1 ML MISC USE AS DIRECTED EVERY 30 DAYS 11/06/19   [provider]  baclofen (LIORESAL) 10 MG tablet Take by mouth. 06/30/21   [provider]  citalopram (CELEXA) 20 MG tablet Take 1.5 tablets (30 mg total) by mouth 2 (two) times daily. 10/08/21   Olevia Perches P, DO  clopidogrel (PLAVIX) 75 MG tablet Take 1 tablet (75 mg total) by mouth daily. 11/11/19   Annice Needy, MD  cyanocobalamin (,VITAMIN B-12,) 1000 MCG/ML injection Inject 1 mL (1,000 mcg total) into the muscle every 30 (thirty) days. 02/09/21   Olevia Perches P, DO  diclofenac Sodium (VOLTAREN) 1 % GEL Apply 4 g topically 4 (four) times daily. 01/12/21   Rodolph Bong, MD  diphenoxylate-atropine (LOMOTIL)  2.5-0.025 MG tablet Take 1 tablet by mouth 4 (four) times daily as needed for diarrhea or loose stools. 08/14/21   Johnson, Megan P, DO  donepezil (ARICEPT) 5 MG tablet Take 5 mg by mouth daily. 01/01/21   [provider]  fenofibrate micronized (LOFIBRA) 200 MG capsule TAKE 1 CAPSULE(200 MG) BY MOUTH DAILY 08/19/21   Johnson, Megan P, DO  fluticasone (FLONASE) 50 MCG/ACT nasal spray Place 1-2 sprays into both nostrils daily. 12/12/20   [provider]  Insulin Syringe-Needle U-100 (INSULIN SYRINGE 1CC/30GX5/16") 30G X 5/16" 1 ML MISC 1 each by Does not apply route every 30 (thirty) days. 02/09/21   Olevia Perches P, DO  isosorbide mononitrate (IMDUR) 60 MG 24 hr tablet Take 1 tablet (60 mg total) by mouth in the morning and at bedtime. 08/19/21   Johnson, Megan P, DO  lidocaine (LIDODERM) 5 % Place 2 patches onto the skin daily. 01/12/21   Rodolph Bong, MD  lisinopril (ZESTRIL) 40 MG tablet Take 1 tablet (40 mg total) by mouth 2 (two) times daily. 10/08/21   Olevia Perches P, DO  loperamide (IMODIUM) 2 MG capsule Take 2 mg by mouth as needed for diarrhea or loose stools.    [provider]  magic mouthwash (nystatin, lidocaine, diphenhydrAMINE, alum & mag hydroxide) suspension Swish and spit 5 mLs 3 (three) times daily as needed for mouth pain. 10/08/21   Dorcas Carrow,  DO  montelukast (SINGULAIR) 10 MG tablet Take 10 mg by mouth daily. 12/12/20   [provider]  naproxen (NAPROSYN) 375 MG tablet Take 1 tablet (375 mg total) by mouth 2 (two) times daily as needed for mild pain or moderate pain. 01/12/21   Rodolph Bong, MD  neomycin-bacitracin-polymyxin (NEOSPORIN) OINT Apply 1 application topically as needed for wound care. 01/12/21   Rodolph Bong, MD  nitroGLYCERIN (NITROSTAT) 0.4 MG SL tablet Place 1 tablet (0.4 mg total) under the tongue every 5 (five) minutes as needed for chest pain. 05/01/20   Alver Sorrow, NP  nortriptyline (PAMELOR) 50 MG  capsule Take 1 capsule (50 mg total) by mouth at bedtime. 08/19/21   Johnson, Megan P, DO  omeprazole (PRILOSEC) 40 MG capsule Take 1 capsule (40 mg total) by mouth 2 (two) times daily. TAKE 1 CAPSULE BY MOUTH TWICE DAILY 08/19/21   Olevia Perches P, DO  pregabalin (LYRICA) 150 MG capsule Take 1 capsule (150 mg total) by mouth 2 (two) times daily. 08/19/21   Johnson, Megan P, DO  rosuvastatin (CRESTOR) 40 MG tablet Take 1 tablet (40 mg total) by mouth daily. 10/10/20 01/08/21  Alver Sorrow, NP  traZODone (DESYREL) 100 MG tablet Take 1 tablet (100 mg total) by mouth at bedtime. Take along with one 50 mg tablet for total 150 mg at bedtime 08/19/21   Johnson, Megan P, DO  traZODone (DESYREL) 50 MG tablet Take 1 tablet (50 mg total) by mouth at bedtime. Take along with one 100 mg tablet for total 150 mg at bedtime 08/19/21   Olevia Perches P, DO      Allergies    Patient has no known allergies.    Review of Systems   Review of Systems  Unable to perform ROS: Mental status change    Physical Exam Updated Vital Signs BP (!) 101/53   Pulse 64   Temp (!) 97.3 F (36.3 C) (Oral)   Resp 11   Ht  (1.651 m)   Wt 96.4 kg   SpO2 94%   BMI 35.38 kg/m  Physical Exam Constitutional:      General: She is not in acute distress.    Appearance: Normal appearance.  HENT:     Head: Normocephalic.     Nose: Nose normal.  Eyes:     Extraocular Movements: Extraocular movements intact.  Cardiovascular:     Rate and Rhythm: Normal rate.  Pulmonary:     Effort: Pulmonary effort is normal.  Musculoskeletal:        General: Normal range of motion.     Cervical back: Normal range of motion.  Neurological:     Mental Status: She is alert.     Comments: Weakness appears generalized.  No obvious facial asymmetry noted.  Patient does appear to follow commands but appears generally weak.  Speech appears slurred perhaps aphasic.     ED Results / Procedures / Treatments   Labs (all labs ordered are  listed, but only abnormal results are displayed) Labs Reviewed  CBC - Abnormal; Notable for the following components:      Result Value   Hemoglobin 11.3 (*)    MCH 25.2 (*)    MCHC 29.9 (*)    RDW 16.7 (*)    All other components within normal limits  COMPREHENSIVE METABOLIC PANEL - Abnormal; Notable for the following components:   Glucose, Bld 197 (*)    BUN 38 (*)    Creatinine, Ser  1.55 (*)    Total Protein 5.9 (*)    GFR, Estimated 35 (*)    All other components within normal limits  I-STAT CHEM 8, ED - Abnormal; Notable for the following components:   BUN 37 (*)    Creatinine, Ser 1.50 (*)    Glucose, Bld 192 (*)    TCO2 20 (*)    All other components within normal limits  CBG MONITORING, ED - Abnormal; Notable for the following components:   Glucose-Capillary 175 (*)    All other components within normal limits  PROTIME-INR  APTT  DIFFERENTIAL  ETHANOL  URINALYSIS, ROUTINE W REFLEX MICROSCOPIC    EKG EKG Interpretation  Date/Time:  Wednesday January 02 2022 20:07:48 EDT Ventricular Rate:  69 PR Interval:  172 QRS Duration: 104 QT Interval:  458 QTC Calculation: 491 R Axis:   53 Text Interpretation: Sinus rhythm Abnormal R-wave progression, early transition Borderline repolarization abnormality Borderline prolonged QT interval Confirmed by Norman ClayHong, Pedrohenrique Mcconville ([id number]) on 05-09-22 9:13:20 PM  Radiology MR BRAIN WO CONTRAST  Result Date: 05-09-22 CLINICAL DATA:  Initial evaluation for neuro deficit, stroke suspected. EXAM: MRI HEAD WITHOUT CONTRAST TECHNIQUE: Multiplanar, multiecho pulse sequences of the brain and surrounding structures were obtained without intravenous contrast. COMPARISON:  Prior CT from earlier the same day. FINDINGS: Brain: Generalized age-related cerebral atrophy. Patchy and confluent T2/FLAIR hyperintensity involving the periventricular deep white matter both cerebral hemispheres, most consistent with chronic small vessel ischemic disease, moderately  advanced in nature. Associated scattered remote lacunar infarcts about the deep gray nuclei. Multifocal areas of encephalomalacia and gliosis involving the anterior right frontal lobe, parasagittal posterior left frontal lobe, and bilateral parieto-occipital regions, consistent with chronic ischemic infarcts. Few additional small remote bilateral cerebellar infarcts. Scattered chronic blood products noted about a few of these chronic ischemic changes. No abnormal foci of restricted diffusion to suggest acute or subacute ischemia. Gray-white matter differentiation maintained. No other acute or chronic intracranial blood products. No mass lesion, midline shift or mass effect. No hydrocephalus or extra-axial fluid collection. Pituitary gland suprasellar region within normal limits. Vascular: Major intracranial vascular flow voids are maintained. Skull and upper cervical spine: Craniocervical junction with normal limits. Bone marrow signal intensity normal. No scalp soft tissue abnormality. Sinuses/Orbits: Prior bilateral ocular lens replacement. Paranasal sinuses and mastoid air cells are largely clear. Other: None. IMPRESSION: 1. No acute intracranial abnormality. 2. Underlying age-related cerebral atrophy with moderate chronic small vessel ischemic disease, with multiple remote ischemic infarcts as above. Electronically Signed   By: Rise MuBenjamin  McClintock M.D.   On: 010-19-23 22:54   MR Cervical Spine Wo Contrast  Result Date: 05-09-22 CLINICAL DATA:  Initial evaluation for non accidental trauma suspected, abnormal neuro exam. EXAM: MRI CERVICAL SPINE WITHOUT CONTRAST TECHNIQUE: Multiplanar, multisequence MR imaging of the cervical spine was performed. No intravenous contrast was administered. COMPARISON:  None available. FINDINGS: Alignment: Examination degraded by motion artifact. Straightening with mild reversal of the normal cervical lordosis. Trace anterolisthesis of C3 on C4 and C4 on C5, likely chronic  and degenerative. Vertebrae: Vertebral body height maintained without acute or chronic fracture. Bone marrow signal intensity within normal limits. No discrete or worrisome osseous lesions. Minimal reactive marrow edema present about the right C3-4 facet due to facet arthritis. Mild discogenic reactive endplate change present about the C5-6 interspace. No other abnormal marrow edema. Cord: Normal signal morphology. Posterior Fossa, vertebral arteries, paraspinal tissues: Atrophy with chronic ischemic changes noted within the partially visualized brain. Paraspinous soft tissues within normal  limits. Right vertebral artery hypoplastic and not well seen, possibly occluded within the neck. Normal preserved flow voids seen within the dominant left vertebral artery. Disc levels: C2-C3: Small central disc protrusion indents the ventral thecal sac. Moderate left-sided facet hypertrophy. No spinal stenosis. Mild left C3 foraminal narrowing. Right neural foramen remains patent. C3-C4: Trace anterolisthesis. Broad-based right paracentral disc protrusion flattens and indents the right ventral thecal sac (series 5, image 32). Resultant mild spinal stenosis. Superimposed right worse than left uncovertebral and facet hypertrophy. Moderate right C4 foraminal stenosis. Left neural foramen remains patent. C4-C5: Degenerative intervertebral disc space narrowing. Right paracentral disc osteophyte complex flattens and indents the ventral thecal sac. Mild cord flattening without cord signal changes. Mild to moderate spinal stenosis. Superimposed right greater than left uncovertebral and facet hypertrophy with resultant moderate right C5 foraminal stenosis. Left neural foramina remains grossly patent. C5-C6: Degenerative intervertebral disc space narrowing with circumferential disc osteophyte complex. Broad posterior component indents and flattens the ventral thecal sac. Moderate spinal stenosis with mild cord flattening but no cord  signal changes. Severe right with moderate left C6 foraminal narrowing. C6-C7: Degenerative intervertebral disc space narrowing. Right paracentral disc osteophyte complex indents and partially effaces the ventral thecal sac. Mild cord flattening without cord signal changes. Mild spinal stenosis. Moderate right worse than left C7 foraminal narrowing. C7-T1: Negative interspace. Mild facet hypertrophy. No canal or foraminal stenosis. Visualized upper thoracic spine demonstrates no significant finding. IMPRESSION: 1. No acute abnormality within the cervical spine. 2. Multilevel cervical spondylosis with resultant mild to moderate diffuse spinal stenosis at C3-4 through C6-7. 3. Multifactorial degenerative changes with resultant multilevel foraminal narrowing as above. Notable findings include moderate right C4 and C5 foraminal stenosis, severe right with moderate left C6 foraminal narrowing, with moderate right worse than left C7 foraminal stenosis. 4. 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 Preserved flow void within the dominant left vertebral artery. Electronically Signed   By: Rise Mu M.D.   On: 12/28/2021 22:40   CT HEAD CODE STROKE WO CONTRAST  Result Date: 12/21/2021 CLINICAL DATA:  Code stroke. Provided history: Neuro deficit, acute, stroke suspected. EXAM: CT HEAD WITHOUT CONTRAST TECHNIQUE: Contiguous axial images were obtained from the base of the skull through the vertex without intravenous contrast. RADIATION DOSE REDUCTION: This exam was performed according to the departmental dose-optimization program which includes automated exposure control, adjustment of the mA and/or kV according to patient size and/or use of iterative reconstruction technique. COMPARISON:  Brain MRI 01/07/2021. head CT 11/02/2021. FINDINGS: Brain: Mild generalized parenchymal atrophy. Redemonstrated chronic cortical/subcortical infarcts within the  posteromedial left frontal lobe and bilateral parietooccipital lobes. Chronic lacunar infarcts within the bilateral basal ganglia, thalami and cerebellar hemispheres, some of which were better appreciated on the prior brain MRI of 01/07/2021. Background mild patchy and ill-defined hypoattenuation within the cerebral white matter, nonspecific but compatible with chronic small vessel ischemic disease. There is no acute intracranial hemorrhage. No acute demarcated cortical infarct. No extra-axial fluid collection. No evidence of an intracranial mass. No midline shift. Vascular: No hyperdense vessel. Atherosclerotic calcifications. Skull: No fracture or aggressive osseous lesion. Sinuses/Orbits: No mass or acute finding within the imaged orbits. Minimal mucosal thickening within the bilateral ethmoid and right maxillary sinuses. ASPECTS [facility] Stroke Program Early CT Score) - Ganglionic level infarction (caudate, lentiform nuclei, internal capsule, insula, M1-M3 cortex): 7 - Supraganglionic infarction (M4-M6 cortex): 3 Total score (0-10 with 10 being normal): 10 (when discounting chronic infarcts). These results  were communicated to Dr. Otelia Limes at 8:01 pmon 06/23/2023by text page via the [facility] messaging system. IMPRESSION: No evidence of acute intracranial abnormality. Parenchymal atrophy, chronic small vessel ischemic disease and multiple chronic infarcts as described and not appreciably changed from the prior brain MRI of 01/07/2021. Electronically Signed   By: Jackey Loge D.O.   On: 01/07/2022 20:02    Procedures .Critical Care  Performed by: Cheryll Cockayne, MD Authorized by: Cheryll Cockayne, MD   Critical care provider statement:    Critical care time (minutes):  40   Critical care time was exclusive of:  Separately billable procedures and treating other patients and teaching time   Critical care was necessary to treat or prevent imminent or life-threatening deterioration of the following conditions:  CNS  failure or compromise     Medications Ordered in ED Medications  sodium chloride 0.9 % bolus 1,000 mL (has no administration in time range)  sodium chloride flush (NS) 0.9 % injection 3 mL (3 mLs Intravenous Given 12/29/2021 2016)    ED Course/ Medical Decision Making/ A&P                           Medical Decision Making Amount and/or Complexity of Data Reviewed Labs: ordered. Radiology: ordered.   Patient was a code stroke activation from the field stroke team awaiting patient's arrival.  Review of records shows outpatient visit Nov 27, 2021 for chronic pain of knees.  History obtained from granddaughter at bedside.  Work-up included labs CBC chemistry which were unremarkable.  EtOH levels undetectable.  CT imaging of the brain shows no acute findings.  MRI of the brain also shows no evidence of any acute strokes.  Patient evaluated neurology who is pursuing further MRIs of the spine.  Patient will be admitted to the hospitalist team for acute encephalopathy generalized weakness.        Final Clinical Impression(s) / ED Diagnoses Final diagnoses:  Acute encephalopathy  Generalized weakness    Rx / DC Orders ED Discharge Orders     None         Cheryll Cockayne, MD 01/02/22 2319

## 2022-01-02 NOTE — Code Documentation (Addendum)
Stroke Response Nurse Documentation Code Documentation  Lory Nowaczyk Elenbaas is a 76 y.o. female arriving to [facility]  via [facility] on 6/14 with past medical hx of HTN, HLD, occipital CVA. On aspirin 325 mg daily and clopidogrel 75 mg daily. Code stroke was activated by EMS.   Patient from home where she was LKW at 1740 and now complaining of Hallucinations and bilateral arm weakness and leg weakness .   Stroke team at the bedside on patient arrival. Labs drawn and patient cleared for CT by Dr. Lockie Mola. Patient to CT with team. NIHSS 9, see documentation for details and code stroke times. Patient with decreased LOC, left facial droop, bilateral leg weakness, left decreased sensation, Expressive aphasia , and dysarthria  on exam. The following imaging was completed:  CT Head and MRI. Patient is not a candidate for IV Thrombolytic due to nondisabling deficits. Patient is not a candidate for IR due to No LVO symptoms.   Care Plan: Stat MRI.   Bedside handoff with ED RN Mardella Layman.    Rose Fillers  Rapid Response RN

## 2022-01-02 NOTE — ED Notes (Signed)
Dr Georg Ruddle at bedside at this time (neurologist)

## 2022-01-02 NOTE — ED Notes (Signed)
Back in ER room at this time/ Neurologist at bedside doing assessment

## 2022-01-02 NOTE — ED Notes (Signed)
Provider at bedside

## 2022-01-02 NOTE — ED Triage Notes (Signed)
Patient alert and verbal but appears very lethargic and having difficult speech (aphasia and diarthric). Patient has hx of stroke in the past. Patient reports that she was hallucinating yesterday which resolved today. Patient reports that she was walking yesterday and today at 1740 she was no longer able to walk or use her arms (per EMS per family members on scene). Patient verbalizes that she is normally able to walk and preform ADLs at home. Per EMS patient was not able to do those things today starting at 1740 witnessed by family  

## 2022-01-02 NOTE — ED Notes (Signed)
Patient alert and verbal but appears very lethargic and having difficult speech (aphasia and diarthric). Patient has hx of stroke in the past. Patient reports that she was hallucinating yesterday which resolved today. Patient reports that she was walking yesterday and today at 1740 she was no longer able to walk or use her arms (per EMS per family members on scene). Patient verbalizes that she is normally able to walk and preform ADLs at home. Per EMS patient was not able to do those things today starting at 1740 witnessed by family

## 2022-01-02 NOTE — ED Notes (Signed)
This RN spoke with MRI at this time who reports that they will need a chest XR and abdominal  XR to be able to clear the patient to get the MRI.This RN asks if they were able to contact the patient's emergency contact they report that they were unable to reach her phone. This RN tells the provider then calls the patient's emergency contact who picked up the phone and took MRIs number down and reports that she will call MRI. The patient's emergency contact reports understanding of the patient's current situation and verbalizes that her granddaughter is on the way with POA paperwork.

## 2022-01-03 ENCOUNTER — Inpatient Hospital Stay (HOSPITAL_COMMUNITY): Payer: Medicare Other

## 2022-01-03 DIAGNOSIS — G9340 Encephalopathy, unspecified: Secondary | ICD-10-CM | POA: Diagnosis not present

## 2022-01-03 DIAGNOSIS — R531 Weakness: Secondary | ICD-10-CM

## 2022-01-03 LAB — MAGNESIUM: Magnesium: 2 mg/dL (ref 1.7–2.4)

## 2022-01-03 LAB — CBC WITH DIFFERENTIAL/PLATELET
Abs Immature Granulocytes: 0.01 10*3/uL (ref 0.00–0.07)
Basophils Absolute: 0 10*3/uL (ref 0.0–0.1)
Basophils Relative: 0 %
Eosinophils Absolute: 0 10*3/uL (ref 0.0–0.5)
Eosinophils Relative: 1 %
HCT: 34.8 % — ABNORMAL LOW (ref 36.0–46.0)
Hemoglobin: 10.5 g/dL — ABNORMAL LOW (ref 12.0–15.0)
Immature Granulocytes: 0 %
Lymphocytes Relative: 27 %
Lymphs Abs: 1.3 10*3/uL (ref 0.7–4.0)
MCH: 25.4 pg — ABNORMAL LOW (ref 26.0–34.0)
MCHC: 30.2 g/dL (ref 30.0–36.0)
MCV: 84.3 fL (ref 80.0–100.0)
Monocytes Absolute: 0.4 10*3/uL (ref 0.1–1.0)
Monocytes Relative: 7 %
Neutro Abs: 3.3 10*3/uL (ref 1.7–7.7)
Neutrophils Relative %: 65 %
Platelets: 181 10*3/uL (ref 150–400)
RBC: 4.13 MIL/uL (ref 3.87–5.11)
RDW: 17 % — ABNORMAL HIGH (ref 11.5–15.5)
WBC: 5 10*3/uL (ref 4.0–10.5)
nRBC: 0 % (ref 0.0–0.2)

## 2022-01-03 LAB — BLOOD GAS, VENOUS
Acid-base deficit: 3.6 mmol/L — ABNORMAL HIGH (ref 0.0–2.0)
Bicarbonate: 23.1 mmol/L (ref 20.0–28.0)
O2 Saturation: 68.6 %
Patient temperature: 36.4
pCO2, Ven: 46 mmHg (ref 44–60)
pH, Ven: 7.31 (ref 7.25–7.43)
pO2, Ven: 39 mmHg (ref 32–45)

## 2022-01-03 LAB — COMPREHENSIVE METABOLIC PANEL
ALT: 16 U/L (ref 0–44)
AST: 21 U/L (ref 15–41)
Albumin: 3.2 g/dL — ABNORMAL LOW (ref 3.5–5.0)
Alkaline Phosphatase: 42 U/L (ref 38–126)
Anion gap: 8 (ref 5–15)
BUN: 39 mg/dL — ABNORMAL HIGH (ref 8–23)
CO2: 22 mmol/L (ref 22–32)
Calcium: 8.5 mg/dL — ABNORMAL LOW (ref 8.9–10.3)
Chloride: 113 mmol/L — ABNORMAL HIGH (ref 98–111)
Creatinine, Ser: 1.6 mg/dL — ABNORMAL HIGH (ref 0.44–1.00)
GFR, Estimated: 34 mL/min — ABNORMAL LOW (ref >60)
Glucose, Bld: 123 mg/dL — ABNORMAL HIGH (ref 70–99)
Potassium: 3.8 mmol/L (ref 3.5–5.1)
Sodium: 143 mmol/L (ref 135–145)
Total Bilirubin: 0.6 mg/dL (ref 0.3–1.2)
Total Protein: 5.6 g/dL — ABNORMAL LOW (ref 6.5–8.1)

## 2022-01-03 LAB — HEMOGLOBIN A1C
Hgb A1c MFr Bld: 7.4 % — ABNORMAL HIGH (ref 4.8–5.6)
Mean Plasma Glucose: 165.68 mg/dL

## 2022-01-03 LAB — CBG MONITORING, ED
Glucose-Capillary: 103 mg/dL — ABNORMAL HIGH (ref 70–99)
Glucose-Capillary: 114 mg/dL — ABNORMAL HIGH (ref 70–99)
Glucose-Capillary: 134 mg/dL — ABNORMAL HIGH (ref 70–99)

## 2022-01-03 LAB — GLUCOSE, CAPILLARY
Glucose-Capillary: 104 mg/dL — ABNORMAL HIGH (ref 70–99)
Glucose-Capillary: 114 mg/dL — ABNORMAL HIGH (ref 70–99)

## 2022-01-03 LAB — AMMONIA: Ammonia: 45 umol/L — ABNORMAL HIGH (ref 9–35)

## 2022-01-03 LAB — PHOSPHORUS: Phosphorus: 4.2 mg/dL (ref 2.5–4.6)

## 2022-01-03 LAB — TSH: TSH: 1.093 u[IU]/mL (ref 0.350–4.500)

## 2022-01-03 IMAGING — MR MR THORACIC SPINE W/O CM
4 series · 31 of 48 positions shown · non-contrast
Comparison: Previous exam from [DATE].

CLINICAL DATA: Initial evaluation for acute encephalopathy with
generalized weakness.

EXAM:
MRI THORACIC SPINE WITHOUT CONTRAST
TECHNIQUE: Multiplanar, multisequence MR imaging of the thoracic spine was
performed. No intravenous contrast was administered.

[Series 19: T2 · sagittal · 3.0mm · 1.06mm/px · 8 of 22 slices shown (1 of 2)]
[im 1/22]
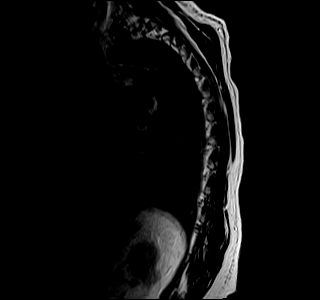
[im 3/22]
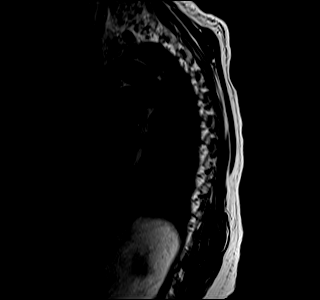
[im 8/22]
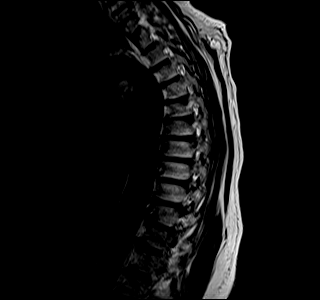
[im 10/22]
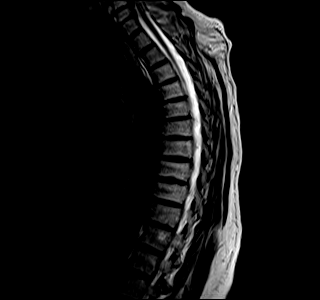
[im 12/22]
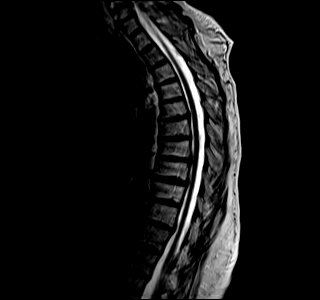
[im 15/22]
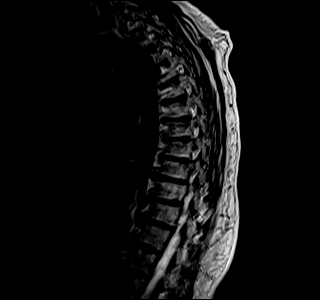
[im 19/22]
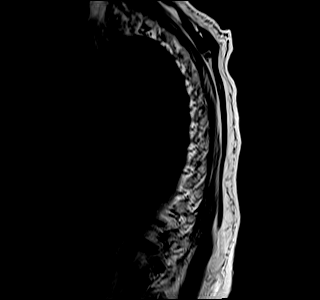
[im 22/22]
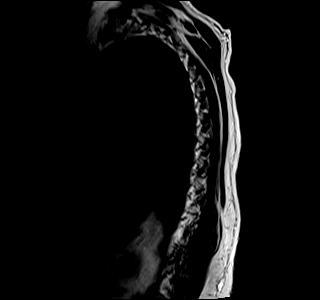

[Series 20: T1 · sagittal · 3.0mm · 1.06mm/px · 8 of 22 slices shown]
[im 1/22]
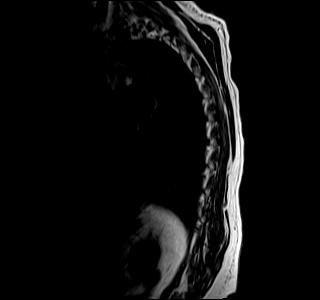
[im 3/22]
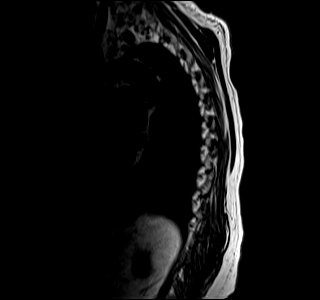
[im 8/22]
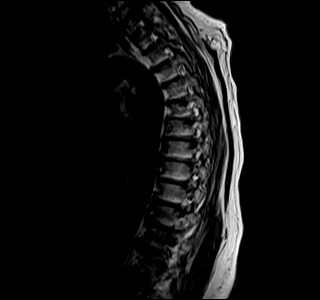
[im 10/22]
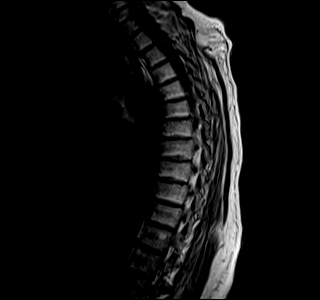
[im 12/22]
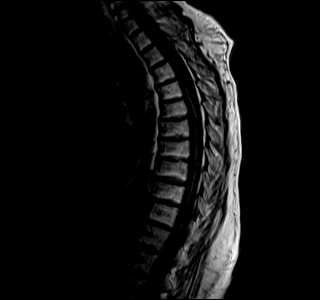
[im 15/22]
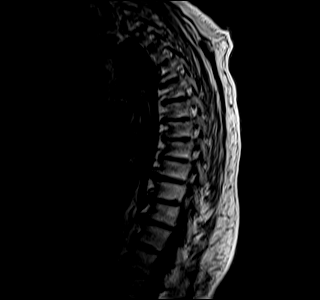
[im 19/22]
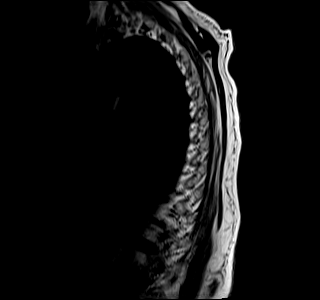
[im 22/22]
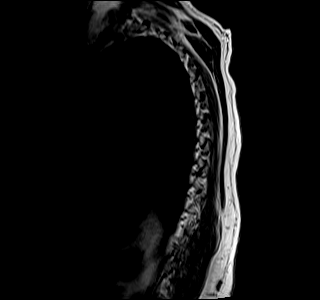

[Series 21: STIR · sagittal · 3.0mm · 0.53mm/px · 5 of 22 slices shown]
[im 1/22]
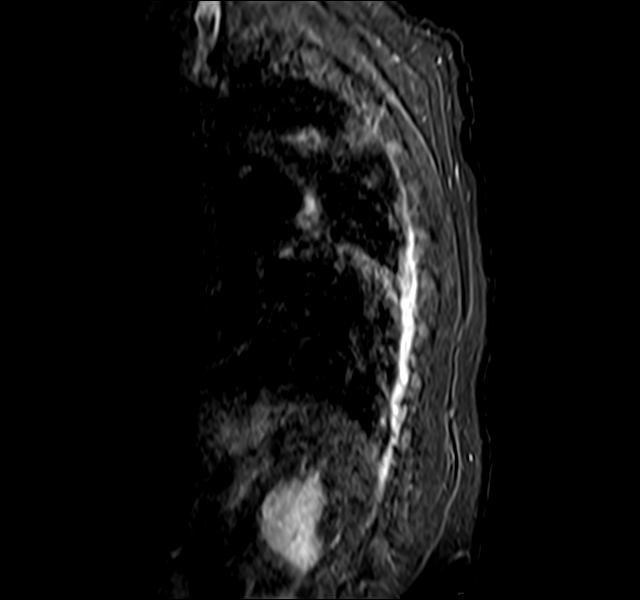
[im 3/22]
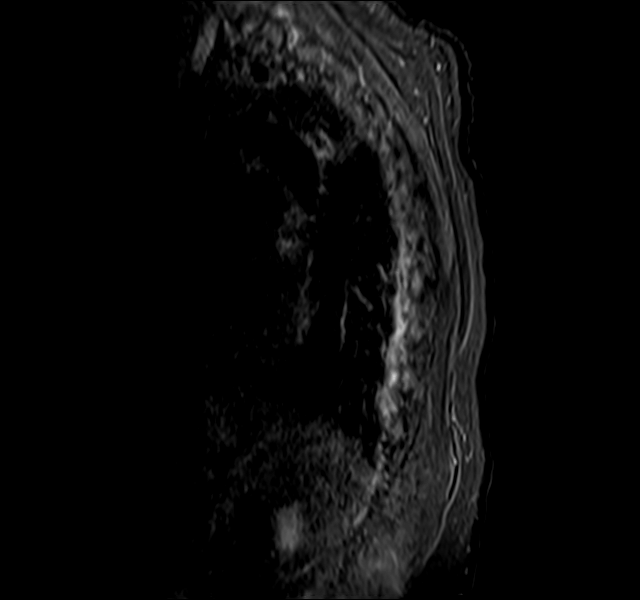
[im 8/22]
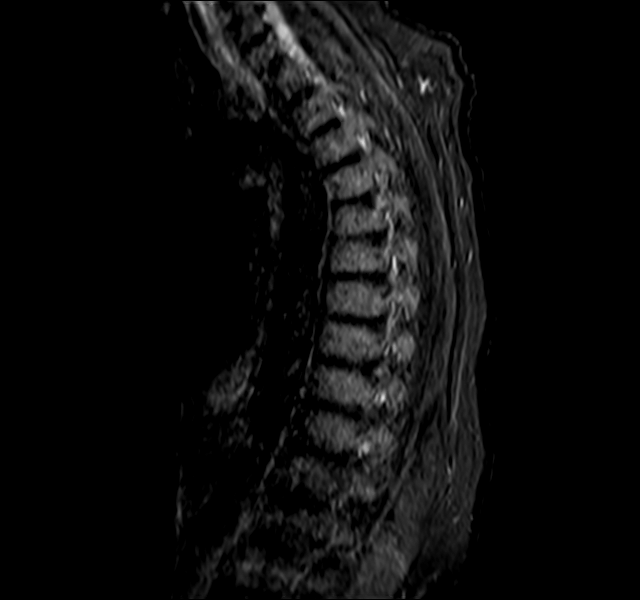
[im 12/22]
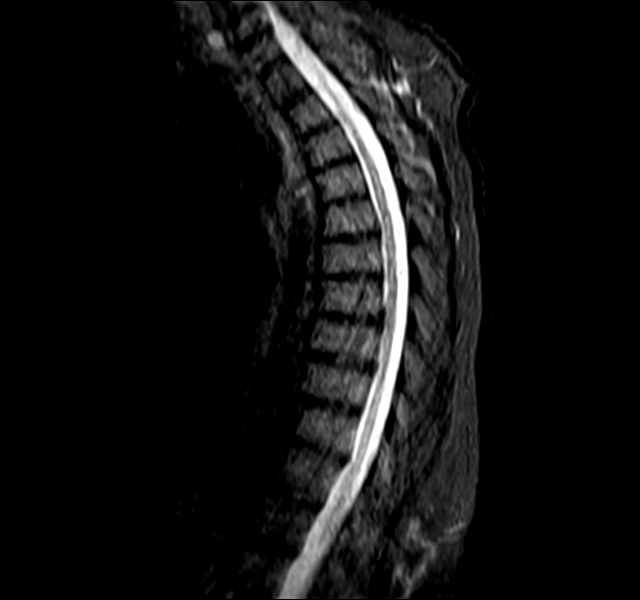
[im 19/22]
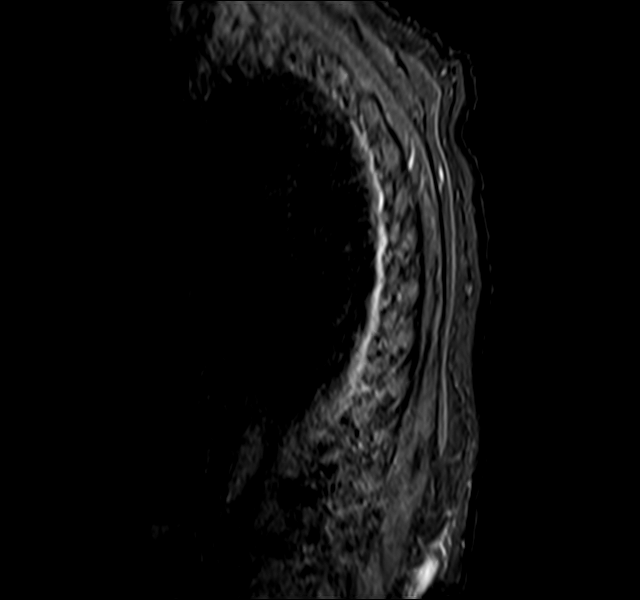

[Series 22: T2 · axial · 5.0mm · 0.59mm/px · z∈[-186,-37]mm · 10 of 39 slices shown (2 of 2)]
[im 3/39]
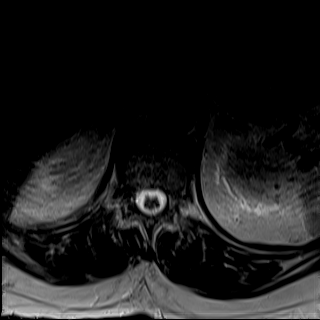
[im 7/39]
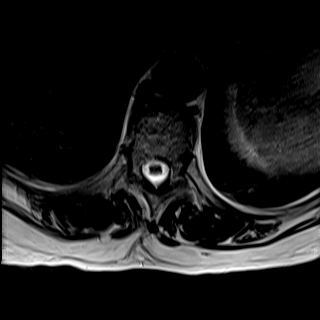
[im 12/39]
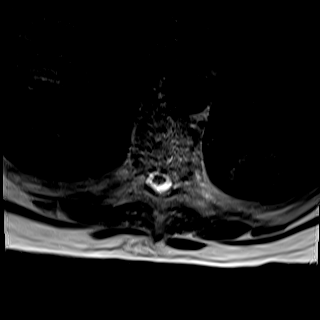
[im 16/39]
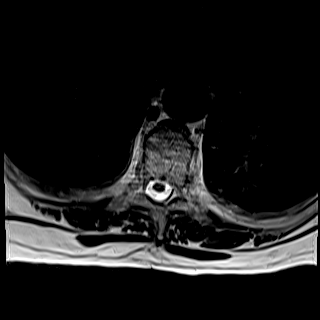
[im 21/39]
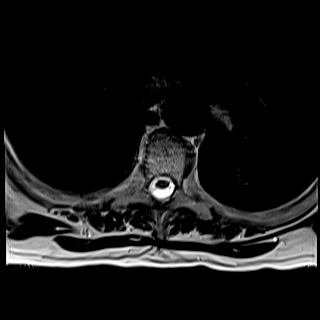
[im 23/39]
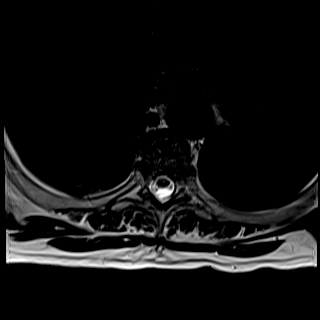
[im 27/39]
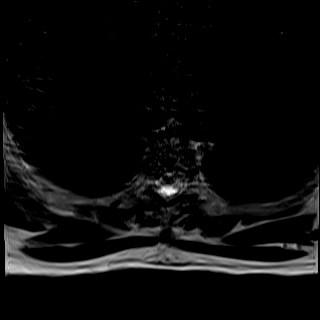
[im 32/39]
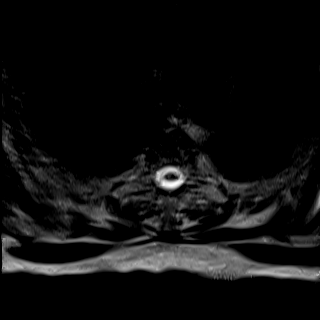
[im 34/39]
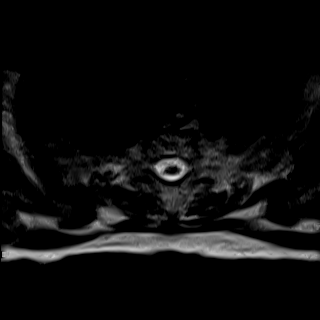
[im 36/39]
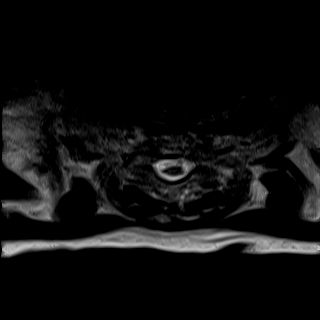

[31 of 48 positions shown; findings below may reference images not displayed]

FINDINGS: Alignment: Examination is technically limited as the patient was
unable to tolerate the full length of the exam. No counter sequence
is provided, and vertebral body count is made in comparison with
previous exam and prior cervical spine MRI from [DATE].
Additionally, axial GRE sequence was not performed either. The
images at are provided are moderately degraded by motion.

Mild exaggeration of the normal thoracic kyphosis. No significant
listhesis.

Vertebrae: Vertebral body height maintained without acute or chronic
fracture. Bone marrow signal intensity within normal limits. Small
benign hemangioma noted within the T11 vertebral body. No worrisome
osseous lesions. No abnormal marrow edema.

Cord: Grossly normal signal and morphology. No appreciable cord
signal changes on this motion degraded and limited exam.

Paraspinal and other soft tissues: Unremarkable.

Disc levels:

T1-2: Unremarkable.

T2-3: Negative interspace. Mild left-sided facet hypertrophy. No
stenosis.

T3-4: Unremarkable.

T4-5: Unremarkable.

T5-6: Small right paracentral disc protrusion indents the ventral
thecal sac. No significant spinal stenosis or cord deformity.
Foramina remain patent.

T6-7: Disc bulge with small right paracentral disc protrusion.
Reactive endplate spurring. No significant spinal stenosis. Foramina
remain patent.

T7-8: Left paracentral disc protrusion indents the left ventral
thecal sac, contacting and minimally flattening the left ventral
cord. No cord signal changes. No significant spinal stenosis.
Foramina remain patent.

T8-9: Small left paracentral disc protrusion indents the left
ventral thecal sac. Minimal flattening of the left ventral cord
without cord signal changes or significant spinal stenosis. Foramina
remain patent.

T9-10: Chronic endplate Schmorl's node deformity without significant
disc bulge. No stenosis.

T10-11: Disc bulge with small central disc protrusion. Mild facet
hypertrophy. No significant spinal stenosis. Foramina remain patent.

T11-12: Unremarkable.

T12-L1: Seen only on sagittal projection. Small central disc
protrusion without significant spinal stenosis. Foramina remain
patent.
IMPRESSION: 1. Technically limited exam due to the patient's inability to
tolerate the full length of the study and motion artifact.
2. Grossly normal MRI appearance of the thoracic spinal cord.
3. Small disc protrusions at T5-6 through T10-11 as above. No
significant spinal stenosis.

## 2022-01-03 IMAGING — US US ABDOMEN LIMITED
1 series · 14 of 25 positions shown · non-contrast
Comparison: None Available.

CLINICAL DATA: Abdominal pain

EXAM:
ULTRASOUND ABDOMEN LIMITED RIGHT UPPER QUADRANT

[Series 1: us abdomen limited ruq (liver/gb) · 14 of 44 slices shown]
[im 1/44]
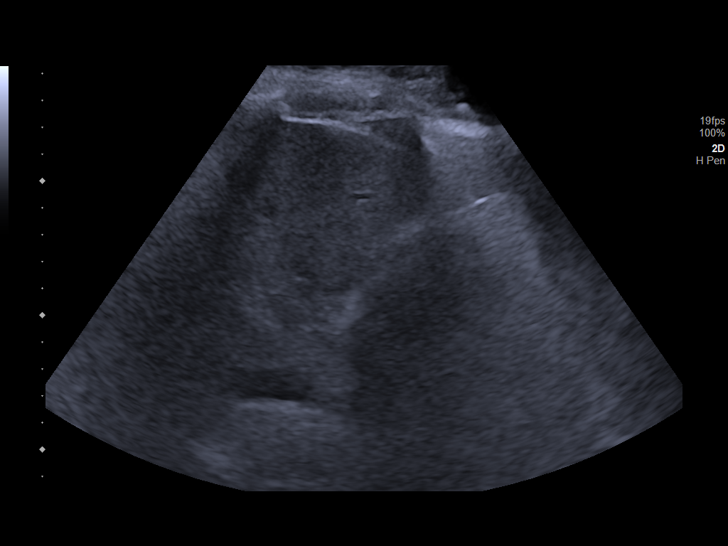
[im 4/44]
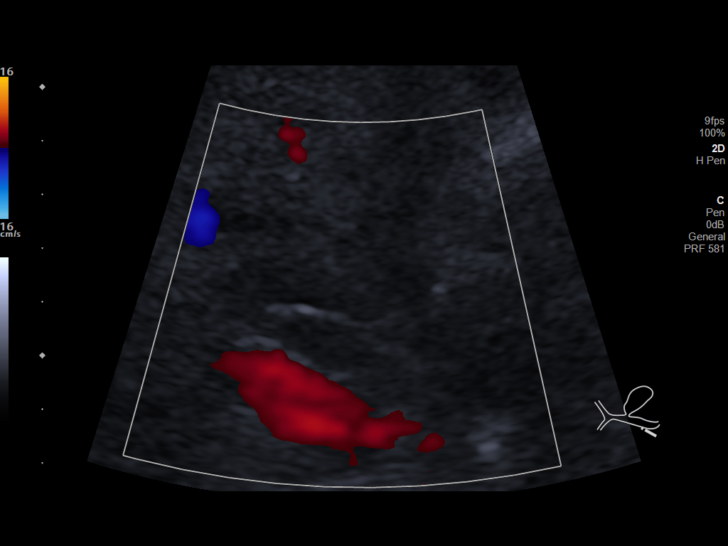
[im 8/44]
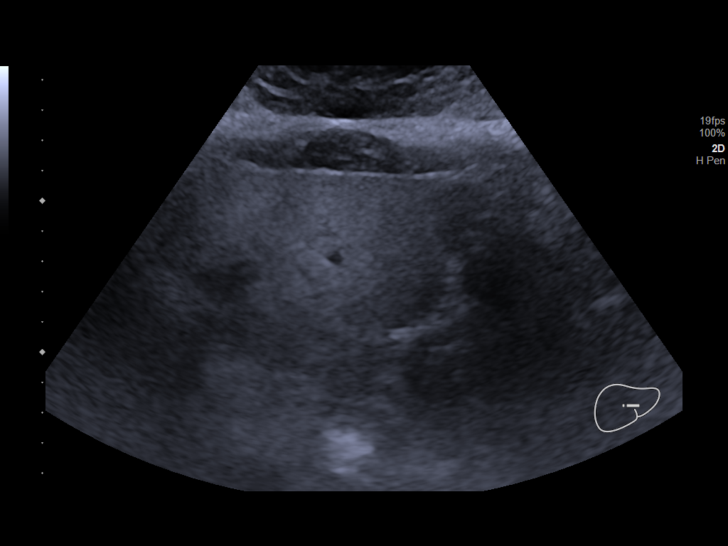
[im 11/44]
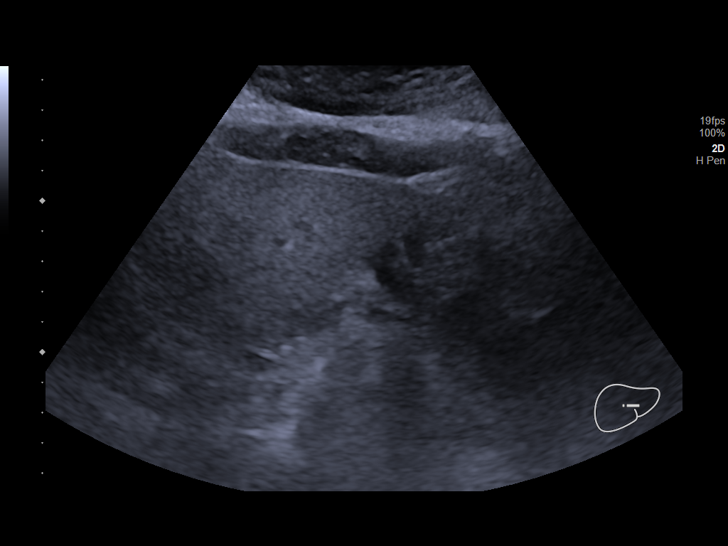
[im 15/44]
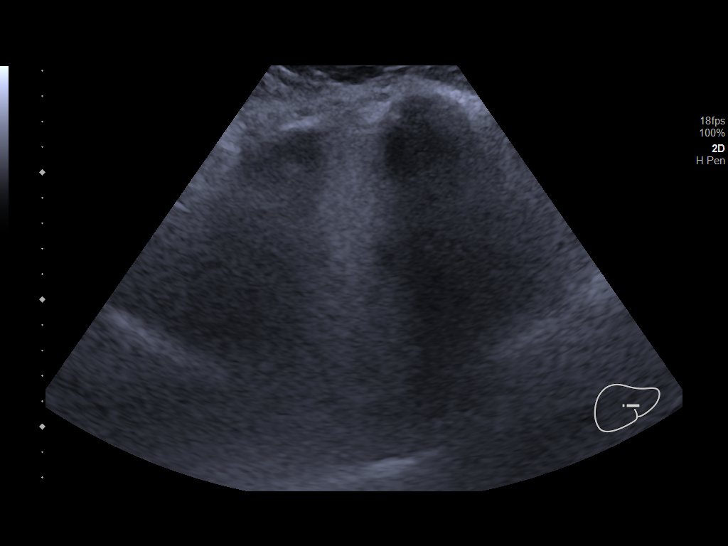
[im 17/44]
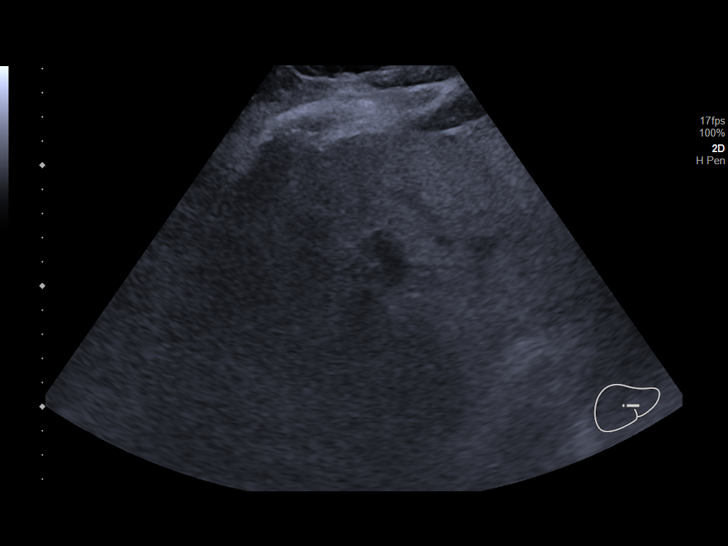
[im 20/44]
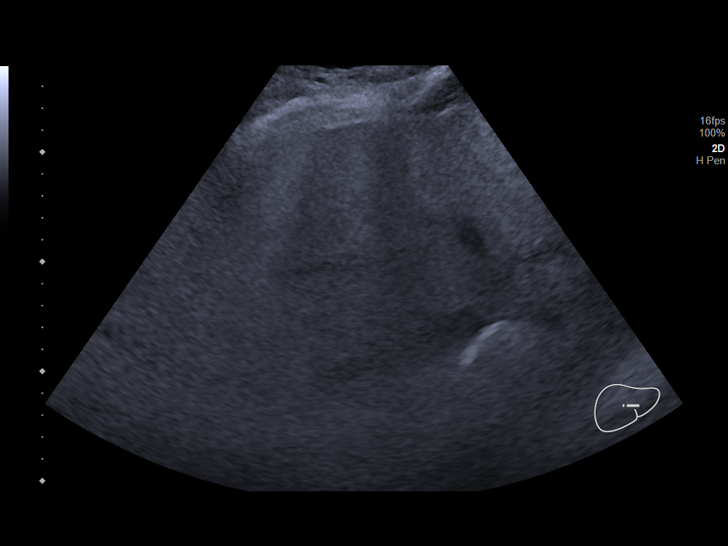
[im 24/44]
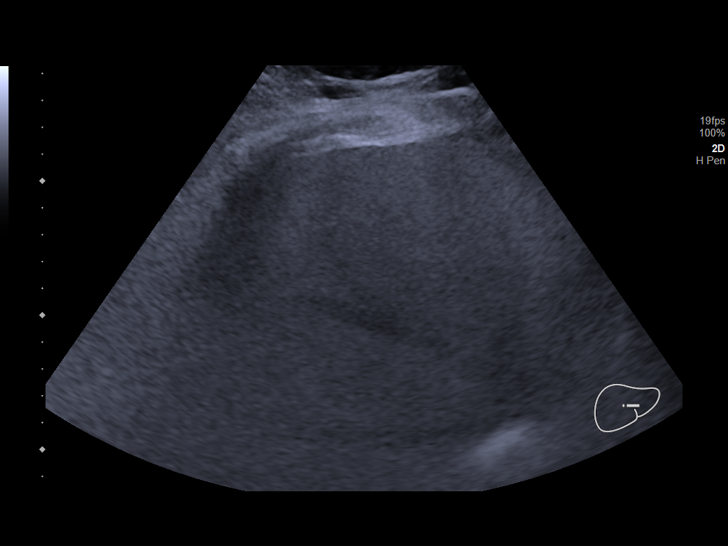
[im 27/44]
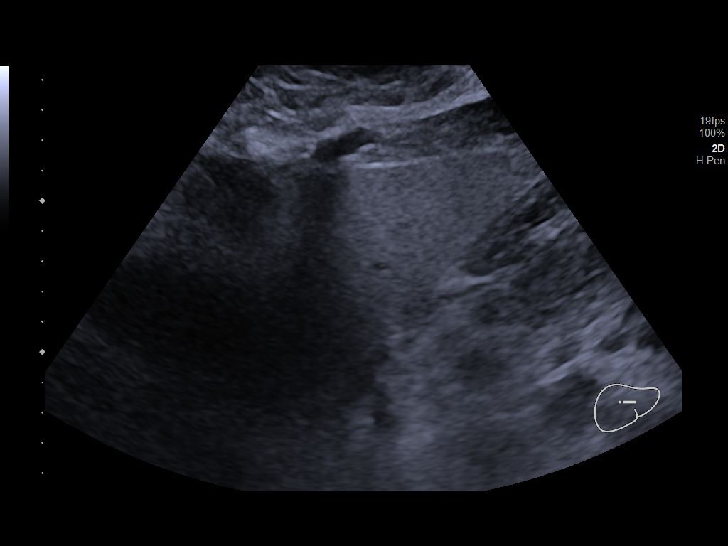
[im 29/44]
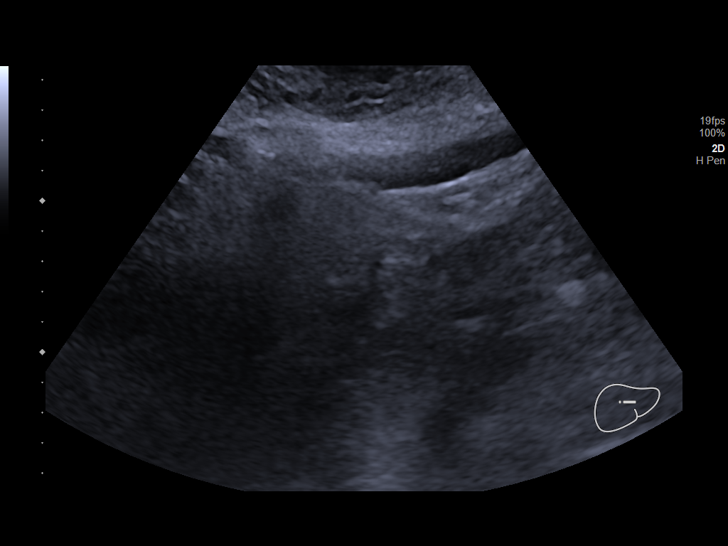
[im 33/44]
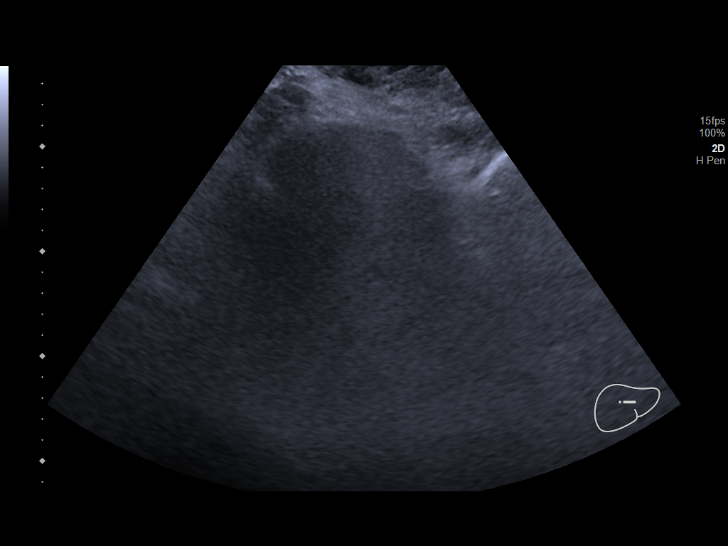
[im 36/44]
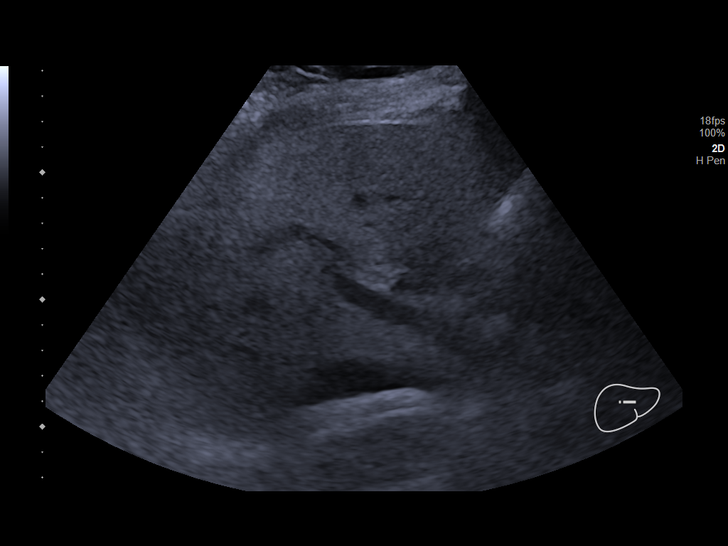
[im 40/44]
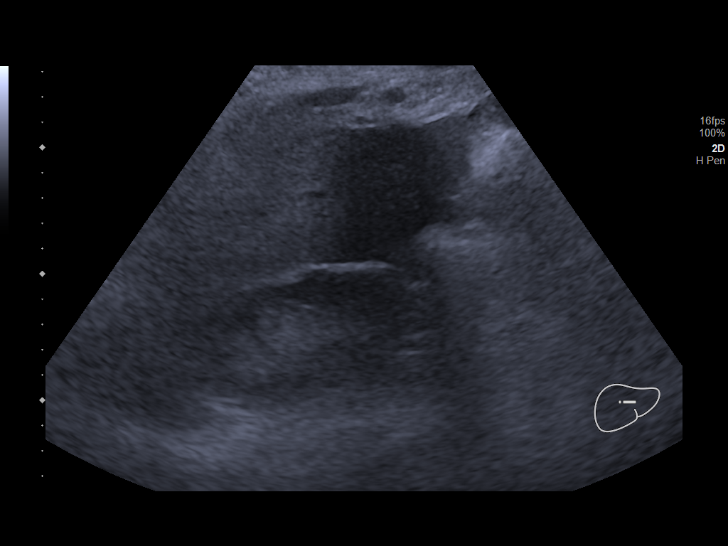
[im 44/44]
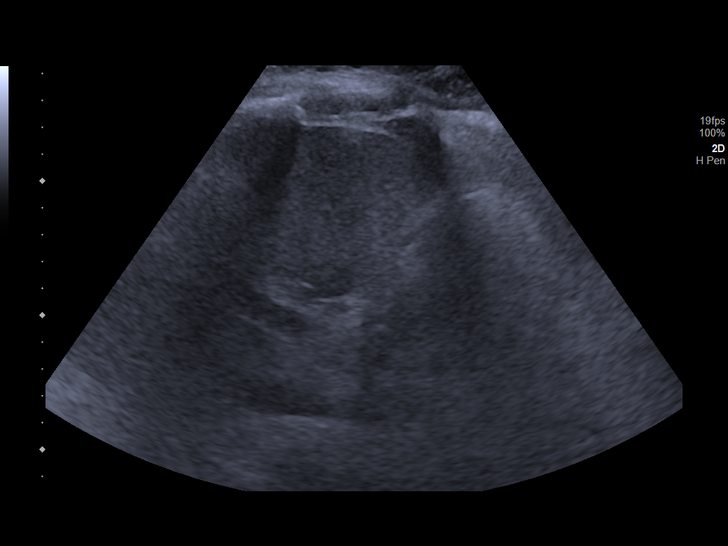

[14 of 25 positions shown; findings below may reference images not displayed]

FINDINGS: Gallbladder:

Gallbladder is not seen consistent with cholecystectomy.

Common bile duct:

Diameter: 8.7 mm

Liver:

There is increased echogenicity suggesting fatty infiltration. No
focal abnormality is seen in the visualized portions of liver.
Portal vein is patent on color Doppler imaging with normal direction
of blood flow towards the liver.

Other: None.
IMPRESSION: Fatty liver. Status post cholecystectomy. No other sonographic
abnormality is seen in the right upper quadrant.

## 2022-01-03 IMAGING — MR MR LUMBAR SPINE W/O CM
2 series · 29 of 38 positions shown · non-contrast
Comparison: Prior MRI from [DATE].

CLINICAL DATA: Initial evaluation for acute encephalopathy,
generalized weakness.

EXAM:
MRI LUMBAR SPINE WITHOUT CONTRAST
TECHNIQUE: Multiplanar, multisequence MR imaging of the lumbar spine was
performed. No intravenous contrast was administered.

[Series 1: T2 · sagittal · 4.0mm · 0.88mm/px · 19 of 19 slices shown]
[im 1/19]
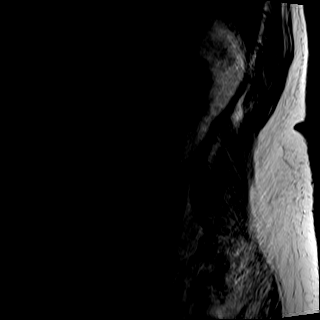
[im 2/19]
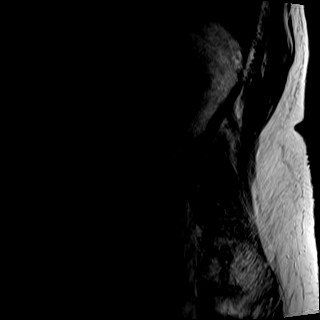
[im 3/19]
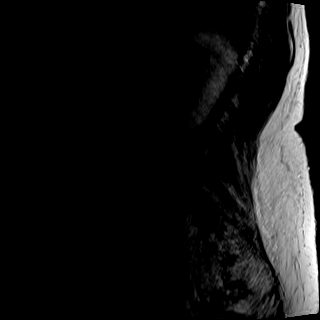
[im 4/19]
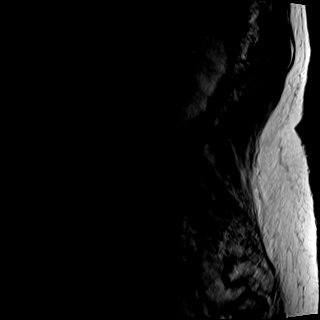
[im 5/19]
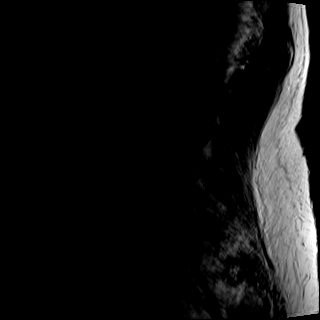
[im 6/19]
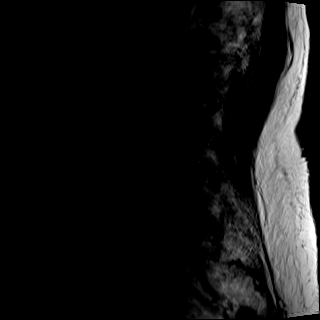
[im 7/19]
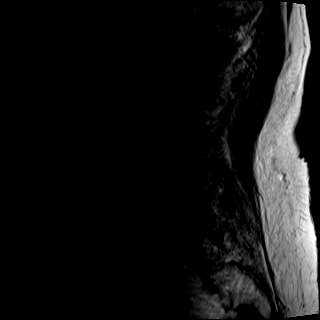
[im 8/19]
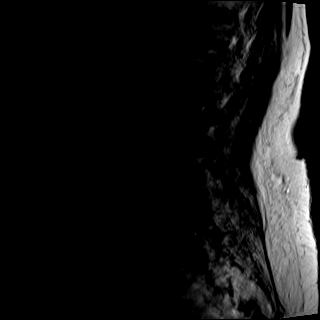
[im 9/19]
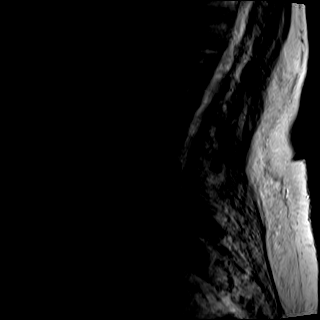
[im 10/19]
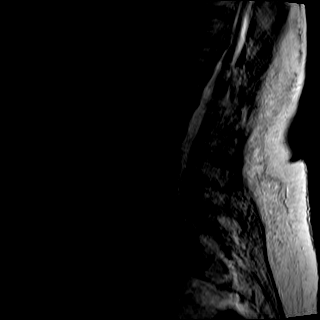
[im 11/19]
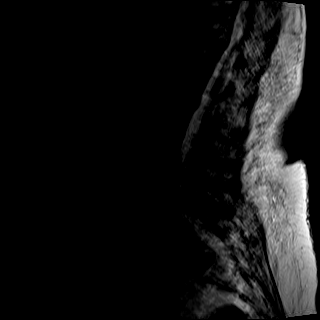
[im 12/19]
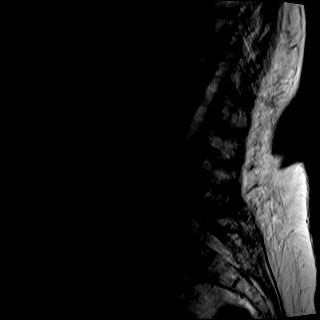
[im 13/19]
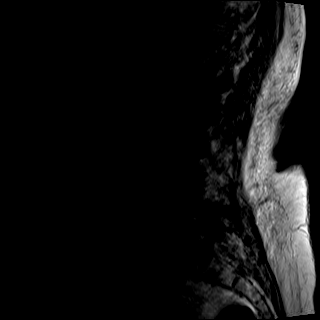
[im 14/19]
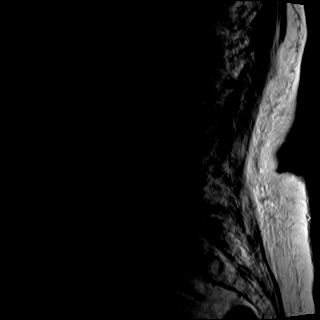
[im 15/19]
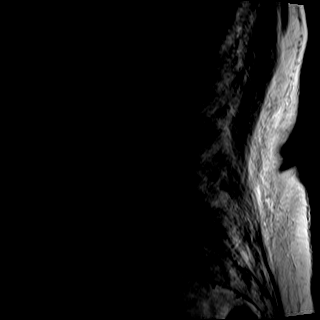
[im 16/19]
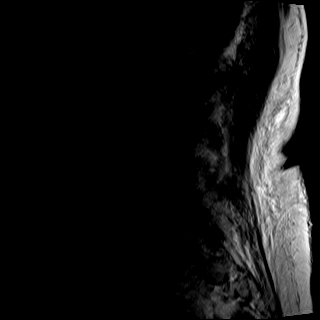
[im 17/19]
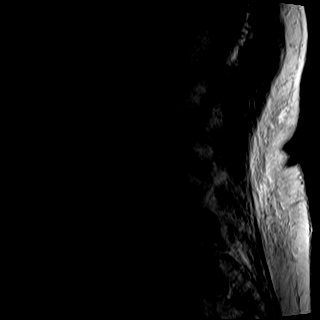
[im 18/19]
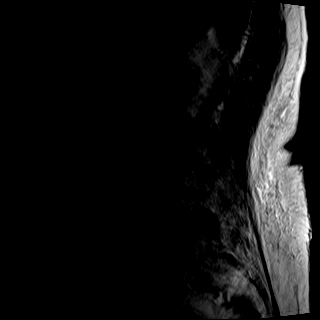
[im 19/19]
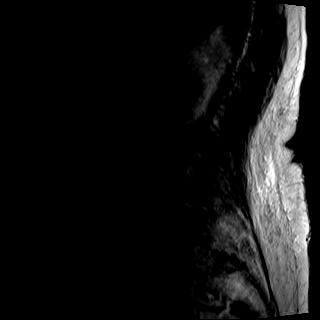

[Series 2: STIR · sagittal · 4.0mm · 0.55mm/px · 10 of 19 slices shown]
[im 1/19]
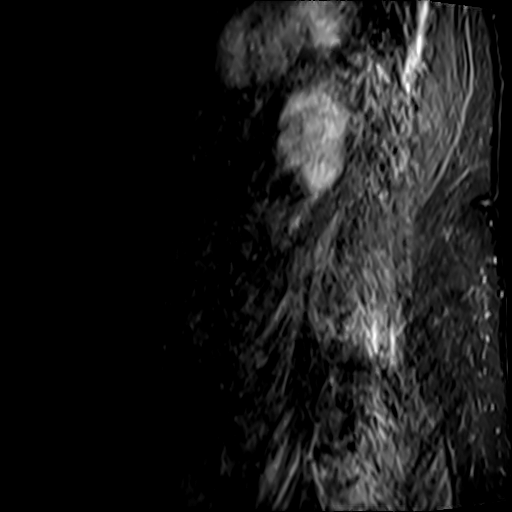
[im 2/19]
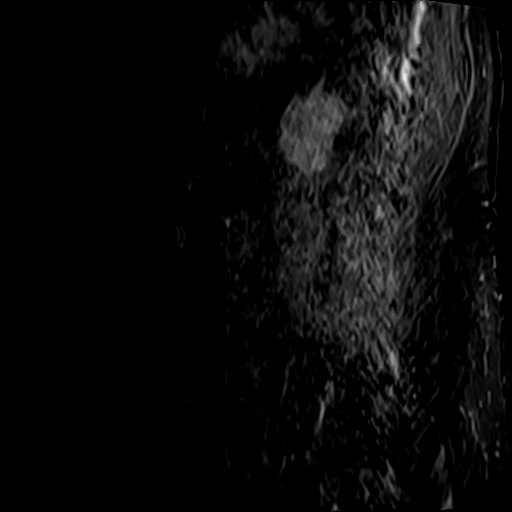
[im 4/19]
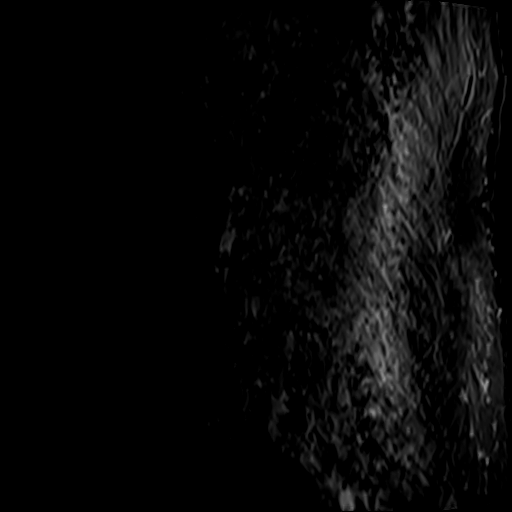
[im 6/19]
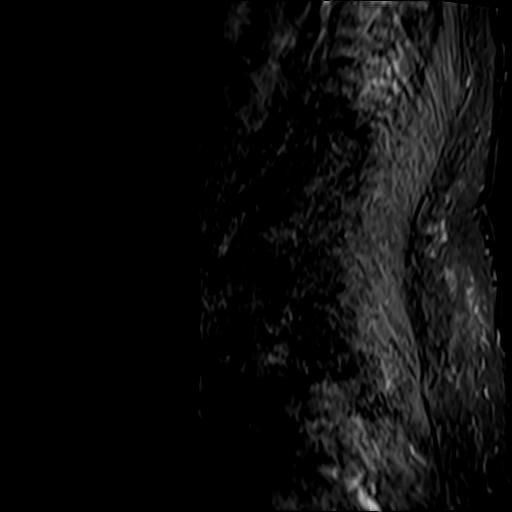
[im 9/19]
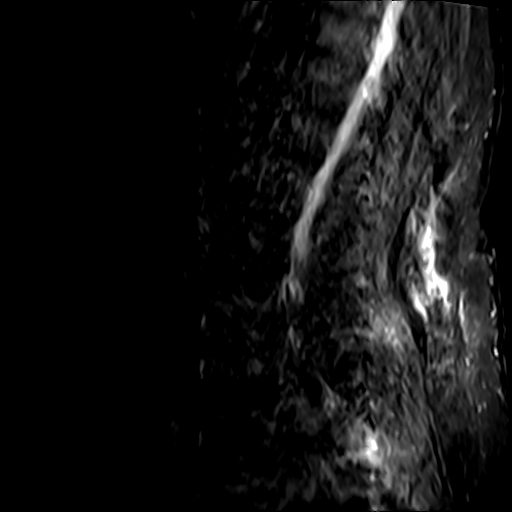
[im 10/19]
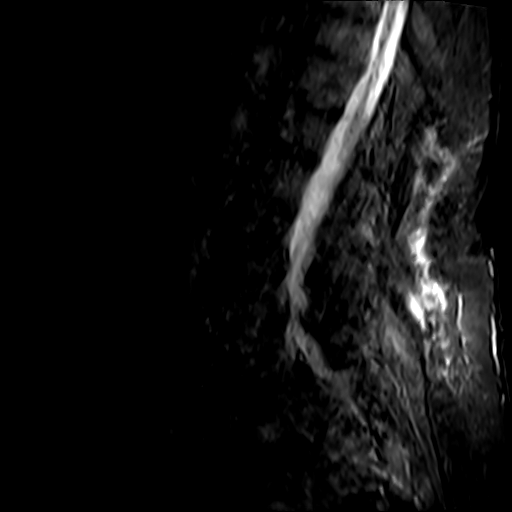
[im 11/19]
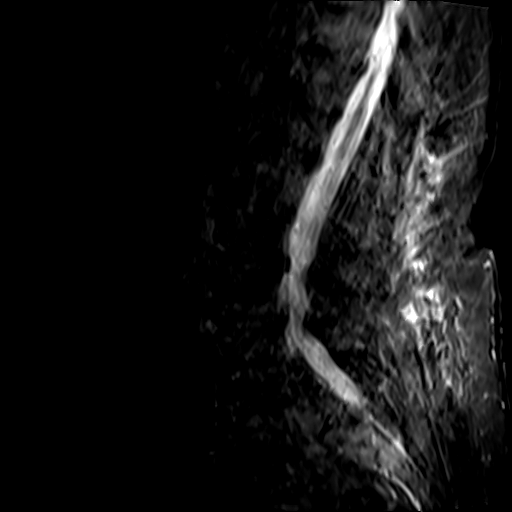
[im 14/19]
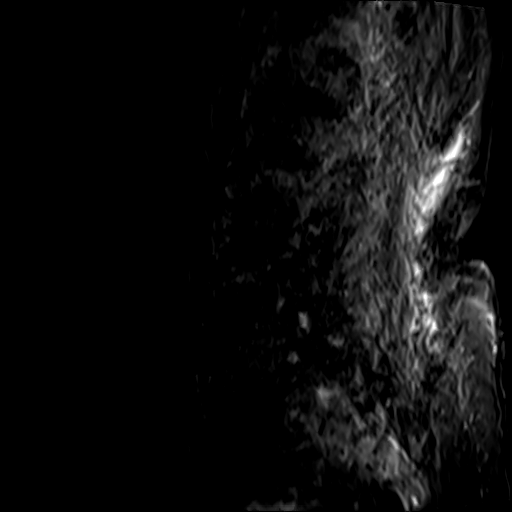
[im 16/19]
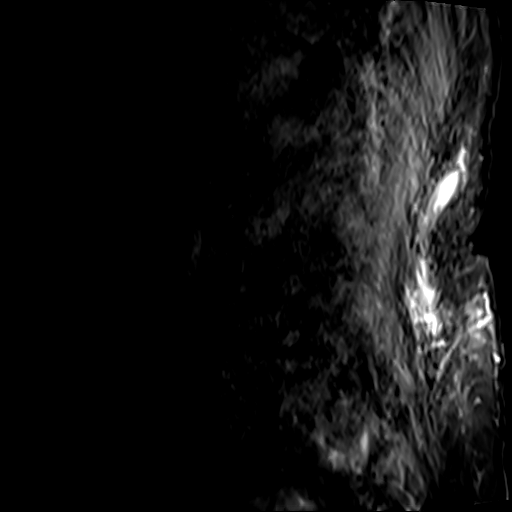
[im 18/19]
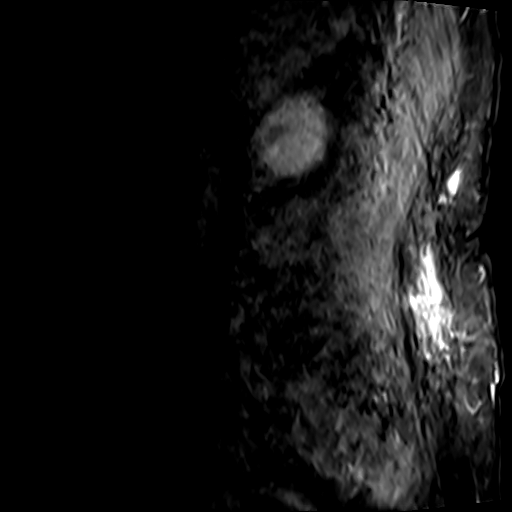

[29 of 38 positions shown; findings below may reference images not displayed]

FINDINGS: Segmentation: Examination is severely limited as the patient was
unable to tolerate the full length of the exam. Sagittal T2 and STIR
sequences only were performed. Additionally, the provided images are
severely degraded by motion artifact.

Transitional lumbosacral anatomy with sacralization of the L5
vertebral body and rudimentary L5-S1 interspace. Same numbering
system employed as on previous exam.

Alignment: Physiologic with preservation of the normal lumbar
lordosis. No interval listhesis or malalignment.

Vertebrae: Vertebral body height maintained without acute or chronic
fracture. Bone marrow signal intensity grossly within normal limits.
No visible discrete osseous lesions or abnormal marrow edema.

Conus medullaris and cauda equina: Conus extends to the L1 level.
Evaluation of the conus and cauda equina fairly limited on this
exam. No obvious abnormality.

Paraspinal and other soft tissues: Partially visualized paraspinous
soft tissues demonstrate no obvious abnormality.

Disc levels:

Multilevel degenerative disc disease and facet hypertrophy, most
pronounced at L2-3 and L3-4, grossly similar as compared to previous
exam. Associated mild-to-moderate spinal stenosis at L2-3 and L3-4,
grossly similar. No appreciable progressive stenosis on this limited
exam. Evaluation for foraminal encroachment limited on this
examination.
IMPRESSION: 1. Severely limited exam due to the patient's inability to tolerate
the full length of the exam and extensive motion.
2. No definite acute abnormality. Grossly stable appearance of
multilevel degenerative spondylosis with resultant mild to moderate
spinal stenosis at L2-3 and L3-4.
3. Transitional lumbosacral anatomy with sacralization of the L5
vertebral body.

If there remains clinical concern for possible occult pathology, a
repeat examination when the patient is able to tolerate the exam
would be recommended.

## 2022-01-03 MED ORDER — SODIUM CHLORIDE 0.9 % IV SOLN: 2.0000 g | Freq: Every day | INTRAVENOUS | Status: DC

## 2022-01-03 MED ORDER — ALBUTEROL SULFATE (2.5 MG/3ML) 0.083% IN NEBU
2.5000 mg | INHALATION_SOLUTION | Freq: Four times a day (QID) | RESPIRATORY_TRACT | Status: DC | PRN
Start: 2022-01-03 — End: 2022-01-03

## 2022-01-03 MED ORDER — ONDANSETRON 4 MG PO TBDP: 4.0000 mg | ORAL_TABLET | Freq: Four times a day (QID) | ORAL | Status: DC | PRN

## 2022-01-03 MED ORDER — THIAMINE HCL 100 MG/ML IJ SOLN
500.0000 mg | Freq: Three times a day (TID) | INTRAVENOUS | Status: DC
  Administered 2022-01-03: 500 mg via INTRAVENOUS
  Filled 2022-01-03 (×3): qty 5

## 2022-01-03 MED ORDER — LORAZEPAM 2 MG/ML PO CONC: 1.0000 mg | ORAL | Status: DC | PRN

## 2022-01-03 MED ORDER — GLYCOPYRROLATE 1 MG PO TABS: 1.0000 mg | ORAL_TABLET | ORAL | Status: DC | PRN

## 2022-01-03 MED ORDER — GLYCOPYRROLATE 0.2 MG/ML IJ SOLN
0.2000 mg | INTRAMUSCULAR | Status: DC | PRN
  Administered 2022-01-07 – 2022-01-08 (×4): 0.2 mg via INTRAVENOUS
  Filled 2022-01-03 (×4): qty 1

## 2022-01-03 MED ORDER — MORPHINE SULFATE (PF) 2 MG/ML IV SOLN
1.0000 mg | INTRAVENOUS | Status: DC | PRN
  Administered 2022-01-03 – 2022-01-05 (×5): 1 mg via INTRAVENOUS
  Filled 2022-01-03 (×5): qty 1

## 2022-01-03 MED ORDER — OXYCODONE HCL 20 MG/ML PO CONC
5.0000 mg | ORAL | Status: DC | PRN
  Administered 2022-01-03 – 2022-01-04 (×3): 5 mg via ORAL
  Filled 2022-01-03 (×2): qty 0.3
  Filled 2022-01-03 (×2): qty 0.5
  Filled 2022-01-03: qty 0.3

## 2022-01-03 MED ORDER — OXYCODONE HCL 20 MG/ML PO CONC: 5.0000 mg | ORAL | Status: DC | PRN

## 2022-01-03 MED ORDER — SODIUM CHLORIDE 0.9 % IV BOLUS
500.0000 mL | Freq: Once | INTRAVENOUS | Status: AC
  Administered 2022-01-03: 500 mL via INTRAVENOUS

## 2022-01-03 MED ORDER — PANTOPRAZOLE SODIUM 40 MG PO TBEC
40.0000 mg | DELAYED_RELEASE_TABLET | Freq: Every day | ORAL | Status: DC
  Administered 2022-01-03: 40 mg via ORAL
  Filled 2022-01-03: qty 1

## 2022-01-03 MED ORDER — LORAZEPAM 2 MG/ML IJ SOLN
1.0000 mg | Freq: Once | INTRAMUSCULAR | Status: AC
  Administered 2022-01-03: 1 mg via INTRAVENOUS
  Filled 2022-01-03: qty 1

## 2022-01-03 MED ORDER — LACTULOSE 10 GM/15ML PO SOLN
10.0000 g | Freq: Two times a day (BID) | ORAL | Status: DC
  Administered 2022-01-03: 10 g via ORAL
  Filled 2022-01-03: qty 15

## 2022-01-03 MED ORDER — ACETAMINOPHEN 650 MG RE SUPP: 650.0000 mg | Freq: Four times a day (QID) | RECTAL | Status: DC | PRN

## 2022-01-03 MED ORDER — GLYCOPYRROLATE 0.2 MG/ML IJ SOLN: 0.2000 mg | INTRAMUSCULAR | Status: DC | PRN

## 2022-01-03 MED ORDER — HALOPERIDOL 0.5 MG PO TABS: 0.5000 mg | ORAL_TABLET | ORAL | Status: DC | PRN

## 2022-01-03 MED ORDER — FENOFIBRATE 160 MG PO TABS
160.0000 mg | ORAL_TABLET | Freq: Every day | ORAL | Status: DC
  Filled 2022-01-03: qty 1

## 2022-01-03 MED ORDER — PROCHLORPERAZINE EDISYLATE 10 MG/2ML IJ SOLN
10.0000 mg | Freq: Four times a day (QID) | INTRAMUSCULAR | Status: DC | PRN
Start: 2022-01-03 — End: 2022-01-03

## 2022-01-03 MED ORDER — LORAZEPAM 2 MG/ML IJ SOLN: 1.0000 mg | INTRAMUSCULAR | Status: DC | PRN

## 2022-01-03 MED ORDER — HALOPERIDOL LACTATE 2 MG/ML PO CONC: 0.5000 mg | ORAL | Status: DC | PRN

## 2022-01-03 MED ORDER — POLYETHYLENE GLYCOL 3350 17 G PO PACK: 17.0000 g | PACK | Freq: Every day | ORAL | Status: DC | PRN

## 2022-01-03 MED ORDER — TRAZODONE HCL 50 MG PO TABS
25.0000 mg | ORAL_TABLET | Freq: Every evening | ORAL | Status: DC | PRN
  Administered 2022-01-03: 25 mg via ORAL
  Filled 2022-01-03: qty 1

## 2022-01-03 MED ORDER — POLYVINYL ALCOHOL 1.4 % OP SOLN: 1.0000 [drp] | Freq: Four times a day (QID) | OPHTHALMIC | Status: DC | PRN

## 2022-01-03 MED ORDER — ONDANSETRON HCL 4 MG/2ML IJ SOLN: 4.0000 mg | Freq: Four times a day (QID) | INTRAMUSCULAR | Status: DC | PRN

## 2022-01-03 MED ORDER — BIOTENE DRY MOUTH MT LIQD: 15.0000 mL | OROMUCOSAL | Status: DC | PRN

## 2022-01-03 MED ORDER — HALOPERIDOL LACTATE 5 MG/ML IJ SOLN
0.5000 mg | INTRAMUSCULAR | Status: DC | PRN
  Administered 2022-01-05: 0.5 mg via INTRAVENOUS
  Filled 2022-01-03: qty 1

## 2022-01-03 MED ORDER — CLOPIDOGREL BISULFATE 75 MG PO TABS
75.0000 mg | ORAL_TABLET | Freq: Every day | ORAL | Status: DC
Start: 2022-01-03 — End: 2022-01-03
  Administered 2022-01-03: 75 mg via ORAL
  Filled 2022-01-03: qty 1

## 2022-01-03 MED ORDER — ASPIRIN 81 MG PO TBEC
81.0000 mg | DELAYED_RELEASE_TABLET | Freq: Every day | ORAL | Status: DC
  Administered 2022-01-03: 81 mg via ORAL
  Filled 2022-01-03: qty 1

## 2022-01-03 MED ORDER — ACETAMINOPHEN 325 MG PO TABS
650.0000 mg | ORAL_TABLET | Freq: Four times a day (QID) | ORAL | Status: DC | PRN
  Filled 2022-01-03: qty 2

## 2022-01-03 MED ORDER — LACTULOSE 10 GM/15ML PO SOLN
10.0000 g | Freq: Two times a day (BID) | ORAL | Status: DC
  Filled 2022-01-03: qty 15

## 2022-01-03 MED ORDER — LORAZEPAM 1 MG PO TABS
1.0000 mg | ORAL_TABLET | ORAL | Status: DC | PRN
  Administered 2022-01-03: 1 mg via ORAL
  Filled 2022-01-03: qty 1

## 2022-01-03 MED ORDER — LACTULOSE ENEMA
300.0000 mL | Freq: Two times a day (BID) | RECTAL | Status: DC
Start: 2022-01-03 — End: 2022-01-03
  Filled 2022-01-03: qty 300

## 2022-01-03 MED ORDER — SODIUM CHLORIDE 0.9 % IV SOLN
2.0000 g | Freq: Every day | INTRAVENOUS | Status: DC
  Administered 2022-01-03: 2 g via INTRAVENOUS
  Filled 2022-01-03: qty 20

## 2022-01-03 NOTE — Progress Notes (Addendum)
Day 0 progress note:  76 y.o. 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  Patient lives at home with her grand daughter.  She initially came in as a code stroke due to reported inability to ambulate since after 5:40 PM.     Per family at bedside she has been progressively confused for the past 2 to 3 days with associated poor oral intake and poor appetite.  No subjective fevers reported.  No GI symptoms reported.  No seizure-like activities.  In the ED work-up was negative for acute CVA post noncontrast CT head and MRI brain.  UA was positive for pyuria. Seen by neurology/stroke team who recommended admission for further work-up. Noncontrast MRI thoracic spine and lumbar spine showed no acute findings.  Ammonia was elevated and started on lactulose initially. On rocephin for UTI. After lengthy discussion with daughter at bedside and patient. They would like to pursue hospice. She is not imminent therefore will not place on any drip. Comfort medications ordered. Palliative consulted for hospice placement at home versus facility. They would like to discontinue medical management now.The patient is already a DNR.

## 2022-01-03 NOTE — ED Notes (Signed)
Patient transported to MRI 

## 2022-01-03 NOTE — Plan of Care (Signed)
Came to assess patient and found that she had transitioned to comfort care.  Neurology will sign off but will remain available for questions or concerns.

## 2022-01-03 NOTE — Progress Notes (Signed)
EEG complete - results pending 

## 2022-01-03 NOTE — Procedures (Signed)
Patient Name: Tara [name]  MRN: [id number]  Epilepsy Attending: Charlsie Quest  Referring Physician/Provider: Darlin Drop, DO  Date: 24-Jan-2022 Duration: 21.34 mins  Patient history: 76 year old female with altered mental status.  EEG to evaluate for seizure.  Level of alertness: Awake  AEDs during EEG study: None  Technical aspects: This EEG study was done with scalp electrodes positioned according to the 10-20 International system of electrode placement. Electrical activity was acquired at a sampling rate of 500Hz  and reviewed with a high frequency filter of 70Hz  and a low frequency filter of 1Hz . EEG data were recorded continuously and digitally stored.   Description: No clear posterior dominant rhythm was seen. EEG showed continuous generalized 3 to 6 Hz theta- delta slowing.  Hyperventilation and photic stimulation were not performed.     ABNORMALITY - Continuous slow, generalized  IMPRESSION: This study is suggestive of moderate diffuse encephalopathy, nonspecific etiology. No seizures or epileptiform discharges were seen throughout the recording.  Quatavious Rossa 

## 2022-01-03 NOTE — ED Notes (Signed)
Per EEG tech patient had difficulty laying still for exam. Fredric Mare RN reached out to provider to see what we can do to help her get through her MRI exam

## 2022-01-03 NOTE — Progress Notes (Signed)
TRH night cross cover note:  I was notified by RN of the patient's continued agitation while noting that the patient will be undergoing MRI of the thoracic and lumbar spine shortly.   Per my chart review, this is a 76 year old female who was admitted earlier in the evening for acute encephalopathy of unclear source in the setting of generalized weakness with ensuing work-up notable for negative CT head/MRI brain.  In the setting of some concern for initial dysarthria as well as suggestion of diffuse limb weakness, neurology was also consulted earlier, with recs that included pursuit of MRI thoracic/lumbar spine, as above.   Regarding management of the patient's current agitation that includes pulling at lines, there there has been emphasis on verbal redirection thus far, particularly given ongoing evaluation via EEG, with some success with these conservative measures. However, as EEG monitoring is now complete (results currently pending), and as the patient will be going for MRI of the thoracic/lumbar spine shortly, will order Ativan 1 mg IV x 1 now to attend to the patient's current degree of agitation in order to help optimize the quality of the ensuing MRI studies.   Of note, VBG demonstrates no evidence of contributory acute hypercapnia, with results notable for the following: 7.31/46.    Newton Pigg, DO Hospitalist

## 2022-01-03 NOTE — ED Notes (Addendum)
Patient awakens and is alert spontaneously from a deep sleep. Patient moving arms and legs voluntarily and against gravity. Patient reports that she thinks that she had a bad dream and after adjusting herself she goes back to sleep. Patient awake and talking in full sentences which are hard to understand due to dysarthria and aphasia

## 2022-01-03 NOTE — ED Notes (Signed)
ED TO INPATIENT HANDOFF REPORT  S Name/Age/Gender Tara [name] 76 y.o. female Room/Bed: 006C/006C  Code Status   Code Status: DNR  Home/SNF/Other Home Patient oriented to: self Is this baseline? No   Triage Complete: Triage complete  Chief Complaint Generalized weakness [R53.1]  Triage Note Patient alert and verbal but appears very lethargic and having difficult speech (aphasia and diarthric). Patient has hx of stroke in the past. Patient reports that she was hallucinating yesterday which resolved today. Patient reports that she was walking yesterday and today at 1740 she was no longer able to walk or use her arms (per EMS per family members on scene). Patient verbalizes that she is normally able to walk and preform ADLs at home. Per EMS patient was not able to do those things today starting at 1740 witnessed by family.    Allergies No Known Allergies  Level of Care/Admitting Diagnosis ED Disposition     ED Disposition  Admit   Condition  --   Comment  Hospital [facility]: [facility] [[id number]]  Level of Care: Telemetry Medical [104]  May admit patient to [facility] or [facility] if equivalent level of care is available:: No  Covid Evaluation: Asymptomatic - no recent exposure (last 10 days) testing not required  Diagnosis: Generalized weakness [191478]  Admitting Physician: Darlin Drop [[id number]]  Attending Physician: Darlin Drop [[id number]]  Estimated length of stay: past midnight tomorrow  Certification:: I certify this patient will need inpatient services for at least 2 midnights          B Medical/Surgery History Past Medical History:  Diagnosis Date   Anxiety    Arthritis    Barrett's syndrome    Carpal tunnel syndrome    Depression    GERD (gastroesophageal reflux disease)    Heart murmur    Hyperlipidemia    Hypertension    Myocardial infarction (HCC)    08   PAD (peripheral artery disease) (HCC)    Stroke [facility])    Past  Surgical History:  Procedure Laterality Date   ABDOMINAL AORTOGRAM W/LOWER EXTREMITY N/A 07/28/2019   Procedure: ABDOMINAL AORTOGRAM W/LOWER EXTREMITY;  Surgeon: Iran Ouch, MD;  Location: [facility] INVASIVE CV LAB;  Service: Cardiovascular;  Laterality: N/A;   ARTERY BIOPSY Right 07/12/2019   Procedure: BIOPSY TEMPORAL ARTERY;  Surgeon: Henrene Dodge, MD;  Location: [facility] ORS;  Service: General;  Laterality: Right;   BREAST BIOPSY Left 2007   Neg- [place]   CARDIAC CATHETERIZATION     CARDIAC VALVE REPLACEMENT     CHOLECYSTECTOMY     COLON SURGERY     COLONOSCOPY WITH PROPOFOL N/A 04/09/2019   Procedure: COLONOSCOPY WITH PROPOFOL;  Surgeon: Pasty Spillers, MD;  Location: [facility] ENDOSCOPY;  Service: Endoscopy;  Laterality: N/A;   CORONARY ANGIOPLASTY WITH STENT PLACEMENT  2008   [place]; [facility] Stent placement x 1    DENTAL SURGERY     ESOPHAGOGASTRODUODENOSCOPY (EGD) WITH PROPOFOL N/A 04/09/2019   Procedure: ESOPHAGOGASTRODUODENOSCOPY (EGD) WITH PROPOFOL;  Surgeon: Pasty Spillers, MD;  Location: [facility] ENDOSCOPY;  Service: Endoscopy;  Laterality: N/A;   EXCISIONAL HEMORRHOIDECTOMY     EYE SURGERY     FEMORAL-POPLITEAL BYPASS GRAFT Right 08/02/2019   Procedure: BYPASS GRAFT FEMORAL-POPLITEAL ARTERY;  Surgeon: Larina Earthly, MD;  Location: [facility] OR;  Service: Vascular;  Laterality: Right;   HERNIA REPAIR     LOWER EXTREMITY ANGIOGRAPHY Left 11/11/2019   Procedure: LOWER EXTREMITY ANGIOGRAPHY;  Surgeon: Wyn Quaker,  Marlow Baars, MD;  Location: [facility] INVASIVE CV LAB;  Service: Cardiovascular;  Laterality: Left;   MULTIPLE TOOTH EXTRACTIONS     TONSILLECTOMY     TOTAL ABDOMINAL HYSTERECTOMY       A IV Location/Drains/Wounds Patient Lines/Drains/Airways Status     Active Line/Drains/Airways     Name Placement date Placement time Site Days   Peripheral IV 01-21-22 20 G Anterior;Left;Proximal Forearm January 21, 2022  2005  Forearm  1            Intake/Output Last 24  hours  Intake/Output Summary (Last 24 hours) at 01/03/2022 1324 Last data filed at 01/03/2022 0152 Gross per 24 hour  Intake 1500 ml  Output --  Net 1500 ml    Labs/Imaging Results for orders placed or performed during the hospital encounter of 01/21/22 (from the past 48 hour(s))  Protime-INR     Status: None   Collection Time: 01/21/22  7:31 PM  Result Value Ref Range   Prothrombin Time 13.5 11.4 - 15.2 seconds   INR 1.0 0.8 - 1.2    Comment: (NOTE) INR goal varies based on device and disease states. Performed at [facility], [address] [place], [place] [address]   APTT     Status: None   Collection Time: 01-21-22  7:31 PM  Result Value Ref Range   aPTT 27 24 - 36 seconds    Comment: Performed at [facility], [address], [place], [place] [address]  CBC     Status: Abnormal   Collection Time: 01-21-22  7:31 PM  Result Value Ref Range   WBC 6.0 4.0 - 10.5 K/uL   RBC 4.49 3.87 - 5.11 MIL/uL   Hemoglobin 11.3 (L) 12.0 - 15.0 g/dL   HCT 09.8 11.9 - 14.7 %   MCV 84.2 80.0 - 100.0 fL   MCH 25.2 (L) 26.0 - 34.0 pg   MCHC 29.9 (L) 30.0 - 36.0 g/dL   RDW 82.9 (H) 56.2 - 13.0 %   Platelets 181 150 - 400 K/uL   nRBC 0.0 0.0 - 0.2 %    Comment: Performed at [facility], [address] [place], [place] [address]  Differential     Status: None   Collection Time: 01/21/22  7:31 PM  Result Value Ref Range   Neutrophils Relative % 75 %   Neutro Abs 4.5 1.7 - 7.7 K/uL   Lymphocytes Relative 17 %   Lymphs Abs 1.1 0.7 - 4.0 K/uL   Monocytes Relative 7 %   Monocytes Absolute 0.4 0.1 - 1.0 K/uL   Eosinophils Relative 1 %   Eosinophils Absolute 0.0 0.0 - 0.5 K/uL   Basophils Relative 0 %   Basophils Absolute 0.0 0.0 - 0.1 K/uL   Immature Granulocytes 0 %   Abs Immature Granulocytes 0.02 0.00 - 0.07 K/uL    Comment: Performed at [facility], [address], [place], [place] [address]  Comprehensive metabolic panel     Status: Abnormal   Collection  Time: 01/21/22  7:31 PM  Result Value Ref Range   Sodium 141 135 - 145 mmol/L   Potassium 3.7 3.5 - 5.1 mmol/L   Chloride 111 98 - 111 mmol/L   CO2 22 22 - 32 mmol/L   Glucose, Bld 197 (H) 70 - 99 mg/dL    Comment: Glucose reference range applies only to samples taken after fasting for at least 8 hours.   BUN 38 (H) 8 - 23  mg/dL   Creatinine, Ser 3.76 (H) 0.44 - 1.00 mg/dL   Calcium 9.0 8.9 - 28.3 mg/dL   Total Protein 5.9 (L) 6.5 - 8.1 g/dL   Albumin 3.5 3.5 - 5.0 g/dL   AST 24 15 - 41 U/L   ALT 15 0 - 44 U/L   Alkaline Phosphatase 47 38 - 126 U/L   Total Bilirubin 0.7 0.3 - 1.2 mg/dL   GFR, Estimated 35 (L) >60 mL/min    Comment: (NOTE) Calculated using the CKD-EPI Creatinine Equation (2021)    Anion gap 8 5 - 15    Comment: Performed at [facility], [address] [place], [place] [address]  CBG monitoring, ED     Status: Abnormal   Collection Time: 12/20/2021  7:32 PM  Result Value Ref Range   Glucose-Capillary 175 (H) 70 - 99 mg/dL    Comment: Glucose reference range applies only to samples taken after fasting for at least 8 hours.  Ethanol     Status: None   Collection Time: 12/20/2021  7:33 PM  Result Value Ref Range   Alcohol, Ethyl (B) <10 <10 mg/dL    Comment: (NOTE) Lowest detectable limit for serum alcohol is 10 mg/dL.  For medical purposes only. Performed at [facility], [address], [place], [place] [address]   I-stat chem 8, ED     Status: Abnormal   Collection Time: 01/13/2022  7:37 PM  Result Value Ref Range   Sodium 142 135 - 145 mmol/L   Potassium 3.6 3.5 - 5.1 mmol/L   Chloride 111 98 - 111 mmol/L   BUN 37 (H) 8 - 23 mg/dL   Creatinine, Ser 7.10 (H) 0.44 - 1.00 mg/dL   Glucose, Bld 626 (H) 70 - 99 mg/dL    Comment: Glucose reference range applies only to samples taken after fasting for at least 8 hours.   Calcium, Ion 1.16 1.15 - 1.40 mmol/L   TCO2 20 (L) 22 - 32 mmol/L   Hemoglobin 12.2 12.0 - 15.0 g/dL   HCT 94.8 54.6 - 27.0 %   Urinalysis, Routine w reflex microscopic Urine, In & Out Cath     Status: Abnormal   Collection Time: 01/11/2022 10:48 PM  Result Value Ref Range   Color, Urine YELLOW YELLOW   APPearance HAZY (A) CLEAR   Specific Gravity, Urine 1.025 1.005 - 1.030   pH 5.0 5.0 - 8.0   Glucose, UA NEGATIVE NEGATIVE mg/dL   Hgb urine dipstick NEGATIVE NEGATIVE   Bilirubin Urine NEGATIVE NEGATIVE   Ketones, ur NEGATIVE NEGATIVE mg/dL   Protein, ur 30 (A) NEGATIVE mg/dL   Nitrite NEGATIVE NEGATIVE   Leukocytes,Ua LARGE (A) NEGATIVE   RBC / HPF 0-5 0 - 5 RBC/hpf   WBC, UA >50 (H) 0 - 5 WBC/hpf   Bacteria, UA RARE (A) NONE SEEN   Squamous Epithelial / LPF 6-10 0 - 5   Mucus PRESENT    Hyaline Casts, UA PRESENT    Non Squamous Epithelial 0-5 (A) NONE SEEN    Comment: Performed at [facility], [address], [place], [place] [address]  CBG monitoring, ED     Status: Abnormal   Collection Time: 01/03/22 12:01 AM  Result Value Ref Range   Glucose-Capillary 134 (H) 70 - 99 mg/dL    Comment: Glucose reference range applies only to samples taken after fasting for at least 8 hours.  Hemoglobin A1c     Status: Abnormal  Collection Time: 01/03/22 12:15 AM  Result Value Ref Range   Hgb A1c MFr Bld 7.4 (H) 4.8 - 5.6 %    Comment: (NOTE) Pre diabetes:          5.7%-6.4%  Diabetes:              >6.4%  Glycemic control for   <7.0% adults with diabetes    Mean Plasma Glucose 165.68 mg/dL    Comment: Performed at [facility], [address] [place], [place] [address]  CBC with Differential/Platelet     Status: Abnormal   Collection Time: 01/03/22 12:15 AM  Result Value Ref Range   WBC 5.0 4.0 - 10.5 K/uL   RBC 4.13 3.87 - 5.11 MIL/uL   Hemoglobin 10.5 (L) 12.0 - 15.0 g/dL   HCT 60.434.8 (L) 54.036.0 - 98.146.0 %   MCV 84.3 80.0 - 100.0 fL   MCH 25.4 (L) 26.0 - 34.0 pg   MCHC 30.2 30.0 - 36.0 g/dL   RDW 19.117.0 (H) 47.811.5 - 29.515.5 %   Platelets 181 150 - 400 K/uL   nRBC 0.0 0.0 - 0.2 %   Neutrophils  Relative % 65 %   Neutro Abs 3.3 1.7 - 7.7 K/uL   Lymphocytes Relative 27 %   Lymphs Abs 1.3 0.7 - 4.0 K/uL   Monocytes Relative 7 %   Monocytes Absolute 0.4 0.1 - 1.0 K/uL   Eosinophils Relative 1 %   Eosinophils Absolute 0.0 0.0 - 0.5 K/uL   Basophils Relative 0 %   Basophils Absolute 0.0 0.0 - 0.1 K/uL   Immature Granulocytes 0 %   Abs Immature Granulocytes 0.01 0.00 - 0.07 K/uL    Comment: Performed at [facility], [address] [place], [place] [address]  Comprehensive metabolic panel     Status: Abnormal   Collection Time: 01/03/22 12:15 AM  Result Value Ref Range   Sodium 143 135 - 145 mmol/L   Potassium 3.8 3.5 - 5.1 mmol/L   Chloride 113 (H) 98 - 111 mmol/L   CO2 22 22 - 32 mmol/L   Glucose, Bld 123 (H) 70 - 99 mg/dL    Comment: Glucose reference range applies only to samples taken after fasting for at least 8 hours.   BUN 39 (H) 8 - 23 mg/dL   Creatinine, Ser 8.651.60 (H) 0.44 - 1.00 mg/dL   Calcium 8.5 (L) 8.9 - 10.3 mg/dL   Total Protein 5.6 (L) 6.5 - 8.1 g/dL   Albumin 3.2 (L) 3.5 - 5.0 g/dL   AST 21 15 - 41 U/L   ALT 16 0 - 44 U/L   Alkaline Phosphatase 42 38 - 126 U/L   Total Bilirubin 0.6 0.3 - 1.2 mg/dL   GFR, Estimated 34 (L) >60 mL/min    Comment: (NOTE) Calculated using the CKD-EPI Creatinine Equation (2021)    Anion gap 8 5 - 15    Comment: Performed at [facility], [address] [place], [place] [address]  Magnesium     Status: None   Collection Time: 01/03/22 12:15 AM  Result Value Ref Range   Magnesium 2.0 1.7 - 2.4 mg/dL    Comment: Performed at [facility], [address] [place], [place] [address]  Phosphorus     Status: None   Collection Time: 01/03/22 12:15 AM  Result Value Ref Range   Phosphorus 4.2 2.5 - 4.6 mg/dL    Comment: Performed at [facility],  [address] [place], [place] [address]  TSH     Status: None   Collection Time: 01/03/22 12:15 AM  Result Value Ref Range   TSH 1.093 0.350 - 4.500 uIU/mL     Comment: Performed by a 3rd Generation assay with a functional sensitivity of <=0.01 uIU/mL. Performed at [facility], [address] [place], [place] [address]   Ammonia     Status: Abnormal   Collection Time: 01/03/22  1:34 AM  Result Value Ref Range   Ammonia 45 (H) 9 - 35 umol/L    Comment: Performed at [facility], [address] [place], [place] [address]  Blood gas, venous     Status: Abnormal   Collection Time: 01/03/22  1:39 AM  Result Value Ref Range   pH, Ven 7.31 7.25 - 7.43   pCO2, Ven 46 44 - 60 mmHg   pO2, Ven 39 32 - 45 mmHg   Bicarbonate 23.1 20.0 - 28.0 mmol/L   Acid-base deficit 3.6 (H) 0.0 - 2.0 mmol/L   O2 Saturation 68.6 %   Patient temperature 36.4    Collection site VEIN    Drawn by DRAWN BY RN     Comment: Performed at [facility], [address], [place], [place] [address]  CBG monitoring, ED     Status: Abnormal   Collection Time: 01/03/22  7:22 AM  Result Value Ref Range   Glucose-Capillary 103 (H) 70 - 99 mg/dL    Comment: Glucose reference range applies only to samples taken after fasting for at least 8 hours.  CBG monitoring, ED     Status: Abnormal   Collection Time: 01/03/22 11:18 AM  Result Value Ref Range   Glucose-Capillary 114 (H) 70 - 99 mg/dL    Comment: Glucose reference range applies only to samples taken after fasting for at least 8 hours.   US Abdomen Limited RUQ (LIVER/GB)  Result Date: 01/03/2022 CLINICAL DATA:  Abdominal pain EXAM: ULTRASOUND ABDOMEN LIMITED RIGHT UPPER QUADRANT COMPARISON:  None Available. FINDINGS: Gallbladder: Gallbladder is not seen consistent with cholecystectomy. Common bile duct: Diameter: 8.7 mm Liver: There is increased echogenicity suggesting fatty infiltration. No focal abnormality is seen in the visualized portions of liver. Portal vein is patent on color Doppler imaging with normal direction of blood flow towards the liver. Other: None. IMPRESSION: Fatty liver. Status post  cholecystectomy. No other sonographic abnormality is seen in the right upper quadrant. Electronically Signed   By: Ernie Avena M.D.   On: 01/03/2022 10:17   EEG adult  Result Date: 01/03/2022 Charlsie Quest, MD     01/03/2022  8:24 AM Patient Name: Zykiria Bruening Isaacks MRN: [id number] Epilepsy Attending: Charlsie Quest Referring Physician/Provider: Darlin Drop, DO Date: 02/01/22 Duration: 21.34 mins Patient history: 76 year old female with altered mental status.  EEG to evaluate for seizure. Level of alertness: Awake AEDs during EEG study: None Technical aspects: This EEG study was done with scalp electrodes positioned according to the 10-20 International system of electrode placement. Electrical activity was acquired at a sampling rate of  and reviewed with a high frequency filter of  and a low frequency filter of . EEG data were recorded continuously and digitally stored. Description: No clear posterior dominant rhythm was seen. EEG showed continuous generalized 3 to[address] theta- delta slowing.  Hyperventilation and photic stimulation were not performed.   ABNORMALITY - Continuous slow, generalized IMPRESSION: This study is suggestive of moderate diffuse encephalopathy,  nonspecific etiology. No seizures or epileptiform discharges were seen throughout the recording. Charlsie Quest   MR LUMBAR SPINE WO CONTRAST  Result Date: 01/03/2022 CLINICAL DATA:  Initial evaluation for acute encephalopathy, generalized weakness. EXAM: MRI LUMBAR SPINE WITHOUT CONTRAST TECHNIQUE: Multiplanar, multisequence MR imaging of the lumbar spine was performed. No intravenous contrast was administered. COMPARISON:  Prior MRI from 01/08/2021. FINDINGS: Segmentation: Examination is severely limited as the patient was unable to tolerate the full length of the exam. Sagittal T2 and STIR sequences only were performed. Additionally, the provided images are severely degraded by motion artifact. 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 Same numbering system employed as on previous exam. Alignment: Physiologic with preservation of the normal lumbar lordosis. No interval listhesis or malalignment. Vertebrae: Vertebral body height maintained without acute or chronic fracture. Bone marrow signal intensity grossly within normal limits. No visible discrete osseous lesions or abnormal marrow edema. Conus medullaris and cauda equina: Conus extends to the L1 level. Evaluation of the conus and cauda equina fairly limited on this exam. No obvious abnormality. Paraspinal and other soft tissues: Partially visualized paraspinous soft tissues demonstrate no obvious abnormality. Disc levels: Multilevel degenerative disc disease and facet hypertrophy, most pronounced at L2-3 and L3-4, grossly similar as compared to previous exam. Associated mild-to-moderate spinal stenosis at L2-3 and L3-4, grossly similar. No appreciable progressive stenosis on this limited exam. Evaluation for foraminal encroachment limited on this examination. IMPRESSION: 1. Severely limited exam due to the patient's inability to tolerate the full length of the exam and extensive motion. 2. No definite acute abnormality. Grossly stable appearance of multilevel degenerative spondylosis with resultant mild to moderate spinal stenosis at L2-3 and L3-4. 3. 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 If there remains clinical concern for possible occult pathology, a repeat examination when the patient is able to tolerate the exam would be recommended. Electronically Signed   By: Rise Mu M.D.   On: 01/03/2022 04:31   MR THORACIC SPINE WO CONTRAST  Result Date: 01/03/2022 CLINICAL DATA:  Initial evaluation for acute encephalopathy with generalized weakness. EXAM: MRI THORACIC SPINE WITHOUT CONTRAST TECHNIQUE: Multiplanar, multisequence MR imaging of the  thoracic spine was performed. No intravenous contrast was administered. COMPARISON:  Previous exam from 01/08/2021. FINDINGS: Alignment: Examination is technically limited as the patient was unable to tolerate the full length of the exam. No counter sequence is provided, and vertebral body count is made in comparison with previous exam and prior cervical spine MRI from Jan 25, 2022. Additionally, axial GRE sequence was not performed either. The images at are provided are moderately degraded by motion. Mild exaggeration of the normal thoracic kyphosis. No significant listhesis. Vertebrae: Vertebral body height maintained without acute or chronic fracture. Bone marrow signal intensity within normal limits. Small benign hemangioma noted within the T11 vertebral body. No worrisome osseous lesions. No abnormal marrow edema. Cord: Grossly normal signal and morphology. No appreciable cord signal changes on this motion degraded and limited exam. Paraspinal and other soft tissues: Unremarkable. Disc levels: T1-2: Unremarkable. T2-3: Negative interspace. Mild left-sided facet hypertrophy. No stenosis. T3-4: Unremarkable. T4-5: Unremarkable. T5-6: Small right paracentral disc protrusion indents the ventral thecal sac. No significant spinal stenosis or cord deformity. Foramina remain patent. T6-7: Disc bulge with small right paracentral disc protrusion. Reactive endplate spurring. No significant spinal stenosis. Foramina remain patent. T7-8: Left paracentral disc protrusion indents the left ventral thecal sac, contacting and minimally flattening the left ventral  cord. No cord signal changes. No significant spinal stenosis. Foramina remain patent. T8-9: Small left paracentral disc protrusion indents the left ventral thecal sac. Minimal flattening of the left ventral cord without cord signal changes or significant spinal stenosis. Foramina remain patent. T9-10: Chronic endplate Schmorl's node deformity without significant disc  bulge. No stenosis. T10-11: Disc bulge with small central disc protrusion. Mild facet hypertrophy. No significant spinal stenosis. Foramina remain patent. T11-12: Unremarkable. T12-L1: Seen only on sagittal projection. Small central disc protrusion without significant spinal stenosis. Foramina remain patent. IMPRESSION: 1. Technically limited exam due to the patient's inability to tolerate the full length of the study and motion artifact. 2. Grossly normal MRI appearance of the thoracic spinal cord. 3. Small disc protrusions at T5-6 through T10-11 as above. No significant spinal stenosis. Electronically Signed   By: Rise Mu M.D.   On: 01/03/2022 04:24   MR BRAIN WO CONTRAST  Result Date: 12/23/2021 CLINICAL DATA:  Initial evaluation for neuro deficit, stroke suspected. EXAM: MRI HEAD WITHOUT CONTRAST TECHNIQUE: Multiplanar, multiecho pulse sequences of the brain and surrounding structures were obtained without intravenous contrast. COMPARISON:  Prior CT from earlier the same day. FINDINGS: Brain: Generalized age-related cerebral atrophy. Patchy and confluent T2/FLAIR hyperintensity involving the periventricular deep white matter both cerebral hemispheres, most consistent with chronic small vessel ischemic disease, moderately advanced in nature. Associated scattered remote lacunar infarcts about the deep gray nuclei. Multifocal areas of encephalomalacia and gliosis involving the anterior right frontal lobe, parasagittal posterior left frontal lobe, and bilateral parieto-occipital regions, consistent with chronic ischemic infarcts. Few additional small remote bilateral cerebellar infarcts. Scattered chronic blood products noted about a few of these chronic ischemic changes. No abnormal foci of restricted diffusion to suggest acute or subacute ischemia. Gray-white matter differentiation maintained. No other acute or chronic intracranial blood products. No mass lesion, midline shift or mass effect. No  hydrocephalus or extra-axial fluid collection. Pituitary gland suprasellar region within normal limits. Vascular: Major intracranial vascular flow voids are maintained. Skull and upper cervical spine: Craniocervical junction with normal limits. Bone marrow signal intensity normal. No scalp soft tissue abnormality. Sinuses/Orbits: Prior bilateral ocular lens replacement. Paranasal sinuses and mastoid air cells are largely clear. Other: None. IMPRESSION: 1. No acute intracranial abnormality. 2. Underlying age-related cerebral atrophy with moderate chronic small vessel ischemic disease, with multiple remote ischemic infarcts as above. Electronically Signed   By: Rise Mu M.D.   On: 12/24/2021 22:54   MR Cervical Spine Wo Contrast  Result Date: 01/13/2022 CLINICAL DATA:  Initial evaluation for non accidental trauma suspected, abnormal neuro exam. EXAM: MRI CERVICAL SPINE WITHOUT CONTRAST TECHNIQUE: Multiplanar, multisequence MR imaging of the cervical spine was performed. No intravenous contrast was administered. COMPARISON:  None available. FINDINGS: Alignment: Examination degraded by motion artifact. Straightening with mild reversal of the normal cervical lordosis. Trace anterolisthesis of C3 on C4 and C4 on C5, likely chronic and degenerative. Vertebrae: Vertebral body height maintained without acute or chronic fracture. Bone marrow signal intensity within normal limits. No discrete or worrisome osseous lesions. Minimal reactive marrow edema present about the right C3-4 facet due to facet arthritis. Mild discogenic reactive endplate change present about the C5-6 interspace. No other abnormal marrow edema. Cord: Normal signal morphology. Posterior Fossa, vertebral arteries, paraspinal tissues: Atrophy with chronic ischemic changes noted within the partially visualized brain. Paraspinous soft tissues within normal limits. Right vertebral artery hypoplastic and not well seen, possibly occluded within the  neck. Normal preserved flow voids seen within the dominant  left vertebral artery. Disc levels: C2-C3: Small central disc protrusion indents the ventral thecal sac. Moderate left-sided facet hypertrophy. No spinal stenosis. Mild left C3 foraminal narrowing. Right neural foramen remains patent. C3-C4: Trace anterolisthesis. Broad-based right paracentral disc protrusion flattens and indents the right ventral thecal sac (series 5, image 32). Resultant mild spinal stenosis. Superimposed right worse than left uncovertebral and facet hypertrophy. Moderate right C4 foraminal stenosis. Left neural foramen remains patent. C4-C5: Degenerative intervertebral disc space narrowing. Right paracentral disc osteophyte complex flattens and indents the ventral thecal sac. Mild cord flattening without cord signal changes. Mild to moderate spinal stenosis. Superimposed right greater than left uncovertebral and facet hypertrophy with resultant moderate right C5 foraminal stenosis. Left neural foramina remains grossly patent. C5-C6: Degenerative intervertebral disc space narrowing with circumferential disc osteophyte complex. Broad posterior component indents and flattens the ventral thecal sac. Moderate spinal stenosis with mild cord flattening but no cord signal changes. Severe right with moderate left C6 foraminal narrowing. C6-C7: Degenerative intervertebral disc space narrowing. Right paracentral disc osteophyte complex indents and partially effaces the ventral thecal sac. Mild cord flattening without cord signal changes. Mild spinal stenosis. Moderate right worse than left C7 foraminal narrowing. C7-T1: Negative interspace. Mild facet hypertrophy. No canal or foraminal stenosis. Visualized upper thoracic spine demonstrates no significant finding. IMPRESSION: 1. No acute abnormality within the cervical spine. 2. Multilevel cervical spondylosis with resultant mild to moderate diffuse spinal stenosis at C3-4 through C6-7. 3.  Multifactorial degenerative changes with resultant multilevel foraminal narrowing as above. Notable findings include moderate right C4 and C5 foraminal stenosis, severe right with moderate left C6 foraminal narrowing, with moderate right worse than left C7 foraminal stenosis. 4. 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 Preserved flow void within the dominant left vertebral artery. Electronically Signed   By: Rise Mu M.D.   On: 2022-01-11 22:40   CT HEAD CODE STROKE WO CONTRAST  Result Date: 01/11/22 CLINICAL DATA:  Code stroke. Provided history: Neuro deficit, acute, stroke suspected. EXAM: CT HEAD WITHOUT CONTRAST TECHNIQUE: Contiguous axial images were obtained from the base of the skull through the vertex without intravenous contrast. RADIATION DOSE REDUCTION: This exam was performed according to the departmental dose-optimization program which includes automated exposure control, adjustment of the mA and/or kV according to patient size and/or use of iterative reconstruction technique. COMPARISON:  Brain MRI 01/07/2021. head CT 11/02/2021. FINDINGS: Brain: Mild generalized parenchymal atrophy. Redemonstrated chronic cortical/subcortical infarcts within the posteromedial left frontal lobe and bilateral parietooccipital lobes. Chronic lacunar infarcts within the bilateral basal ganglia, thalami and cerebellar hemispheres, some of which were better appreciated on the prior brain MRI of 01/07/2021. Background mild patchy and ill-defined hypoattenuation within the cerebral white matter, nonspecific but compatible with chronic small vessel ischemic disease. There is no acute intracranial hemorrhage. No acute demarcated cortical infarct. No extra-axial fluid collection. No evidence of an intracranial mass. No midline shift. Vascular: No hyperdense vessel. Atherosclerotic calcifications. Skull: No fracture or aggressive osseous lesion.  Sinuses/Orbits: No mass or acute finding within the imaged orbits. Minimal mucosal thickening within the bilateral ethmoid and right maxillary sinuses. ASPECTS [facility] Stroke Program Early CT Score) - Ganglionic level infarction (caudate, lentiform nuclei, internal capsule, insula, M1-M3 cortex): 7 - Supraganglionic infarction (M4-M6 cortex): 3 Total score (0-10 with 10 being normal): 10 (when discounting chronic infarcts). These results were communicated to Dr. Otelia Limes at 8:01 pmon 06-23-23by text page via the [facility] messaging system. IMPRESSION: No evidence of acute intracranial  abnormality. Parenchymal atrophy, chronic small vessel ischemic disease and multiple chronic infarcts as described and not appreciably changed from the prior brain MRI of 01/07/2021. Electronically Signed   By: Jackey Loge D.O.   On: January 27, 2022 20:02    Pending Labs Unresulted Labs (From admission, onward)     Start     Ordered   01/04/22 0500  CBC with Differential/Platelet  Tomorrow morning,   R        01/03/22 0053   01/04/22 0500  Comprehensive metabolic panel  Tomorrow morning,   R        01/03/22 0053   01/04/22 0500  Magnesium  Tomorrow morning,   R        01/03/22 0053   01/04/22 0500  Phosphorus  Tomorrow morning,   R        01/03/22 0053   01/03/22 0456  Vitamin B1  ONCE - URGENT,   URGENT        01/03/22 0455   01/03/22 0052  Urine Culture  (Urine Culture)  ONCE - URGENT,   URGENT       Question:  Indication  Answer:  Altered mental status (if no other cause identified)   01/03/22 0051            Vitals/Pain Today's Vitals   01/03/22 1200 01/03/22 1215 01/03/22 1230 01/03/22 1300  BP: (!) 141/74 135/79 (!) 144/81 117/79  Pulse: 73 72 71 70  Resp: 15 (!) 9 11 10   Temp:      TempSrc:      SpO2: 93% 95% 95% 94%  Weight:      Height:      PainSc:        Isolation Precautions No active isolations  Medications Medications  enoxaparin (LOVENOX) injection 40 mg (40 mg Subcutaneous Given  01/03/22 0915)  acetaminophen (TYLENOL) tablet 650 mg (has no administration in time range)  insulin aspart (novoLOG) injection 0-5 Units ( Subcutaneous Not Given 01/03/22 0002)  insulin aspart (novoLOG) injection 0-9 Units ( Subcutaneous Not Given 01/03/22 1119)  lactated ringers infusion ( Intravenous Restarted 01/03/22 0954)  cefTRIAXone (ROCEPHIN) 2 g in sodium chloride 0.9 % 100 mL IVPB (0 g Intravenous Stopped 01/03/22 0954)  albuterol (PROVENTIL) (2.5 MG/3ML) 0.083% nebulizer solution 2.5 mg (has no administration in time range)  aspirin EC tablet 81 mg (81 mg Oral Given 01/03/22 1024)  clopidogrel (PLAVIX) tablet 75 mg (75 mg Oral Given 01/03/22 1024)  fenofibrate tablet 160 mg (0 mg Oral Hold 01/03/22 0905)  pantoprazole (PROTONIX) EC tablet 40 mg (40 mg Oral Given 01/03/22 1024)  prochlorperazine (COMPAZINE) injection 10 mg (has no administration in time range)  polyethylene glycol (MIRALAX / GLYCOLAX) packet 17 g (has no administration in time range)  thiamine 500mg  in normal saline (90ml) IVPB (0 mg Intravenous Stopped 01/03/22 0809)  lactulose (CHRONULAC) 10 GM/15ML solution 10 g (10 g Oral Given 01/03/22 1036)  sodium chloride flush (NS) 0.9 % injection 3 mL (3 mLs Intravenous Given 2022-01-27 2016)  sodium chloride 0.9 % bolus 1,000 mL (0 mLs Intravenous Stopped 01/03/22 0152)  cefTRIAXone (ROCEPHIN) 1 g in sodium chloride 0.9 % 100 mL IVPB (0 g Intravenous Stopped 01/03/22 0100)  sodium chloride 0.9 % bolus 500 mL (0 mLs Intravenous Stopped 01/03/22 0144)  LORazepam (ATIVAN) injection 1 mg (1 mg Intravenous Given 01/03/22 0151)    Mobility non-ambulatory High fall risk   Focused Assessments Neuro Assessment Handoff:  Swallow screen pass? Yes    NIH  Stroke Scale ( + Modified Stroke Scale Criteria)  Interval: Neuro change Level of Consciousness (1a.)   : Alert, keenly responsive LOC Questions (1b. )   +: Answers neither question correctly LOC Commands (1c. )   + : Performs one task  correctly Best Gaze (2. )  +: Normal Visual (3. )  +: No visual loss Facial Palsy (4. )    : Minor paralysis Motor Arm, Left (5a. )   +: Drift Motor Arm, Right (5b. )   +: Drift Motor Leg, Left (6a. )   +: Some effort against gravity Motor Leg, Right (6b. )   +: Some effort against gravity Limb Ataxia (7. ): Absent Sensory (8. )   +: Normal, no sensory loss Best Language (9. )   +: Mild-to-moderate aphasia Dysarthria (10. ): Mild-to-moderate dysarthria, patient slurs at least some words and, at worst, can be understood with some difficulty Extinction/Inattention (11.)   +: No Abnormality Modified SS Total  +: 10 Complete NIHSS TOTAL: 3 Last date known well: 01/16/22 Last time known well: 1740 Neuro Assessment: Exceptions to WDL Neuro Checks:   Initial (2022-01-16 1930)  Last Documented NIHSS Modified Score: 10 (01/03/22 1130) Has TPA been given? No If patient is a Neuro Trauma and patient is going to OR before floor call report to 4N Charge nurse: [phone] or [phone]   R Recommendations: See Admitting Provider Note

## 2022-01-03 NOTE — ED Notes (Signed)
Granddaughter reports awareness of the update on the plan of care prior to departing the hospital

## 2022-01-03 NOTE — ED Notes (Signed)
RN reached out to Admitting team about the pt's increasing agitation and restlessness. New orders provided for pt to tolerate Mri

## 2022-01-03 NOTE — ED Notes (Signed)
PER MRI tech , patient had difficulty staying still throughout the exam and also had difficulty following directions

## 2022-01-03 NOTE — ED Notes (Signed)
EEG at bedside.

## 2022-01-03 NOTE — ED Notes (Signed)
Sitter order has been placed, as well as staffing/sitter notified 

## 2022-01-03 NOTE — ED Notes (Signed)
Pericare provided, bed linen has been changed, purewick has been applied

## 2022-01-03 NOTE — H&P (Addendum)
History and Physical  Tara [name] [id number] DOB: 03/26/1946 DOA: 12/24/2021  Referring physician: Dr. Almyra Free, [place]  PCP: Valerie Roys, DO  Outpatient Specialists: Neurology Patient coming from: Home  Chief Complaint: Altered mental status, generalized weakness   HPI: Tara [name] is a 76 y.o. female with medical history significant for prior CVA on DAPT, chronic pain of both knees on chronic opioid use, essential hypertension, hyperlipidemia, GERD, chronic anxiety/depression, who presented to [facility] ED from home via EMS due to generalized weakness and altered mental status.  Patient lives at home with her grand daughter.  She initially came in as a code stroke due to reported inability to ambulate since after 5:40 PM.    Per family at bedside she has been progressively confused for the past 2 to 3 days with associated poor oral intake and poor appetite.  No subjective fevers reported.  No GI symptoms reported.  No seizure-like activities.  In the ED work-up was negative for acute CVA post noncontrast CT head and MRI brain.  UA was positive for pyuria.  Seen by neurology/stroke team who recommended admission for further work-up.  Noncontrast MRI thoracic spine and lumbar spine are pending.  Very lethargic at the time of this exam.  Unable to obtain a history due to lethargy.  Her daughter is at bedside and states that the patient's CODE STATUS is DNR.  The patient was admitted by the hospitalist service.  ED Course: Tmax 97.3.  BP 82/53, pulse 64, respiratory 9, with saturation 91% on room air.  Lab studies remarkable for serum glucose 192, BUN 37, creatinine 1.50.  GFR 35.  WBC 6.0.  Hemoglobin 12.2, platelet 181.  Review of Systems: Review of systems as noted in the HPI. All other systems reviewed and are negative.   Past Medical History:  Diagnosis Date   Anxiety    Arthritis    Barrett's syndrome    Carpal tunnel syndrome    Depression    GERD (gastroesophageal reflux  disease)    Heart murmur    Hyperlipidemia    Hypertension    Myocardial infarction [facility])    08   PAD (peripheral artery disease) ([place])    Stroke [facility])    Past Surgical History:  Procedure Laterality Date   ABDOMINAL AORTOGRAM W/LOWER EXTREMITY N/A 07/28/2019   Procedure: ABDOMINAL AORTOGRAM W/LOWER EXTREMITY;  Surgeon: Wellington Hampshire, MD;  Location: [place] CV LAB;  Service: Cardiovascular;  Laterality: N/A;   ARTERY BIOPSY Right 07/12/2019   Procedure: BIOPSY TEMPORAL ARTERY;  Surgeon: Olean Ree, MD;  Location: [facility] ORS;  Service: General;  Laterality: Right;   BREAST BIOPSY Left 2007   [place] VALVE REPLACEMENT     CHOLECYSTECTOMY     COLON SURGERY     COLONOSCOPY WITH PROPOFOL N/A 04/09/2019   Procedure: COLONOSCOPY WITH PROPOFOL;  Surgeon: Virgel Manifold, MD;  Location: [facility] ENDOSCOPY;  Service: Endoscopy;  Laterality: N/A;   CORONARY ANGIOPLASTY WITH STENT PLACEMENT  2008   [place]; [facility] Stent placement x 1    DENTAL SURGERY     ESOPHAGOGASTRODUODENOSCOPY (EGD) WITH PROPOFOL N/A 04/09/2019   Procedure: ESOPHAGOGASTRODUODENOSCOPY (EGD) WITH PROPOFOL;  Surgeon: Virgel Manifold, MD;  Location: [facility] ENDOSCOPY;  Service: Endoscopy;  Laterality: N/A;   EXCISIONAL HEMORRHOIDECTOMY     EYE SURGERY     FEMORAL-POPLITEAL BYPASS GRAFT Right 08/02/2019   Procedure: BYPASS GRAFT FEMORAL-POPLITEAL ARTERY;  Surgeon: Donnetta Hutching,  Arvilla Meres, MD;  Location: [place];  Service: Vascular;  Laterality: Right;   HERNIA REPAIR     LOWER EXTREMITY ANGIOGRAPHY Left 11/11/2019   Procedure: LOWER EXTREMITY ANGIOGRAPHY;  Surgeon: Algernon Huxley, MD;  Location: [place] CV LAB;  Service: Cardiovascular;  Laterality: Left;   MULTIPLE TOOTH EXTRACTIONS     TONSILLECTOMY     TOTAL ABDOMINAL HYSTERECTOMY      Social History:  reports that she has been smoking cigarettes. She has a 0.75 pack-year smoking history. She has never used  smokeless tobacco. She reports that she does not drink alcohol and does not use drugs.   No Known Allergies  Family History  Adopted: Yes  Problem Relation Age of Onset   Breast cancer Mother 50      Prior to Admission medications   Medication Sig Start Date End Date Taking? Authorizing Provider  albuterol (VENTOLIN HFA) 108 (90 Base) MCG/ACT inhaler Inhale 2 puffs into the lungs every 6 (six) hours as needed for wheezing or shortness of breath. 08/19/21   Johnson, Megan P, DO  amLODipine (NORVASC) 10 MG tablet Take 1 tablet (10 mg total) by mouth daily. 08/19/21   Park Liter P, DO  aspirin EC 81 MG tablet Take 81 mg by mouth daily. Swallow whole.    [provider]  B-D TB SYRINGE 1CC/27GX1/2" 27G X 1/2" 1 ML MISC USE AS DIRECTED EVERY 30 DAYS 11/06/19   [provider]  baclofen (LIORESAL) 10 MG tablet Take by mouth. 06/30/21   [provider]  citalopram (CELEXA) 20 MG tablet Take 1.5 tablets (30 mg total) by mouth 2 (two) times daily. 10/08/21   Park Liter P, DO  clopidogrel (PLAVIX) 75 MG tablet Take 1 tablet (75 mg total) by mouth daily. 11/11/19   Algernon Huxley, MD  cyanocobalamin (,VITAMIN B-12,) 1000 MCG/ML injection Inject 1 mL (1,000 mcg total) into the muscle every 30 (thirty) days. 02/09/21   Park Liter P, DO  diclofenac Sodium (VOLTAREN) 1 % GEL Apply 4 g topically 4 (four) times daily. 01/12/21   Eugenie Filler, MD  diphenoxylate-atropine (LOMOTIL) 2.5-0.025 MG tablet Take 1 tablet by mouth 4 (four) times daily as needed for diarrhea or loose stools. 08/14/21   Johnson, Megan P, DO  donepezil (ARICEPT) 5 MG tablet Take 5 mg by mouth daily. 01/01/21   [provider]  fenofibrate micronized (LOFIBRA) 200 MG capsule TAKE 1 CAPSULE(200 MG) BY MOUTH DAILY 08/19/21   Johnson, Megan P, DO  fluticasone (FLONASE) 50 MCG/ACT nasal spray Place 1-2 sprays into both nostrils daily. 12/12/20   [provider]  Insulin Syringe-Needle U-100  (INSULIN SYRINGE 1CC/30GX5/16") 30G X 5/16" 1 ML MISC 1 each by Does not apply route every 30 (thirty) days. 02/09/21   Park Liter P, DO  isosorbide mononitrate (IMDUR) 60 MG 24 hr tablet Take 1 tablet (60 mg total) by mouth in the morning and at bedtime. 08/19/21   Johnson, Megan P, DO  lidocaine (LIDODERM) 5 % Place 2 patches onto the skin daily. 01/12/21   Eugenie Filler, MD  lisinopril (ZESTRIL) 40 MG tablet Take 1 tablet (40 mg total) by mouth 2 (two) times daily. 10/08/21   Park Liter P, DO  loperamide (IMODIUM) 2 MG capsule Take 2 mg by mouth as needed for diarrhea or loose stools.    [provider]  magic mouthwash (nystatin, lidocaine, diphenhydrAMINE, alum & mag hydroxide) suspension Swish and spit 5 mLs 3 (three) times daily  as needed for mouth pain. 10/08/21   Johnson, Megan P, DO  montelukast (SINGULAIR) 10 MG tablet Take 10 mg by mouth daily. 12/12/20   [provider]  naproxen (NAPROSYN) 375 MG tablet Take 1 tablet (375 mg total) by mouth 2 (two) times daily as needed for mild pain or moderate pain. 01/12/21   Eugenie Filler, MD  neomycin-bacitracin-polymyxin (NEOSPORIN) OINT Apply 1 application topically as needed for wound care. 01/12/21   Eugenie Filler, MD  nitroGLYCERIN (NITROSTAT) 0.4 MG SL tablet Place 1 tablet (0.4 mg total) under the tongue every 5 (five) minutes as needed for chest pain. 05/01/20   Loel Dubonnet, NP  nortriptyline (PAMELOR) 50 MG capsule Take 1 capsule (50 mg total) by mouth at bedtime. 08/19/21   Johnson, Megan P, DO  omeprazole (PRILOSEC) 40 MG capsule Take 1 capsule (40 mg total) by mouth 2 (two) times daily. TAKE 1 CAPSULE BY MOUTH TWICE DAILY 08/19/21   Park Liter P, DO  pregabalin (LYRICA) 150 MG capsule Take 1 capsule (150 mg total) by mouth 2 (two) times daily. 08/19/21   Johnson, Megan P, DO  rosuvastatin (CRESTOR) 40 MG tablet Take 1 tablet (40 mg total) by mouth daily. 10/10/20 01/08/21  Loel Dubonnet, NP   traZODone (DESYREL) 100 MG tablet Take 1 tablet (100 mg total) by mouth at bedtime. Take along with one 50 mg tablet for total 150 mg at bedtime 08/19/21   Johnson, Megan P, DO  traZODone (DESYREL) 50 MG tablet Take 1 tablet (50 mg total) by mouth at bedtime. Take along with one 100 mg tablet for total 150 mg at bedtime 08/19/21   Valerie Roys, DO    Physical Exam: BP (!) 106/50   Pulse 65   Temp (!) 97.3 F (36.3 C) (Oral)   Resp 11   Ht 5\' 5"  (1.651 m)   Wt 96.4 kg   SpO2 93%   BMI 35.38 kg/m   General: 76 y.o. year-old female well developed well nourished in no acute distress.  . Cardiovascular: Regular rate and rhythm with no rubs or gallops.  No thyromegaly or JVD noted.  No lower extremity edema. 2/4 pulses in all 4 extremities. Respiratory: Clear to auscultation with no wheezes or rales.  Poor inspiratory effort. Abdomen: Soft nontender nondistended with normal bowel sounds x4 quadrants. Muskuloskeletal: No cyanosis, clubbing or edema noted bilaterally Neuro: CN II-XII intact, strength, sensation, reflexes Skin: No ulcerative lesions noted or rashes Psychiatry: Judgement and insight appear altered.  Unable to assess mood due to lethargy.         Labs on Admission:  Basic Metabolic Panel: Recent Labs  Lab 12/31/2021 1931 01/11/2022 1937  NA 141 142  K 3.7 3.6  CL 111 111  CO2 22  --   GLUCOSE 197* 192*  BUN 38* 37*  CREATININE 1.55* 1.50*  CALCIUM 9.0  --    Liver Function Tests: Recent Labs  Lab 12/27/2021 1931  AST 24  ALT 15  ALKPHOS 47  BILITOT 0.7  PROT 5.9*  ALBUMIN 3.5   No results for input(s): "LIPASE", "AMYLASE" in the last 168 hours. No results for input(s): "AMMONIA" in the last 168 hours. CBC: Recent Labs  Lab 12/28/2021 1931 12/24/2021 1937  WBC 6.0  --   NEUTROABS 4.5  --   HGB 11.3* 12.2  HCT 37.8 36.0  MCV 84.2  --   PLT 181  --    Cardiac Enzymes: No results for input(s): "CKTOTAL", "  CKMB", "CKMBINDEX", "TROPONINI" in the last 168  hours.  BNP (last 3 results) Recent Labs    01/08/21 0602  BNP 134.9*    ProBNP (last 3 results) No results for input(s): "PROBNP" in the last 8760 hours.  CBG: Recent Labs  Lab 01/13/2022 1932 01/03/22 0001  GLUCAP 175* 134*    Radiological Exams on Admission: MR BRAIN WO CONTRAST  Result Date: 12/21/2021 CLINICAL DATA:  Initial evaluation for neuro deficit, stroke suspected. EXAM: MRI HEAD WITHOUT CONTRAST TECHNIQUE: Multiplanar, multiecho pulse sequences of the brain and surrounding structures were obtained without intravenous contrast. COMPARISON:  Prior CT from earlier the same day. FINDINGS: Brain: Generalized age-related cerebral atrophy. Patchy and confluent T2/FLAIR hyperintensity involving the periventricular deep white matter both cerebral hemispheres, most consistent with chronic small vessel ischemic disease, moderately advanced in nature. Associated scattered remote lacunar infarcts about the deep gray nuclei. Multifocal areas of encephalomalacia and gliosis involving the anterior right frontal lobe, parasagittal posterior left frontal lobe, and bilateral parieto-occipital regions, consistent with chronic ischemic infarcts. Few additional small remote bilateral cerebellar infarcts. Scattered chronic blood products noted about a few of these chronic ischemic changes. No abnormal foci of restricted diffusion to suggest acute or subacute ischemia. Gray-white matter differentiation maintained. No other acute or chronic intracranial blood products. No mass lesion, midline shift or mass effect. No hydrocephalus or extra-axial fluid collection. Pituitary gland suprasellar region within normal limits. Vascular: Major intracranial vascular flow voids are maintained. Skull and upper cervical spine: Craniocervical junction with normal limits. Bone marrow signal intensity normal. No scalp soft tissue abnormality. Sinuses/Orbits: Prior bilateral ocular lens replacement. Paranasal sinuses and  mastoid air cells are largely clear. Other: None. IMPRESSION: 1. No acute intracranial abnormality. 2. Underlying age-related cerebral atrophy with moderate chronic small vessel ischemic disease, with multiple remote ischemic infarcts as above. Electronically Signed   By: Jeannine Boga M.D.   On: 01/16/2022 22:54   MR Cervical Spine Wo Contrast  Result Date: 01/18/2022 CLINICAL DATA:  Initial evaluation for non accidental trauma suspected, abnormal neuro exam. EXAM: MRI CERVICAL SPINE WITHOUT CONTRAST TECHNIQUE: Multiplanar, multisequence MR imaging of the cervical spine was performed. No intravenous contrast was administered. COMPARISON:  None available. FINDINGS: Alignment: Examination degraded by motion artifact. Straightening with mild reversal of the normal cervical lordosis. Trace anterolisthesis of C3 on C4 and C4 on C5, likely chronic and degenerative. Vertebrae: Vertebral body height maintained without acute or chronic fracture. Bone marrow signal intensity within normal limits. No discrete or worrisome osseous lesions. Minimal reactive marrow edema present about the right C3-4 facet due to facet arthritis. Mild discogenic reactive endplate change present about the C5-6 interspace. No other abnormal marrow edema. Cord: Normal signal morphology. Posterior Fossa, vertebral arteries, paraspinal tissues: Atrophy with chronic ischemic changes noted within the partially visualized brain. Paraspinous soft tissues within normal limits. Right vertebral artery hypoplastic and not well seen, possibly occluded within the neck. Normal preserved flow voids seen within the dominant left vertebral artery. Disc levels: C2-C3: Small central disc protrusion indents the ventral thecal sac. Moderate left-sided facet hypertrophy. No spinal stenosis. Mild left C3 foraminal narrowing. Right neural foramen remains patent. C3-C4: Trace anterolisthesis. Broad-based right paracentral disc protrusion flattens and indents the  right ventral thecal sac (series 5, image 32). Resultant mild spinal stenosis. Superimposed right worse than left uncovertebral and facet hypertrophy. Moderate right C4 foraminal stenosis. Left neural foramen remains patent. C4-C5: Degenerative intervertebral disc space narrowing. Right paracentral disc osteophyte complex flattens and indents the ventral  thecal sac. Mild cord flattening without cord signal changes. Mild to moderate spinal stenosis. Superimposed right greater than left uncovertebral and facet hypertrophy with resultant moderate right C5 foraminal stenosis. Left neural foramina remains grossly patent. C5-C6: Degenerative intervertebral disc space narrowing with circumferential disc osteophyte complex. Broad posterior component indents and flattens the ventral thecal sac. Moderate spinal stenosis with mild cord flattening but no cord signal changes. Severe right with moderate left C6 foraminal narrowing. C6-C7: Degenerative intervertebral disc space narrowing. Right paracentral disc osteophyte complex indents and partially effaces the ventral thecal sac. Mild cord flattening without cord signal changes. Mild spinal stenosis. Moderate right worse than left C7 foraminal narrowing. C7-T1: Negative interspace. Mild facet hypertrophy. No canal or foraminal stenosis. Visualized upper thoracic spine demonstrates no significant finding. IMPRESSION: 1. No acute abnormality within the cervical spine. 2. Multilevel cervical spondylosis with resultant mild to moderate diffuse spinal stenosis at C3-4 through C6-7. 3. Multifactorial degenerative changes with resultant multilevel foraminal narrowing as above. Notable findings include moderate right C4 and C5 foraminal stenosis, severe right with moderate left C6 foraminal narrowing, with moderate right worse than left C7 foraminal stenosis. 4. 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 Preserved flow void within  the dominant left vertebral artery. Electronically Signed   By: Rise Mu M.D.   On: 12/23/2021 22:40   CT HEAD CODE STROKE WO CONTRAST  Result Date: 01/13/2022 CLINICAL DATA:  Code stroke. Provided history: Neuro deficit, acute, stroke suspected. EXAM: CT HEAD WITHOUT CONTRAST TECHNIQUE: Contiguous axial images were obtained from the base of the skull through the vertex without intravenous contrast. RADIATION DOSE REDUCTION: This exam was performed according to the departmental dose-optimization program which includes automated exposure control, adjustment of the mA and/or kV according to patient size and/or use of iterative reconstruction technique. COMPARISON:  Brain MRI 01/07/2021. head CT 11/02/2021. FINDINGS: Brain: Mild generalized parenchymal atrophy. Redemonstrated chronic cortical/subcortical infarcts within the posteromedial left frontal lobe and bilateral parietooccipital lobes. Chronic lacunar infarcts within the bilateral basal ganglia, thalami and cerebellar hemispheres, some of which were better appreciated on the prior brain MRI of 01/07/2021. Background mild patchy and ill-defined hypoattenuation within the cerebral white matter, nonspecific but compatible with chronic small vessel ischemic disease. There is no acute intracranial hemorrhage. No acute demarcated cortical infarct. No extra-axial fluid collection. No evidence of an intracranial mass. No midline shift. Vascular: No hyperdense vessel. Atherosclerotic calcifications. Skull: No fracture or aggressive osseous lesion. Sinuses/Orbits: No mass or acute finding within the imaged orbits. Minimal mucosal thickening within the bilateral ethmoid and right maxillary sinuses. ASPECTS [facility] Stroke Program Early CT Score) - Ganglionic level infarction (caudate, lentiform nuclei, internal capsule, insula, M1-M3 cortex): 7 - Supraganglionic infarction (M4-M6 cortex): 3 Total score (0-10 with 10 being normal): 10 (when discounting  chronic infarcts). These results were communicated to Dr. Otelia Limes at 8:01 pmon 06/15/2023by text page via the [facility] messaging system. IMPRESSION: No evidence of acute intracranial abnormality. Parenchymal atrophy, chronic small vessel ischemic disease and multiple chronic infarcts as described and not appreciably changed from the prior brain MRI of 01/07/2021. Electronically Signed   By: Jackey Loge D.O.   On: 01/01/2022 20:02    EKG: I independently viewed the EKG done and my findings are as followed: Sinus rhythm rate of 69.  Nonspecific ST-T changes.  QTc 491.  Assessment/Plan Present on Admission: **None**  Principal Problem:   Generalized weakness  Generalized weakness, likely multifactorial Hypovolemia, presumptive UTI Noncontrast CT head,  MRI brain negative for acute CVA Treat underlying conditions  Fall/aspiration/delirium precautions in place. PT OT assessment tomorrow  Acute metabolic encephalopathy, unclear etiology Obtain EEG, TSH, ammonia level Obtain stat VBG to rule out CO2 retention Hold off psychotropic medications until her mentation clears up.  Presumptive UTI, POA UA positive for pyuria Follow urine culture Started on Rocephin empirically  AKI, likely prerenal in setting of dehydration and poor oral intake At baseline, creatinine 1.0 with GFR greater than 60. Presented with creatinine 1.55 with GFR 35 Avoid nephrotoxic agents, dehydration and hypotension. Monitor urine output with strict I's and O's. Repeat renal panel in the morning.  Type 2 diabetes with hyperglycemia Serum glucose 192 Insulin sliding scale Avoid hypoglycemia  HFpEF Last 2D echo done on 07/11/2020 showed LVEF 60 to 65%, grade 1 diastolic dysfunction. Start strict I's and O's and daily weight Hold off home cardia medication due to soft BPs.  Essential hypertension, BPs are currently soft Hold off home oral antihypertensives Maintain MAP greater than 65 Closely monitor vital  signs   DVT prophylaxis: Subcu Lovenox daily  Code Status: DNR per the daughter at bedside.  Family Communication: Daughter at bedside.  Disposition Plan: Admitted to telemetry medical unit  Consults called: Neurology consulted by EDP  Admission status: Inpatient status.   Status is: Inpatient The patient requires at least 2 midnight for further evaluation and treatment of present condition.   Darlin Drop MD [facility] Pager [phone]  If 7PM-7AM, please contact night-coverage [email or web address] Password [facility]  01/03/2022, 12:10 AM

## 2022-01-04 DIAGNOSIS — Z7189 Other specified counseling: Secondary | ICD-10-CM

## 2022-01-04 DIAGNOSIS — Z515 Encounter for palliative care: Secondary | ICD-10-CM | POA: Diagnosis not present

## 2022-01-04 DIAGNOSIS — R531 Weakness: Secondary | ICD-10-CM | POA: Diagnosis not present

## 2022-01-04 LAB — URINE CULTURE: Culture: <10000 — AB

## 2022-01-04 MED ORDER — CHLORHEXIDINE GLUCONATE CLOTH 2 % EX PADS
6.0000 | MEDICATED_PAD | Freq: Every day | CUTANEOUS | Status: DC
  Administered 2022-01-05: 6 via TOPICAL

## 2022-01-04 MED ORDER — LORAZEPAM 2 MG/ML IJ SOLN
0.5000 mg | Freq: Four times a day (QID) | INTRAMUSCULAR | Status: DC
  Administered 2022-01-04 – 2022-01-05 (×6): 0.5 mg via INTRAVENOUS
  Filled 2022-01-04 (×6): qty 1

## 2022-01-04 NOTE — Consult Note (Cosign Needed)
Palliative Medicine Inpatient Consult Note  Consulting Provider: Leslee Home, DO  Reason for consult:   [facility]iative Medicine Consult  Reason for Consult? hospice   01/04/2022  HPI:  Per intake H&P -->  Tara [name] is a 76 y.o. female with medical history significant for prior CVA on DAPT, chronic pain of both knees on chronic opioid use, essential hypertension, hyperlipidemia, GERD, chronic anxiety/depression, who presented to [facility] ED from home via EMS due to generalized weakness and altered mental status.    Palliative care asked to get involved to further aid in hospice coordination.   Clinical Assessment/Goals of Care:  *Please note that this is a verbal dictation therefore any spelling or grammatical errors are due to the "[place]" system interpretation.  I have reviewed medical records including EPIC notes, labs and imaging, received report from bedside RN, assessed the patient.    I called [name], Cain Saupe to further discuss diagnosis prognosis, GOC, EOL wishes, disposition and options.   I introduced Palliative Medicine as specialized medical care for people living with serious illness. It focuses on providing relief from the symptoms and stress of a serious illness. The goal is to improve quality of life for both the patient and the family.  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  Social History:  Tara [name] is originally from [place], [place].  She is a widow as of recently (January).  She used to work for the [employer] where she retired from.  Functional and Nutritional State:  Tara [name] has been living with her [name] for 3 months alternating with her granddaughter for 3 months.  Her Tara [name] she requires 24/7 care.  She needs help with all BADLs and IADLs.  She  is bedbound.  Advance Directives:  A detailed discussion was had today regarding advanced directives.    Code Status:  Tara [name] is an established DNAR/DNI.   Discussion:  Reviewed with [name], Tara [name] the plan for continuation of comfort oriented care and symptom management.  Tara [name] shares that her mother situation is a bit different from her father as her father passed relatively quickly in the setting of a pneumonia.  She recognizes that her mother's situation may be slightly different though she does feel that likely sepsis will contribute to the end of her life.  Tara [name] shares that her mother was able to make the decision to not go through recurrent readmissions or invasive treatments on her own accord.  Tara [name] expresses that Tara [name] has had a diminutive quality of life for quite some time now.  From the perspective of placement from hospitalization [name] is hopeful she can transition to a hospice home closer to her in  [place].  I shared that I would reach out to the medical social worker to support this.  Reviewed in terms of active symptom management presently [name] does have some generalized anxiety which we are treating with Ativan around-the-clock.  I did endorse to [name] that this medication could potentially make Tara [name] more sleepy which she was aware of.  Discussed the importance of continued conversation with family and their  medical providers regarding overall plan of care and treatment options, ensuring decisions are within the context of the patients values and GOCs.  Decision Maker: [name],Tara [name]     [phone]   SUMMARY OF RECOMMENDATIONS   DNAR/DNI  Comfort focused care  Continue pain medications as needed-added low-dose Ativan every 6 hours to better address anxiety  Unrestricted visitation  Appreciate transitions of care team attempt to find hospice home placement near [name]  Ongoing palliative care  support until discharge  Prognosis < 2 weeks  Code Status/Advance Care Planning: DNAR/DNI  Palliative Prophylaxis:  Aspiration, Bowel Regimen, Delirium Protocol, Frequent Pain Assessment, Oral Care, Palliative Wound Care, and Turn Reposition  Additional Recommendations (Limitations, Scope, Preferences): Continue current scope of care  Psycho-social/Spiritual:  Desire for further Chaplaincy support: No, not presently Additional Recommendations: Education on end-of-life process   Prognosis: Limited to weeks  Discharge Planning: Discharge to inpatient hospice once placement is found Discharge Vitals:   01/03/22 1945 01/03/22 1947  BP:  (!) 148/64  Pulse:    Resp:  14  Temp:  98 F (36.7 C)  SpO2: (!) 87% 92%   No intake or output data in the 24 hours ending 01/04/22 0636 Last Weight  Most recent update: 01/14/2022  8:08 PM    Weight  96.4 kg (212 lb 9.6 oz)            Gen:  NAD HEENT: moist mucous membranes CV: Regular rate and rhythm PULM: ABD: soft/nontender EXT: No edema Neuro: Alert and oriented x3  PPS: 10%   This conversation/these recommendations were discussed with patient primary care team, Dr. Rowe Pavy __________________________ Addendum:  I met with [name], Tara [name] this evening.  She shares that her mother is now at a point where she cannot safely swallow nor recognize how to do this properly.  I was able to listen to some of Tara's concerns about patient's symptom management and offer therapeutic support through empathetic listening.  We reviewed that [name] is at the end of life process and we are trying to find a facility for her to transition to.  Mickel Baas was thankful for the time spent speaking with her.  It should be noted that with patient's absence of eating or drinking she truly does qualify for inpatient hospice.  In addition we are managing symptoms inclusive of anxiety and bilateral leg pain.  Time: 30 minutes  Billing based on MDM:  High  Problems Addressed: One acute or chronic illness or injury that poses a threat to life or bodily function  Amount and/or Complexity of Data: Category 3:Discussion of management or test interpretation with external physician/other qualified health care professional/appropriate source (not separately reported)  Risks: Decision not to resuscitate or to de-escalate care because of poor prognosis ______________________________________________________ [facility] Team Team Cell Phone: [phone] Please utilize secure chat with additional questions, if there is no response within 30 minutes please call the above phone number  Palliative Medicine Team providers are available by phone from 7am to 7pm daily and can be reached through the team cell phone.  Should this patient require assistance outside of these hours, please call the patient's attending physician.

## 2022-01-04 NOTE — Progress Notes (Signed)
Progress Note:    Tara [name]    RSW:[id number] DOB: Jan 16, 1946 DOA: 01/15/2022  PCP: Dorcas Carrow, DO    Brief Narrative:    76 y.o. 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  Patient lives at home with her grand daughter.  She initially came in as a code stroke due to reported inability to ambulate since after 5:40 PM.     Per family at bedside she has been progressively confused for the past 2 to 3 days with associated poor oral intake and poor appetite.  No subjective fevers reported.  No GI symptoms reported.  No seizure-like activities.  In the ED work-up was negative for acute CVA post noncontrast CT head and MRI brain.  UA was positive for pyuria. Seen by neurology/stroke team who recommended admission for further work-up. Noncontrast MRI thoracic spine and lumbar spine showed no acute findings.  Ammonia was elevated and started on lactulose initially. On rocephin for UTI initially. After lengthy discussion family and patient elected to pursue hospice. Awaiting placement.    Subjective:   Looks comfortable. No other acute complaints. Daughter present at bedside.   Assessment and Plan:   Assessment: UTI Acute metabolic encephalopathy, waxing and waning, appears largely resolved now AKI Generalized weakness History of prior CVAs Type 2 diabetes mellitus HFpEF Hypertension Chronic pain Hyperlipidemia Depression/anxiety GERD  Plan: She is not imminent. Awaiting placement at facility. PRN medications ordered for comfort. Palliative following. Started on scheduled Ativan to help with her anxiety. Place foley to help with urinary urgency.    Other information:    DVT prophylaxis: None Code  Status: DNR Family Communication: Daughter present at bedside Disposition:   Status is: Inpatient Remains inpatient appropriate because: awaiting hospice placement       Consultants:   Palliative    Objective:    Vitals:   01/03/22 1925 01/03/22 1945 01/03/22 1947 01/04/22 0751  BP:   (!) 148/64 (!) 186/76  Pulse:    69  Resp: 16  14 18   Temp:   98 F (36.7 C) 98.1 F (36.7 C)  TempSrc:   Oral Oral  SpO2:  (!) 87% 92% 91%  Weight:      Height:        Intake/Output Summary (Last 24 hours) at 01/04/2022 1749 Last data filed at 01/04/2022 1700 Gross per 24 hour  Intake --  Output 1000 ml  Net -1000 ml   Filed Weights   12/20/2021 1900 01/06/2022 2007  Weight: 97.4 kg 96.4 kg       Physical Exam:    General exam: Appears calm and comfortable  Respiratory system: Clear to auscultation. Respiratory effort normal. Cardiovascular system: S1 & S2 heard, RRR. No JVD, murmurs, rubs, gallops or clicks. No pedal edema. Gastrointestinal system: Abdomen is nondistended, soft and nontender. No organomegaly or masses felt. Normal bowel sounds heard. Central nervous system: Alert and oriented. No focal neurological deficits. Extremities: Symmetric 5 x 5 power. Skin: No rashes, lesions or ulcers Psychiatry: Judgement and insight appear normal. Mood & affect appropriate.     Data Reviewed:    I have personally reviewed following labs and imaging studies  CBC: Recent Labs  Lab 12/27/2021 1931 01/01/2022 1937 01/03/22 0015  WBC 6.0  --  5.0  NEUTROABS 4.5  --  3.3  HGB 11.3* 12.2 10.5*  HCT 37.8 36.0 34.8*  MCV 84.2  --  84.3  PLT 181  --  181    Basic Metabolic Panel: Recent Labs  Lab 12/20/2021 1931 01/01/2022 1937 01/03/22 0015  NA 141 142 143  K 3.7 3.6 3.8  CL 111 111 113*  CO2 22  --  22  GLUCOSE 197* 192* 123*  BUN 38* 37* 39*  CREATININE 1.55* 1.50* 1.60*  CALCIUM 9.0  --  8.5*  MG  --   --  2.0  PHOS  --   --  4.2    GFR: Estimated Creatinine  Clearance: 36 mL/min (A) (by C-G formula based on SCr of 1.6 mg/dL (H)).  Liver Function Tests: Recent Labs  Lab 01/09/2022 1931 01/03/22 0015  AST 24 21  ALT 15 16  ALKPHOS 47 42  BILITOT 0.7 0.6  PROT 5.9* 5.6*  ALBUMIN 3.5 3.2*    CBG: Recent Labs  Lab 01/03/22 0001 01/03/22 0722 01/03/22 1118 01/03/22 1547 01/03/22 2057  GLUCAP 134* 103* 114* 104* 114*     Recent Results (from the past 240 hour(s))  Urine Culture     Status: Abnormal   Collection Time: 12/29/2021 10:48 PM   Specimen: Urine, Clean Catch  Result Value Ref Range Status   Specimen Description URINE, CLEAN CATCH  Final   Special Requests NONE  Final   Culture (A)  Final    <10,000 COLONIES/mL INSIGNIFICANT GROWTH Performed at [facility], [address], [place], [place] [address]    Report Status 01/04/2022 FINAL  Final         Radiology Studies:    US Abdomen Limited RUQ (LIVER/GB)  Result Date: 01/03/2022 CLINICAL DATA:  Abdominal pain EXAM: ULTRASOUND ABDOMEN LIMITED RIGHT UPPER QUADRANT COMPARISON:  None Available. FINDINGS: Gallbladder: Gallbladder is not seen consistent with cholecystectomy. Common bile duct: Diameter: 8.7 mm Liver: There is increased echogenicity suggesting fatty infiltration. No focal abnormality is seen in the visualized portions of liver. Portal vein is patent on color Doppler imaging with normal direction of blood flow towards the liver. Other: None. IMPRESSION: Fatty liver. Status post cholecystectomy. No other sonographic abnormality is seen in the right upper quadrant. Electronically Signed   By: Ernie Avena M.D.   On: 01/03/2022 10:17   EEG adult  Result Date: 01/03/2022 Charlsie Quest, MD     01/03/2022  8:24 AM Patient Name: Tara [name] MRN: [id number] Epilepsy Attending: Charlsie Quest Referring Physician/Provider: Darlin Drop, DO Date: 01/18/2022 Duration: 21.34 mins Patient history: 76 year old female with altered mental status.  EEG  to evaluate for seizure. Level of alertness: Awake AEDs during EEG study: None Technical aspects: This EEG study was done with scalp electrodes positioned according to the 10-20 International system of electrode placement. Electrical activity was acquired at a sampling rate of 500Hz  and reviewed with a high frequency filter of 70Hz  and a low frequency filter of 1Hz . EEG data were recorded continuously and digitally stored. Description: No clear posterior dominant rhythm was seen. EEG showed continuous generalized 3 to 6 Hz theta- delta slowing.  Hyperventilation and photic stimulation were not performed.   ABNORMALITY - Continuous slow, generalized IMPRESSION: This study is suggestive of moderate diffuse encephalopathy, nonspecific etiology. No seizures or epileptiform discharges were seen throughout the recording.   MR LUMBAR SPINE WO CONTRAST  Result Date: 01/03/2022 CLINICAL DATA:  Initial evaluation for  acute encephalopathy, generalized weakness. EXAM: MRI LUMBAR SPINE WITHOUT CONTRAST TECHNIQUE: Multiplanar, multisequence MR imaging of the lumbar spine was performed. No intravenous contrast was administered. COMPARISON:  Prior MRI from 01/08/2021. FINDINGS: Segmentation: Examination is severely limited as the patient was unable to tolerate the full length of the exam. Sagittal T2 and STIR sequences only were performed. Additionally, the provided images are severely degraded by motion artifact. 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 Same numbering system employed as on previous exam. Alignment: Physiologic with preservation of the normal lumbar lordosis. No interval listhesis or malalignment. Vertebrae: Vertebral body height maintained without acute or chronic fracture. Bone marrow signal intensity grossly within normal limits. No visible discrete osseous lesions or abnormal marrow edema. Conus medullaris and cauda  equina: Conus extends to the L1 level. Evaluation of the conus and cauda equina fairly limited on this exam. No obvious abnormality. Paraspinal and other soft tissues: Partially visualized paraspinous soft tissues demonstrate no obvious abnormality. Disc levels: Multilevel degenerative disc disease and facet hypertrophy, most pronounced at L2-3 and L3-4, grossly similar as compared to previous exam. Associated mild-to-moderate spinal stenosis at L2-3 and L3-4, grossly similar. No appreciable progressive stenosis on this limited exam. Evaluation for foraminal encroachment limited on this examination. IMPRESSION: 1. Severely limited exam due to the patient's inability to tolerate the full length of the exam and extensive motion. 2. No definite acute abnormality. Grossly stable appearance of multilevel degenerative spondylosis with resultant mild to moderate spinal stenosis at L2-3 and L3-4. 3. 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 If there remains clinical concern for possible occult pathology, a repeat examination when the patient is able to tolerate the exam would be recommended. Electronically Signed   By: Rise Mu M.D.   On: 01/03/2022 04:31   MR THORACIC SPINE WO CONTRAST  Result Date: 01/03/2022 CLINICAL DATA:  Initial evaluation for acute encephalopathy with generalized weakness. EXAM: MRI THORACIC SPINE WITHOUT CONTRAST TECHNIQUE: Multiplanar, multisequence MR imaging of the thoracic spine was performed. No intravenous contrast was administered. COMPARISON:  Previous exam from 01/08/2021. FINDINGS: Alignment: Examination is technically limited as the patient was unable to tolerate the full length of the exam. No counter sequence is provided, and vertebral body count is made in comparison with previous exam and prior cervical spine MRI from 12/24/2021. Additionally, axial GRE sequence was not performed either. The images at are provided are moderately  degraded by motion. Mild exaggeration of the normal thoracic kyphosis. No significant listhesis. Vertebrae: Vertebral body height maintained without acute or chronic fracture. Bone marrow signal intensity within normal limits. Small benign hemangioma noted within the T11 vertebral body. No worrisome osseous lesions. No abnormal marrow edema. Cord: Grossly normal signal and morphology. No appreciable cord signal changes on this motion degraded and limited exam. Paraspinal and other soft tissues: Unremarkable. Disc levels: T1-2: Unremarkable. T2-3: Negative interspace. Mild left-sided facet hypertrophy. No stenosis. T3-4: Unremarkable. T4-5: Unremarkable. T5-6: Small right paracentral disc protrusion indents the ventral thecal sac. No significant spinal stenosis or cord deformity. Foramina remain patent. T6-7: Disc bulge with small right paracentral disc protrusion. Reactive endplate spurring. No significant spinal stenosis. Foramina remain patent. T7-8: Left paracentral disc protrusion indents the left ventral thecal sac, contacting and minimally flattening the left ventral cord. No cord signal changes. No significant spinal stenosis. Foramina remain patent. T8-9: Small left paracentral disc protrusion indents the left ventral thecal sac. Minimal flattening of the left ventral cord without  cord signal changes or significant spinal stenosis. Foramina remain patent. T9-10: Chronic endplate Schmorl's node deformity without significant disc bulge. No stenosis. T10-11: Disc bulge with small central disc protrusion. Mild facet hypertrophy. No significant spinal stenosis. Foramina remain patent. T11-12: Unremarkable. T12-L1: Seen only on sagittal projection. Small central disc protrusion without significant spinal stenosis. Foramina remain patent. IMPRESSION: 1. Technically limited exam due to the patient's inability to tolerate the full length of the study and motion artifact. 2. Grossly normal MRI appearance of the  thoracic spinal cord. 3. Small disc protrusions at T5-6 through T10-11 as above. No significant spinal stenosis. Electronically Signed   By: Rise MuBenjamin  McClintock M.D.   On: 01/03/2022 04:24   MR BRAIN WO CONTRAST  Result Date: 12/31/2021 CLINICAL DATA:  Initial evaluation for neuro deficit, stroke suspected. EXAM: MRI HEAD WITHOUT CONTRAST TECHNIQUE: Multiplanar, multiecho pulse sequences of the brain and surrounding structures were obtained without intravenous contrast. COMPARISON:  Prior CT from earlier the same day. FINDINGS: Brain: Generalized age-related cerebral atrophy. Patchy and confluent T2/FLAIR hyperintensity involving the periventricular deep white matter both cerebral hemispheres, most consistent with chronic small vessel ischemic disease, moderately advanced in nature. Associated scattered remote lacunar infarcts about the deep gray nuclei. Multifocal areas of encephalomalacia and gliosis involving the anterior right frontal lobe, parasagittal posterior left frontal lobe, and bilateral parieto-occipital regions, consistent with chronic ischemic infarcts. Few additional small remote bilateral cerebellar infarcts. Scattered chronic blood products noted about a few of these chronic ischemic changes. No abnormal foci of restricted diffusion to suggest acute or subacute ischemia. Gray-white matter differentiation maintained. No other acute or chronic intracranial blood products. No mass lesion, midline shift or mass effect. No hydrocephalus or extra-axial fluid collection. Pituitary gland suprasellar region within normal limits. Vascular: Major intracranial vascular flow voids are maintained. Skull and upper cervical spine: Craniocervical junction with normal limits. Bone marrow signal intensity normal. No scalp soft tissue abnormality. Sinuses/Orbits: Prior bilateral ocular lens replacement. Paranasal sinuses and mastoid air cells are largely clear. Other: None. IMPRESSION: 1. No acute intracranial  abnormality. 2. Underlying age-related cerebral atrophy with moderate chronic small vessel ischemic disease, with multiple remote ischemic infarcts as above. Electronically Signed   By: Rise MuBenjamin  McClintock M.D.   On: 01/18/2022 22:54   MR Cervical Spine Wo Contrast  Result Date: 12/22/2021 CLINICAL DATA:  Initial evaluation for non accidental trauma suspected, abnormal neuro exam. EXAM: MRI CERVICAL SPINE WITHOUT CONTRAST TECHNIQUE: Multiplanar, multisequence MR imaging of the cervical spine was performed. No intravenous contrast was administered. COMPARISON:  None available. FINDINGS: Alignment: Examination degraded by motion artifact. Straightening with mild reversal of the normal cervical lordosis. Trace anterolisthesis of C3 on C4 and C4 on C5, likely chronic and degenerative. Vertebrae: Vertebral body height maintained without acute or chronic fracture. Bone marrow signal intensity within normal limits. No discrete or worrisome osseous lesions. Minimal reactive marrow edema present about the right C3-4 facet due to facet arthritis. Mild discogenic reactive endplate change present about the C5-6 interspace. No other abnormal marrow edema. Cord: Normal signal morphology. Posterior Fossa, vertebral arteries, paraspinal tissues: Atrophy with chronic ischemic changes noted within the partially visualized brain. Paraspinous soft tissues within normal limits. Right vertebral artery hypoplastic and not well seen, possibly occluded within the neck. Normal preserved flow voids seen within the dominant left vertebral artery. Disc levels: C2-C3: Small central disc protrusion indents the ventral thecal sac. Moderate left-sided facet hypertrophy. No spinal stenosis. Mild left C3 foraminal narrowing. Right neural foramen remains patent. C3-C4:  Trace anterolisthesis. Broad-based right paracentral disc protrusion flattens and indents the right ventral thecal sac (series 5, image 32). Resultant mild spinal stenosis.  Superimposed right worse than left uncovertebral and facet hypertrophy. Moderate right C4 foraminal stenosis. Left neural foramen remains patent. C4-C5: Degenerative intervertebral disc space narrowing. Right paracentral disc osteophyte complex flattens and indents the ventral thecal sac. Mild cord flattening without cord signal changes. Mild to moderate spinal stenosis. Superimposed right greater than left uncovertebral and facet hypertrophy with resultant moderate right C5 foraminal stenosis. Left neural foramina remains grossly patent. C5-C6: Degenerative intervertebral disc space narrowing with circumferential disc osteophyte complex. Broad posterior component indents and flattens the ventral thecal sac. Moderate spinal stenosis with mild cord flattening but no cord signal changes. Severe right with moderate left C6 foraminal narrowing. C6-C7: Degenerative intervertebral disc space narrowing. Right paracentral disc osteophyte complex indents and partially effaces the ventral thecal sac. Mild cord flattening without cord signal changes. Mild spinal stenosis. Moderate right worse than left C7 foraminal narrowing. C7-T1: Negative interspace. Mild facet hypertrophy. No canal or foraminal stenosis. Visualized upper thoracic spine demonstrates no significant finding. IMPRESSION: 1. No acute abnormality within the cervical spine. 2. Multilevel cervical spondylosis with resultant mild to moderate diffuse spinal stenosis at C3-4 through C6-7. 3. Multifactorial degenerative changes with resultant multilevel foraminal narrowing as above. Notable findings include moderate right C4 and C5 foraminal stenosis, severe right with moderate left C6 foraminal narrowing, with moderate right worse than left C7 foraminal stenosis. 4. 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 Preserved flow void within the dominant left vertebral artery. Electronically Signed   By: Rise Mu M.D.   On: 06-Jan-2022 22:40   CT HEAD CODE STROKE WO CONTRAST  Result Date: 06-Jan-2022 CLINICAL DATA:  Code stroke. Provided history: Neuro deficit, acute, stroke suspected. EXAM: CT HEAD WITHOUT CONTRAST TECHNIQUE: Contiguous axial images were obtained from the base of the skull through the vertex without intravenous contrast. RADIATION DOSE REDUCTION: This exam was performed according to the departmental dose-optimization program which includes automated exposure control, adjustment of the mA and/or kV according to patient size and/or use of iterative reconstruction technique. COMPARISON:  Brain MRI 01/07/2021. head CT 11/02/2021. FINDINGS: Brain: Mild generalized parenchymal atrophy. Redemonstrated chronic cortical/subcortical infarcts within the posteromedial left frontal lobe and bilateral parietooccipital lobes. Chronic lacunar infarcts within the bilateral basal ganglia, thalami and cerebellar hemispheres, some of which were better appreciated on the prior brain MRI of 01/07/2021. Background mild patchy and ill-defined hypoattenuation within the cerebral white matter, nonspecific but compatible with chronic small vessel ischemic disease. There is no acute intracranial hemorrhage. No acute demarcated cortical infarct. No extra-axial fluid collection. No evidence of an intracranial mass. No midline shift. Vascular: No hyperdense vessel. Atherosclerotic calcifications. Skull: No fracture or aggressive osseous lesion. Sinuses/Orbits: No mass or acute finding within the imaged orbits. Minimal mucosal thickening within the bilateral ethmoid and right maxillary sinuses. ASPECTS [facility] Stroke Program Early CT Score) - Ganglionic level infarction (caudate, lentiform nuclei, internal capsule, insula, M1-M3 cortex): 7 - Supraganglionic infarction (M4-M6 cortex): 3 Total score (0-10 with 10 being normal): 10 (when discounting chronic infarcts). These results were communicated to Dr. Otelia Limes at 8:01 pmon  Jun 18, 2023by text page via the [facility] messaging system. IMPRESSION: No evidence of acute intracranial abnormality. Parenchymal atrophy, chronic small vessel ischemic disease and multiple chronic infarcts as described and not appreciably changed from the prior brain MRI of 01/07/2021. Electronically Signed   By: Ronaldo Miyamoto  Renette Butters D.O.   On: 11-Jan-2022 20:02        Medications:    Scheduled Meds:  [START ON 01/05/2022] Chlorhexidine Gluconate Cloth  6 each Topical Q0600   LORazepam  0.5 mg Intravenous Q6H   Continuous Infusions:     LOS: 2 days    Time spent: 35 minutes    Verdia Kuba, MD [facility]   To contact the attending provider between 7A-7P or the covering provider during after hours 7P-7A, please log into the web site [email or web address] and access using universal Jack password for that web site. If you do not have the password, please call the hospital operator.  01/04/2022, 5:49 PM

## 2022-01-04 NOTE — TOC Initial Note (Addendum)
Transition of Care [facility]) - Initial/Assessment Note    Patient Details  Name: Tara [name] MRN: [id number] Date of Birth: [birth year]  Transition of Care [facility]) CM/SW Contact:    Beckie Busing, RN Phone Number:[phone]  01/04/2022, 9:29 AM  Clinical Narrative:                 TOC consulted for patient /family request for residential hospice in the [place] area. Case manager spoke with daughter [name] [phone] who confirms. CM reached out to Urban Gibson with [facility] [phone]) for assistance in a contact for residential hospice in the area. Tim states that he does have contacts for the area and will call CM back with contact information. Daughter has been updated. TOC will continue to follow.  1200 CM called [facility] [phone] for residential hospice referral. CM will need to fax order, demographics and insurance information for review.   1253 All paperwork for referral has been faxed [phone]. Referral acceptance pending  1439 CM received return call from [place] at [facility] They are currently working on referral and acknowledge that they have received referral.   1542 CM received message from daughter stating that [facility] care called and spoke with daughter and made daughter aware that they only do in home hospice referrals. Cm was not made aware of this. CM has been requesting residential hospice bed and faxed orders requesting residential hospice. CM will attempt to call [facility] home care and hospice at this time.   1547 CM attempted to call Drinda Butts at [facility] and home care. CM spoke with Drinda Butts at [facility] care and hospice and per Drinda Butts she is under the impression that the patient is not at end of life and that daughter is looking for long term care for the duration  of her mothers life. Per Drinda Butts They are able to provide home hospice and crisis hospice. But the crisis hospice only admits patients  that are in a crisis situation like SOB, copious secretions  or pain issues. The crisis is treated and then the patient returns home. Currently this facility is unable to offer patient a bed. CM discussed this dilemma with Lamarr Lulas NP.             Patient Goals and CMS Choice        Expected Discharge Plan and Services                                                Prior Living Arrangements/Services                       Activities of Daily Living      Permission Sought/Granted                  Emotional Assessment              Admission diagnosis:  Acute encephalopathy [G93.40] Generalized weakness [R53.1] Patient Active Problem List   Diagnosis Date Noted   Generalized weakness 2022/01/04   Depression, recurrent (HCC) 10/14/2021   Coronary artery disease of native artery of native heart with stable angina pectoris (HCC) 08/19/2021   Fall    Lower extremity edema    Seizure-like activity (HCC)    Weakness 01/07/2021   Acute respiratory  failure with hypoxia and hypercarbia (HCC) 01/07/2021   Tobacco abuse 12/11/2020   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   History of stroke 07/25/2020   Memory loss, short term 07/25/2020   Visual disturbance 07/25/2020   Tremors of nervous system 07/25/2020   Difficulty walking 03/09/2020   TIA (transient ischemic attack) 03/09/2020   Polyneuropathy, unspecified 12/08/2019   Difficulty sleeping 10/29/2019   Numbness 10/29/2019   Right leg numbness 10/29/2019   Numbness and tingling in left hand 10/29/2019   Lymphedema 10/26/2019   Atherosclerosis of native arteries of extremity with intermittent claudication (HCC) 10/26/2019   Nerve pain 10/12/2019   Memory loss 10/12/2019   PAD (peripheral artery disease) (HCC) 08/02/2019   Temporal arteritis (HCC)    Other intervertebral disc degeneration, thoracic region 06/21/2019   GERD (gastroesophageal  reflux disease) 06/21/2019   Angiodysplasia of intestinal tract    Intestinal bypass or anastomosis status    History of aortic valve replacement 02/07/2019   B12 deficiency 02/07/2019   Anxiety 02/05/2019   HTN (hypertension) 02/05/2019   Hyperlipidemia 02/05/2019   IBS (irritable bowel syndrome) 02/05/2019   Arthritis of both knees 02/05/2019   CTS (carpal tunnel syndrome) 02/05/2019   PCP:  Dorcas Carrow, DO Pharmacy:   [id number] - [place], [place] - [address] AT [facility] [address] [place] [address] Phone: [phone] Fax: [phone]  [facility] - [place] - [address] [place] [address] Phone: [phone] Fax: [phone]     Social Determinants of Health (SDOH) Interventions    Readmission Risk Interventions    08/03/2019    5:36 PM  Readmission Risk Prevention Plan  Post Dischage Appt Complete  Medication Screening Complete  Transportation Screening Complete

## 2022-01-05 DIAGNOSIS — Z515 Encounter for palliative care: Secondary | ICD-10-CM | POA: Diagnosis not present

## 2022-01-05 DIAGNOSIS — Z7189 Other specified counseling: Secondary | ICD-10-CM | POA: Diagnosis not present

## 2022-01-05 DIAGNOSIS — R531 Weakness: Secondary | ICD-10-CM | POA: Diagnosis not present

## 2022-01-05 MED ORDER — MORPHINE SULFATE (PF) 2 MG/ML IV SOLN
2.0000 mg | INTRAVENOUS | Status: DC
  Administered 2022-01-05 – 2022-01-06 (×6): 2 mg via INTRAVENOUS
  Filled 2022-01-05 (×6): qty 1

## 2022-01-05 MED ORDER — MORPHINE SULFATE (PF) 2 MG/ML IV SOLN
1.0000 mg | Freq: Four times a day (QID) | INTRAVENOUS | Status: DC
  Administered 2022-01-05 (×2): 1 mg via INTRAVENOUS
  Filled 2022-01-05 (×2): qty 1

## 2022-01-05 MED ORDER — LORAZEPAM 2 MG/ML IJ SOLN
1.0000 mg | INTRAMUSCULAR | Status: DC
  Administered 2022-01-05 – 2022-01-08 (×17): 1 mg via INTRAVENOUS
  Filled 2022-01-05 (×17): qty 1

## 2022-01-05 NOTE — TOC Progression Note (Signed)
Transition of Care [facility]) - Progression Note    Patient Details  Name: Tara [name] MRN: [id number] Date of Birth: 1946-01-28  Transition of Care [facility]) CM/SW Contact  Lawerance Sabal, RN Phone Number: 01/05/2022, 4:56 PM  Clinical Narrative:    Sherron Monday w patient's daughter, Lawson Fiscal, over the phone to discuss disposition plans. Lawson Fiscal states that she has spoken with Lamarr Lulas, NP [facility], who has advised that the patient will not tolerate transport and would likely pass away enroute. CM confirmed with Lawson Fiscal that it is her wishes to pursue in hospital death at this time, and discontinue attempts to set up residential hospice.   Please reconsult and notify TOC if discharge plan changes.  TOC signing off.     [name],[name] Daughter   [phone]           Expected Discharge Plan and Services                                                 Social Determinants of Health (SDOH) Interventions    Readmission Risk Interventions    08/03/2019    5:36 PM  Readmission Risk Prevention Plan  Post Dischage Appt Complete  Medication Screening Complete  Transportation Screening Complete

## 2022-01-05 NOTE — Progress Notes (Addendum)
Palliative Medicine Inpatient Follow Up Note  HPI: Tara Tara [name] is a 76 y.o. female with medical history significant for prior CVA on DAPT, chronic pain of both knees on chronic opioid use, essential hypertension, hyperlipidemia, GERD, chronic anxiety/depression, who presented to [facility] ED from home via EMS due to generalized weakness and altered mental status.     Palliative care asked to get involved to further aid in hospice coordination.   Today's Discussion 01/05/2022  *Please note that this is a verbal dictation therefore any spelling or grammatical errors are due to the "[place] [employer]" system interpretation.  Chart reviewed inclusive of vital Tara [name], progress notes, laboratory results, and diagnostic images.   I met this morning with patients Tara [name], Tara Tara Tara [name]. He shares that he has been with his mother throughout the night. She has had a generalized headache throughout the night and had pain with movement. She received morphine. We discussed that Tara Tara [name] is more withdrawn and less able to provide an accurate assessment of pain at this time. We reviewed the importance of symptom management and agreed that Tara Tara [name] should be on something around the clock to better relieve painful episodes. I shared that in addition to ativan every 6 hours I would also add morphine. We reviewed if symptom burden becomes great we can always transition to a low dose opoid drip. At this time though this intervention is not needed. Reviewed that patient was placed back on oxygen overnight. Tara Tara [name] shares that his father was on oxygen until his final moments so he expresses some relief with this intervention. We discussed that ideally symptoms would be managed with medications though certainly if this brings family peace and does not cause patient distress it is okay.   I spoke to Tara Tara [name] who shared with her Tara [name] and I that she wants "sugar" when orange juice or a cookie were offered to quickly drifted off  to sleep. I reviewed with Tara Tara [name] that at this point Tara Tara [name] is not safe to swallow which he shared awareness of.   I spoke to patients bedside RN, Tara Tara [name] to update her on the plan.   Orders entered thereafter to reflect above conversation.   I sent a secure chat to [facility] after meeting with Tara Tara [name] this morning as she is appropriate for inpatient hospice.   Objective Assessment: Vital Tara [name] Vitals:   01/04/22 2119 01/05/22 0413  BP: (!) 139/47 (!) 154/65  Pulse: 70 70  Resp: 19 20  Temp: 98.9 F (37.2 C) 98 F (36.7 C)  SpO2: 90% 90%    Intake/Output Summary (Last 24 hours) at 01/05/2022 0830 Last data filed at 01/05/2022 0435 Gross per 24 hour  Intake --  Output 1325 ml  Net -1325 ml   Last Weight  Most recent update: 01/07/2022  8:08 PM    Weight  96.4 kg (212 lb 9.6 oz)            Gen:  NAD HEENT: moist mucous membranes CV: Regular rate and rhythm PULM: 2LPM [place] breathing even and nonlabored ABD: soft/nontender EXT: No edema Neuro: Somnolent this morning    SUMMARY OF RECOMMENDATIONS   DNAR/DNI   Comfort focused care   Continue pain medications as needed and low-dose Ativan + Morphine every 6 hours to better address anxiety and generalized pain   Unrestricted visitation   Appreciate transitions of care team attempt to find hospice home placement near patient's daughter in [facility]   Ongoing palliative care support until discharge  Prognosis < 2 weeks ______________________________ Addendum:  Received a call from patients RN, Tara Tara [name] this late afternoon that Tara Tara [name] was having difficult pain control. She was more agitated as well. We devised a plan to better control pain and agitation symptoms through liberalizing the ATC medications to Q4H. At this point patient is getting closer to end of life. Again, she is appropriate for inpatient hospice.   Billing based on MDM: High - Opoid review and modifications at end of life.   ______________________________________________________________________________________ [place] Team Team Cell Phone: [phone] Please utilize secure chat with additional questions, if there is no response within 30 minutes please call the above phone number  Palliative Medicine Team providers are available by phone from 7am to 7pm daily and can be reached through the team cell phone.  Should this patient require assistance outside of these hours, please call the patient's attending physician.

## 2022-01-05 NOTE — Progress Notes (Signed)
Progress Note:    Tara [name]    TIR:[id number] DOB: [birth year] DOA: 2022-01-06  PCP: Dorcas Carrow, DO    Brief Narrative:    76 y.o. female with medical history significant for prior CVA's on DAPT with visual deficits, chronic pain of both knees on chronic opioid use, essential hypertension, hyperlipidemia, GERD, chronic anxiety/depression, who presented to [facility] ED from home via EMS due to generalized weakness and altered mental status.  Patient lives at home with her grand daughter.  She initially came in as a code stroke due to reported inability to ambulate since after 5:40 PM.     Per family at bedside she has been progressively confused for the past 2 to 3 days with associated poor oral intake and poor appetite.  No subjective fevers reported.  No GI symptoms reported.  No seizure-like activities.  In the ED work-up was negative for acute CVA post noncontrast CT head and MRI brain.  UA was positive for pyuria. Seen by neurology/stroke team who recommended admission for further work-up. Noncontrast MRI thoracic spine and lumbar spine showed no acute findings.  Ammonia was elevated and started on lactulose initially. On rocephin for UTI initially. After lengthy discussion family and patient elected to pursue hospice. Awaiting placement.    Subjective:   Looks comfortable this morning. No other acute complaints. Son at bedside   Assessment and Plan:   Assessment: UTI Acute metabolic encephalopathy AKI Generalized weakness History of prior CVAs Type 2 diabetes mellitus HFpEF Hypertension Chronic pain Hyperlipidemia Depression/anxiety GERD  Plan: Awaiting placement at facility. PRN medications ordered for comfort. Palliative following. On scheduled Ativan to help with her anxiety and scheduled morphine for pain control. Foley to gravity.   Other information:    DVT prophylaxis: None Code Status: DNR Family Communication: Son present at  bedside Disposition:   Status is: Inpatient Remains inpatient appropriate because: awaiting hospice placement       Consultants:   Palliative    Objective:    Vitals:   01/03/22 1947 01/04/22 0751 01/04/22 2119 01/05/22 0413  BP: (!) 148/64 (!) 186/76 (!) 139/47 (!) 154/65  Pulse:  69 70 70  Resp: 14 18 19 20   Temp: 98 F (36.7 C) 98.1 F (36.7 C) 98.9 F (37.2 C) 98 F (36.7 C)  TempSrc: Oral Oral Oral Oral  SpO2: 92% 91% 90% 90%  Weight:      Height:        Intake/Output Summary (Last 24 hours) at 01/05/2022 1631 Last data filed at 01/05/2022 0435 Gross per 24 hour  Intake --  Output 1325 ml  Net -1325 ml    Filed Weights   06-Jan-2022 1900 Jan 06, 2022 2007  Weight: 97.4 kg 96.4 kg       Physical Exam:    General exam: Appears comfortable  Respiratory system: Clear to auscultation. Respiratory effort normal. Cardiovascular system: S1 & S2 heard, RRR. No JVD, murmurs, rubs, gallops or clicks. No pedal edema.    Data Reviewed:    I have personally reviewed following labs and imaging studies  CBC: Recent Labs  Lab 2022/01/06 1931 01/06/2022 1937 01/03/22 0015  WBC 6.0  --  5.0  NEUTROABS 4.5  --  3.3  HGB 11.3* 12.2 10.5*  HCT 37.8 36.0 34.8*  MCV 84.2  --  84.3  PLT 181  --  181     Basic Metabolic Panel: Recent Labs  Lab 2022/01/06 1931 01/06/22 1937 01/03/22 0015  NA 141 142  143  K 3.7 3.6 3.8  CL 111 111 113*  CO2 22  --  22  GLUCOSE 197* 192* 123*  BUN 38* 37* 39*  CREATININE 1.55* 1.50* 1.60*  CALCIUM 9.0  --  8.5*  MG  --   --  2.0  PHOS  --   --  4.2     GFR: Estimated Creatinine Clearance: 36 mL/min (A) (by C-G formula based on SCr of 1.6 mg/dL (H)).  Liver Function Tests: Recent Labs  Lab 12/30/2021 1931 01/03/22 0015  AST 24 21  ALT 15 16  ALKPHOS 47 42  BILITOT 0.7 0.6  PROT 5.9* 5.6*  ALBUMIN 3.5 3.2*     CBG: Recent Labs  Lab 01/03/22 0001 01/03/22 0722 01/03/22 1118 01/03/22 1547 01/03/22 2057   GLUCAP 134* 103* 114* 104* 114*      Recent Results (from the past 240 hour(s))  Urine Culture     Status: Abnormal   Collection Time: 12/29/2021 10:48 PM   Specimen: Urine, Clean Catch  Result Value Ref Range Status   Specimen Description URINE, CLEAN CATCH  Final   Special Requests NONE  Final   Culture (A)  Final    <10,000 COLONIES/mL INSIGNIFICANT GROWTH Performed at [facility], [address] [place], [place] [address]    Report Status 01/04/2022 FINAL  Final         Radiology Studies:    No results found.      Medications:    Scheduled Meds:  Chlorhexidine Gluconate Cloth  6 each Topical Q0600   LORazepam  1 mg Intravenous Q4H    morphine injection  2 mg Intravenous Q4H   Continuous Infusions:     LOS: 3 days    Time spent: 35 minutes    Verdia Kuba, MD [facility]   To contact the attending provider between 7A-7P or the covering provider during after hours 7P-7A, please log into the web site [email or web address] and access using universal [place] password for that web site. If you do not have the password, please call the hospital operator.  01/05/2022, 4:31 PM

## 2022-01-05 NOTE — TOC Progression Note (Signed)
Transition of Care [facility]) - Progression Note    Patient Details  Name: Tara [name] MRN: [id number] Date of Birth: [birth year]  Transition of Care [facility]) CM/SW Contact  Jimmy Picket, [place] Phone Number: 01/05/2022, 4:13 PM  Clinical Narrative:     CSW spoke to pts daughter Laveda Abbe about residential hospice. Lorie stated that se does not know of any hospice agencies in her area. CSW explained how transportation works when going outside [employer] transportation radius. Lorie asks about cost with hospice. CSW explained that she can discuss with the facility. CSW called [facility] and left a message and gave them RNCM Debbie number to call back. CSW called Lorie but there was no answer.        Expected Discharge Plan and Services                                                 Social Determinants of Health (SDOH) Interventions    Readmission Risk Interventions    08/03/2019    5:36 PM  Readmission Risk Prevention Plan  Post Dischage Appt Complete  Medication Screening Complete  Transportation Screening Complete   Jimmy Picket, LCSW Clinical Social Worker

## 2022-01-06 DIAGNOSIS — Z7189 Other specified counseling: Secondary | ICD-10-CM | POA: Diagnosis not present

## 2022-01-06 DIAGNOSIS — Z515 Encounter for palliative care: Secondary | ICD-10-CM | POA: Diagnosis not present

## 2022-01-06 DIAGNOSIS — R531 Weakness: Secondary | ICD-10-CM | POA: Diagnosis not present

## 2022-01-06 MED ORDER — MORPHINE BOLUS VIA INFUSION
1.0000 mg | INTRAVENOUS | Status: DC | PRN
  Administered 2022-01-06 – 2022-01-07 (×17): 1 mg via INTRAVENOUS

## 2022-01-06 MED ORDER — MORPHINE 100MG IN NS 100ML (1MG/ML) PREMIX INFUSION
1.0000 mg/h | INTRAVENOUS | Status: DC
  Administered 2022-01-06: 1 mg/h via INTRAVENOUS
  Administered 2022-01-07: 9 mg/h via INTRAVENOUS
  Administered 2022-01-08: 10 mg/h via INTRAVENOUS
  Filled 2022-01-06 (×4): qty 100

## 2022-01-06 MED ORDER — KETOROLAC TROMETHAMINE 30 MG/ML IJ SOLN
30.0000 mg | Freq: Three times a day (TID) | INTRAMUSCULAR | Status: AC
  Administered 2022-01-06 (×3): 30 mg via INTRAVENOUS
  Filled 2022-01-06 (×3): qty 1

## 2022-01-06 NOTE — Progress Notes (Signed)
Progress Note:    Tara [name]    QBH:[id number] DOB: 1945/12/31 DOA: Jan 15, 2022  PCP: Dorcas Carrow, DO    Brief Narrative:    76 y.o. female with medical history significant for prior CVA's on DAPT with visual deficits, chronic pain of both knees on chronic opioid use, essential hypertension, hyperlipidemia, GERD, chronic anxiety/depression, who presented to [facility] ED from home via EMS due to generalized weakness and altered mental status.  Patient lives at home with her grand daughter.  She initially came in as a code stroke due to reported inability to ambulate since after 5:40 PM.     Per family at bedside she has been progressively confused for the past 2 to 3 days with associated poor oral intake and poor appetite.  No subjective fevers reported.  No GI symptoms reported.  No seizure-like activities.  In the ED work-up was negative for acute CVA post noncontrast CT head and MRI brain.  UA was positive for pyuria. Seen by neurology/stroke team who recommended admission for further work-up. Noncontrast MRI thoracic spine and lumbar spine showed no acute findings.  Ammonia was elevated and started on lactulose initially. On rocephin for UTI initially. After lengthy discussion family and patient elected to pursue hospice. Awaiting placement.    Subjective:   Looks comfortable this morning. No other acute complaints. Son at bedside   Assessment and Plan:   Assessment: UTI Acute metabolic encephalopathy AKI Generalized weakness History of prior CVAs Type 2 diabetes mellitus HFpEF Hypertension Chronic pain Hyperlipidemia Depression/anxiety GERD  Plan: Family wants patient to stay in hospital and does not want the patient to be transferred to a hospice facility. PRN medications ordered for comfort. Palliative following. On scheduled Ativan to help with her anxiety and scheduled morphine for pain control. Foley to gravity.   Other information:    DVT  prophylaxis: None Code Status: DNR Family Communication: Son present at bedside Disposition:   Status is: Inpatient Remains inpatient appropriate because: awaiting hospice placement       Consultants:   Palliative    Objective:    Vitals:   01/04/22 0751 01/04/22 2119 01/05/22 0413 01/06/22 0700  BP: (!) 186/76 (!) 139/47 (!) 154/65 (!) 157/66  Pulse: 69 70 70   Resp: 18 19 20 16   Temp: 98.1 F (36.7 C) 98.9 F (37.2 C) 98 F (36.7 C)   TempSrc: Oral Oral Oral   SpO2: 91% 90% 90%   Weight:      Height:        Intake/Output Summary (Last 24 hours) at 01/06/2022 1600 Last data filed at 01/06/2022 0600 Gross per 24 hour  Intake --  Output 2150 ml  Net -2150 ml    Filed Weights   January 15, 2022 1900 2022/01/15 2007  Weight: 97.4 kg 96.4 kg       Physical Exam:    General exam: Appears comfortable  Respiratory system: Clear to auscultation. Respiratory effort normal. Cardiovascular system: S1 & S2 heard, RRR. No JVD, murmurs, rubs, gallops or clicks. No pedal edema.    Data Reviewed:    I have personally reviewed following labs and imaging studies  CBC: Recent Labs  Lab 01-15-2022 1931 2022-01-15 1937 01/03/22 0015  WBC 6.0  --  5.0  NEUTROABS 4.5  --  3.3  HGB 11.3* 12.2 10.5*  HCT 37.8 36.0 34.8*  MCV 84.2  --  84.3  PLT 181  --  181     Basic Metabolic Panel: Recent  Labs  Lab 12/20/2021 1931 12/24/2021 1937 01/03/22 0015  NA 141 142 143  K 3.7 3.6 3.8  CL 111 111 113*  CO2 22  --  22  GLUCOSE 197* 192* 123*  BUN 38* 37* 39*  CREATININE 1.55* 1.50* 1.60*  CALCIUM 9.0  --  8.5*  MG  --   --  2.0  PHOS  --   --  4.2     GFR: Estimated Creatinine Clearance: 36 mL/min (A) (by C-G formula based on SCr of 1.6 mg/dL (H)).  Liver Function Tests: Recent Labs  Lab 01/07/2022 1931 01/03/22 0015  AST 24 21  ALT 15 16  ALKPHOS 47 42  BILITOT 0.7 0.6  PROT 5.9* 5.6*  ALBUMIN 3.5 3.2*     CBG: Recent Labs  Lab 01/03/22 0001  01/03/22 0722 01/03/22 1118 01/03/22 1547 01/03/22 2057  GLUCAP 134* 103* 114* 104* 114*      Recent Results (from the past 240 hour(s))  Urine Culture     Status: Abnormal   Collection Time: 01/13/2022 10:48 PM   Specimen: Urine, Clean Catch  Result Value Ref Range Status   Specimen Description URINE, CLEAN CATCH  Final   Special Requests NONE  Final   Culture (A)  Final    <10,000 COLONIES/mL INSIGNIFICANT GROWTH Performed at [facility], [address], [place], [place] [address]    Report Status 01/04/2022 FINAL  Final         Radiology Studies:    No results found.      Medications:    Scheduled Meds:  Chlorhexidine Gluconate Cloth  6 each Topical Q0600   ketorolac  30 mg Intravenous Q8H   LORazepam  1 mg Intravenous Q4H   Continuous Infusions:  morphine 1 mg/hr (01/06/22 1559)       LOS: 4 days    Time spent: 35 minutes    Verdia Kuba, MD [facility]   To contact the attending provider between 7A-7P or the covering provider during after hours 7P-7A, please log into the web site [email or web address] and access using universal [place] password for that web site. If you do not have the password, please call the hospital operator.  01/06/2022, 4:00 PM

## 2022-01-06 NOTE — Progress Notes (Addendum)
Palliative Medicine Inpatient Follow Up Note  HPI: Tara [name] is a 76 y.o. 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     Palliative care asked to get involved to further aid in hospice coordination.   Today's Discussion 01/06/2022  *Please note that this is a verbal dictation therefore any spelling or grammatical errors are due to the "[place] [employer]" system interpretation.  Chart reviewed inclusive of vital [name], progress notes, laboratory results, and diagnostic images.   I met this morning with patients son, Tara [name] at bedside. He expresses that for the most part the modification to medications has made a difference in terms of symptom relief. We reviewed the continuation at the present regiment and if symptom burden increased to then consider a morphine drip. At this time it does not seem to be indicated. I shared I will remain available throughout the day to continue to offer support.  IN terms of Tara [name] she is warm to the touch, still have strong distal pulses and no motteling at this point. Breathing is even and does not appear labored. _________________________ Addendum:  I stopped in this late morning with Tara [name]. We reviewed patients present clinical state. Discussed with Tara [name] that she has had more restlessness though she seems to be settling now. We reviewed that she can get her next does of medications early. Tara [name] also feel perhaps the O2 would cause some relief of symptoms for Tara [name] at a low rate so we resumed nasal cannula.   Tara [name] mentioned the idea of inpatient hospice. Patient sons is very clear about the desire to stay in house. He and his family feel that Tara [name] time will be short and they would prefer for  something to not occur in transfer.   We reviewed the ongoing support that is planned and I shared to please call me if additional care needs arise.  I was able to chat with patient RN, Tara [name] after leaving Tara [name] room to provide medications early.  ________________________ Addendum #2:  I was asked by patients RN to stop by [facility] room this afternoon. Per Tara [name] and Tara [name] daughter, Tara [name] patients symptoms have not been well controlled she is restless and grimacing. She is stating she has lower back pain. When I was at bedside she was wincing, breathing more with accessory muscles, and had furrowing of her brow. I shared that at this point we could start a low dose morphine drip to maintain control of symptoms.    Objective Assessment: Vital [name] Vitals:   01/05/22 0413 01/06/22 0700  BP: (!) 154/65 (!) 157/66  Pulse: 70   Resp: 20 16  Temp: 98 F (36.7 C)   SpO2: 90%     Intake/Output Summary (Last 24 hours) at 01/06/2022 0942 Last data filed at 01/06/2022 0600 Gross per 24 hour  Intake --  Output 2150 ml  Net -2150 ml    Last Weight  Most recent update: 01/04/2022  8:08 PM    Weight  96.4 kg (212 lb 9.6 oz)            Gen:  NAD HEENT: moist mucous membranes CV: Regular rate and rhythm PULM: 2LPM [place] breathing even and nonlabored ABD: soft/nontender EXT: No edema Neuro: Somnolent this morning    SUMMARY OF RECOMMENDATIONS  DNAR/DNI   Comfort focused care   Continue pain medications as needed and low-dose Ativan + Morphine every 6 hours to better address anxiety and generalized pain   Unrestricted visitation   Ongoing palliative care support    Anticipate in hospital death - Family does not desire transfer  Billing based on MDM: High - Opoid review and modifications at end of life.  ______________________________________________________________________________________ [facility] Team Team Cell Phone:  [phone] Please utilize secure chat with additional questions, if there is no response within 30 minutes please call the above phone number  Palliative Medicine Team providers are available by phone from 7am to 7pm daily and can be reached through the team cell phone.  Should this patient require assistance outside of these hours, please call the patient's attending physician.

## 2022-01-07 DIAGNOSIS — R0609 Other forms of dyspnea: Secondary | ICD-10-CM

## 2022-01-07 DIAGNOSIS — R531 Weakness: Secondary | ICD-10-CM | POA: Diagnosis not present

## 2022-01-07 LAB — VITAMIN B1: Vitamin B1 (Thiamine): 122 nmol/L (ref 66.5–200.0)

## 2022-01-07 NOTE — Progress Notes (Signed)
Progress Note:    Tara [name]    EXB:[id number] DOB: [birth year] DOA: 01/22/2022  PCP: Dorcas Carrow, DO    Brief Narrative:    76 y.o. female with medical history significant for prior CVA's on DAPT with visual deficits, chronic pain of both knees on chronic opioid use, essential hypertension, hyperlipidemia, GERD, chronic anxiety/depression, who presented to [facility] ED from home via EMS due to generalized weakness and altered mental status.  Patient lives at home with her grand daughter.  She initially came in as a code stroke due to reported inability to ambulate since after 5:40 PM.     Per family at bedside she has been progressively confused for the past 2 to 3 days with associated poor oral intake and poor appetite.  No subjective fevers reported.  No GI symptoms reported.  No seizure-like activities.  In the ED work-up was negative for acute CVA post noncontrast CT head and MRI brain.  UA was positive for pyuria. Seen by neurology/stroke team who recommended admission for further work-up. Noncontrast MRI thoracic spine and lumbar spine showed no acute findings.  Ammonia was elevated and started on lactulose initially. On rocephin for UTI initially. After lengthy discussion family and patient elected to pursue hospice. Awaiting placement.    Subjective:   Looks comfortable this morning. No other acute complaints.  Daughter at bedside   Assessment and Plan:   Assessment: UTI Acute metabolic encephalopathy AKI Generalized weakness History of prior CVAs Type 2 diabetes mellitus HFpEF Hypertension Chronic pain Hyperlipidemia Depression/anxiety GERD  Plan: Full comfort care. Family wants patient to stay in hospital and does not want the patient to be transferred to a hospice facility. PRN medications ordered for comfort. Palliative following. On scheduled Ativan to help with her anxiety and morphine gtt for pain control. Foley to gravity. Family satisfied with  current level of comfort. Anticipate in-hospital death   Other information:    DVT prophylaxis: None Code Status: DNR Family Communication: Son present at bedside Disposition:   Status is: Inpatient Remains inpatient appropriate because: awaiting hospice placement       Consultants:   Palliative    Objective:    Vitals:   01/04/22 2119 01/05/22 0413 01/06/22 0700 01/06/22 1935  BP: (!) 139/47 (!) 154/65 (!) 157/66 (!) 148/59  Pulse: 70 70    Resp: 19 20 16 16   Temp: 98.9 F (37.2 C) 98 F (36.7 C)  98.3 F (36.8 C)  TempSrc: Oral Oral  Axillary  SpO2: 90% 90%    Weight:      Height:        Intake/Output Summary (Last 24 hours) at 01/07/2022 1411 Last data filed at 01/07/2022 0531 Gross per 24 hour  Intake 10.48 ml  Output 350 ml  Net -339.52 ml   Filed Weights   2022-01-22 1900 Jan 22, 2022 2007  Weight: 97.4 kg 96.4 kg       Physical Exam:    General exam: Asleep and snoring    Data Reviewed:    I have personally reviewed following labs and imaging studies  CBC: Recent Labs  Lab January 22, 2022 1931 Jan 22, 2022 1937 01/03/22 0015  WBC 6.0  --  5.0  NEUTROABS 4.5  --  3.3  HGB 11.3* 12.2 10.5*  HCT 37.8 36.0 34.8*  MCV 84.2  --  84.3  PLT 181  --  181    Basic Metabolic Panel: Recent Labs  Lab 01-22-2022 1931 2022/01/22 1937 01/03/22 0015  NA 141  142 143  K 3.7 3.6 3.8  CL 111 111 113*  CO2 22  --  22  GLUCOSE 197* 192* 123*  BUN 38* 37* 39*  CREATININE 1.55* 1.50* 1.60*  CALCIUM 9.0  --  8.5*  MG  --   --  2.0  PHOS  --   --  4.2    GFR: Estimated Creatinine Clearance: 36 mL/min (A) (by C-G formula based on SCr of 1.6 mg/dL (H)).  Liver Function Tests: Recent Labs  Lab 12/23/2021 1931 01/03/22 0015  AST 24 21  ALT 15 16  ALKPHOS 47 42  BILITOT 0.7 0.6  PROT 5.9* 5.6*  ALBUMIN 3.5 3.2*    CBG: Recent Labs  Lab 01/03/22 0001 01/03/22 0722 01/03/22 1118 01/03/22 1547 01/03/22 2057  GLUCAP 134* 103* 114* 104* 114*      Recent Results (from the past 240 hour(s))  Urine Culture     Status: Abnormal   Collection Time: 12/28/2021 10:48 PM   Specimen: Urine, Clean Catch  Result Value Ref Range Status   Specimen Description URINE, CLEAN CATCH  Final   Special Requests NONE  Final   Culture (A)  Final    <10,000 COLONIES/mL INSIGNIFICANT GROWTH Performed at [facility], [address] [place], [place] [address]    Report Status 01/04/2022 FINAL  Final         Radiology Studies:    No results found.      Medications:    Scheduled Meds:  Chlorhexidine Gluconate Cloth  6 each Topical Q0600   LORazepam  1 mg Intravenous Q4H   Continuous Infusions:  morphine 6 mg/hr (01/07/22 1315)       LOS: 5 days    Time spent: 25 minutes    Silvano Bilis, MD [facility]   To contact the attending provider between 7A-7P or the covering provider during after hours 7P-7A, please log into the web site [email or web address] and access using universal [place] password for that web site. If you do not have the password, please call the hospital operator.  01/07/2022, 2:11 PM

## 2022-01-07 NOTE — Progress Notes (Signed)
Patient ID: [name], female   DOB: [birth year], 76 y.o.   MRN: [id number]    Progress Note from the Palliative Medicine Team at [facility]   Patient Name: Tara [name]        Date: 01/07/2022 DOB: [birth year]  Age: 77 y.o. MRN#: [id number] Attending Physician: Kathrynn Running, MD Primary Care Physician: Dorcas Carrow, DO Admit Date: 2022/01/22   Medical records reviewed   Tara [name] is a 76 y.o. 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   This NP visited patient at the bedside as a follow up .  Patient is comfortable she continues on a continuous morphine drip.  Family support comfort approach and understand the limited prognosis.  Education offered on the natural trajectory and expectations at end-of-life   Plan of Care:   Focus of care is comfort and dignity  -DNR/DNI - symptom management:       -dyspnea/pain- continue morphine gtt with bolus         -agitation- ativan 1 mg  -Anticipate hospital death   PMT will continue to support holistically  Questions and concerns addressed   Discussed with bedside RN   Lorinda Creed NP  Palliative Medicine Team Team Phone # [phone] Pager [phone]

## 2022-01-08 DIAGNOSIS — N3000 Acute cystitis without hematuria: Secondary | ICD-10-CM | POA: Diagnosis not present

## 2022-01-08 DIAGNOSIS — R627 Adult failure to thrive: Secondary | ICD-10-CM

## 2022-01-08 DIAGNOSIS — R531 Weakness: Secondary | ICD-10-CM | POA: Diagnosis not present

## 2022-01-08 DIAGNOSIS — G9341 Metabolic encephalopathy: Secondary | ICD-10-CM

## 2022-01-19 NOTE — Discharge Summary (Signed)
Death Summary  Tara [name] GMW:[id number] DOB: 09/29/1945 DOA: 05-Jan-2022  PCP: Dorcas Carrow, DO  Admit date: 2022/01/05 Date of Death: 01-11-2022 Time of Death: October 28, 1056 Notification: Family notified of death   History of present illness:   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  Patient lives at home with her granddaughter.  She initially came in as a code stroke due to reported inability to ambulate.    Per family at bedside she became progressively confused for 2 to 3 days with associated poor oral intake and poor appetite.  No subjective fevers reported.  No GI symptoms reported.  No seizure-like activities.  In the ED work-up was negative for acute CVA post noncontrast CT head and MRI brain.  UA was positive for pyuria. Seen by neurology/stroke team who recommended admission for further work-up. Noncontrast MRI thoracic spine and lumbar spine showed no acute findings.  Ammonia was elevated and started on lactulose initially. On rocephin for UTI initially. After lengthy discussion family and patient elected to pursue hospice and comfort care management.  She was also followed by palliative care during hospitalization.  She was transitioned to a morphine drip for further comfort control.  She continued to decline and passed naturally on Jan 11, 2022 at 1058.   Final Diagnoses:  Failure to thrive in adult Acute metabolic encephalopathy Acute cystitis    The results of significant diagnostics from this hospitalization (including imaging, microbiology, ancillary and laboratory) are listed below for reference.    Significant Diagnostic Studies: US Abdomen Limited RUQ (LIVER/GB)  Result Date:  01/03/2022 CLINICAL DATA:  Abdominal pain EXAM: ULTRASOUND ABDOMEN LIMITED RIGHT UPPER QUADRANT COMPARISON:  None Available. FINDINGS: Gallbladder: Gallbladder is not seen consistent with cholecystectomy. Common bile duct: Diameter: 8.7 mm Liver: There is increased echogenicity suggesting fatty infiltration. No focal abnormality is seen in the visualized portions of liver. Portal vein is patent on color Doppler imaging with normal direction of blood flow towards the liver. Other: None. IMPRESSION: Fatty liver. Status post cholecystectomy. No other sonographic abnormality is seen in the right upper quadrant. Electronically Signed   By: Ernie Avena M.D.   On: 01/03/2022 10:17   EEG adult  Result Date: 01/03/2022 Charlsie Quest, MD     01/03/2022  8:24 AM Patient Name: Tara [name] MRN: [id number] Epilepsy Attending: Charlsie Quest Referring Physician/Provider: Darlin Drop, DO Date: 01/05/2022 Duration: 21.34 mins Patient history: 76 year old female with altered mental status.  EEG to evaluate for seizure. Level of alertness: Awake AEDs during EEG study: None Technical aspects: This EEG study was done with scalp electrodes positioned according to the 10-20 International system of electrode placement. Electrical activity was acquired at a sampling rate of 500Hz  and reviewed with a high frequency filter of 70Hz  and a low frequency filter of 1Hz . EEG data were recorded continuously and digitally stored. Description: No clear posterior dominant rhythm was seen. EEG showed continuous generalized 3 to 6 Hz theta- delta slowing.  Hyperventilation and photic stimulation were not performed.   ABNORMALITY - Continuous slow, generalized IMPRESSION: This study is suggestive of moderate diffuse encephalopathy, nonspecific etiology. No seizures or epileptiform discharges were seen throughout the recording. [name]   MR LUMBAR SPINE WO CONTRAST  Result  Date: 01/03/2022 CLINICAL DATA:  Initial  evaluation for acute encephalopathy, generalized weakness. EXAM: MRI LUMBAR SPINE WITHOUT CONTRAST TECHNIQUE: Multiplanar, multisequence MR imaging of the lumbar spine was performed. No intravenous contrast was administered. COMPARISON:  Prior MRI from 01/08/2021. FINDINGS: Segmentation: Examination is severely limited as the patient was unable to tolerate the full length of the exam. Sagittal T2 and STIR sequences only were performed. Additionally, the provided images are severely degraded by motion artifact. 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 Same numbering system employed as on previous exam. Alignment: Physiologic with preservation of the normal lumbar lordosis. No interval listhesis or malalignment. Vertebrae: Vertebral body height maintained without acute or chronic fracture. Bone marrow signal intensity grossly within normal limits. No visible discrete osseous lesions or abnormal marrow edema. Conus medullaris and cauda equina: Conus extends to the L1 level. Evaluation of the conus and cauda equina fairly limited on this exam. No obvious abnormality. Paraspinal and other soft tissues: Partially visualized paraspinous soft tissues demonstrate no obvious abnormality. Disc levels: Multilevel degenerative disc disease and facet hypertrophy, most pronounced at L2-3 and L3-4, grossly similar as compared to previous exam. Associated mild-to-moderate spinal stenosis at L2-3 and L3-4, grossly similar. No appreciable progressive stenosis on this limited exam. Evaluation for foraminal encroachment limited on this examination. IMPRESSION: 1. Severely limited exam due to the patient's inability to tolerate the full length of the exam and extensive motion. 2. No definite acute abnormality. Grossly stable appearance of multilevel degenerative spondylosis with resultant mild to moderate spinal stenosis at L2-3 and L3-4. 3. Transitional lumbosacral  anatomy with sacralization of the L5 vertebral body. If there remains clinical concern for possible occult pathology, a repeat examination when the patient is able to tolerate the exam would be recommended. Electronically Signed   By: Rise Mu M.D.   On: 01/03/2022 04:31   MR THORACIC SPINE WO CONTRAST  Result Date: 01/03/2022 CLINICAL DATA:  Initial evaluation for acute encephalopathy with generalized weakness. EXAM: MRI THORACIC SPINE WITHOUT CONTRAST TECHNIQUE: Multiplanar, multisequence MR imaging of the thoracic spine was performed. No intravenous contrast was administered. COMPARISON:  Previous exam from 01/08/2021. FINDINGS: Alignment: Examination is technically limited as the patient was unable to tolerate the full length of the exam. No counter sequence is provided, and vertebral body count is made in comparison with previous exam and prior cervical spine MRI from 01-23-2022. Additionally, axial GRE sequence was not performed either. The images at are provided are moderately degraded by motion. Mild exaggeration of the normal thoracic kyphosis. No significant listhesis. Vertebrae: Vertebral body height maintained without acute or chronic fracture. Bone marrow signal intensity within normal limits. Small benign hemangioma noted within the T11 vertebral body. No worrisome osseous lesions. No abnormal marrow edema. Cord: Grossly normal signal and morphology. No appreciable cord signal changes on this motion degraded and limited exam. Paraspinal and other soft tissues: Unremarkable. Disc levels: T1-2: Unremarkable. T2-3: Negative interspace. Mild left-sided facet hypertrophy. No stenosis. T3-4: Unremarkable. T4-5: Unremarkable. T5-6: Small right paracentral disc protrusion indents the ventral thecal sac. No significant spinal stenosis or cord deformity. Foramina remain patent. T6-7: Disc bulge with small right paracentral disc protrusion. Reactive endplate spurring. No significant spinal  stenosis. Foramina remain patent. T7-8: Left paracentral disc protrusion indents the left ventral thecal sac, contacting and minimally flattening the left ventral cord. No cord signal changes. No significant spinal stenosis. Foramina remain patent. T8-9: Small left paracentral disc protrusion indents the left ventral thecal  sac. Minimal flattening of the left ventral cord without cord signal changes or significant spinal stenosis. Foramina remain patent. T9-10: Chronic endplate Schmorl's node deformity without significant disc bulge. No stenosis. T10-11: Disc bulge with small central disc protrusion. Mild facet hypertrophy. No significant spinal stenosis. Foramina remain patent. T11-12: Unremarkable. T12-L1: Seen only on sagittal projection. Small central disc protrusion without significant spinal stenosis. Foramina remain patent. IMPRESSION: 1. Technically limited exam due to the patient's inability to tolerate the full length of the study and motion artifact. 2. Grossly normal MRI appearance of the thoracic spinal cord. 3. Small disc protrusions at T5-6 through T10-11 as above. No significant spinal stenosis. Electronically Signed   By: Rise Mu M.D.   On: 01/03/2022 04:24   MR BRAIN WO CONTRAST  Result Date: 12/30/2021 CLINICAL DATA:  Initial evaluation for neuro deficit, stroke suspected. EXAM: MRI HEAD WITHOUT CONTRAST TECHNIQUE: Multiplanar, multiecho pulse sequences of the brain and surrounding structures were obtained without intravenous contrast. COMPARISON:  Prior CT from earlier the same day. FINDINGS: Brain: Generalized age-related cerebral atrophy. Patchy and confluent T2/FLAIR hyperintensity involving the periventricular deep white matter both cerebral hemispheres, most consistent with chronic small vessel ischemic disease, moderately advanced in nature. Associated scattered remote lacunar infarcts about the deep gray nuclei. Multifocal areas of encephalomalacia and gliosis involving  the anterior right frontal lobe, parasagittal posterior left frontal lobe, and bilateral parieto-occipital regions, consistent with chronic ischemic infarcts. Few additional small remote bilateral cerebellar infarcts. Scattered chronic blood products noted about a few of these chronic ischemic changes. No abnormal foci of restricted diffusion to suggest acute or subacute ischemia. Gray-white matter differentiation maintained. No other acute or chronic intracranial blood products. No mass lesion, midline shift or mass effect. No hydrocephalus or extra-axial fluid collection. Pituitary gland suprasellar region within normal limits. Vascular: Major intracranial vascular flow voids are maintained. Skull and upper cervical spine: Craniocervical junction with normal limits. Bone marrow signal intensity normal. No scalp soft tissue abnormality. Sinuses/Orbits: Prior bilateral ocular lens replacement. Paranasal sinuses and mastoid air cells are largely clear. Other: None. IMPRESSION: 1. No acute intracranial abnormality. 2. Underlying age-related cerebral atrophy with moderate chronic small vessel ischemic disease, with multiple remote ischemic infarcts as above. Electronically Signed   By: Rise Mu M.D.   On: 01/13/2022 22:54   MR Cervical Spine Wo Contrast  Result Date: 01/07/2022 CLINICAL DATA:  Initial evaluation for non accidental trauma suspected, abnormal neuro exam. EXAM: MRI CERVICAL SPINE WITHOUT CONTRAST TECHNIQUE: Multiplanar, multisequence MR imaging of the cervical spine was performed. No intravenous contrast was administered. COMPARISON:  None available. FINDINGS: Alignment: Examination degraded by motion artifact. Straightening with mild reversal of the normal cervical lordosis. Trace anterolisthesis of C3 on C4 and C4 on C5, likely chronic and degenerative. Vertebrae: Vertebral body height maintained without acute or chronic fracture. Bone marrow signal intensity within normal limits. No  discrete or worrisome osseous lesions. Minimal reactive marrow edema present about the right C3-4 facet due to facet arthritis. Mild discogenic reactive endplate change present about the C5-6 interspace. No other abnormal marrow edema. Cord: Normal signal morphology. Posterior Fossa, vertebral arteries, paraspinal tissues: Atrophy with chronic ischemic changes noted within the partially visualized brain. Paraspinous soft tissues within normal limits. Right vertebral artery hypoplastic and not well seen, possibly occluded within the neck. Normal preserved flow voids seen within the dominant left vertebral artery. Disc levels: C2-C3: Small central disc protrusion indents the ventral thecal sac. Moderate left-sided facet hypertrophy. No spinal stenosis. Mild left  C3 foraminal narrowing. Right neural foramen remains patent. C3-C4: Trace anterolisthesis. Broad-based right paracentral disc protrusion flattens and indents the right ventral thecal sac (series 5, image 32). Resultant mild spinal stenosis. Superimposed right worse than left uncovertebral and facet hypertrophy. Moderate right C4 foraminal stenosis. Left neural foramen remains patent. C4-C5: Degenerative intervertebral disc space narrowing. Right paracentral disc osteophyte complex flattens and indents the ventral thecal sac. Mild cord flattening without cord signal changes. Mild to moderate spinal stenosis. Superimposed right greater than left uncovertebral and facet hypertrophy with resultant moderate right C5 foraminal stenosis. Left neural foramina remains grossly patent. C5-C6: Degenerative intervertebral disc space narrowing with circumferential disc osteophyte complex. Broad posterior component indents and flattens the ventral thecal sac. Moderate spinal stenosis with mild cord flattening but no cord signal changes. Severe right with moderate left C6 foraminal narrowing. C6-C7: Degenerative intervertebral disc space narrowing. Right paracentral disc  osteophyte complex indents and partially effaces the ventral thecal sac. Mild cord flattening without cord signal changes. Mild spinal stenosis. Moderate right worse than left C7 foraminal narrowing. C7-T1: Negative interspace. Mild facet hypertrophy. No canal or foraminal stenosis. Visualized upper thoracic spine demonstrates no significant finding. IMPRESSION: 1. No acute abnormality within the cervical spine. 2. Multilevel cervical spondylosis with resultant mild to moderate diffuse spinal stenosis at C3-4 through C6-7. 3. Multifactorial degenerative changes with resultant multilevel foraminal narrowing as above. Notable findings include moderate right C4 and C5 foraminal stenosis, severe right with moderate left C6 foraminal narrowing, with moderate right worse than left C7 foraminal stenosis. 4. 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 Preserved flow void within the dominant left vertebral artery. Electronically Signed   By: Rise MuBenjamin  McClintock M.D.   On: 01/17/2022 22:40   CT HEAD CODE STROKE WO CONTRAST  Result Date: 01/01/2022 CLINICAL DATA:  Code stroke. Provided history: Neuro deficit, acute, stroke suspected. EXAM: CT HEAD WITHOUT CONTRAST TECHNIQUE: Contiguous axial images were obtained from the base of the skull through the vertex without intravenous contrast. RADIATION DOSE REDUCTION: This exam was performed according to the departmental dose-optimization program which includes automated exposure control, adjustment of the mA and/or kV according to patient size and/or use of iterative reconstruction technique. COMPARISON:  Brain MRI 01/07/2021. head CT 11/02/2021. FINDINGS: Brain: Mild generalized parenchymal atrophy. Redemonstrated chronic cortical/subcortical infarcts within the posteromedial left frontal lobe and bilateral parietooccipital lobes. Chronic lacunar infarcts within the bilateral basal ganglia, thalami and cerebellar  hemispheres, some of which were better appreciated on the prior brain MRI of 01/07/2021. Background mild patchy and ill-defined hypoattenuation within the cerebral white matter, nonspecific but compatible with chronic small vessel ischemic disease. There is no acute intracranial hemorrhage. No acute demarcated cortical infarct. No extra-axial fluid collection. No evidence of an intracranial mass. No midline shift. Vascular: No hyperdense vessel. Atherosclerotic calcifications. Skull: No fracture or aggressive osseous lesion. Sinuses/Orbits: No mass or acute finding within the imaged orbits. Minimal mucosal thickening within the bilateral ethmoid and right maxillary sinuses. ASPECTS [facility]([place] Stroke Program Early CT Score) - Ganglionic level infarction (caudate, lentiform nuclei, internal capsule, insula, M1-M3 cortex): 7 - Supraganglionic infarction (M4-M6 cortex): 3 Total score (0-10 with 10 being normal): 10 (when discounting chronic infarcts). These results were communicated to Dr. Otelia LimesLindzen at 8:01 pmon 6/14/2023by text page via the [facility] messaging system. IMPRESSION: No evidence of acute intracranial abnormality. Parenchymal atrophy, chronic small vessel ischemic disease and multiple chronic infarcts as described and not appreciably changed from the prior brain MRI of  01/07/2021. Electronically Signed   By: Jackey Loge D.O.   On: 20-Jan-2022 20:02    Microbiology: Recent Results (from the past 240 hour(s))  Urine Culture     Status: Abnormal   Collection Time: 2022/01/20 10:48 PM   Specimen: Urine, Clean Catch  Result Value Ref Range Status   Specimen Description URINE, CLEAN CATCH  Final   Special Requests NONE  Final   Culture (A)  Final    <10,000 COLONIES/mL INSIGNIFICANT GROWTH Performed at [facility], [address] [place], [place] [address]    Report Status 01/04/2022 FINAL  Final     Labs: Basic Metabolic Panel: Recent Labs  Lab 2022/01/20 1931 Jan 20, 2022 1937 01/03/22 0015  NA  141 142 143  K 3.7 3.6 3.8  CL 111 111 113*  CO2 22  --  22  GLUCOSE 197* 192* 123*  BUN 38* 37* 39*  CREATININE 1.55* 1.50* 1.60*  CALCIUM 9.0  --  8.5*  MG  --   --  2.0  PHOS  --   --  4.2   Liver Function Tests: Recent Labs  Lab 2022/01/20 1931 01/03/22 0015  AST 24 21  ALT 15 16  ALKPHOS 47 42  BILITOT 0.7 0.6  PROT 5.9* 5.6*  ALBUMIN 3.5 3.2*   No results for input(s): "LIPASE", "AMYLASE" in the last 168 hours. Recent Labs  Lab 01/03/22 0134  AMMONIA 45*   CBC: Recent Labs  Lab January 20, 2022 1931 Jan 20, 2022 1937 01/03/22 0015  WBC 6.0  --  5.0  NEUTROABS 4.5  --  3.3  HGB 11.3* 12.2 10.5*  HCT 37.8 36.0 34.8*  MCV 84.2  --  84.3  PLT 181  --  181   Cardiac Enzymes: No results for input(s): "CKTOTAL", "CKMB", "CKMBINDEX", "TROPONINI" in the last 168 hours. D-Dimer No results for input(s): "DDIMER" in the last 72 hours. BNP: Invalid input(s): "POCBNP" CBG: Recent Labs  Lab 01/03/22 0001 01/03/22 0722 01/03/22 1118 01/03/22 1547 01/03/22 2057  GLUCAP 134* 103* 114* 104* 114*   Anemia work up No results for input(s): "VITAMINB12", "FOLATE", "FERRITIN", "TIBC", "IRON", "RETICCTPCT" in the last 72 hours. Urinalysis    Component Value Date/Time   COLORURINE YELLOW 20-Jan-2022 2248   APPEARANCEUR HAZY (A) 01-20-22 2248   APPEARANCEUR Clear 08/14/2021 1405   LABSPEC 1.025 2022/01/20 2248   PHURINE 5.0 2022/01/20 2248   GLUCOSEU NEGATIVE Jan 20, 2022 2248   HGBUR NEGATIVE 01-20-2022 2248   BILIRUBINUR NEGATIVE 01-20-2022 2248   BILIRUBINUR Negative 08/14/2021 1405   KETONESUR NEGATIVE 20-Jan-2022 2248   PROTEINUR 30 (A) 01/20/2022 2248   NITRITE NEGATIVE 2022-01-20 2248   LEUKOCYTESUR LARGE (A) 2022/01/20 2248   Sepsis Labs Recent Labs  Lab 01-20-2022 1931 01/03/22 0015  WBC 6.0 5.0       SIGNED:  Lewie Chamber, MD  [facility] 01/04/2022, 2:58 PM

## 2022-01-19 DEATH — deceased
